# Patient Record
Sex: Female | Born: 1955 | Race: White | Hispanic: No | Marital: Married | Smoking: Former smoker
Health system: Southern US, Community
[De-identification: ages and names within clinical notes are randomized; demographics above are authoritative.]

## PROBLEM LIST (undated history)

## (undated) DIAGNOSIS — A498 Other bacterial infections of unspecified site: Secondary | ICD-10-CM

## (undated) DIAGNOSIS — F32A Depression, unspecified: Secondary | ICD-10-CM

## (undated) DIAGNOSIS — E7800 Pure hypercholesterolemia, unspecified: Secondary | ICD-10-CM

## (undated) DIAGNOSIS — A0472 Enterocolitis due to Clostridium difficile, not specified as recurrent: Secondary | ICD-10-CM

## (undated) DIAGNOSIS — K279 Peptic ulcer, site unspecified, unspecified as acute or chronic, without hemorrhage or perforation: Secondary | ICD-10-CM

## (undated) DIAGNOSIS — K259 Gastric ulcer, unspecified as acute or chronic, without hemorrhage or perforation: Secondary | ICD-10-CM

## (undated) DIAGNOSIS — D696 Thrombocytopenia, unspecified: Secondary | ICD-10-CM

## (undated) DIAGNOSIS — F410 Panic disorder [episodic paroxysmal anxiety] without agoraphobia: Secondary | ICD-10-CM

## (undated) DIAGNOSIS — K219 Gastro-esophageal reflux disease without esophagitis: Secondary | ICD-10-CM

## (undated) DIAGNOSIS — F329 Major depressive disorder, single episode, unspecified: Secondary | ICD-10-CM

## (undated) DIAGNOSIS — K311 Adult hypertrophic pyloric stenosis: Secondary | ICD-10-CM

## (undated) DIAGNOSIS — K5790 Diverticulosis of intestine, part unspecified, without perforation or abscess without bleeding: Secondary | ICD-10-CM

## (undated) DIAGNOSIS — E669 Obesity, unspecified: Secondary | ICD-10-CM

## (undated) HISTORY — DX: Depression, unspecified: F32.A

## (undated) HISTORY — DX: Panic disorder (episodic paroxysmal anxiety): F41.0

## (undated) HISTORY — DX: Obesity, unspecified: E66.9

## (undated) HISTORY — PX: COLONOSCOPY: SHX174

## (undated) HISTORY — DX: Other bacterial infections of unspecified site: A49.8

## (undated) HISTORY — DX: Gastric ulcer, unspecified as acute or chronic, without hemorrhage or perforation: K25.9

## (undated) HISTORY — DX: Gastro-esophageal reflux disease without esophagitis: K21.9

## (undated) HISTORY — DX: Adult hypertrophic pyloric stenosis: K31.1

## (undated) HISTORY — DX: Pure hypercholesterolemia, unspecified: E78.00

## (undated) HISTORY — DX: Diverticulosis of intestine, part unspecified, without perforation or abscess without bleeding: K57.90

## (undated) HISTORY — PX: RHINOPLASTY: SHX2354

## (undated) HISTORY — DX: Major depressive disorder, single episode, unspecified: F32.9

---

## 2004-07-09 LAB — CONVERTED CEMR LAB: Pap Smear: NORMAL

## 2004-08-30 ENCOUNTER — Ambulatory Visit: Payer: Self-pay | Admitting: Internal Medicine

## 2005-09-17 ENCOUNTER — Ambulatory Visit: Payer: Self-pay | Admitting: Internal Medicine

## 2005-09-26 ENCOUNTER — Ambulatory Visit: Payer: Self-pay | Admitting: Internal Medicine

## 2006-03-28 ENCOUNTER — Ambulatory Visit: Payer: Self-pay | Admitting: Internal Medicine

## 2006-07-30 ENCOUNTER — Ambulatory Visit: Payer: Self-pay | Admitting: Family Medicine

## 2006-07-30 DIAGNOSIS — J309 Allergic rhinitis, unspecified: Secondary | ICD-10-CM | POA: Insufficient documentation

## 2006-07-30 DIAGNOSIS — Z720 Tobacco use: Secondary | ICD-10-CM | POA: Insufficient documentation

## 2006-07-30 DIAGNOSIS — F419 Anxiety disorder, unspecified: Secondary | ICD-10-CM | POA: Insufficient documentation

## 2006-07-30 DIAGNOSIS — F329 Major depressive disorder, single episode, unspecified: Secondary | ICD-10-CM | POA: Insufficient documentation

## 2006-07-30 DIAGNOSIS — F17200 Nicotine dependence, unspecified, uncomplicated: Secondary | ICD-10-CM | POA: Insufficient documentation

## 2006-07-30 DIAGNOSIS — E785 Hyperlipidemia, unspecified: Secondary | ICD-10-CM | POA: Insufficient documentation

## 2006-07-30 LAB — CONVERTED CEMR LAB
Cholesterol, target level: 200 mg/dL
HDL goal, serum: 40 mg/dL
LDL Goal: 160 mg/dL

## 2006-08-01 ENCOUNTER — Encounter: Payer: Self-pay | Admitting: Family Medicine

## 2006-08-01 LAB — CONVERTED CEMR LAB
ALT: 32 units/L (ref 0–40)
AST: 28 units/L (ref 0–37)
Alkaline Phosphatase: 85 units/L (ref 39–117)
BUN: 8 mg/dL (ref 6–23)
CO2: 31 meq/L (ref 19–32)
Calcium: 9.6 mg/dL (ref 8.4–10.5)
Chloride: 106 meq/L (ref 96–112)
Creatinine, Ser: 0.8 mg/dL (ref 0.4–1.2)
GFR calc Af Amer: 98 mL/min
Total Protein: 6.5 g/dL (ref 6.0–8.3)
Triglycerides: 218 mg/dL (ref 0–149)

## 2006-09-30 ENCOUNTER — Telehealth (INDEPENDENT_AMBULATORY_CARE_PROVIDER_SITE_OTHER): Payer: Self-pay | Admitting: *Deleted

## 2006-10-14 ENCOUNTER — Encounter: Payer: Self-pay | Admitting: Family Medicine

## 2006-10-14 DIAGNOSIS — M949 Disorder of cartilage, unspecified: Secondary | ICD-10-CM

## 2006-10-14 DIAGNOSIS — M899 Disorder of bone, unspecified: Secondary | ICD-10-CM | POA: Insufficient documentation

## 2006-11-17 ENCOUNTER — Other Ambulatory Visit: Admission: RE | Admit: 2006-11-17 | Discharge: 2006-11-17 | Payer: Self-pay | Admitting: Family Medicine

## 2006-11-17 ENCOUNTER — Encounter: Payer: Self-pay | Admitting: Family Medicine

## 2006-11-17 ENCOUNTER — Ambulatory Visit: Payer: Self-pay | Admitting: Family Medicine

## 2006-11-18 LAB — CONVERTED CEMR LAB: Pap Smear: NORMAL

## 2006-11-21 ENCOUNTER — Encounter (INDEPENDENT_AMBULATORY_CARE_PROVIDER_SITE_OTHER): Payer: Self-pay | Admitting: *Deleted

## 2006-11-26 ENCOUNTER — Ambulatory Visit: Payer: Self-pay | Admitting: Obstetrics & Gynecology

## 2006-12-18 ENCOUNTER — Encounter: Payer: Self-pay | Admitting: Family Medicine

## 2006-12-18 ENCOUNTER — Ambulatory Visit: Payer: Self-pay | Admitting: Family Medicine

## 2006-12-22 ENCOUNTER — Encounter (INDEPENDENT_AMBULATORY_CARE_PROVIDER_SITE_OTHER): Payer: Self-pay | Admitting: *Deleted

## 2007-01-20 ENCOUNTER — Telehealth: Payer: Self-pay | Admitting: Family Medicine

## 2007-01-21 ENCOUNTER — Telehealth: Payer: Self-pay | Admitting: Family Medicine

## 2007-05-18 ENCOUNTER — Ambulatory Visit: Payer: Self-pay | Admitting: Family Medicine

## 2007-05-22 LAB — CONVERTED CEMR LAB
Bilirubin, Direct: 0.1 mg/dL (ref 0.0–0.3)
Calcium: 9.4 mg/dL (ref 8.4–10.5)
Cholesterol: 163 mg/dL (ref 0–200)
GFR calc Af Amer: 98 mL/min
GFR calc non Af Amer: 81 mL/min
Glucose, Bld: 87 mg/dL (ref 70–99)
LDL Cholesterol: 91 mg/dL (ref 0–99)
Sodium: 142 meq/L (ref 135–145)
Total CHOL/HDL Ratio: 4.9

## 2007-06-30 ENCOUNTER — Ambulatory Visit: Payer: Self-pay | Admitting: Family Medicine

## 2007-09-14 ENCOUNTER — Ambulatory Visit: Payer: Self-pay | Admitting: Family Medicine

## 2007-11-23 ENCOUNTER — Ambulatory Visit: Payer: Self-pay | Admitting: Family Medicine

## 2007-11-23 LAB — CONVERTED CEMR LAB: LDL Goal: 130 mg/dL

## 2007-12-08 ENCOUNTER — Ambulatory Visit: Payer: Self-pay | Admitting: Family Medicine

## 2007-12-21 ENCOUNTER — Encounter: Payer: Self-pay | Admitting: Family Medicine

## 2007-12-21 ENCOUNTER — Ambulatory Visit: Payer: Self-pay | Admitting: Family Medicine

## 2007-12-21 ENCOUNTER — Telehealth: Payer: Self-pay | Admitting: Family Medicine

## 2007-12-23 ENCOUNTER — Encounter (INDEPENDENT_AMBULATORY_CARE_PROVIDER_SITE_OTHER): Payer: Self-pay | Admitting: *Deleted

## 2007-12-23 LAB — HM MAMMOGRAPHY

## 2007-12-25 ENCOUNTER — Ambulatory Visit: Payer: Self-pay | Admitting: Family Medicine

## 2007-12-28 ENCOUNTER — Encounter: Payer: Self-pay | Admitting: Family Medicine

## 2007-12-28 ENCOUNTER — Ambulatory Visit: Payer: Self-pay | Admitting: Family Medicine

## 2007-12-31 LAB — CONVERTED CEMR LAB
ALT: 26 units/L (ref 0–35)
BUN: 7 mg/dL (ref 6–23)
CO2: 31 meq/L (ref 19–32)
Calcium: 9.4 mg/dL (ref 8.4–10.5)
Creatinine, Ser: 0.7 mg/dL (ref 0.4–1.2)
Glucose, Bld: 90 mg/dL (ref 70–99)
HDL: 31.4 mg/dL — ABNORMAL LOW (ref >39.0)
Total Protein: 6.5 g/dL (ref 6.0–8.3)
Triglycerides: 199 mg/dL — ABNORMAL HIGH (ref 0–149)

## 2008-04-25 ENCOUNTER — Ambulatory Visit: Payer: Self-pay | Admitting: Family Medicine

## 2008-05-02 LAB — CONVERTED CEMR LAB
AST: 28 units/L (ref 0–37)
Cholesterol: 191 mg/dL (ref 0–200)
Triglycerides: 219 mg/dL (ref 0–149)

## 2008-08-15 ENCOUNTER — Telehealth (INDEPENDENT_AMBULATORY_CARE_PROVIDER_SITE_OTHER): Payer: Self-pay | Admitting: *Deleted

## 2008-08-16 ENCOUNTER — Ambulatory Visit: Payer: Self-pay | Admitting: Internal Medicine

## 2008-08-16 LAB — CONVERTED CEMR LAB
Bilirubin Urine: NEGATIVE
Blood in Urine, dipstick: NEGATIVE
Glucose, Urine, Semiquant: NEGATIVE
Protein, U semiquant: NEGATIVE
pH: 7.5

## 2008-08-17 ENCOUNTER — Telehealth (INDEPENDENT_AMBULATORY_CARE_PROVIDER_SITE_OTHER): Payer: Self-pay | Admitting: *Deleted

## 2008-08-17 LAB — CONVERTED CEMR LAB
ALT: 41 units/L — ABNORMAL HIGH (ref 0–35)
AST: 44 units/L — ABNORMAL HIGH (ref 0–37)
Alkaline Phosphatase: 116 units/L (ref 39–117)
Basophils Relative: 0.2 % (ref 0.0–3.0)
Bilirubin, Direct: 0.1 mg/dL (ref 0.0–0.3)
CO2: 29 meq/L (ref 19–32)
Creatinine, Ser: 0.7 mg/dL (ref 0.4–1.2)
Eosinophils Relative: 0.8 % (ref 0.0–5.0)
Lymphocytes Relative: 18.8 % (ref 12.0–46.0)
MCV: 89.5 fL (ref 78.0–100.0)
Monocytes Absolute: 1 10*3/uL (ref 0.1–1.0)
Monocytes Relative: 10.8 % (ref 3.0–12.0)
Neutrophils Relative %: 69.4 % (ref 43.0–77.0)
Phosphorus: 2.9 mg/dL (ref 2.3–4.6)
RBC: 4.22 M/uL (ref 3.87–5.11)
Sodium: 141 meq/L (ref 135–145)
Total Protein: 7.4 g/dL (ref 6.0–8.3)
WBC: 9.5 10*3/uL (ref 4.5–10.5)

## 2008-08-22 ENCOUNTER — Ambulatory Visit: Payer: Self-pay | Admitting: Internal Medicine

## 2008-08-22 DIAGNOSIS — R945 Abnormal results of liver function studies: Secondary | ICD-10-CM | POA: Insufficient documentation

## 2008-08-24 LAB — CONVERTED CEMR LAB
Hep B C IgM: NEGATIVE
Hepatitis B Surface Ag: NEGATIVE

## 2008-08-30 LAB — CONVERTED CEMR LAB
ALT: 31 units/L (ref 0–35)
Albumin: 3.1 g/dL — ABNORMAL LOW (ref 3.5–5.2)
Total Protein: 7.2 g/dL (ref 6.0–8.3)

## 2008-09-23 ENCOUNTER — Ambulatory Visit: Payer: Self-pay | Admitting: Internal Medicine

## 2008-09-27 ENCOUNTER — Telehealth: Payer: Self-pay | Admitting: Internal Medicine

## 2008-10-06 ENCOUNTER — Telehealth: Payer: Self-pay | Admitting: Internal Medicine

## 2008-10-07 ENCOUNTER — Ambulatory Visit: Payer: Self-pay | Admitting: Internal Medicine

## 2008-12-20 ENCOUNTER — Telehealth: Payer: Self-pay | Admitting: Internal Medicine

## 2008-12-26 ENCOUNTER — Telehealth: Payer: Self-pay | Admitting: Internal Medicine

## 2008-12-27 ENCOUNTER — Encounter: Payer: Self-pay | Admitting: Obstetrics & Gynecology

## 2008-12-27 ENCOUNTER — Encounter: Payer: Self-pay | Admitting: Family Medicine

## 2008-12-27 ENCOUNTER — Ambulatory Visit: Payer: Self-pay | Admitting: Obstetrics & Gynecology

## 2009-01-04 ENCOUNTER — Encounter (INDEPENDENT_AMBULATORY_CARE_PROVIDER_SITE_OTHER): Payer: Self-pay | Admitting: *Deleted

## 2009-01-26 ENCOUNTER — Ambulatory Visit: Payer: Self-pay | Admitting: Obstetrics & Gynecology

## 2009-02-22 ENCOUNTER — Ambulatory Visit: Payer: Self-pay | Admitting: Internal Medicine

## 2009-02-22 DIAGNOSIS — K219 Gastro-esophageal reflux disease without esophagitis: Secondary | ICD-10-CM | POA: Insufficient documentation

## 2009-02-24 LAB — CONVERTED CEMR LAB
Albumin: 3.9 g/dL (ref 3.5–5.2)
BUN: 9 mg/dL (ref 6–23)
Basophils Absolute: 0.1 10*3/uL (ref 0.0–0.1)
CO2: 27 meq/L (ref 19–32)
Chloride: 105 meq/L (ref 96–112)
Cholesterol: 192 mg/dL (ref 0–200)
Creatinine, Ser: 0.8 mg/dL (ref 0.4–1.2)
Eosinophils Absolute: 0.1 10*3/uL (ref 0.0–0.7)
HCT: 41.7 % (ref 36.0–46.0)
HDL: 36.6 mg/dL — ABNORMAL LOW (ref >39.00)
Hemoglobin: 14.3 g/dL (ref 12.0–15.0)
Lymphs Abs: 2.8 10*3/uL (ref 0.7–4.0)
MCHC: 34.2 g/dL (ref 30.0–36.0)
Monocytes Absolute: 0.6 10*3/uL (ref 0.1–1.0)
Neutro Abs: 5 10*3/uL (ref 1.4–7.7)
Platelets: 236 10*3/uL (ref 150.0–400.0)
RDW: 12.6 % (ref 11.5–14.6)
TSH: 0.87 microintl units/mL (ref 0.35–5.50)
Triglycerides: 279 mg/dL — ABNORMAL HIGH (ref 0.0–149.0)
VLDL: 55.8 mg/dL — ABNORMAL HIGH (ref 0.0–40.0)

## 2009-03-01 ENCOUNTER — Encounter (INDEPENDENT_AMBULATORY_CARE_PROVIDER_SITE_OTHER): Payer: Self-pay | Admitting: *Deleted

## 2009-03-30 ENCOUNTER — Telehealth: Payer: Self-pay | Admitting: Internal Medicine

## 2009-05-25 ENCOUNTER — Encounter (INDEPENDENT_AMBULATORY_CARE_PROVIDER_SITE_OTHER): Payer: Self-pay | Admitting: *Deleted

## 2009-05-26 ENCOUNTER — Ambulatory Visit: Payer: Self-pay | Admitting: Gastroenterology

## 2009-06-09 ENCOUNTER — Ambulatory Visit: Payer: Self-pay | Admitting: Gastroenterology

## 2009-06-09 LAB — HM COLONOSCOPY

## 2009-08-28 ENCOUNTER — Telehealth: Payer: Self-pay | Admitting: Internal Medicine

## 2009-12-07 ENCOUNTER — Ambulatory Visit: Payer: Self-pay | Admitting: Internal Medicine

## 2010-03-07 ENCOUNTER — Telehealth: Payer: Self-pay | Admitting: Internal Medicine

## 2010-04-10 NOTE — Procedures (Signed)
Summary: Colonoscopy  Patient: [name] Note: All result statuses are Final unless otherwise noted.  Tests: (1) Colonoscopy (COL)   COL Colonoscopy           DONE     [facility]     [address]     [place], [place]  [address]           COLONOSCOPY PROCEDURE REPORT           PATIENT:  Shelly Hood, Shelly Hood  MR#:  [id number]     BIRTHDATE:  December 18, 1955, 53 yrs. old  GENDER:  female           ENDOSCOPIST:  Judie Petit T. Russella Dar, MD, [facility]     Referred by:  Tillman Abide, M.D.           PROCEDURE DATE:  06/09/2009     PROCEDURE:  Colonoscopy [address]     ASA CLASS:  Class II     INDICATIONS:  1) Routine Risk Screening           MEDICATIONS:   Benadryl 25 mg IV, Fentanyl 100 mcg IV, Versed 8 mg     IV           DESCRIPTION OF PROCEDURE:   After the risks benefits and     alternatives of the procedure were thoroughly explained, informed     consent was obtained.  Digital rectal exam was performed and     revealed no abnormalities.   The LB [id number] endoscope     was introduced through the anus and advanced to the cecum, which     was identified by both the appendix and ileocecal valve, without     limitations.  The quality of the prep was excellent, using     MoviPrep.  The instrument was then slowly withdrawn as the colon     was fully examined.     <<PROCEDUREIMAGES>>           FINDINGS:  Mild diverticulosis was found in the sigmoid colon. A     normal appearing cecum, ileocecal valve, and appendiceal orifice     were identified. The ascending, hepatic flexure, transverse,     splenic flexure, descending, and rectum appeared unremarkable.     Retroflexed views in the rectum revealed no abnormalities. The     time to cecum =  3.67  minutes. The scope was then withdrawn (time     =  9  min) from the patient and the procedure completed.           COMPLICATIONS:  None           ENDOSCOPIC IMPRESSION:     1) Mild diverticulosis in the sigmoid colon        RECOMMENDATIONS:     1) High fiber diet with liberal fluid intake.     2) Continue current colorectal screening recommendations for     "routine risk" patients with a repeat colonoscopy in 10 years.           Venita Lick. Russella Dar, MD, [facility]           n.     eSIGNED:   Venita Lick. Stark at 06/09/2009 09:25 AM           Maimouna, [place], [id number]  Note: An exclamation mark (!) indicates a result that was not dispersed into the flowsheet. Document Creation Date: 06/09/2009 9:26 AM _______________________________________________________________________  (1) Order result status: Final Collection or observation date-time: 06/09/2009  09:21 Requested date-time:  Receipt date-time:  Reported date-time:  Referring Physician:   Ordering Physician: Claudette Head [phone]) Specimen Source:  Source: Launa Grill Order Number: [id number] Lab site:   Appended Document: Colonoscopy    Clinical Lists Changes  Observations: Added new observation of COLONNXTDUE: 06/2019 (06/09/2009 12:57)

## 2010-04-10 NOTE — Progress Notes (Signed)
Summary: refill request for alprazolam  Phone Note Refill Request Call back at Work Phone [phone] Message from:  Patient  Refills Requested: Medication #1:  ALPRAZOLAM 0.25 MG TABS 1 tab in AM as needed for nerves. Please send to [facility].  Initial call taken by: Lowella Petties CMA,  August 28, 2009 10:43 AM  Follow-up for Phone Call        Rx written She will need to mail it unless she has a form to fax with it Follow-up by: Cindee Salt MD,  August 28, 2009 1:08 PM  Additional Follow-up for Phone Call Additional follow up Details #1::        Spoke with patient and advised rx ready for pick-up  Additional Follow-up by: Mervin Hack CMA Duncan Dull),  August 28, 2009 3:12 PM    New/Updated Medications: ALPRAZOLAM 0.25 MG TABS (ALPRAZOLAM) 1 tab in AM as needed for nerves Prescriptions: ALPRAZOLAM 0.25 MG TABS (ALPRAZOLAM) 1 tab in AM as needed for nerves  #90 x 1   Entered and Authorized by:   Cindee Salt MD   Signed by:   Cindee Salt MD on 08/28/2009   Method used:   Print then Give to Patient   RxID:   [id number]

## 2010-04-10 NOTE — Assessment & Plan Note (Signed)
Summary: ACID REFLUX/CLE   Vital Signs:  Patient profile:   55 year old female Weight:      169 pounds BMI:     33.68 Temp:     98.4 degrees F oral Pulse rate:   60 / minute Pulse rhythm:   regular BP sitting:   104 / 64  (left arm) Cuff size:   large  Vitals Entered By: Sydell Axon LPN (December 07, 2009 2:34 PM) CC: Having acid reflux especially at night   History of Present Illness: Having more trouble with acid reflux Esp noticeable over a couple of months Notes it most at night in bed and will still bother her in the morning Actually will wake her up Has regurgitation feeling  "like something coming up" under sternum Mouth waters a lot--has to swallow  No cough No hoarseness No SOB No swallowing problems  tried pepcid and zantac without help Now on tagamet 200mg  two times a day --not helping has used omeprazole sporadically with some success  Allergies: 1)  Codeine Sulfate (Codeine Sulfate)  Past History:  Past medical, surgical, family and social histories (including risk factors) reviewed for relevance to current acute and chronic problems.  Past Medical History: Reviewed history from 02/22/2009 and no changes required. Allergic rhinitis Hyperlipidemia Anxiety Depression GERD  Past Surgical History: Reviewed history from 07/30/2006 and no changes required. 1994 rhinoplasty 2005 butterfly graft 02/2005 nasal rhinoplasty repair  MRI lumbar spine: bulging discs S/P 2 lumbar steroid injesctions one kidney smaller than the other 2007 DXA: nml  Family History: Reviewed history from 07/30/2006 and no changes required. father: died  age 52s little contact, [place] Family History of CAD Female 1st degree relative <50 mother died age 14 HTN, high chol, DM, arthritis  brother: healthy, some mental retardation  Social History: Reviewed history from 07/30/2006 and no changes required. 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   Neck:  supple, no masses, no thyromegaly, no carotid bruits, and no cervical lymphadenopathy.   Lungs:  normal respiratory effort, no intercostal retractions, no accessory muscle use, and normal breath sounds.   Heart:  normal rate, regular rhythm, no murmur, and no gallop.   Abdomen:  soft, non-tender, and no masses.   Extremities:  no edema   Impression & Recommendations:  Problem # 1:  GERD (ICD-530.81) Assessment Deteriorated  clear symptoms with insufficient Rx will have her be more aggressive with omeprazole, then back down  Her updated medication list for this problem includes:    Omeprazole 20 Mg Cpdr (Omeprazole) .Marland Kitchen... 1 by mouth two times a day as needed for acid reflux problems  Complete Medication List: 1)  Fluoxetine Hcl 40 Mg Caps (Fluoxetine hcl) .Marland Kitchen.. 1 by mouth daily 2)  Lipitor 20 Mg Tabs (Atorvastatin calcium) .Marland Kitchen.. 1 by mouth daily 3)  Alprazolam 0.25 Mg Tabs (Alprazolam) .Marland Kitchen.. 1 tab in am as needed for nerves 4)  Calcium 500 Mg Tabs (Calcium) .Marland Kitchen.. 1 by mouth daily 5)  B Complex Tabs (B complex vitamins) .Marland Kitchen.. 1 by mouth daily 6)  Omeprazole 20 Mg Cpdr (Omeprazole) .Marland Kitchen.. 1 by mouth two times a day as needed for acid reflux problems  Patient  Instructions: 1)  Please take the omeprazole 20mg  two times a day for the next 2 weeks (30 minutes before breakfast and at bedtime). If doing better  you can cut down to once at bedtime daily. After another month (6 weeks total), if your symptoms are gone, you can just use it as needed 2)  Please keep appt for physical Prescriptions: ALPRAZOLAM 0.25 MG TABS (ALPRAZOLAM) 1 tab in AM as needed for nerves  #90 x 1   Entered and Authorized by:   Cindee Salt  MD   Signed by:   Cindee Salt MD on 12/07/2009   Method used:   Print then Give to Patient   RxID:   [id number] LIPITOR 20 MG TABS (ATORVASTATIN CALCIUM) 1 by mouth daily  #90 x 3   Entered and Authorized by:   Cindee Salt MD   Signed by:   Cindee Salt MD on 12/07/2009   Method used:   Print then Give to Patient   RxID:   [id number] FLUOXETINE HCL 40 MG CAPS (FLUOXETINE HCL) 1 by mouth daily  #90 x 3   Entered and Authorized by:   Cindee Salt MD   Signed by:   Cindee Salt MD on 12/07/2009   Method used:   Print then Give to Patient   RxID:   [id number] OMEPRAZOLE 20 MG CPDR (OMEPRAZOLE) 1 by mouth two times a day as needed for acid reflux problems  #60 x 3   Entered and Authorized by:   Cindee Salt MD   Signed by:   Cindee Salt MD on 12/07/2009   Method used:   Electronically to        [facility] [address] (retail)       [address], [address]       [place], [place]  [address]       Ph: [phone]       Fax: [phone]   RxID:   [id number]   Current Allergies (reviewed today): CODEINE SULFATE (CODEINE SULFATE)

## 2010-04-10 NOTE — Miscellaneous (Signed)
Summary: [facility]  Clinical Lists Changes  Medications: Added new medication of MOVIPREP 100 GM  SOLR (PEG-KCL-NACL-NASULF-NA ASC-C) As per prep instructions. - Signed Rx of MOVIPREP 100 GM  SOLR (PEG-KCL-NACL-NASULF-NA ASC-C) As per prep instructions.;  #1 x 0;  Signed;  Entered by: Ezra Sites RN;  Authorized by: Meryl Dare MD [facility];  Method used: Electronically to [facility] [address], [place], [place], [place]  [address], Ph: [phone], Fax: [phone] Observations: Added new observation of ALLERGY REV: Done (05/26/2009 8:03)    Prescriptions: MOVIPREP 100 GM  SOLR (PEG-KCL-NACL-NASULF-NA ASC-C) As per prep instructions.  #1 x 0   Entered by:   Ezra Sites RN   Authorized by:   Meryl Dare MD [facility]   Signed by:   Ezra Sites RN on 05/26/2009   Method used:   Electronically to        [facility] [address] (retail)       [address]       [place], [place]  [address]       Ph: [phone]       Fax: [phone]   RxID:   [id number]

## 2010-04-10 NOTE — Progress Notes (Signed)
Summary: Alprazolam refill  Phone Note Refill Request Call back at [phone] Message from:  Patient on March 30, 2009 8:54 AM  Refills Requested: Medication #1:  ALPRAZOLAM 0.25 MG TABS 1 tab in AM as needed for nerves. Pt request refill for Alprazolam 0.25mg  be faxed to [facility]. Please advise.    Method Requested: Fax to Mail Away Pharmacy Initial call taken by: Lewanda Rife LPN,  March 30, 2009 8:54 AM Caller: Patient Call For: Cindee Salt MD  Follow-up for Phone Call        Rx written Follow-up by: Cindee Salt MD,  March 30, 2009 12:58 PM  Additional Follow-up for Phone Call Additional follow up Details #1::        left message at work number that rx ready for pick-up. Additional Follow-up by: Mervin Hack CMA Duncan Dull),  March 30, 2009 3:14 PM    New/Updated Medications: ALPRAZOLAM 0.25 MG TABS (ALPRAZOLAM) 1 tab in AM as needed for nerves Prescriptions: ALPRAZOLAM 0.25 MG TABS (ALPRAZOLAM) 1 tab in AM as needed for nerves  #90 x 1   Entered and Authorized by:   Cindee Salt MD   Signed by:   Cindee Salt MD on 03/30/2009   Method used:   Print then Give to Patient   RxID:   [id number]

## 2010-04-10 NOTE — Letter (Signed)
Summary: [facility] Instructions  [facility]  [address] [place], [place] [address]   Phone: [phone]  Fax: [phone]       LATRESA GASSER    [birth year]    MRN: [id number]        Procedure Day /Date:  Friday 06/09/2009     Arrival Time: 8:30 am      Procedure Time: 9:30 am     Location of Procedure:                    _x _  [facility] (4th Floor)                        PREPARATION FOR COLONOSCOPY WITH MOVIPREP   Starting 5 days prior to your procedure Sunday 3/27 do not eat nuts, seeds, popcorn, corn, beans, peas,  salads, or any raw vegetables.  Do not take any fiber supplements (e.g. Metamucil, Citrucel, and Benefiber).  THE DAY BEFORE YOUR PROCEDURE         DATE: Thursday 3/31  1.  Drink clear liquids the entire day-NO SOLID FOOD  2.  Do not drink anything colored red or purple.  Avoid juices with pulp.  No orange juice.  3.  Drink at least 64 oz. (8 glasses) of fluid/clear liquids during the day to prevent dehydration and help the prep work efficiently.  CLEAR LIQUIDS INCLUDE: Water Jello Ice Popsicles Tea (sugar ok, no milk/cream) Powdered fruit flavored drinks Coffee (sugar ok, no milk/cream) Gatorade Juice: apple, white grape, white cranberry  Lemonade Clear bullion, consomm, broth Carbonated beverages (any kind) Strained chicken noodle soup Hard Candy                             4.  In the morning, mix first dose of MoviPrep solution:    Empty 1 Pouch A and 1 Pouch B into the disposable container    Add lukewarm drinking water to the top line of the container. Mix to dissolve    Refrigerate (mixed solution should be used within 24 hrs)  5.  Begin drinking the prep at 5:00 p.m. The MoviPrep container is divided by 4 marks.   Every 15 minutes drink the solution down to the next mark (approximately 8 oz) until the full liter is complete.   6.  Follow completed prep with 16 oz of clear liquid of your choice (Nothing  red or purple).  Continue to drink clear liquids until bedtime.  7.  Before going to bed, mix second dose of MoviPrep solution:    Empty 1 Pouch A and 1 Pouch B into the disposable container    Add lukewarm drinking water to the top line of the container. Mix to dissolve    Refrigerate  THE DAY OF YOUR PROCEDURE      DATE: Friday 4/1  Beginning at 4:30 a.m. (5 hours before procedure):         1. Every 15 minutes, drink the solution down to the next mark (approx 8 oz) until the full liter is complete.  2. Follow completed prep with 16 oz. of clear liquid of your choice.    3. You may drink clear liquids until 7:30 am (2 HOURS BEFORE PROCEDURE).   MEDICATION INSTRUCTIONS  Unless otherwise instructed, you should take regular prescription medications with a small sip of water   as early as possible the morning of  your procedure.          OTHER INSTRUCTIONS  You will need a responsible adult at least 53 years of age to accompany you and drive you home.   This person must remain in the waiting room during your procedure.  Wear loose fitting clothing that is easily removed.  Leave jewelry and other valuables at home.  However, you may wish to bring a book to read or  an iPod/MP3 player to listen to music as you wait for your procedure to start.  Remove all body piercing jewelry and leave at home.  Total time from sign-in until discharge is approximately 2-3 hours.  You should go home directly after your procedure and rest.  You can resume normal activities the  day after your procedure.  The day of your procedure you should not:   Drive   Make legal decisions   Operate machinery   Drink alcohol   Return to work  You will receive specific instructions about eating, activities and medications before you leave.    The above instructions have been reviewed and explained to me by   Ezra Sites RN  May 26, 2009 8:40 AM     I fully understand and can verbalize  these instructions _____________________________ Date _________

## 2010-04-12 NOTE — Progress Notes (Signed)
Summary: omeprazole   Phone Note Refill Request Call back at Work Phone [phone] Message from:  Patient on March 07, 2010 3:13 PM  Refills Requested: Medication #1:  OMEPRAZOLE 20 MG CPDR 1 by mouth two times a day as needed for acid reflux problems. Patient is asking for a written script so that she can go through mail order pharmacy. She says that she still has 3 refill remaining, but doesn[facility] because [facility] too [facility] and she is new to [facility]. Please call patient when rx is ready.  Initial call taken by: Melody Comas,  March 07, 2010 3:15 PM  Follow-up for Phone Call        Rx written Follow-up by: Cindee Salt MD,  March 08, 2010 1:13 PM  Additional Follow-up for Phone Call Additional follow up Details #1::        Spoke with patient and advised rx ready for pick-up  Additional Follow-up by: Mervin Hack CMA Duncan Dull),  March 08, 2010 1:28 PM    Prescriptions: OMEPRAZOLE 20 MG CPDR (OMEPRAZOLE) 1 by mouth two times a day as needed for acid reflux problems  #180 x 3   Entered and Authorized by:   Cindee Salt MD   Signed by:   Cindee Salt MD on 03/08/2010   Method used:   Print then Give to Patient   RxID:   [id number]

## 2010-04-16 ENCOUNTER — Other Ambulatory Visit: Payer: Self-pay | Admitting: Internal Medicine

## 2010-04-16 ENCOUNTER — Encounter: Payer: Self-pay | Admitting: Internal Medicine

## 2010-04-16 ENCOUNTER — Encounter (INDEPENDENT_AMBULATORY_CARE_PROVIDER_SITE_OTHER): Payer: BC Managed Care – PPO | Admitting: Internal Medicine

## 2010-04-16 DIAGNOSIS — E785 Hyperlipidemia, unspecified: Secondary | ICD-10-CM

## 2010-04-16 DIAGNOSIS — Z0000 Encounter for general adult medical examination without abnormal findings: Secondary | ICD-10-CM

## 2010-04-16 DIAGNOSIS — K219 Gastro-esophageal reflux disease without esophagitis: Secondary | ICD-10-CM

## 2010-04-17 LAB — CBC WITH DIFFERENTIAL/PLATELET
Basophils Absolute: 0.1 10*3/uL (ref 0.0–0.1)
Basophils Relative: 1.4 % (ref 0.0–3.0)
Eosinophils Absolute: 0.2 10*3/uL (ref 0.0–0.7)
Hemoglobin: 14 g/dL (ref 12.0–15.0)
Lymphocytes Relative: 37.3 % (ref 12.0–46.0)
Lymphs Abs: 3.2 10*3/uL (ref 0.7–4.0)
MCHC: 34.8 g/dL (ref 30.0–36.0)
MCV: 90 fl (ref 78.0–100.0)
Monocytes Absolute: 0.8 10*3/uL (ref 0.1–1.0)
Neutro Abs: 4.2 10*3/uL (ref 1.4–7.7)
RDW: 13.3 % (ref 11.5–14.6)

## 2010-04-17 LAB — HEPATIC FUNCTION PANEL
ALT: 23 U/L (ref 0–35)
Alkaline Phosphatase: 113 U/L (ref 39–117)
Bilirubin, Direct: 0.1 mg/dL (ref 0.0–0.3)
Total Bilirubin: 0.1 mg/dL — ABNORMAL LOW (ref 0.3–1.2)
Total Protein: 6.8 g/dL (ref 6.0–8.3)

## 2010-04-17 LAB — RENAL FUNCTION PANEL
Albumin: 3.9 g/dL (ref 3.5–5.2)
BUN: 10 mg/dL (ref 6–23)
Calcium: 9.1 mg/dL (ref 8.4–10.5)
Chloride: 104 mEq/L (ref 96–112)
Phosphorus: 4 mg/dL (ref 2.3–4.6)

## 2010-04-17 LAB — LIPID PANEL: VLDL: 56.8 mg/dL — ABNORMAL HIGH (ref 0.0–40.0)

## 2010-04-26 NOTE — Assessment & Plan Note (Signed)
Summary: CPE/CLE   Vital Signs:  Patient profile:   55 year old female Weight:      169 pounds Temp:     98.9 degrees F oral Pulse rate:   70 / minute Pulse rhythm:   regular BP sitting:   115 / 59  (left arm) Cuff size:   regular  Vitals Entered By: Mervin Hack CMA Duncan Dull) (April 16, 2010 3:15 PM) CC: adult physical   History of Present Illness: DOing okay Stomach has calmed down. Took the med two times a day for a while, then able to go back to daily   Mood has been fine stable on fluoxetine--plans to continue   Allergies: 1)  Codeine Sulfate (Codeine Sulfate)  Past History:  Past medical, surgical, family and social histories (including risk factors) reviewed for relevance to current acute and chronic problems.  Past Medical History: Reviewed history from 02/22/2009 and no changes required. Allergic rhinitis Hyperlipidemia Anxiety Depression GERD  Past Surgical History: Reviewed history from 07/30/2006 and no changes required. 1994 rhinoplasty 2005 butterfly graft 02/2005 nasal rhinoplasty repair  MRI lumbar spine: bulging discs S/P 2 lumbar steroid injesctions one kidney smaller than the other 2007 DXA: nml  Family History: Reviewed history from 07/30/2006 and no changes required. father: died  age 15s little contact, [place] Family History of CAD Female 1st degree relative <50 mother died age 24 HTN, high chol, DM, arthritis  brother: healthy, some mental retardation  Social History: Reviewed history from 07/30/2006 and no changes required. 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 Gets out with her weight is stable sleeps well Wears seat belt. Eyes:  Denies double vision and vision loss-1  eye. ENT:  Denies decreased hearing and ringing in ears; teeth okay---regular with dentist. CV:  Denies chest pain or discomfort, difficulty breathing at night, difficulty breathing while lying down, fainting, lightheadness, palpitations, and shortness of breath with exertion. Resp:  Denies cough and shortness of breath. GI:  Denies indigestion, nausea, and vomiting; reflux controlled. GU:  Complains of urinary frequency; denies nocturia; no sexual problems Sees Dr Marice Potter for gyn exam--- keeps up with pap smears and mammograms. MS:  Denies joint pain and joint swelling. Derm:  Complains of lesion(s); denies rash; skin tag under arm--not bothering her. Neuro:  Denies headaches, numbness, tingling, and weakness. Psych:  Denies anxiety and depression; mood is controlled still uses alprazolam in AM before work. Heme:  Denies abnormal bruising and enlarge lymph nodes. Allergy:  Denies seasonal allergies and sneezing.  Physical Exam  General:  alert and normal appearance.   Eyes:  pupils equal, pupils round, pupils reactive to light, and no optic disk abnormalities.   Ears:  R ear normal and L ear normal.   Mouth:  no erythema and no exudates.   Neck:  supple, no masses, no thyromegaly, no carotid bruits, and no cervical lymphadenopathy.   Lungs:  normal respiratory effort, no intercostal retractions, no accessory muscle use, and normal breath sounds.   Heart:  normal rate, regular rhythm, no murmur, and no gallop.   Abdomen:  soft, non-tender, and no masses.   Msk:  no joint tenderness and no joint swelling.   Pulses:  1+ in feet Extremities:  no edema Neurologic:  alert &  oriented X3, strength normal in all extremities, and gait normal.   Skin:  no rashes and no suspicious lesions.   Axillary Nodes:  No palpable lymphadenopathy Psych:  normally interactive, good eye contact, not anxious appearing, and not depressed appearing.     Impression & Recommendations:  Problem # 1:  WELL WOMAN  (ICD-V70.0) Assessment Comment Only up to date with colonoscopy gets mammo and pap at gyn discussed fitness  Problem # 2:  DEPRESSION (ICD-311) Assessment: Unchanged stable on meds  Her updated medication list for this problem includes:    Fluoxetine Hcl 40 Mg Caps (Fluoxetine hcl) .Marland Kitchen... 1 by mouth daily    Alprazolam 0.25 Mg Tabs (Alprazolam) .Marland Kitchen... 1 tab in am as needed for nerves  Problem # 3:  GERD (ICD-530.81) Assessment: Improved  can take the med daily and use two times a day only if worsens  Her updated medication list for this problem includes:    Omeprazole 20 Mg Cpdr (Omeprazole) .Marland Kitchen... 1 by mouth two times a day as needed for acid reflux problems  Orders: TLB-Renal Function Panel (80069-RENAL) TLB-CBC Platelet - w/Differential (85025-CBCD) TLB-TSH (Thyroid Stimulating Hormone) (84443-TSH)  Problem # 4:  HYPERLIPIDEMIA (ICD-272.4) Assessment: Unchanged  no problems with meds due for labs  Her updated medication list for this problem includes:    Lipitor 20 Mg Tabs (Atorvastatin calcium) .Marland Kitchen... 1 by mouth daily  Labs Reviewed: SGOT: 23 (02/22/2009)   SGPT: 32 (02/22/2009)  Lipid Goals: Chol Goal: 200 (07/30/2006)   HDL Goal: 40 (07/30/2006)   LDL Goal: 130 (11/23/2007)   TG Goal: 150 (07/30/2006)  Prior 10 Yr Risk Heart Disease: 8 % (11/23/2007)   HDL:36.60 (02/22/2009), 36.4 (04/25/2008)  LDL:DEL (04/25/2008), 92 (04/54/0981)  Chol:192 (02/22/2009), 191 (04/25/2008)  Trig:279.0 (02/22/2009), 219 (04/25/2008)  Orders: TLB-Lipid Panel (80061-LIPID) TLB-Hepatic/Liver Function Pnl (80076-HEPATIC) Venipuncture (19147)  Complete Medication List: 1)  Fluoxetine Hcl 40 Mg Caps (Fluoxetine hcl) .Marland Kitchen.. 1 by mouth daily 2)  Lipitor 20 Mg Tabs (Atorvastatin calcium) .Marland Kitchen.. 1 by mouth daily 3)  Alprazolam 0.25 Mg Tabs (Alprazolam) .Marland Kitchen.. 1 tab in am as needed for nerves 4)  Calcium 500 Mg Tabs (Calcium) .Marland Kitchen.. 1 by mouth daily 5)  Omeprazole 20 Mg Cpdr (Omeprazole) .Marland Kitchen.. 1 by  mouth two times a day as needed for acid reflux problems 6)  Vitamin D 1000 Unit Tabs (Cholecalciferol) .Marland Kitchen.. 1 tab daily for vitamin d supplement  Patient Instructions: 1)  Please schedule a follow-up appointment in 1 year.  Prescriptions: ALPRAZOLAM 0.25 MG TABS (ALPRAZOLAM) 1 tab in AM as needed for nerves  #90 x 1   Entered and Authorized by:   Cindee Salt MD   Signed by:   Cindee Salt MD on 04/16/2010   Method used:   Print then Give to Patient   RxID:   [id number]    Orders Added: 1)  TLB-Lipid Panel [80061-LIPID] 2)  TLB-Hepatic/Liver Function Pnl [80076-HEPATIC] 3)  Venipuncture [96295] 4)  TLB-Renal Function Panel [80069-RENAL] 5)  TLB-CBC Platelet - w/Differential [85025-CBCD] 6)  TLB-TSH (Thyroid Stimulating Hormone) [84443-TSH] 7)  Est. Patient 40-64 years [28413]    Current Allergies (reviewed today): CODEINE SULFATE (CODEINE SULFATE)

## 2010-07-24 NOTE — Assessment & Plan Note (Signed)
Shelly Hood, Shelly Hood               ACCOUNT NO.:  [id number]   MEDICAL RECORD NO.:  [id number]          PATIENT TYPE:  POB   LOCATION:  [facility] at [facility]         FACILITY:  [facility]   PHYSICIAN:  Allie Bossier, MD        DATE OF BIRTH:  [birth year]   DATE OF SERVICE:  12/27/2008                                  CLINIC NOTE   IDENTIFICATION:  Shelly Hood is a 55 year old, married white gravida 2,  para 2 with 2 grandchildren.  She comes in here for annual exam.  Her  complaint is that of a 64-month history of some discomfort beneath her  left breast.  This all started after she had a cold in May and she had  an x-ray done at that time which showed no abnormalities.  The  discomfort is not enough for her to take any ibuprofen or Tylenol, but  just feels weird.   PAST MEDICAL HISTORY:  She is overweight.  She has high lipids and panic  attacks.   REVIEW OF SYSTEMS:  She sees her family doctor is Dr. Alphonsus Sias.  She has  been married to 51 years older husband, who is 19 years old.  They are  occasionally sexually active, but rarely.  Her Pap smear was last done  in 2008.  Her mammogram was done in October 2009 and she has never had a  colonoscopy.   PAST SURGICAL HISTORY:  Rhinoplasty x3.   ALLERGIES:  No known drug allergies.  No to LATEX allergies.   SOCIAL HISTORY:  She smokes about half a pack a day for more than 20  years.  She denies alcohol or drug use.   MEDICATIONS:  Lipitor daily, Prozac daily, Xanax daily.  Please note,  she has been on the Prozac for the last 20 years.   PHYSICAL EXAMINATION:  VITAL SIGNS:  Weight 172, height 4 feet 11  inches, blood pressure 120/72, pulse 84.  HEENT:  Normal.  HEART:  Regular rate and rhythm.  LUNGS:  Clear to auscultation bilaterally.  BREASTS:  Normal bilaterally.  No skin changes, nipple discharge,  masses, or lymphadenopathy.  ABDOMEN:  Obese, benign.  No hepatosplenomegaly that is palpable.  EXTERNAL GENITALIA:  She has a paucity of  pubic hair (she has noticed  thinning since menopause in 2001).  There is moderate amount of atrophy.  SPECULUM:  Normal cervix and normal vagina.  A Pap smear was obtained.  BIMANUAL:  Normal size and shape midplane uterus and nonenlarged adnexa.   ASSESSMENT AND PLAN:  1. Annual exam.  I have checked a Pap smear and recommended self-      breast and self-vulvar exams.  2. Breast discomfort.  I believe that this is a bony due to a bony      issue with her ribs, and I have suggested that she visit her      chiropractor.  3. Ten-pound weight gain in the last 2 years.  I am checking her TSH      today.  4. I scheduled her for a GI referral, so that she can have her      screening colonoscopy and I  am scheduling a mammogram.      Allie Bossier, MD     MCD/MEDQ  D:  12/27/2008  T:  12/28/2008  Job:  [id number]

## 2010-08-14 ENCOUNTER — Ambulatory Visit: Payer: BC Managed Care – PPO | Admitting: Family Medicine

## 2010-11-26 ENCOUNTER — Telehealth: Payer: Self-pay | Admitting: *Deleted

## 2010-11-26 DIAGNOSIS — Z1231 Encounter for screening mammogram for malignant neoplasm of breast: Secondary | ICD-10-CM

## 2010-11-26 NOTE — Telephone Encounter (Signed)
Patient wishes to have mammogram done at [facility].

## 2010-11-27 ENCOUNTER — Other Ambulatory Visit: Payer: Self-pay | Admitting: *Deleted

## 2010-11-27 MED ORDER — ATORVASTATIN CALCIUM 20 MG PO TABS: 20.0000 mg | ORAL_TABLET | Freq: Every day | ORAL | Status: DC

## 2010-11-27 MED ORDER — FLUOXETINE HCL 40 MG PO CAPS: 40.0000 mg | ORAL_CAPSULE | Freq: Every day | ORAL | Status: DC

## 2010-11-27 NOTE — Telephone Encounter (Signed)
rx sent to pharmacy by e-script/ [facility]

## 2010-12-12 ENCOUNTER — Other Ambulatory Visit: Payer: Self-pay | Admitting: *Deleted

## 2010-12-12 NOTE — Telephone Encounter (Signed)
Phoned request from pt, she is asking for a 90 day supply.  I told her I didn't know if we can do that for xanax, but she says that's what she has always gotten.  She is asking for a written script to pick up and send to [facility].  Please call when ready.  Dr. Alphonsus Sias is out of the office.

## 2010-12-13 MED ORDER — ALPRAZOLAM 0.25 MG PO TABS: ORAL_TABLET | ORAL | Status: DC

## 2010-12-13 NOTE — Telephone Encounter (Signed)
Patient advised Rx ready for pick up will be left at front desk. 

## 2010-12-27 ENCOUNTER — Encounter: Payer: Self-pay | Admitting: Obstetrics & Gynecology

## 2010-12-27 ENCOUNTER — Ambulatory Visit (INDEPENDENT_AMBULATORY_CARE_PROVIDER_SITE_OTHER): Payer: BC Managed Care – PPO | Admitting: Obstetrics & Gynecology

## 2010-12-27 VITALS — BP 123/60 | HR 67 | Ht 59.0 in | Wt 171.0 lb

## 2010-12-27 DIAGNOSIS — Z01419 Encounter for gynecological examination (general) (routine) without abnormal findings: Secondary | ICD-10-CM

## 2010-12-27 DIAGNOSIS — Z23 Encounter for immunization: Secondary | ICD-10-CM

## 2010-12-27 DIAGNOSIS — Z0000 Encounter for general adult medical examination without abnormal findings: Secondary | ICD-10-CM

## 2010-12-27 DIAGNOSIS — Z1272 Encounter for screening for malignant neoplasm of vagina: Secondary | ICD-10-CM

## 2010-12-27 NOTE — Progress Notes (Signed)
  Subjective:    Patient ID: Shelly Hood, female    DOB: October 13, 1955, 55 y.o.   MRN: [id number]  HPI    Review of Systems     Objective:   Physical Exam        Assessment & Plan:   Subjective:    Shelly Hood is a 55 y.o. female who presents for annual exam. The patient has no complaints today. The patient is sexually active. GYN screening history: last pap: was normal. The patient is not taking hormone replacement therapy. Patient denies post-menopausal vaginal bleeding.. The patient wears seatbelts: yes. The patient participates in regular exercise: yes. Has the patient ever been transfused or tattooed?: no. The patient reports that there is not domestic violence in her life.   Menstrual History: OB History    Grav Para Term Preterm Abortions TAB SAB Ect Mult Living                  Menarche age: 3 No LMP recorded. Patient is postmenopausal.    The following portions of the patient's history were reviewed and updated as appropriate: allergies, current medications, past family history, past medical history, past social history, past surgical history and problem list.  Review of Systems A comprehensive review of systems was negative.  Her husband is 18 years olders. She is a Investment banker, corporate   Objective:    BP 123/60  Pulse 67  Ht 4\' 11"  (1.499 m)  Wt 171 lb (77.565 kg)  BMI 34.54 kg/m2  General Appearance:    Alert, cooperative, no distress, appears stated age  Head:    Normocephalic, without obvious abnormality, atraumatic  Eyes:    PERRL, conjunctiva/corneas clear, EOM's intact, fundi    benign, both eyes  Ears:    Normal TM's and external ear canals, both ears  Nose:   Nares normal, septum midline, mucosa normal, no drainage    or sinus tenderness  Throat:   Lips, mucosa, and tongue normal; teeth and gums normal  Neck:   Supple, symmetrical, trachea midline, no adenopathy;    thyroid:  no enlargement/tenderness/nodules; no carotid   bruit or JVD  Back:      Symmetric, no curvature, ROM normal, no CVA tenderness  Lungs:     Clear to auscultation bilaterally, respirations unlabored  Chest Wall:    No tenderness or deformity   Heart:    Regular rate and rhythm, S1 and S2 normal, no murmur, rub   or gallop  Breast Exam:    No tenderness, masses, or nipple abnormality  Abdomen:     Soft, non-tender, bowel sounds active all four quadrants,    no masses, no organomegaly  Genitalia:    Normal female without lesion, discharge or tenderness, no adnexal masses, uterus NSSA, NT     Extremities:   Extremities normal, atraumatic, no cyanosis or edema  Pulses:   2+ and symmetric all extremities  Skin:   Skin color, texture, turgor normal, no rashes or lesions  Lymph nodes:   Cervical, supraclavicular, and axillary nodes normal  Neurologic:   CNII-XII intact, normal strength, sensation and reflexes    throughout      Assessment:    Normal gyn exam    Plan:    Await pap smear results. Mammogram.  Rec SBE/SVE Flu shot today

## 2011-03-06 ENCOUNTER — Encounter: Payer: Self-pay | Admitting: Internal Medicine

## 2011-03-06 ENCOUNTER — Ambulatory Visit (INDEPENDENT_AMBULATORY_CARE_PROVIDER_SITE_OTHER): Payer: BC Managed Care – PPO | Admitting: Internal Medicine

## 2011-03-06 VITALS — BP 120/70 | HR 82 | Temp 97.9°F | Wt 167.0 lb

## 2011-03-06 DIAGNOSIS — J069 Acute upper respiratory infection, unspecified: Secondary | ICD-10-CM

## 2011-03-06 MED ORDER — AMOXICILLIN 500 MG PO TABS: 1000.0000 mg | ORAL_TABLET | Freq: Two times a day (BID) | ORAL | Status: AC

## 2011-03-06 MED ORDER — TRAMADOL HCL 50 MG PO TBDP: 50.0000 mg | ORAL_TABLET | Freq: Every evening | ORAL | Status: DC | PRN

## 2011-03-06 NOTE — Assessment & Plan Note (Signed)
May be attenuated flu or other viral illness Discussed supportive care Amoxicillin if worsens or persists Discussed cigarette cessation and gave info on 1-800 QUIT NOW

## 2011-03-06 NOTE — Progress Notes (Signed)
  Subjective:    Patient ID: Shelly Hood, female    DOB: 11/22/1955, 55 y.o.   MRN: [id number]  HPI Started with respiratory illness 5 days ago No better Head is congested Deep cough without sputum but hears a lot of noise in her chest  Some fever with night sweats No SOB but chest feels tight Some wheezing  Throat irritated from coughing but no discrete sore throat Has head stuffiness and "roaring" No ear pain  Has tried BC powders, tylenol cold and flu---not much help Worst illness she can remember Did get flu shot Hasn't smoked with this illness  Current Outpatient Prescriptions on File Prior to Visit  Medication Sig Dispense Refill  . ALPRAZolam (XANAX) 0.25 MG tablet Take one tablet in the morning as needed for nerves.  90 tablet  0  . atorvastatin (LIPITOR) 20 MG tablet Take 1 tablet (20 mg total) by mouth daily.  90 tablet  3  . FLUoxetine (PROZAC) 40 MG capsule Take 1 capsule (40 mg total) by mouth daily.  90 capsule  3    Allergies  Allergen Reactions  . Codeine Sulfate     REACTION: nausea only tolerates hydrocodone    Past Medical History  Diagnosis Date  . Panic attacks   . High cholesterol     Past Surgical History  Procedure Date  . Rhinoplasty     X3    Family History  Problem Relation Age of Onset  . Diabetes Mother   . Diabetes Maternal Grandmother     History   Social History  . Marital Status: Married    Spouse Name: N/A    Number of Children: N/A  . Years of Education: N/A   Occupational History  . Not on file.   Social History Main Topics  . Smoking status: Current Everyday Smoker -- 1.0 packs/day    Types: Cigarettes  . Smokeless tobacco: Never Used  . Alcohol Use: No  . Drug Use: No  . Sexually Active: Not Currently -- Female partner(s)   Other Topics Concern  . Not on file   Social History Narrative  . No narrative on file   Review of Systems Appetite is off No vomiting but some loose stools Some back pain      Objective:   Physical Exam  Constitutional: She appears well-developed and well-nourished. No distress.  HENT:  Mouth/Throat: Oropharynx is clear and moist. No oropharyngeal exudate.       Mild frontal tenderness Moderate nasal inflammation TMs normal  Neck: Normal range of motion. Neck supple. No thyromegaly present.  Pulmonary/Chest: Effort normal and breath sounds normal. No respiratory distress. She has no wheezes. She has no rales.  Lymphadenopathy:    She has no cervical adenopathy.          Assessment & Plan:

## 2011-03-06 NOTE — Patient Instructions (Signed)
Please start the amoxicillin antibiotic if you are not improving in the next 3-4 days

## 2011-03-07 ENCOUNTER — Ambulatory Visit: Payer: BC Managed Care – PPO | Admitting: Internal Medicine

## 2011-03-11 ENCOUNTER — Telehealth: Payer: Self-pay | Admitting: Internal Medicine

## 2011-03-11 MED ORDER — FLUCONAZOLE 150 MG PO TABS: 150.0000 mg | ORAL_TABLET | Freq: Once | ORAL | Status: DC

## 2011-03-11 NOTE — Telephone Encounter (Signed)
Called work number she was out to lunch, left message on cell phone VM, advised pt to call if any questions.

## 2011-03-11 NOTE — Telephone Encounter (Signed)
Patient called and stated the antibiotic Amoxicillin  that was Rx on the 26th of December at her appointment has given her thrush and wanted your advice on what to do.

## 2011-03-11 NOTE — Telephone Encounter (Signed)
Okay to Rx fluconazole 150mg  #2 x 0 Take one now and repeat in 1 week if persists May help to take probiotic (like yogurt with active cultures)

## 2011-04-15 ENCOUNTER — Encounter: Payer: Self-pay | Admitting: Internal Medicine

## 2011-04-15 ENCOUNTER — Ambulatory Visit (INDEPENDENT_AMBULATORY_CARE_PROVIDER_SITE_OTHER): Payer: BC Managed Care – PPO | Admitting: Internal Medicine

## 2011-04-15 VITALS — BP 110/70 | HR 73 | Temp 98.4°F | Ht 59.0 in | Wt 167.0 lb

## 2011-04-15 DIAGNOSIS — B079 Viral wart, unspecified: Secondary | ICD-10-CM

## 2011-04-15 DIAGNOSIS — E669 Obesity, unspecified: Secondary | ICD-10-CM

## 2011-04-15 DIAGNOSIS — F411 Generalized anxiety disorder: Secondary | ICD-10-CM

## 2011-04-15 DIAGNOSIS — K219 Gastro-esophageal reflux disease without esophagitis: Secondary | ICD-10-CM

## 2011-04-15 DIAGNOSIS — Z0000 Encounter for general adult medical examination without abnormal findings: Secondary | ICD-10-CM

## 2011-04-15 DIAGNOSIS — E785 Hyperlipidemia, unspecified: Secondary | ICD-10-CM

## 2011-04-15 LAB — HEPATIC FUNCTION PANEL
AST: 23 U/L (ref 0–37)
Albumin: 4 g/dL (ref 3.5–5.2)
Alkaline Phosphatase: 107 U/L (ref 39–117)

## 2011-04-15 LAB — CBC WITH DIFFERENTIAL/PLATELET
Eosinophils Relative: 1.6 % (ref 0.0–5.0)
HCT: 42.2 % (ref 36.0–46.0)
Hemoglobin: 14.6 g/dL (ref 12.0–15.0)
Lymphs Abs: 3.2 10*3/uL (ref 0.7–4.0)
Monocytes Relative: 5.8 % (ref 3.0–12.0)
Platelets: 268 10*3/uL (ref 150.0–400.0)
RBC: 4.71 Mil/uL (ref 3.87–5.11)
WBC: 11 10*3/uL — ABNORMAL HIGH (ref 4.5–10.5)

## 2011-04-15 LAB — TSH: TSH: 0.64 u[IU]/mL (ref 0.35–5.50)

## 2011-04-15 LAB — BASIC METABOLIC PANEL
BUN: 9 mg/dL (ref 6–23)
GFR: 83.27 mL/min (ref >60.00)
Potassium: 4.3 mEq/L (ref 3.5–5.1)
Sodium: 139 mEq/L (ref 135–145)

## 2011-04-15 LAB — LIPID PANEL
HDL: 37.3 mg/dL — ABNORMAL LOW (ref >39.00)
Total CHOL/HDL Ratio: 6
VLDL: 66.2 mg/dL — ABNORMAL HIGH (ref 0.0–40.0)

## 2011-04-15 MED ORDER — OMEPRAZOLE 20 MG PO CPDR: 20.0000 mg | DELAYED_RELEASE_CAPSULE | Freq: Two times a day (BID) | ORAL | Status: DC

## 2011-04-15 MED ORDER — ALPRAZOLAM 0.25 MG PO TABS: 0.2500 mg | ORAL_TABLET | Freq: Two times a day (BID) | ORAL | Status: DC | PRN

## 2011-04-15 NOTE — Assessment & Plan Note (Signed)
Fairly healthy UTD on imms and cancer screening Discussed fitness

## 2011-04-15 NOTE — Progress Notes (Signed)
Subjective:    Patient ID: Shelly Hood, female    DOB: 01/07/1956, 56 y.o.   MRN: [id number]  HPI Over the respiratory infection Did need the antibiotic "that was the worst I have ever felt"  Still smoking  Gave info on 1-800 QUIT NOW  Had gyn recently Mammogram done at [facility] in October and was fine Not in the computer though  Has a few more skin areas on face Dermatologist treated with liquid nitrogen in past Warts by derm diagnosis  Does some walking--not much in the cold weather Now has dog to walk Not overly careful with diet  Mood is fine Happy with fluoxetine---wishes to continue  Current Outpatient Prescriptions on File Prior to Visit  Medication Sig Dispense Refill  . ALPRAZolam (XANAX) 0.25 MG tablet Take one tablet in the morning as needed for nerves.  90 tablet  0  . atorvastatin (LIPITOR) 20 MG tablet Take 1 tablet (20 mg total) by mouth daily.  90 tablet  3  . FLUoxetine (PROZAC) 40 MG capsule Take 1 capsule (40 mg total) by mouth daily.  90 capsule  3    Allergies  Allergen Reactions  . Codeine Sulfate     REACTION: nausea only tolerates hydrocodone    Past Medical History  Diagnosis Date  . Panic attacks   . High cholesterol     Past Surgical History  Procedure Date  . Rhinoplasty     X3    Family History  Problem Relation Age of Onset  . Diabetes Mother   . Diabetes Maternal Grandmother     History   Social History  . Marital Status: Married    Spouse Name: N/A    Number of Children: N/A  . Years of Education: N/A   Occupational History  . Not on file.   Social History Main Topics  . Smoking status: Current Everyday Smoker -- 1.0 packs/day    Types: Cigarettes  . Smokeless tobacco: Never Used  . Alcohol Use: No  . Drug Use: No  . Sexually Active: Not Currently -- Female partner(s)   Other Topics Concern  . Not on file   Social History Narrative  . No narrative on file   Review of Systems  Constitutional:  Negative for fatigue and unexpected weight change.       Wears seat belt  HENT: Negative for hearing loss, congestion, rhinorrhea, dental problem and tinnitus.        Regular with dentist  Eyes: Negative for visual disturbance.       No diplopia or unilateral vision loss Wears contacts  Respiratory: Negative for cough, chest tightness and shortness of breath.   Cardiovascular: Negative for chest pain, palpitations and leg swelling.  Gastrointestinal: Negative for nausea, vomiting, abdominal pain, constipation and blood in stool.       Heartburn is controlled on meds  Genitourinary: Negative for dysuria, urgency, difficulty urinating and dyspareunia.       Some trouble holding urine with sneeze---not incontinent of yet  Musculoskeletal: Negative for back pain, joint swelling and arthralgias.  Skin: Negative for rash.       Skin lesions on face Dry skin Scaly area on right back---irritated by bra  Neurological: Negative for dizziness, syncope, weakness, light-headedness, numbness and headaches.  Hematological: Negative for adenopathy. Does not bruise/bleed easily.  Psychiatric/Behavioral: Negative for sleep disturbance and dysphoric mood. The patient is not nervous/anxious.        Objective:   Physical Exam  Constitutional: She is  oriented to person, place, and time. She appears well-developed and well-nourished. No distress.  HENT:  Head: Normocephalic and atraumatic.  Right Ear: External ear normal.  Left Ear: External ear normal.  Mouth/Throat: Oropharynx is clear and moist. No oropharyngeal exudate.  Eyes: Conjunctivae and EOM are normal. Pupils are equal, round, and reactive to light.  Neck: Normal range of motion. Neck supple. No thyromegaly present.  Cardiovascular: Normal rate, regular rhythm, normal heart sounds and intact distal pulses.  Exam reveals no gallop.   No murmur heard. Pulmonary/Chest: Effort normal and breath sounds normal. No respiratory distress. She has no  wheezes. She has no rales.  Abdominal: Soft. There is no tenderness.  Musculoskeletal: Normal range of motion. She exhibits no edema and no tenderness.  Lymphadenopathy:    She has no cervical adenopathy.    She has no axillary adenopathy.  Neurological: She is alert and oriented to person, place, and time.  Skin: No rash noted.       4 warty growths on forehead (~96mm) And 1 over right eyebrow 1 larger wart in midline on vertex of head  Irritated seb keratosis along right bra line  Psychiatric: She has a normal mood and affect. Her behavior is normal. Judgment and thought content normal.          Assessment & Plan:

## 2011-04-15 NOTE — Assessment & Plan Note (Signed)
Controlled on the med 

## 2011-04-15 NOTE — Assessment & Plan Note (Signed)
6 total lesions treated for ~40 seconds x 2 each Tolerated well

## 2011-04-15 NOTE — Assessment & Plan Note (Signed)
Discussed fitness and dietary restraint and focus

## 2011-04-15 NOTE — Assessment & Plan Note (Signed)
No side effects Will recheck labs 

## 2011-04-15 NOTE — Assessment & Plan Note (Signed)
Has had panic in the past Happy with the fluoxetine ---will continue

## 2011-08-30 ENCOUNTER — Other Ambulatory Visit: Payer: Self-pay | Admitting: *Deleted

## 2011-08-30 MED ORDER — ALPRAZOLAM 0.25 MG PO TABS: 0.2500 mg | ORAL_TABLET | Freq: Two times a day (BID) | ORAL | Status: DC | PRN

## 2011-08-30 NOTE — Telephone Encounter (Signed)
Form on your desk to be signed, this form can be faxed back to [facility].

## 2011-08-30 NOTE — Telephone Encounter (Signed)
rx faxed to pharmacy manually/ [facility]

## 2011-08-30 NOTE — Telephone Encounter (Signed)
Okay #180 x 0 Should say for nerves or sleep

## 2011-12-03 ENCOUNTER — Other Ambulatory Visit: Payer: Self-pay | Admitting: Internal Medicine

## 2011-12-03 ENCOUNTER — Other Ambulatory Visit: Payer: Self-pay | Admitting: *Deleted

## 2011-12-03 MED ORDER — ALPRAZOLAM 0.25 MG PO TABS: 0.2500 mg | ORAL_TABLET | Freq: Two times a day (BID) | ORAL | Status: DC | PRN

## 2011-12-03 NOTE — Telephone Encounter (Signed)
rx form faxed back to [facility]

## 2011-12-03 NOTE — Telephone Encounter (Signed)
Form on your desk to be signed and faxed back to [facility] manually.

## 2011-12-03 NOTE — Telephone Encounter (Signed)
Okay #180 x 0 Form signed 

## 2012-03-09 ENCOUNTER — Other Ambulatory Visit: Payer: Self-pay | Admitting: *Deleted

## 2012-03-09 MED ORDER — ALPRAZOLAM 0.25 MG PO TABS: 0.2500 mg | ORAL_TABLET | Freq: Two times a day (BID) | ORAL | Status: DC | PRN

## 2012-03-09 NOTE — Telephone Encounter (Signed)
Rx printed

## 2012-03-09 NOTE — Telephone Encounter (Signed)
Form on your desk to be filled out and faxed back to [facility] ( or you can just print the rx and I attach it) Please advise

## 2012-03-09 NOTE — Telephone Encounter (Signed)
rx faxed to pharmacy manually to [facility]

## 2012-04-15 ENCOUNTER — Ambulatory Visit (INDEPENDENT_AMBULATORY_CARE_PROVIDER_SITE_OTHER): Payer: BC Managed Care – PPO | Admitting: Internal Medicine

## 2012-04-15 ENCOUNTER — Encounter: Payer: Self-pay | Admitting: Internal Medicine

## 2012-04-15 VITALS — BP 100/70 | HR 69 | Temp 98.0°F | Ht 59.5 in | Wt 168.0 lb

## 2012-04-15 DIAGNOSIS — K219 Gastro-esophageal reflux disease without esophagitis: Secondary | ICD-10-CM

## 2012-04-15 DIAGNOSIS — Z0000 Encounter for general adult medical examination without abnormal findings: Secondary | ICD-10-CM

## 2012-04-15 DIAGNOSIS — F411 Generalized anxiety disorder: Secondary | ICD-10-CM

## 2012-04-15 DIAGNOSIS — E669 Obesity, unspecified: Secondary | ICD-10-CM

## 2012-04-15 DIAGNOSIS — E785 Hyperlipidemia, unspecified: Secondary | ICD-10-CM

## 2012-04-15 LAB — BASIC METABOLIC PANEL
CO2: 27 mEq/L (ref 19–32)
Calcium: 9.4 mg/dL (ref 8.4–10.5)
Potassium: 4.1 mEq/L (ref 3.5–5.1)
Sodium: 139 mEq/L (ref 135–145)

## 2012-04-15 LAB — HEPATIC FUNCTION PANEL
ALT: 23 U/L (ref 0–35)
Alkaline Phosphatase: 115 U/L (ref 39–117)
Bilirubin, Direct: 0 mg/dL (ref 0.0–0.3)
Total Protein: 7.2 g/dL (ref 6.0–8.3)

## 2012-04-15 LAB — LIPID PANEL
Total CHOL/HDL Ratio: 6
Triglycerides: 325 mg/dL — ABNORMAL HIGH (ref 0.0–149.0)

## 2012-04-15 LAB — CBC WITH DIFFERENTIAL/PLATELET
Basophils Relative: 0.7 % (ref 0.0–3.0)
Hemoglobin: 14.1 g/dL (ref 12.0–15.0)
Lymphocytes Relative: 27.8 % (ref 12.0–46.0)
MCHC: 34.2 g/dL (ref 30.0–36.0)
Monocytes Relative: 8 % (ref 3.0–12.0)
Neutro Abs: 5.1 10*3/uL (ref 1.4–7.7)
RBC: 4.75 Mil/uL (ref 3.87–5.11)

## 2012-04-15 MED ORDER — ATORVASTATIN CALCIUM 20 MG PO TABS: 20.0000 mg | ORAL_TABLET | Freq: Every day | ORAL | Status: DC

## 2012-04-15 MED ORDER — ALPRAZOLAM 0.25 MG PO TABS: 0.2500 mg | ORAL_TABLET | Freq: Two times a day (BID) | ORAL | Status: DC | PRN

## 2012-04-15 MED ORDER — FLUOXETINE HCL 40 MG PO CAPS: 40.0000 mg | ORAL_CAPSULE | Freq: Every day | ORAL | Status: DC

## 2012-04-15 MED ORDER — OMEPRAZOLE 20 MG PO CPDR: 20.0000 mg | DELAYED_RELEASE_CAPSULE | Freq: Two times a day (BID) | ORAL | Status: DC

## 2012-04-15 NOTE — Progress Notes (Signed)
Subjective:    Patient ID: Shelly Hood, female    DOB: [birth year], 57 y.o.   MRN: [id number]  HPI Here for physical Still smoking but down to 3-4 per day. Discussed that she needs a quit date.  Keeps up with Dr Marice Potter for gyn care  Has gym at work--hasn't really used it much Discussed eating right Info on DASH diet given  Some anxiety still Uses the xanax every morning--this keeps her on an even keel  Current Outpatient Prescriptions on File Prior to Visit  Medication Sig Dispense Refill  . ALPRAZolam (XANAX) 0.25 MG tablet Take 1 tablet (0.25 mg total) by mouth 2 (two) times daily as needed for sleep or anxiety.  180 tablet  0  . atorvastatin (LIPITOR) 20 MG tablet Take 1 tablet (20 mg total) by mouth daily.  90 tablet  3  . calcium-vitamin D (OSCAL WITH D) 500-200 MG-UNIT per tablet Take 1 tablet by mouth daily.      Marland Kitchen FLUoxetine (PROZAC) 40 MG capsule Take 1 capsule (40 mg total) by mouth daily.  90 capsule  3  . omeprazole (PRILOSEC) 20 MG capsule Take 1 capsule (20 mg total) by mouth 2 (two) times daily.  180 capsule  3    Allergies  Allergen Reactions  . Codeine Sulfate     REACTION: nausea only tolerates hydrocodone    Past Medical History  Diagnosis Date  . Panic attacks   . High cholesterol     Past Surgical History  Procedure Date  . Rhinoplasty     X3    Family History  Problem Relation Age of Onset  . Diabetes Mother   . Diabetes Maternal Grandmother   . Heart disease Father     History   Social History  . Marital Status: Married    Spouse Name: N/A    Number of Children: 2  . Years of Education: N/A   Occupational History  . 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 Smoking status: Current Every Day Smoker -- 1.0 packs/day    Types: Cigarettes  . Smokeless tobacco: Never Used  . Alcohol Use: No  . Drug Use: No  . Sexually Active: Not Currently -- Female partner(s)   Other Topics Concern  . Not  on file   Social History Narrative  . No narrative on file   Review of Systems  Constitutional: Negative for fatigue and unexpected weight change.       Wears seat belt  HENT: Negative for hearing loss, congestion, rhinorrhea, dental problem and tinnitus.        Regular with dentist  Eyes: Negative for visual disturbance.       No diplopia or unilateral vision loss  Respiratory: Negative for cough, chest tightness and shortness of breath.   Cardiovascular: Negative for chest pain, palpitations and leg swelling.  Gastrointestinal: Negative for nausea, vomiting, abdominal pain, constipation and blood in stool.       Heartburn controlled with omeprazole (occ flare if eats spicy or fried food)  Genitourinary: Negative for difficulty urinating and dyspareunia.       Still has hot flashes at times Some stress incontinence  Musculoskeletal: Negative for back pain, joint swelling and arthralgias.  Skin:       Still has dry skin No suspicious lesions  Neurological: Negative for dizziness, syncope, weakness, light-headedness, numbness and headaches.  Psychiatric/Behavioral: Negative for sleep disturbance and dysphoric mood. The patient is  nervous/anxious.        Objective:   Physical Exam  Constitutional: She is oriented to person, place, and time. She appears well-developed and well-nourished. No distress.  HENT:  Head: Normocephalic and atraumatic.  Right Ear: External ear normal.  Left Ear: External ear normal.  Mouth/Throat: Oropharynx is clear and moist. No oropharyngeal exudate.  Eyes: Conjunctivae normal and EOM are normal. Pupils are equal, round, and reactive to light.  Neck: Normal range of motion. Neck supple. No thyromegaly present.  Cardiovascular: Normal rate, regular rhythm, normal heart sounds and intact distal pulses.  Exam reveals no gallop.   No murmur heard. Pulmonary/Chest: Effort normal and breath sounds normal. No respiratory distress. She has no wheezes. She has  no rales.  Abdominal: Soft. There is no tenderness.  Musculoskeletal: She exhibits no edema and no tenderness.  Lymphadenopathy:    She has no cervical adenopathy.  Neurological: She is alert and oriented to person, place, and time.  Skin: Skin is dry. No rash noted. No erythema.  Psychiatric: She has a normal mood and affect. Her behavior is normal.          Assessment & Plan:

## 2012-04-15 NOTE — Assessment & Plan Note (Signed)
No problems with the med Due for labs 

## 2012-04-15 NOTE — Assessment & Plan Note (Signed)
Healthy but needs to work on fitness Discussed cigarette quit date

## 2012-04-15 NOTE — Assessment & Plan Note (Signed)
Quiet on the med 

## 2012-04-15 NOTE — Assessment & Plan Note (Signed)
Discussed DASH diet and exercise

## 2012-04-15 NOTE — Assessment & Plan Note (Signed)
Does well with qAM xanax and the fluoxetine

## 2012-04-15 NOTE — Patient Instructions (Signed)

## 2012-05-31 ENCOUNTER — Other Ambulatory Visit: Payer: Self-pay | Admitting: Internal Medicine

## 2012-08-31 ENCOUNTER — Other Ambulatory Visit: Payer: Self-pay | Admitting: Internal Medicine

## 2012-09-17 ENCOUNTER — Other Ambulatory Visit: Payer: Self-pay | Admitting: *Deleted

## 2012-09-17 NOTE — Telephone Encounter (Signed)
Please sign form to be faxed to [facility]

## 2012-09-18 MED ORDER — ALPRAZOLAM 0.25 MG PO TABS: 0.2500 mg | ORAL_TABLET | Freq: Two times a day (BID) | ORAL | Status: DC | PRN

## 2012-09-18 NOTE — Telephone Encounter (Signed)
Form faxed to [facility]

## 2012-09-18 NOTE — Telephone Encounter (Signed)
#  180 x 0 approved

## 2013-01-14 ENCOUNTER — Other Ambulatory Visit: Payer: Self-pay

## 2013-03-04 ENCOUNTER — Other Ambulatory Visit: Payer: Self-pay | Admitting: Internal Medicine

## 2013-03-05 ENCOUNTER — Other Ambulatory Visit: Payer: Self-pay | Admitting: *Deleted

## 2013-03-05 MED ORDER — ALPRAZOLAM 0.25 MG PO TABS: 0.2500 mg | ORAL_TABLET | Freq: Two times a day (BID) | ORAL | Status: DC | PRN

## 2013-03-05 NOTE — Telephone Encounter (Signed)
Left message on machine that rx is ready for pick-up, and it will be at our front desk.  

## 2013-03-05 NOTE — Telephone Encounter (Signed)
Okay #180 x 0 

## 2013-04-12 LAB — HM MAMMOGRAPHY: HM Mammogram: NORMAL

## 2013-04-16 ENCOUNTER — Encounter: Payer: BC Managed Care – PPO | Admitting: Internal Medicine

## 2013-04-26 ENCOUNTER — Ambulatory Visit (INDEPENDENT_AMBULATORY_CARE_PROVIDER_SITE_OTHER): Payer: BC Managed Care – PPO | Admitting: Obstetrics & Gynecology

## 2013-04-26 ENCOUNTER — Encounter: Payer: Self-pay | Admitting: Internal Medicine

## 2013-04-26 ENCOUNTER — Encounter: Payer: Self-pay | Admitting: Obstetrics & Gynecology

## 2013-04-26 ENCOUNTER — Ambulatory Visit (INDEPENDENT_AMBULATORY_CARE_PROVIDER_SITE_OTHER): Payer: BC Managed Care – PPO | Admitting: Internal Medicine

## 2013-04-26 VITALS — BP 130/70 | HR 84 | Temp 98.3°F | Wt 168.0 lb

## 2013-04-26 VITALS — BP 121/76 | HR 73 | Ht 59.0 in | Wt 170.0 lb

## 2013-04-26 DIAGNOSIS — F411 Generalized anxiety disorder: Secondary | ICD-10-CM

## 2013-04-26 DIAGNOSIS — Z1151 Encounter for screening for human papillomavirus (HPV): Secondary | ICD-10-CM

## 2013-04-26 DIAGNOSIS — Z124 Encounter for screening for malignant neoplasm of cervix: Secondary | ICD-10-CM

## 2013-04-26 DIAGNOSIS — K219 Gastro-esophageal reflux disease without esophagitis: Secondary | ICD-10-CM

## 2013-04-26 DIAGNOSIS — E785 Hyperlipidemia, unspecified: Secondary | ICD-10-CM

## 2013-04-26 DIAGNOSIS — Z01419 Encounter for gynecological examination (general) (routine) without abnormal findings: Secondary | ICD-10-CM

## 2013-04-26 DIAGNOSIS — Z0000 Encounter for general adult medical examination without abnormal findings: Secondary | ICD-10-CM

## 2013-04-26 LAB — CBC WITH DIFFERENTIAL/PLATELET
BASOS ABS: 0 10*3/uL (ref 0.0–0.1)
Basophils Relative: 0.4 % (ref 0.0–3.0)
EOS PCT: 1.5 % (ref 0.0–5.0)
Eosinophils Absolute: 0.1 10*3/uL (ref 0.0–0.7)
HCT: 40.7 % (ref 36.0–46.0)
HEMOGLOBIN: 13.6 g/dL (ref 12.0–15.0)
Lymphocytes Relative: 34.3 % (ref 12.0–46.0)
Lymphs Abs: 2.6 10*3/uL (ref 0.7–4.0)
MCHC: 33.4 g/dL (ref 30.0–36.0)
MCV: 89.2 fl (ref 78.0–100.0)
MONOS PCT: 7.7 % (ref 3.0–12.0)
Monocytes Absolute: 0.6 10*3/uL (ref 0.1–1.0)
NEUTROS ABS: 4.2 10*3/uL (ref 1.4–7.7)
Neutrophils Relative %: 56.1 % (ref 43.0–77.0)
PLATELETS: 269 10*3/uL (ref 150.0–400.0)
RBC: 4.56 Mil/uL (ref 3.87–5.11)
RDW: 13.3 % (ref 11.5–14.6)
WBC: 7.6 10*3/uL (ref 4.5–10.5)

## 2013-04-26 LAB — LIPID PANEL
Cholesterol: 203 mg/dL — ABNORMAL HIGH (ref 0–200)
HDL: 31.4 mg/dL — AB (ref >39.00)
Total CHOL/HDL Ratio: 6
Triglycerides: 262 mg/dL — ABNORMAL HIGH (ref 0.0–149.0)
VLDL: 52.4 mg/dL — ABNORMAL HIGH (ref 0.0–40.0)

## 2013-04-26 LAB — COMPREHENSIVE METABOLIC PANEL
ALT: 23 U/L (ref 0–35)
AST: 22 U/L (ref 0–37)
Albumin: 3.9 g/dL (ref 3.5–5.2)
Alkaline Phosphatase: 85 U/L (ref 39–117)
BILIRUBIN TOTAL: 0.4 mg/dL (ref 0.3–1.2)
BUN: 11 mg/dL (ref 6–23)
CALCIUM: 9.1 mg/dL (ref 8.4–10.5)
CHLORIDE: 106 meq/L (ref 96–112)
CO2: 25 meq/L (ref 19–32)
CREATININE: 0.9 mg/dL (ref 0.4–1.2)
GFR: 65.65 mL/min (ref >60.00)
GLUCOSE: 87 mg/dL (ref 70–99)
Potassium: 4.5 mEq/L (ref 3.5–5.1)
Sodium: 139 mEq/L (ref 135–145)
Total Protein: 7.1 g/dL (ref 6.0–8.3)

## 2013-04-26 LAB — T4, FREE: Free T4: 0.74 ng/dL (ref 0.60–1.60)

## 2013-04-26 LAB — TSH: TSH: 1.14 u[IU]/mL (ref 0.35–5.50)

## 2013-04-26 LAB — LDL CHOLESTEROL, DIRECT: Direct LDL: 133.3 mg/dL

## 2013-04-26 NOTE — Patient Instructions (Signed)

## 2013-04-26 NOTE — Progress Notes (Signed)
Subjective:    Patient ID: Shelly Hood, female    DOB: 04-08-1955, 58 y.o.   MRN: [id number]  HPI Here for physical Doing fine Still smokes a few cigarettes a day Not exercising still Weight is stable Fairly careful with eating--but will snack on Cheez-its  Same job Seeing Dr Hulan Fray this afternoon Dental visit recently  Anxiety persists Does okay with the daily AM alprazolam May be a big reason why she hasn't stopped smoking  New problem with right shoulder No known injury Moves okay  Current Outpatient Prescriptions on File Prior to Visit  Medication Sig Dispense Refill  . ALPRAZolam (XANAX) 0.25 MG tablet Take 1 tablet (0.25 mg total) by mouth 2 (two) times daily as needed for sleep or anxiety.  180 tablet  0  . atorvastatin (LIPITOR) 20 MG tablet TAKE 1 TABLET DAILY  90 tablet  0  . calcium-vitamin D (OSCAL WITH D) 500-200 MG-UNIT per tablet Take 1 tablet by mouth daily.      Marland Kitchen FLUoxetine (PROZAC) 40 MG capsule TAKE 1 CAPSULE DAILY  90 capsule  0  . omeprazole (PRILOSEC) 20 MG capsule Take 1 capsule (20 mg total) by mouth 2 (two) times daily.  180 capsule  3   No current facility-administered medications on file prior to visit.    Allergies  Allergen Reactions  . Codeine Sulfate     REACTION: nausea only tolerates hydrocodone    Past Medical History  Diagnosis Date  . Panic attacks   . High cholesterol     Past Surgical History  Procedure Laterality Date  . Rhinoplasty      X3    Family History  Problem Relation Age of Onset  . Diabetes Mother   . Diabetes Maternal Grandmother   . Heart disease Father     History   Social History  . Marital Status: Married    Spouse Name: N/A    Number of Children: 2  . Years of Education: N/A   Occupational History  . 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 Smoking status: Current Every Day Smoker -- 1.00 packs/day    Types: Cigarettes  . Smokeless tobacco:  Never Used     Comment: 2 packs per week  . Alcohol Use: No  . Drug Use: No  . Sexual Activity: Not Currently    Partners: Male   Other Topics Concern  . Not on file   Social History Narrative  . No narrative on file   Review of Systems  Constitutional: Negative for fatigue and unexpected weight change.       Wears seat belt  HENT: Negative for dental problem, hearing loss and tinnitus.        Regular with dentist  Eyes: Negative for visual disturbance.       No diplopia or unilateral vision loss  Respiratory: Negative for cough, chest tightness and shortness of breath.   Cardiovascular: Negative for chest pain, palpitations and leg swelling.  Gastrointestinal: Negative for nausea, vomiting, abdominal pain, constipation and blood in stool.       Heartburn controlled with med  Endocrine: Negative for cold intolerance and heat intolerance.  Genitourinary: Negative for dysuria, hematuria, difficulty urinating and dyspareunia.       Borderline stress incontinence  Musculoskeletal: Positive for arthralgias. Negative for back pain and joint swelling.       Aleve helps the shoulder  Skin: Negative for rash.  Very dry skin Red spot on right elbow--no change  Allergic/Immunologic: Negative for environmental allergies and immunocompromised state.  Neurological: Negative for dizziness, syncope, weakness, light-headedness, numbness and headaches.  Hematological: Negative for adenopathy. Does not bruise/bleed easily.  Psychiatric/Behavioral: Negative for sleep disturbance and dysphoric mood. The patient is not nervous/anxious.        Objective:   Physical Exam  Constitutional: She is oriented to person, place, and time. She appears well-developed and well-nourished. No distress.  HENT:  Head: Normocephalic and atraumatic.  Right Ear: External ear normal.  Left Ear: External ear normal.  Mouth/Throat: Oropharynx is clear and moist. No oropharyngeal exudate.  Eyes: Conjunctivae  and EOM are normal. Pupils are equal, round, and reactive to light.  Neck: Normal range of motion. Neck supple. No thyromegaly present.  Cardiovascular: Normal rate, regular rhythm, normal heart sounds and intact distal pulses.  Exam reveals no gallop.   No murmur heard. Pulmonary/Chest: Effort normal and breath sounds normal. No respiratory distress. She has no wheezes. She has no rales.  Abdominal: Soft. There is no tenderness.  Musculoskeletal: She exhibits no edema and no tenderness.  Lymphadenopathy:    She has no cervical adenopathy.    She has no axillary adenopathy.  Neurological: She is alert and oriented to person, place, and time.  Skin: No rash noted. No erythema.  Psychiatric: She has a normal mood and affect. Her behavior is normal.          Assessment & Plan:

## 2013-04-26 NOTE — Assessment & Plan Note (Signed)
Does okay with current regimen

## 2013-04-26 NOTE — Patient Instructions (Signed)
Please call 1-800 QUIT NOW for advice about giving up the last of the cigarettes.  Exercise to Lose Weight Exercise and a healthy diet may help you lose weight. Your doctor may suggest specific exercises. EXERCISE IDEAS AND TIPS  Choose low-cost things you enjoy doing, such as walking, bicycling, or exercising to workout videos.  Take stairs instead of the elevator.  Walk during your lunch break.  Park your car further away from work or school.  Go to a gym or an exercise class.  Start with 5 to 10 minutes of exercise each day. Build up to 30 minutes of exercise 4 to 6 days a week.  Wear shoes with good support and comfortable clothes.  Stretch before and after working out.  Work out until you breathe harder and your heart beats faster.  Drink extra water when you exercise.  Do not do so much that you hurt yourself, feel dizzy, or get very short of breath. Exercises that burn about 150 calories:  Running 1  miles in 15 minutes.  Playing volleyball for 45 to 60 minutes.  Washing and waxing a car for 45 to 60 minutes.  Playing touch football for 45 minutes.  Walking 1  miles in 35 minutes.  Pushing a stroller 1  miles in 30 minutes.  Playing basketball for 30 minutes.  Raking leaves for 30 minutes.  Bicycling 5 miles in 30 minutes.  Walking 2 miles in 30 minutes.  Dancing for 30 minutes.  Shoveling snow for 15 minutes.  Swimming laps for 20 minutes.  Walking up stairs for 15 minutes.  Bicycling 4 miles in 15 minutes.  Gardening for 30 to 45 minutes.  Jumping rope for 15 minutes.  Washing windows or floors for 45 to 60 minutes. Document Released: 03/30/2010 Document Revised: 05/20/2011 Document Reviewed: 03/30/2010 [facility] Patient Information 2014 [place], [place]. DASH Diet The DASH diet stands for "Dietary Approaches to Stop Hypertension." It is a healthy eating plan that has been shown to reduce high blood pressure (hypertension) in as little as  14 days, while also possibly providing other significant health benefits. These other health benefits include reducing the risk of breast cancer after menopause and reducing the risk of type 2 diabetes, heart disease, colon cancer, and stroke. Health benefits also include weight loss and slowing kidney failure in patients with chronic kidney disease.  DIET GUIDELINES  Limit salt (sodium). Your diet should contain less than 1500 mg of sodium daily.  Limit refined or processed carbohydrates. Your diet should include mostly whole grains. Desserts and added sugars should be used sparingly.  Include small amounts of heart-healthy fats. These types of fats include nuts, oils, and tub margarine. Limit saturated and trans fats. These fats have been shown to be harmful in the body. CHOOSING FOODS  The following food groups are based on a 2000 calorie diet. See your Registered Dietitian for individual calorie needs. Grains and Grain Products (6 to 8 servings daily)  Eat More Often: Whole-wheat bread, brown rice, whole-grain or wheat pasta, quinoa, popcorn without added fat or salt (air popped).  Eat Less Often: White bread, white pasta, white rice, cornbread. Vegetables (4 to 5 servings daily)  Eat More Often: Fresh, frozen, and canned vegetables. Vegetables may be raw, steamed, roasted, or grilled with a minimal amount of fat.  Eat Less Often/Avoid: Creamed or fried vegetables. Vegetables in a cheese sauce. Fruit (4 to 5 servings daily)  Eat More Often: All fresh, canned (in natural juice), or frozen fruits.  Dried fruits without added sugar. One hundred percent fruit juice ( cup [237 mL] daily).  Eat Less Often: Dried fruits with added sugar. Canned fruit in light or heavy syrup. [employer], Fish, and Poultry (2 servings or less daily. One serving is 3 to 4 oz [85-114 g]).  Eat More Often: Ninety percent or leaner ground beef, tenderloin, sirloin. Round cuts of beef, chicken breast, [place] breast.  All fish. Grill, bake, or broil your meat. Nothing should be fried.  Eat Less Often/Avoid: Fatty cuts of meat, [place], or chicken leg, thigh, or wing. Fried cuts of meat or fish. Dairy (2 to 3 servings)  Eat More Often: Low-fat or fat-free milk, low-fat plain or light yogurt, reduced-fat or part-skim cheese.  Eat Less Often/Avoid: Milk (whole, 2%).Whole milk yogurt. Full-fat cheeses. Nuts, Seeds, and Legumes (4 to 5 servings per week)  Eat More Often: All without added salt.  Eat Less Often/Avoid: Salted nuts and seeds, canned beans with added salt. Fats and Sweets (limited)  Eat More Often: Vegetable oils, tub margarines without trans fats, sugar-free gelatin. Mayonnaise and salad dressings.  Eat Less Often/Avoid: Coconut oils, palm oils, butter, stick margarine, cream, half and half, cookies, candy, pie. FOR MORE INFORMATION The Dash Diet Eating Plan: www.dashdiet.org Document Released: 02/14/2011 Document Revised: 05/20/2011 Document Reviewed: 02/14/2011 [facility] Patient Information 2014 [place], [place].

## 2013-04-26 NOTE — Assessment & Plan Note (Signed)
Will continue primary prevention

## 2013-04-26 NOTE — Progress Notes (Signed)
Subjective:    Shelly Hood is a 58 y.o. female who presents for an annual exam. The patient has no complaints today. Needs mammogram.The patient rarely (about once a year) sexually active. GYN screening history: last pap: was normal. The patient wears seatbelts: yes. The patient participates in regular exercise: no. Has the patient ever been transfused or tattooed?: no. The patient reports that there is not domestic violence in her life.   Menstrual History: OB History   Grav Para Term Preterm Abortions TAB SAB Ect 40 Living                  Menarche age: 89 Coitarche: 61   No LMP recorded. Patient is postmenopausal.    The following portions of the patient's history were reviewed and updated as appropriate: allergies, current medications, past family history, past medical history, past social history, past surgical history and problem list.  Review of Systems A comprehensive review of systems was negative. 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 Needs mammo, had blood work and flu vaccine.   Objective:    BP 121/76  Pulse 73  Ht 4\' 11"  (1.499 m)  Wt 170 lb (77.111 kg)  BMI 34.32 kg/m2  General Appearance:    Alert, cooperative, no distress, appears stated age  Head:    Normocephalic, without obvious abnormality, atraumatic  Eyes:    PERRL, conjunctiva/corneas clear, EOM's intact, fundi    benign, both eyes  Ears:    Normal TM's and external ear canals, both ears  Nose:   Nares normal, septum midline, mucosa normal, no drainage    or sinus tenderness  Throat:   Lips, mucosa, and tongue normal; teeth and gums normal  Neck:   Supple, symmetrical, trachea midline, no adenopathy;    thyroid:  no enlargement/tenderness/nodules; no carotid   bruit or JVD  Back:     Symmetric, no curvature, ROM normal, no CVA tenderness  Lungs:     Clear to auscultation bilaterally, respirations unlabored  Chest Wall:    No tenderness or deformity   Heart:    Regular rate and rhythm, S1  and S2 normal, no murmur, rub   or gallop  Breast Exam:    No tenderness, masses, or nipple abnormality  Abdomen:     Soft, non-tender, bowel sounds active all four quadrants,    no masses, no organomegaly  Genitalia:    Normal female without lesion, discharge or tenderness, moderate v v a, NSSA, NT, mobile, normal adnexal exam     Extremities:   Extremities normal, atraumatic, no cyanosis or edema  Pulses:   2+ and symmetric all extremities  Skin:   Skin color, texture, turgor normal, no rashes or lesions  Lymph nodes:   Cervical, supraclavicular, and axillary nodes normal  Neurologic:   CNII-XII intact, normal strength, sensation and reflexes    throughout  .    Assessment:    Healthy female exam.    Plan:     Breast self exam technique reviewed and patient encouraged to perform self-exam monthly. Thin prep Pap smear. with cotesting Mammogram in [place]

## 2013-04-26 NOTE — Assessment & Plan Note (Signed)
Healthy but needs to work on fitness Discussed cigarette cessation -info given GYN care by Dr Hulan Fray

## 2013-04-26 NOTE — Assessment & Plan Note (Signed)
Quiet on daily PPI

## 2013-05-18 ENCOUNTER — Ambulatory Visit: Payer: Self-pay | Admitting: Obstetrics & Gynecology

## 2013-05-28 ENCOUNTER — Encounter: Payer: Self-pay | Admitting: Internal Medicine

## 2013-05-28 ENCOUNTER — Ambulatory Visit (INDEPENDENT_AMBULATORY_CARE_PROVIDER_SITE_OTHER): Payer: BC Managed Care – PPO | Admitting: Internal Medicine

## 2013-05-28 VITALS — BP 100/60 | HR 83 | Temp 98.5°F | Wt 171.0 lb

## 2013-05-28 DIAGNOSIS — J069 Acute upper respiratory infection, unspecified: Secondary | ICD-10-CM | POA: Insufficient documentation

## 2013-05-28 MED ORDER — FLUOXETINE HCL 40 MG PO CAPS: 40.0000 mg | ORAL_CAPSULE | Freq: Every day | ORAL | Status: DC

## 2013-05-28 MED ORDER — ATORVASTATIN CALCIUM 20 MG PO TABS: 20.0000 mg | ORAL_TABLET | Freq: Every day | ORAL | Status: DC

## 2013-05-28 MED ORDER — OMEPRAZOLE 20 MG PO CPDR: 20.0000 mg | DELAYED_RELEASE_CAPSULE | Freq: Two times a day (BID) | ORAL | Status: DC

## 2013-05-28 NOTE — Assessment & Plan Note (Signed)
Really seems to have a viral infection Discussed supportive care She can call if symptoms of secondary infection next week---would then try empiric amoxil

## 2013-05-28 NOTE — Progress Notes (Signed)
   Subjective:    Patient ID: Shelly Hood, female    DOB: 11/26/1955, 58 y.o.   MRN: [id number]  HPI Started with illness 2 days ago Very achy and had fever The bottom of her feet even hurt Tylenol gives some relief Has frontal head pain---some better today Has just rested--felt better in afternoon---then fever recurred last night (100.8) Some headache  Some cough-- dry No nasal drainage or PND No ear pain No SOB  No other Rx  Current Outpatient Prescriptions on File Prior to Visit  Medication Sig Dispense Refill  . ALPRAZolam (XANAX) 0.25 MG tablet Take 1 tablet (0.25 mg total) by mouth 2 (two) times daily as needed for sleep or anxiety.  180 tablet  0  . calcium-vitamin D (OSCAL WITH D) 500-200 MG-UNIT per tablet Take 1 tablet by mouth daily.       No current facility-administered medications on file prior to visit.    Allergies  Allergen Reactions  . Codeine Sulfate     REACTION: nausea only tolerates hydrocodone    Past Medical History  Diagnosis Date  . Panic attacks   . High cholesterol   . Obesity     Past Surgical History  Procedure Laterality Date  . Rhinoplasty      X3    Family History  Problem Relation Age of Onset  . Diabetes Mother   . Diabetes Maternal Grandmother   . Heart disease Father     History   Social History  . Marital Status: Married    Spouse Name: N/A    Number of Children: 2  . Years of Education: N/A   Occupational History  . 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 Smoking status: Current Every Day Smoker -- 0.25 packs/day    Types: Cigarettes  . Smokeless tobacco: Never Used     Comment: 2 packs per week  . Alcohol Use: No  . Drug Use: No  . Sexual Activity: Yes    Partners: Male    Birth Control/ Protection: None   Other Topics Concern  . Not on file   Social History Narrative  . No narrative on file   Review of Systems No vomiting or diarrhea Appetite is  off No rash     Objective:   Physical Exam  Constitutional: She appears well-developed. No distress.  HENT:  Mouth/Throat: Oropharynx is clear and moist. No oropharyngeal exudate.  No sinus tenderness TMs normal Moderate nasal inflammation  Neck: Normal range of motion. Neck supple. No thyromegaly present.  Pulmonary/Chest: Effort normal and breath sounds normal. No respiratory distress. She has no wheezes. She has no rales.  Lymphadenopathy:    She has no cervical adenopathy.  Skin: No rash noted.          Assessment & Plan:

## 2013-05-28 NOTE — Progress Notes (Signed)
Pre visit review using our clinic review tool, if applicable. No additional management support is needed unless otherwise documented below in the visit note. 

## 2013-05-31 ENCOUNTER — Telehealth: Payer: Self-pay | Admitting: Internal Medicine

## 2013-05-31 ENCOUNTER — Telehealth: Payer: Self-pay

## 2013-05-31 MED ORDER — AMOXICILLIN 500 MG PO TABS
1000.0000 mg | ORAL_TABLET | Freq: Two times a day (BID) | ORAL | Status: DC
Start: 2013-05-31 — End: 2013-11-23

## 2013-05-31 NOTE — Telephone Encounter (Signed)
Spoke with patient and advised results   

## 2013-05-31 NOTE — Telephone Encounter (Signed)
Let her know the antibiotic was sent

## 2013-05-31 NOTE — Telephone Encounter (Signed)
Relevant patient education assigned to patient using Emmi. ° °

## 2013-05-31 NOTE — Telephone Encounter (Signed)
Pt left v/m; pt was seen 05/28/13 and pt request rx for antibiotic discussed at 05/28/13 appt sent to [facility] [address].Please advise.

## 2013-06-03 ENCOUNTER — Telehealth: Payer: Self-pay

## 2013-06-03 MED ORDER — FLUCONAZOLE 100 MG PO TABS: 100.0000 mg | ORAL_TABLET | Freq: Every day | ORAL | Status: DC

## 2013-06-03 NOTE — Telephone Encounter (Signed)
Spoke with patient and advised results   

## 2013-06-03 NOTE — Telephone Encounter (Signed)
Pt left v/m that recent antibiotics has caused thrush in her mouth and yeast infection;, vaginal irritation and vaginal itching, no discharge ;pt request med sent to [place]. Pt request cb.

## 2013-06-03 NOTE — Telephone Encounter (Signed)
Please let her know I sent a medication for this. 1 daily for a week

## 2013-08-30 ENCOUNTER — Other Ambulatory Visit: Payer: Self-pay | Admitting: *Deleted

## 2013-08-30 MED ORDER — ALPRAZOLAM 0.25 MG PO TABS: 0.2500 mg | ORAL_TABLET | Freq: Two times a day (BID) | ORAL | Status: DC | PRN

## 2013-08-30 NOTE — Telephone Encounter (Signed)
Form on your desk  

## 2013-08-30 NOTE — Telephone Encounter (Signed)
Okay #[phone] x 0 Form signed

## 2013-08-30 NOTE — Telephone Encounter (Signed)
rx faxed to pharmacy manually  

## 2013-10-28 ENCOUNTER — Ambulatory Visit: Payer: BC Managed Care – PPO | Admitting: Internal Medicine

## 2013-11-23 ENCOUNTER — Encounter: Payer: Self-pay | Admitting: Internal Medicine

## 2013-11-23 ENCOUNTER — Telehealth: Payer: Self-pay | Admitting: Gastroenterology

## 2013-11-23 ENCOUNTER — Telehealth: Payer: Self-pay

## 2013-11-23 ENCOUNTER — Ambulatory Visit (INDEPENDENT_AMBULATORY_CARE_PROVIDER_SITE_OTHER): Payer: BC Managed Care – PPO | Admitting: Internal Medicine

## 2013-11-23 VITALS — BP 120/80 | HR 86 | Temp 97.5°F | Wt 165.0 lb

## 2013-11-23 DIAGNOSIS — D235 Other benign neoplasm of skin of trunk: Secondary | ICD-10-CM | POA: Insufficient documentation

## 2013-11-23 DIAGNOSIS — R1013 Epigastric pain: Secondary | ICD-10-CM

## 2013-11-23 MED ORDER — ESOMEPRAZOLE MAGNESIUM 40 MG PO CPDR: 40.0000 mg | DELAYED_RELEASE_CAPSULE | Freq: Two times a day (BID) | ORAL | Status: DC

## 2013-11-23 MED ORDER — ESOMEPRAZOLE MAGNESIUM 40 MG PO PACK: 40.0000 mg | PACK | Freq: Two times a day (BID) | ORAL | Status: DC

## 2013-11-23 NOTE — Telephone Encounter (Signed)
[facility] [address] left v/m wanting to verify Nexium that was sent earlier today was packets; is pt supposed to have packets or capsules. [facility] request cb.

## 2013-11-23 NOTE — Assessment & Plan Note (Addendum)
Has chronic reflux but now much worse in past 2 months No dysphagia but food causes nausea  Prominent night symptoms and in throat do suggest acid reflux related disease---but severity indicate need for GI eval and possible EGD Doesn't eat after dinner Could be gallbladder---but would be very atypical for this. Will hold off on imaging Will change PPI to see if that helps Discussed stopping smoking

## 2013-11-23 NOTE — Telephone Encounter (Signed)
Medication corrected and resent. 

## 2013-11-23 NOTE — Assessment & Plan Note (Signed)
Liquid nitrogen 30 seconds x 2 Discussed home care  Other 2 lesions also treated

## 2013-11-23 NOTE — Progress Notes (Signed)
Pre visit review using our clinic review tool, if applicable. No additional management support is needed unless otherwise documented below in the visit note. 

## 2013-11-23 NOTE — Progress Notes (Signed)
Subjective:    Patient ID: Shelly Hood, female    DOB: [birth year], 58 y.o.   MRN: [id number]  HPI Having trouble with stomach for 2 months Getting worse and worse Gets sick after eating Abdomen getting hard and distended at night--has to get up and has massive belching Feels carbonated Goes on all night--then wakes up with sensation in throat. Will vomit in AM at times  "feel terrible"  Still taking omeprazole 40mg  bid Using rolaids also No dysphagia  Still smokes-- discussed stopping 1 cup coffee daily, 1 unsweet tea in afternoon No other caffeine No NSAIDs--occasional tylenol High stress job but really no change Appetite is okay-but afraid to eat  Has nodule on chest that bothers her  Current Outpatient Prescriptions on File Prior to Visit  Medication Sig Dispense Refill  . ALPRAZolam (XANAX) 0.25 MG tablet Take 1 tablet (0.25 mg total) by mouth 2 (two) times daily as needed for sleep or anxiety.  180 tablet  0  . atorvastatin (LIPITOR) 20 MG tablet Take 1 tablet (20 mg total) by mouth daily at 6 PM.  90 tablet  3  . calcium-vitamin D (OSCAL WITH D) 500-200 MG-UNIT per tablet Take 1 tablet by mouth daily.      Marland Kitchen FLUoxetine (PROZAC) 40 MG capsule Take 1 capsule (40 mg total) by mouth daily.  90 capsule  3  . omeprazole (PRILOSEC) 20 MG capsule Take 1 capsule (20 mg total) by mouth 2 (two) times daily.  180 capsule  3   No current facility-administered medications on file prior to visit.    Allergies  Allergen Reactions  . Codeine Sulfate     REACTION: nausea only tolerates hydrocodone    Past Medical History  Diagnosis Date  . Panic attacks   . High cholesterol   . Obesity     Past Surgical History  Procedure Laterality Date  . Rhinoplasty      X3    Family History  Problem Relation Age of Onset  . Diabetes Mother   . Diabetes Maternal Grandmother   . Heart disease Father     History   Social History  . Marital Status: Married    Spouse  Name: N/A    Number of Children: 2  . Years of Education: N/A   Occupational History  . 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 Smoking status: Current Every Day Smoker -- 0.25 packs/day    Types: Cigarettes  . Smokeless tobacco: Never Used     Comment: 2 packs per week  . Alcohol Use: No  . Drug Use: No  . Sexual Activity: Yes    Partners: Male    Birth Control/ Protection: None   Other Topics Concern  . Not on file   Social History Narrative  . No narrative on file   Review of Systems Bowels are fine Weight may be down slightly No choking  No cough or SOB    Objective:   Physical Exam  Constitutional: She appears well-developed and well-nourished. No distress.  Neck: Normal range of motion. Neck supple. No thyromegaly present.  Pulmonary/Chest: Effort normal and breath sounds normal. No respiratory distress. She has no wheezes. She has no rales.  Abdominal: Soft. Bowel sounds are normal. She exhibits no distension. There is no rebound and no guarding.  Mild epigastric tenderness Murphy's sign absent  Lymphadenopathy:    She has no cervical adenopathy.  Skin:  Small dermatofibroma in mid chest 2 smaller fleshy moles to left side          Assessment & Plan:

## 2013-11-23 NOTE — Telephone Encounter (Signed)
Patient is scheduled for Alonza Bogus, PA on 11/26/13 2:30.  I have left a message for Vaughan Basta with the appt date and time.

## 2013-11-26 ENCOUNTER — Ambulatory Visit (INDEPENDENT_AMBULATORY_CARE_PROVIDER_SITE_OTHER): Payer: BC Managed Care – PPO | Admitting: Gastroenterology

## 2013-11-26 ENCOUNTER — Encounter: Payer: Self-pay | Admitting: Gastroenterology

## 2013-11-26 VITALS — BP 120/76 | HR 63 | Ht 59.0 in | Wt 164.0 lb

## 2013-11-26 DIAGNOSIS — R1013 Epigastric pain: Secondary | ICD-10-CM

## 2013-11-26 DIAGNOSIS — K219 Gastro-esophageal reflux disease without esophagitis: Secondary | ICD-10-CM

## 2013-11-26 NOTE — Patient Instructions (Signed)

## 2013-11-26 NOTE — Progress Notes (Signed)
11/26/2013 Shelly Hood [id number] 10/09/1955   HISTORY OF PRESENT ILLNESS:  This is a pleasant 58 year old female how is previously known to Dr. Fuller Plan for colonoscopy in April 2011. At that time the study showed mild diverticulosis in the sigmoid colon; repeat colonoscopy is recommended for 10 years from that time.  She presents to our office today with recent issues of significant heartburn, reflux, belching. She was referred here by her PCP, Dr. Silvio Pate, to discuss these issues.  She says that she had been on Prilosec for a couple of years for heartburn and reflux. About 2 months ago, however, at nighttime she started having severe reflux with epigastric pressure and very deep belching. When she would belch it would relieve the discomfort in her epigastrium.  In the morning she would wake up with acid coming into her throat, which caused her to gag. She saw her PCP earlier this week and was placed on Nexium 40 mg twice daily. She says that since starting that medication she has not experienced any symptoms at all.   Past Medical History  Diagnosis Date  . Panic attacks   . High cholesterol   . Obesity    Past Surgical History  Procedure Laterality Date  . Rhinoplasty      X3    reports that she has been smoking Cigarettes.  She has been smoking about 0.25 packs per day. She has never used smokeless tobacco. She reports that she does not drink alcohol or use illicit drugs. family history includes Diabetes in her maternal grandmother and mother; Heart disease in her father. Allergies  Allergen Reactions  . Codeine Sulfate     REACTION: nausea only tolerates hydrocodone      Outpatient Encounter Prescriptions as of 11/26/2013  Medication Sig  . ALPRAZolam (XANAX) 0.25 MG tablet Take 1 tablet (0.25 mg total) by mouth 2 (two) times daily as needed for sleep or anxiety.  Marland Kitchen atorvastatin (LIPITOR) 20 MG tablet Take 1 tablet (20 mg total) by mouth daily at 6 PM.  . calcium-vitamin  D (OSCAL WITH D) 500-200 MG-UNIT per tablet Take 1 tablet by mouth daily.  Marland Kitchen esomeprazole (NEXIUM) 40 MG capsule Take 1 capsule (40 mg total) by mouth 2 (two) times daily before a meal.  . FLUoxetine (PROZAC) 40 MG capsule Take 1 capsule (40 mg total) by mouth daily.  . [DISCONTINUED] omeprazole (PRILOSEC) 20 MG capsule Take 20 mg by mouth daily.     REVIEW OF SYSTEMS  : All other systems reviewed and negative except where noted in the History of Present Illness.   PHYSICAL EXAM: BP 120/76  Pulse 63  Ht 4\' 11"  (1.499 m)  Wt 164 lb (74.39 kg)  BMI 33.11 kg/m2 General: Well developed white female in no acute distress Head: Normocephalic and atraumatic Eyes:  Sclerae anicteric, conjunctiva pink. Ears: Normal auditory acuity Lungs: Clear throughout to auscultation Heart: Regular rate and rhythm Abdomen: Soft, non-distended.  Normal bowel sounds.  Non-tender. Musculoskeletal: Symmetrical with no gross deformities  Skin: No lesions on visible extremities Extremities: No edema  Neurological: Alert oriented x 4, grossly non-focal Psychological:  Alert and cooperative. Normal mood and affect  ASSESSMENT AND PLAN: -GERD and epigastric abdominal pain:  Much improved for the past couple of days on Nexium 40 mg BID.  She will continue this but we will also schedule EGD due to the recent drastic change that she had in symptoms even while she was on PPI therapy.

## 2013-11-26 NOTE — Addendum Note (Signed)
Addended by: Hope Pigeon A on: 11/26/2013 04:58 PM   Modules accepted: Orders

## 2013-11-28 NOTE — Progress Notes (Signed)
Reviewed and agree with management plan.  Jackqueline Aquilar T. Andras Grunewald, MD FACG 

## 2013-12-01 ENCOUNTER — Other Ambulatory Visit: Payer: Self-pay | Admitting: *Deleted

## 2013-12-01 MED ORDER — ALPRAZOLAM 0.25 MG PO TABS: 0.2500 mg | ORAL_TABLET | Freq: Two times a day (BID) | ORAL | Status: DC | PRN

## 2013-12-01 NOTE — Telephone Encounter (Signed)
08/30/13 Form on your desk to be faxed to [facility]

## 2013-12-01 NOTE — Telephone Encounter (Signed)
rx faxed to pharmacy manually  

## 2013-12-01 NOTE — Telephone Encounter (Signed)
Rx #180 x 0 signed

## 2013-12-09 ENCOUNTER — Ambulatory Visit (AMBULATORY_SURGERY_CENTER): Payer: BC Managed Care – PPO | Admitting: Gastroenterology

## 2013-12-09 ENCOUNTER — Encounter: Payer: BC Managed Care – PPO | Admitting: Gastroenterology

## 2013-12-09 ENCOUNTER — Encounter: Payer: Self-pay | Admitting: Gastroenterology

## 2013-12-09 VITALS — BP 131/78 | HR 76 | Temp 97.9°F | Resp 16 | Ht 59.0 in | Wt 164.0 lb

## 2013-12-09 DIAGNOSIS — K219 Gastro-esophageal reflux disease without esophagitis: Secondary | ICD-10-CM

## 2013-12-09 DIAGNOSIS — R1013 Epigastric pain: Secondary | ICD-10-CM

## 2013-12-09 DIAGNOSIS — K259 Gastric ulcer, unspecified as acute or chronic, without hemorrhage or perforation: Secondary | ICD-10-CM

## 2013-12-09 MED ORDER — SODIUM CHLORIDE 0.9 % IV SOLN: 500.0000 mL | INTRAVENOUS | Status: DC

## 2013-12-09 NOTE — Progress Notes (Addendum)
Pt had constant, harsh cough upon arrival to the RR.  Sats 84-86% on room air.  O2 placed at 3 liters and sats at 97-99%.  She is placed on her back with her head up.  While in the RR, she still has cough, but less often.  At discharge, she is on 98% and not coughing.  While in the RR, she denies SOB, throat pain or difficulty swallowing.  She does c/o headache and Dr. Fuller Plan aware.  He told her to take Tylenol when she gets home.    I carefully outlined pt's diet until 12-11-13 and copies of clear liquid and full liquid diets given.  Understanding voiced by both pt and husband.

## 2013-12-09 NOTE — Progress Notes (Signed)
Pt had a good amount of fluid in oropharynx as evident when egd scope coming out.  pataient was suctioned thru scope by Dr Fuller Plan and by myself with yaunker.  Pt head down in t burg  Pt coughing and sats in high 80s n PACU..Dr Fuller Plan aware.Marland KitchenPACU RN encouraged to keep O2 on and head down and encourage coughing

## 2013-12-09 NOTE — Op Note (Signed)
[place]  [address]. [place], [id number]   ENDOSCOPY PROCEDURE REPORT PATIENT: Shelly Hood, Shelly Hood  MR#: [id number] BIRTHDATE: [birth year] , 23  yrs. old GENDER: female ENDOSCOPIST: Ladene Artist, MD, [facility] PROCEDURE DATE:  12/09/2013 PROCEDURE:  EGD w/ balloon dilation ASA CLASS:     Class II INDICATIONS:  epigastric abdominal pain and history of esophageal reflux. MEDICATIONS: Monitored anesthesia care and Propofol 250 mg IV TOPICAL ANESTHETIC: none DESCRIPTION OF PROCEDURE: After the risks benefits and alternatives of the procedure were thoroughly explained, informed consent was obtained.  The LB [id number] endoscope was introduced through the mouth and advanced to the second portion of the duodenum , Limited by Retained gastric solids in the fundus.  The instrument was slowly withdrawn as the mucosa was fully examined.  STOMACH: A single non-bleeding, clean-based, round  ulcer 5 mm in size was found at the pylorus.  Moderate stenosis measuring 8 mm in diameter was traversable after dilation at the pylorus.  Using a TTS-balloon the stricture was dilated up to 10 mm, 79mm and then 75mm.  Following this dilation, there was a medium sized mucosal rent and the endoscope was able to traverse the pylorus.  The stomach otherwise appeared normal except for some retained gastric fundus solids c/w partial gastric outlet obstruction. ESOPHAGUS: The mucosa of the esophagus appeared normal. DUODENUM: The duodenal mucosa showed no abnormalities in the bulb and 2nd part of the duodenum.  Retroflexed views revealed no abnormalities.   The scope was then withdrawn from the patient and the procedure completed.  COMPLICATIONS: There were no immediate complications.  ENDOSCOPIC IMPRESSION: 1.   Single ulcer 5 mm  at the pylorus 2.   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   Anti-reflux regimen 2.  Continue PPI BID, avoid all NSAIDs, clear liquid diet for 1 day, then advance to full liquids for 1 days, then solid diet 3.  OP follow-up in 4 weeks.  eSigned:  Ladene Artist, MD, [facility] 12/09/2013 12:23 PM   R1  The ICD and CPT codes recommended by this software are interpretations from the data that the clinical staff has captured with the software.  The verification of the translation of this report to the ICD and CPT codes and modifiers is the sole responsibility of the health care institution and practicing physician where this report was generated.  [employer] [place]. will not be held responsible for the validity of the ICD and CPT codes included on this report.  AMA assumes no liability for data contained or not contained herein. CPT is a Designer, television/film set of the [employer].

## 2013-12-09 NOTE — Patient Instructions (Addendum)
YOU HAD AN ENDOSCOPIC PROCEDURE TODAY AT [facility]: Refer to the procedure report that was given to you for any specific questions about what was found during the examination.  If the procedure report does not answer your questions, please call your gastroenterologist to clarify.  If you requested that your care partner not be given the details of your procedure findings, then the procedure report has been included in a sealed envelope for you to review at your convenience later.  YOU SHOULD EXPECT: Some feelings of bloating in the abdomen. Passage of more gas than usual.  Walking can help get rid of the air that was put into your GI tract during the procedure and reduce the bloating. If you had a lower endoscopy (such as a colonoscopy or flexible sigmoidoscopy) you may notice spotting of blood in your stool or on the toilet paper. If you underwent a bowel prep for your procedure, then you may not have a normal bowel movement for a few days.  DIET: follow instructions- nothing to eat or drink until 1:30 p.m today, then clear liquids from 1:30 p.m today until 1:30 p.m (12-10-13). Then full liquids from 1:30 (12-10-13) until 1:30 p.m on 12-11-13  ACTIVITY: Your care partner should take you home directly after the procedure.  You should plan to take it easy, moving slowly for the rest of the day.  You can resume normal activity the day after the procedure however you should NOT DRIVE or use heavy machinery for 24 hours (because of the sedation medicines used during the test).    SYMPTOMS TO REPORT IMMEDIATELY: A gastroenterologist can be reached at any hour.  During normal business hours, 8:30 AM to 5:00 PM Monday through Friday, call [phone].  After hours and on weekends, please call the GI answering service at [phone] who will take a message and have the physician on call contact you.   Following upper endoscopy (EGD)  Vomiting of blood or coffee ground material  New chest  pain or pain under the shoulder blades  Painful or persistently difficult swallowing  New shortness of breath  Fever of 100F or higher  Black, tarry-looking stools  FOLLOW UP: Our staff will call the home number listed on your records the next business day following your procedure to check on you and address any questions or concerns that you may have at that time regarding the information given to you following your procedure. This is a courtesy call and so if there is no answer at the home number and we have not heard from you through the emergency physician on call, we will assume that you have returned to your regular daily activities without incident.  SIGNATURES/CONFIDENTIALITY: You and/or your care partner have signed paperwork which will be entered into your electronic medical record.  These signatures attest to the fact that that the information above on your After Visit Summary has been reviewed and is understood.  Full responsibility of the confidentiality of this discharge information lies with you and/or your care-partner.  Very important to follow diet as instructed for the next few days  Avoid NSAIDS (Advil, Ibuprofen, Aleve, Motrin)  Please call office in the next few days to set up a follow up appointment for 4 weeks  Please read over handouts about anti-reflux regimen  Continue medications, including Nexium

## 2013-12-09 NOTE — Progress Notes (Signed)
Called to room to assist during endoscopic procedure.  Patient ID and intended procedure confirmed with present staff. Received instructions for my participation in the procedure from the performing physician.  

## 2013-12-10 ENCOUNTER — Telehealth: Payer: Self-pay | Admitting: Internal Medicine

## 2013-12-10 ENCOUNTER — Telehealth: Payer: Self-pay

## 2013-12-10 NOTE — Telephone Encounter (Signed)
  Follow up Call-  Call back number 12/09/2013  Post procedure Call Back phone  # [phone]  Permission to leave phone message Yes     Patient questions:  Do you have a fever, pain , or abdominal swelling? No. Pain Score  0 *  Have you tolerated food without any problems? Yes.    Have you been able to return to your normal activities? Yes.    Do you have any questions about your discharge instructions: Diet   No. Medications  No. Follow up visit  No.  Do you have questions or concerns about your Care? No.  Actions: * If pain score is 4 or above: No action needed, pain <4.

## 2013-12-10 NOTE — Telephone Encounter (Signed)
Pt called on call MD EGD yesterday with Dr. Fuller Plan, ulcer at pylorus, pyloric stenosis s/p balloon dilation to max of 12 mm with TTS balloon Had low grade fever yesterday to 99.9 Following diet as directed by Dr. Fuller Plan Today has felt very well.  Had jello and broth for lung, strained chicken soup and sherbet for dinner. No cough, dyspnea, chest pain, abd pain, nausea or vomiting Fever again now to 100.9, and mild aches, but no other complaints.  Nothing to suggest aspiration or procedural complication at present I recommended APAP 1000 mg every 6 hours as needed And to call me back if fever goes above 101.5 or should she develop cough, dyspnea, chest pain, abd pain, nausea or vomiting, or any other new complaint She voiced understanding and thanked me for the call

## 2013-12-13 ENCOUNTER — Ambulatory Visit: Payer: BC Managed Care – PPO | Admitting: Internal Medicine

## 2013-12-21 ENCOUNTER — Encounter: Payer: BC Managed Care – PPO | Admitting: Gastroenterology

## 2014-01-07 ENCOUNTER — Encounter: Payer: Self-pay | Admitting: *Deleted

## 2014-01-10 ENCOUNTER — Ambulatory Visit (INDEPENDENT_AMBULATORY_CARE_PROVIDER_SITE_OTHER): Payer: BC Managed Care – PPO | Admitting: Gastroenterology

## 2014-01-10 ENCOUNTER — Encounter: Payer: Self-pay | Admitting: Gastroenterology

## 2014-01-10 VITALS — BP 110/70 | HR 76 | Ht 59.0 in | Wt 164.0 lb

## 2014-01-10 DIAGNOSIS — K253 Acute gastric ulcer without hemorrhage or perforation: Secondary | ICD-10-CM

## 2014-01-10 DIAGNOSIS — K311 Adult hypertrophic pyloric stenosis: Secondary | ICD-10-CM

## 2014-01-10 MED ORDER — ESOMEPRAZOLE MAGNESIUM 40 MG PO CPDR: 40.0000 mg | DELAYED_RELEASE_CAPSULE | Freq: Every day | ORAL | Status: DC

## 2014-01-10 NOTE — Progress Notes (Signed)
    History of Present Illness: This is a 58 year old female accompanied by her husband. She had a pyloric channel ulcer with pyloric stenosis and a gastric outlet obstruction. Endoscopy as below. She has had complete resolution of her epigastric pain she has no GI complaints.  EGD on 12/09/2013: 1. Single ulcer 5 mm at the pylorus 2. 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 Anti-reflux regimen 2. Continue PPI BID, avoid all NSAIDs, clear liquid diet for 1 day, then advance to full liquids for 1 days, then solid diet  Current Medications, Allergies, Past Medical History, Past Surgical History, Family History and Social History were reviewed in [employer] record.  Physical Exam: General: Well developed , well nourished, no acute distress Head: Normocephalic and atraumatic Eyes:  sclerae anicteric, EOMI Ears: Normal auditory acuity Mouth: No deformity or lesions Lungs: Clear throughout to auscultation Heart: Regular rate and rhythm; no murmurs, rubs or bruits Abdomen: Soft, non tender and non distended. No masses, hepatosplenomegaly or hernias noted. Normal Bowel sounds Musculoskeletal: Symmetrical with no gross deformities  Pulses:  Normal pulses noted Extremities: No clubbing, cyanosis, edema or deformities noted Neurological: Alert oriented x 4, grossly nonfocal Psychological:  Alert and cooperative. Normal mood and affect  Assessment and Recommendations:  1. Gastric ulcer, acquired pyloric obstruction treated with balloon dilation. Symptoms resolved. Avoid ASA/NSAIDs. Nexium 40 mg daily. EGD to document ulcer healing and consider repeat dilation in January. The risks, benefits, and alternatives to endoscopy with possible biopsy and possible dilation were discussed with the patient and they consent to proceed.

## 2014-01-10 NOTE — Patient Instructions (Addendum)
You have been given a separate informational sheet regarding your tobacco use, the importance of quitting and local resources to help you quit.  You have been scheduled for an endoscopy. Please follow written instructions given to you at your visit today. If you use inhalers (even only as needed), please bring them with you on the day of your procedure. Your physician has requested that you go to www.startemmi.com and enter the access code given to you at your visit today. This web site gives a general overview about your procedure. However, you should still follow specific instructions given to you by our office regarding your preparation for the procedure.  We have sent the following prescriptions to your mail in pharmacy: Nexium 40 mg ONCE daily  If you have not heard from your mail in pharmacy within 1 week or if you have not received your medication in the mail, please contact us at [phone] so we may find out why.  You will be due for a recall colonoscopy in 06/2019. We will send you a reminder in the mail when it gets closer to that time.  CC: Viviana Simpler

## 2014-01-11 ENCOUNTER — Encounter: Payer: Self-pay | Admitting: Gastroenterology

## 2014-03-02 ENCOUNTER — Other Ambulatory Visit: Payer: Self-pay | Admitting: *Deleted

## 2014-03-02 MED ORDER — ALPRAZOLAM 0.25 MG PO TABS: 0.2500 mg | ORAL_TABLET | Freq: Two times a day (BID) | ORAL | Status: DC | PRN

## 2014-03-02 NOTE — Telephone Encounter (Signed)
12/01/13 Form on your desk to be signed and faxed back to [facility]

## 2014-03-02 NOTE — Telephone Encounter (Signed)
Form faxed back to [facility]

## 2014-03-02 NOTE — Telephone Encounter (Signed)
Approved: form done #180 x 0

## 2014-03-22 ENCOUNTER — Encounter: Payer: Self-pay | Admitting: Gastroenterology

## 2014-03-22 ENCOUNTER — Ambulatory Visit (AMBULATORY_SURGERY_CENTER): Payer: BLUE CROSS/BLUE SHIELD | Admitting: Gastroenterology

## 2014-03-22 VITALS — BP 95/67 | HR 59 | Temp 97.8°F | Resp 20 | Ht 59.0 in | Wt 164.0 lb

## 2014-03-22 DIAGNOSIS — K311 Adult hypertrophic pyloric stenosis: Secondary | ICD-10-CM

## 2014-03-22 DIAGNOSIS — K253 Acute gastric ulcer without hemorrhage or perforation: Secondary | ICD-10-CM

## 2014-03-22 MED ORDER — ESOMEPRAZOLE MAGNESIUM 40 MG PO PACK: 40.0000 mg | PACK | Freq: Two times a day (BID) | ORAL | Status: DC

## 2014-03-22 MED ORDER — SODIUM CHLORIDE 0.9 % IV SOLN: 500.0000 mL | INTRAVENOUS | Status: DC

## 2014-03-22 NOTE — Progress Notes (Signed)
Called to room to assist during endoscopic procedure.  Patient ID and intended procedure confirmed with present staff. Received instructions for my participation in the procedure from the performing physician.  

## 2014-03-22 NOTE — Progress Notes (Signed)
Procedure ends, to recovery, report given and VSS. 

## 2014-03-22 NOTE — Op Note (Signed)
[place]  [address]. [place], [id number]   ENDOSCOPY PROCEDURE REPORT  PATIENT: Shelly, Hood  MR#: [id number] BIRTHDATE: [birth year] , 58  yrs. old GENDER: female ENDOSCOPIST: Ladene Artist, MD, [facility] PROCEDURE DATE:  03/22/2014 PROCEDURE:  EGD w/ balloon dilation ASA CLASS:     Class II INDICATIONS:  Follow up gastric ulcer and pyloric stenosis. MEDICATIONS: Propofol 200 mg IV TOPICAL ANESTHETIC: none DESCRIPTION OF PROCEDURE: After the risks benefits and alternatives of the procedure were thoroughly explained, informed consent was obtained.  The LB [id number] endoscope was introduced through the mouth and advanced to the second portion of the duodenum , limited by retained food in body and fundus.  The instrument was slowly withdrawn as the mucosa was fully examined.    STOMACH: A stenosis traversable after dilation was found at the pylorus. The lumen measured about 8 mm.  A small ulcer noted at the pyloric channel.  Using a TTS-balloon the stricture was dilated up to 59mm.  The balloon was held inflated for 30 seconds.  Following this dilation, there was a small mucosal rent.   The stomach otherwise appeared normal except for retained solids. DUODENUM: The duodenal mucosa showed no abnormalities in the bulb and 2nd part of the duodenum. ESOPHAGUS: The mucosa of the esophagus appeared normal.  Retroflexed views revealed no abnormalities.     The scope was then withdrawn from the patient and the procedure completed.  COMPLICATIONS: There were no immediate complications.  ENDOSCOPIC IMPRESSION: 1.   Stenosis at the pylorus; dilated up to 12 mm 2.   Small healing pyloric channel ulcer 3.   The EGD otherwise appeared normal  RECOMMENDATIONS: 1.  Clear liquids today then full liquids tomorrow, then resume regular diet 2.  Nexium 40 mg po bid 3.  Avoid ASA/NSAIDs 4.  Post dilation instructions 5.  Follow-up o.v. in 4-6 weeks  eSigned:   Ladene Artist, MD, [facility] 03/22/2014 8:34 AM

## 2014-03-22 NOTE — Patient Instructions (Addendum)

## 2014-03-23 ENCOUNTER — Telehealth: Payer: Self-pay | Admitting: *Deleted

## 2014-03-23 NOTE — Telephone Encounter (Signed)
  Follow up Call-  Call back number 03/22/2014 12/09/2013  Post procedure Call Back phone  # [phone]  Permission to leave phone message Yes Yes     Patient questions:  Do you have a fever, pain , or abdominal swelling? No. Pain Score  0 *  Have you tolerated food without any problems? Yes.    Have you been able to return to your normal activities? Yes.    Do you have any questions about your discharge instructions: Diet   No. Medications  No. Follow up visit  No.  Do you have questions or concerns about your Care? No.  Actions: * If pain score is 4 or above: No action needed, pain <4.

## 2014-04-22 ENCOUNTER — Ambulatory Visit (INDEPENDENT_AMBULATORY_CARE_PROVIDER_SITE_OTHER): Payer: BLUE CROSS/BLUE SHIELD | Admitting: Gastroenterology

## 2014-04-22 ENCOUNTER — Encounter: Payer: Self-pay | Admitting: Gastroenterology

## 2014-04-22 VITALS — BP 116/72 | HR 68 | Ht 59.0 in | Wt 163.4 lb

## 2014-04-22 DIAGNOSIS — K219 Gastro-esophageal reflux disease without esophagitis: Secondary | ICD-10-CM

## 2014-04-22 DIAGNOSIS — K259 Gastric ulcer, unspecified as acute or chronic, without hemorrhage or perforation: Secondary | ICD-10-CM

## 2014-04-22 DIAGNOSIS — K311 Adult hypertrophic pyloric stenosis: Secondary | ICD-10-CM

## 2014-04-22 NOTE — Progress Notes (Signed)
History of Present Illness: This is a 59 year old female here today with her husband and daughter. She relates no improvement in symptoms following her last pyloric channel dilation. At endoscopy she appeared to have a good result on the day of the procedure. She has postprandial bloating and discomfort with frequent reflux symptoms. She did have a response to her first balloon dilation which lasted about 2 months.  EGD 03/2014  1. Stenosis at the pylorus; dilated up to 12 mm 2. Small healing pyloric channel ulcer 3. The EGD otherwise appeared normal  Allergies  Allergen Reactions  . Codeine Sulfate     REACTION: nausea only tolerates hydrocodone   Outpatient Prescriptions Prior to Visit  Medication Sig Dispense Refill  . ALPRAZolam (XANAX) 0.25 MG tablet Take 1 tablet (0.25 mg total) by mouth 2 (two) times daily as needed for sleep or anxiety. 180 tablet 0  . atorvastatin (LIPITOR) 20 MG tablet Take 1 tablet (20 mg total) by mouth daily at 6 PM. 90 tablet 3  . calcium-vitamin D (OSCAL WITH D) 500-200 MG-UNIT per tablet Take 1 tablet by mouth daily.    Marland Kitchen esomeprazole (NEXIUM) 40 MG packet Take 40 mg by mouth 2 (two) times daily. 180 each 3  . FLUoxetine (PROZAC) 40 MG capsule Take 1 capsule (40 mg total) by mouth daily. 90 capsule 3   No facility-administered medications prior to visit.   Past Medical History  Diagnosis Date  . Panic attacks   . High cholesterol   . Obesity   . Gastric outlet obstruction   . Pyloric ulcer   . Diverticulosis   . Depression   . GERD (gastroesophageal reflux disease)   . Pyloric stenosis    Past Surgical History  Procedure Laterality Date  . Rhinoplasty      X3   History   Social History  . Marital Status: Married    Spouse Name: N/A  . Number of Children: 2  . Years of Education: N/A   Occupational History  . Real estate management     [employer] [place] Top[place]  . Smoking status: Current Every Day  Smoker -- 0.25 packs/day    Types: Cigarettes  . Smokeless tobacco: Never Used     Comment: 2 packs per week  . Alcohol Use: No  . Drug Use: No  . Sexual Activity:    Partners: Male    Birth Control/ Protection: None   Other Topics Concern  . None   Social History Narrative   Family History  Problem Relation Age of Onset  . Diabetes Mother   . Diabetes Maternal Grandmother   . Heart disease Father        Physical Exam: General: Well developed , well nourished, no acute distress Head: Normocephalic and atraumatic Eyes:  sclerae anicteric, EOMI Ears: Normal auditory acuity Mouth: No deformity or lesions Lungs: Clear throughout to auscultation Heart: Regular rate and rhythm; no murmurs, rubs or bruits Abdomen: Soft, non tender and non distended. No masses, hepatosplenomegaly or hernias noted. Normal Bowel sounds Musculoskeletal: Symmetrical with no gross deformities  Pulses:  Normal pulses noted Extremities: No clubbing, cyanosis, edema or deformities noted Neurological: Alert oriented x 4, grossly nonfocal Psychological:  Alert and cooperative. Normal mood and affect  Assessment and Recommendations:  1. Pyloric ulcer and pyloric stenosis, status post dilation. GERD exacerbated by GOO. Unfortunately her GOO did not respond to the last dilation. Continue Nexium 40 mg twice daily long-term. Avoid  or at least minimize aspirin and NSAID usage long-term. Low fiber diet. Surgical consult for management of a nonhealing pyloric channel ulcer associated with pyloric stenosis. Consider UGI series to further evaluate-defer to surgical consult.

## 2014-04-22 NOTE — Patient Instructions (Addendum)
You have been scheduled for an appointment with Dr. Johney [place] at [facility]. Your appointment is on  at _______________. Please arrive at __________________ for registration. Make certain to bring a list of current medications, including any over the counter medications or vitamins. Also bring your co-pay if you have one as well as your insurance cards. [facility] is located at [address], [address]. Should you need to reschedule your appointment, please contact them at [phone].  Low-Fiber Diet Fiber is found in fruits, vegetables, and whole grains. A low-fiber diet restricts fibrous foods that are not digested in the small intestine. A diet containing about 10-15 grams of fiber per day is considered low fiber. Low-fiber diets may be used to:  Promote healing and rest the bowel during intestinal flare-ups.  Prevent blockage of a partially obstructed or narrowed gastrointestinal tract.  Reduce fecal weight and volume.  Slow the movement of feces. You may be on a low-fiber diet as a transitional diet following surgery, after an injury (trauma), or because of a short (acute) or lifelong (chronic) illness. Your health care provider will determine the length of time you need to stay on this diet.  WHAT DO I NEED TO KNOW ABOUT A LOW-FIBER DIET? Always check the fiber content on the packaging's Nutrition Facts label, especially on foods from the grains list. Ask your dietitian if you have questions about specific foods that are related to your condition, especially if the food is not listed below. In general, a low-fiber food will have less than 2 g of fiber. WHAT FOODS CAN I EAT? Grains All breads and crackers made with white flour. Sweet rolls, doughnuts, waffles, pancakes, [place] toast, bagels. Pretzels, Melba toast, zwieback. Well-cooked cereals, such as cornmeal, farina, or cream cereals. Dry cereals that do not contain whole grains, fruit, or nuts, such as  refined corn, wheat, rice, and oat cereals. Potatoes prepared any way without skins, plain pastas and noodles, refined white rice. Use white flour for baking and making sauces. Use allowed list of grains for casseroles, dumplings, and puddings.  Vegetables Strained tomato and vegetable juices. Fresh lettuce, cucumber, spinach. Well-cooked (no skin or pulp) or canned vegetables, such as asparagus, bean sprouts, beets, carrots, green beans, mushrooms, potatoes, pumpkin, spinach, yellow squash, tomato sauce/puree, turnips, yams, and zucchini. Keep servings limited to  cup.  Fruits All fruit juices except prune juice. Cooked or canned fruits without skin and seeds, such as applesauce, apricots, cherries, fruit cocktail, grapefruit, grapes, mandarin oranges, melons, peaches, pears, pineapple, and plums. Fresh fruits without skin, such as apricots, avocados, bananas, melons, pineapple, nectarines, and peaches. Keep servings limited to  cup or 1 piece.  Meat and Other Protein Sources Ground or well-cooked tender beef, ham, veal, lamb, pork, or poultry. Eggs, plain cheese. Fish, oysters, shrimp, lobster, and other seafood. Liver, organ meats. Smooth nut butters. Dairy All milk products and alternative dairy substitutes, such as soy, rice, almond, and coconut, not containing added whole nuts, seeds, or added fruit. Beverages Decaf coffee, fruit, and vegetable juices or smoothies (small amounts, with no pulp or skins, and with fruits from allowed list), sports drinks, herbal tea. Condiments Ketchup, mustard, vinegar, cream sauce, cheese sauce, cocoa powder. Spices in moderation, such as allspice, basil, bay leaves, celery powder or leaves, cinnamon, cumin powder, curry powder, ginger, mace, marjoram, onion or garlic powder, oregano, paprika, parsley flakes, ground pepper, rosemary, sage, savory, tarragon, thyme, and turmeric. Sweets and Desserts Plain cakes and cookies, pie made with  allowed fruit, pudding,  custard, cream pie. Gelatin, fruit, ice, sherbet, frozen ice pops. Ice cream, ice milk without nuts. Plain hard candy, honey, jelly, molasses, syrup, sugar, chocolate syrup, gumdrops, marshmallows. Limit overall sugar intake.  Fats and Oil Margarine, butter, cream, mayonnaise, salad oils, plain salad dressings made from allowed foods. Choose healthy fats such as olive oil, canola oil, and omega-3 fatty acids (such as found in salmon or tuna) when possible.  Other Bouillon, broth, or cream soups made from allowed foods. Any strained soup. Casseroles or mixed dishes made with allowed foods. The items listed above may not be a complete list of recommended foods or beverages. Contact your dietitian for more options.  WHAT FOODS ARE NOT RECOMMENDED? Grains All whole wheat and whole grain breads and crackers. Multigrains, rye, bran seeds, nuts, or coconut. Cereals containing whole grains, multigrains, bran, coconut, nuts, raisins. Cooked or dry oatmeal, steel-cut oats. Coarse wheat cereals, granola. Cereals advertised as high fiber. Potato skins. Whole grain pasta, wild or brown rice. Popcorn. Coconut flour. Bran, buckwheat, corn bread, multigrains, rye, wheat germ.  Vegetables Fresh, cooked or canned vegetables, such as artichokes, asparagus, beet greens, broccoli, Brussels sprouts, cabbage, celery, cauliflower, corn, eggplant, kale, legumes or beans, okra, peas, and tomatoes. Avoid large servings of any vegetables, especially raw vegetables.  Fruits Fresh fruits, such as apples with or without skin, berries, cherries, figs, grapes, grapefruit, guavas, kiwis, mangoes, oranges, papayas, pears, persimmons, pineapple, and pomegranate. Prune juice and juices with pulp, stewed or dried prunes. Dried fruits, dates, raisins. Fruit seeds or skins. Avoid large servings of all fresh fruits. Meats and Other Protein Sources Tough, fibrous meats with gristle. Chunky nut butter. Cheese made with seeds, nuts, or other  foods not recommended. Nuts, seeds, legumes (beans, including baked beans), dried peas, beans, lentils.  Dairy Yogurt or cheese that contains nuts, seeds, or added fruit.  Beverages Fruit juices with high pulp, prune juice. Caffeinated coffee and teas.  Condiments Coconut, maple syrup, pickles, olives. Sweets and Desserts Desserts, cookies, or candies that contain nuts or coconut, chunky peanut butter, dried fruits. Jams, preserves with seeds, marmalade. Large amounts of sugar and sweets. Any other dessert made with fruits from the not recommended list.  Other Soups made from vegetables that are not recommended or that contain other foods not recommended.  The items listed above may not be a complete list of foods and beverages to avoid. Contact your dietitian for more information. Document Released: 08/17/2001 Document Revised: 03/02/2013 Document Reviewed: 01/18/2013 [facility] Patient Information 2015 [place], [place]. This information is not intended to replace advice given to you by your health care provider. Make sure you discuss any questions you have with your health care provider.  cc: Michael Boston, MD

## 2014-04-27 ENCOUNTER — Encounter: Payer: Self-pay | Admitting: Internal Medicine

## 2014-04-27 ENCOUNTER — Ambulatory Visit (INDEPENDENT_AMBULATORY_CARE_PROVIDER_SITE_OTHER): Payer: BLUE CROSS/BLUE SHIELD | Admitting: Internal Medicine

## 2014-04-27 ENCOUNTER — Telehealth: Payer: Self-pay | Admitting: *Deleted

## 2014-04-27 ENCOUNTER — Encounter: Payer: Self-pay | Admitting: *Deleted

## 2014-04-27 VITALS — BP 122/70 | HR 74 | Temp 98.2°F | Ht 60.0 in | Wt 163.2 lb

## 2014-04-27 DIAGNOSIS — F419 Anxiety disorder, unspecified: Secondary | ICD-10-CM

## 2014-04-27 DIAGNOSIS — E785 Hyperlipidemia, unspecified: Secondary | ICD-10-CM

## 2014-04-27 DIAGNOSIS — K253 Acute gastric ulcer without hemorrhage or perforation: Secondary | ICD-10-CM

## 2014-04-27 DIAGNOSIS — Z0000 Encounter for general adult medical examination without abnormal findings: Secondary | ICD-10-CM

## 2014-04-27 LAB — CBC WITH DIFFERENTIAL/PLATELET
BASOS ABS: 0 10*3/uL (ref 0.0–0.1)
BASOS PCT: 0.5 % (ref 0.0–3.0)
EOS ABS: 0.1 10*3/uL (ref 0.0–0.7)
EOS PCT: 0.9 % (ref 0.0–5.0)
HCT: 41.6 % (ref 36.0–46.0)
Hemoglobin: 14.2 g/dL (ref 12.0–15.0)
LYMPHS ABS: 2 10*3/uL (ref 0.7–4.0)
Lymphocytes Relative: 26.7 % (ref 12.0–46.0)
MCHC: 34.3 g/dL (ref 30.0–36.0)
MCV: 88 fl (ref 78.0–100.0)
MONOS PCT: 7.3 % (ref 3.0–12.0)
Monocytes Absolute: 0.5 10*3/uL (ref 0.1–1.0)
NEUTROS PCT: 64.6 % (ref 43.0–77.0)
Neutro Abs: 4.8 10*3/uL (ref 1.4–7.7)
PLATELETS: 260 10*3/uL (ref 150.0–400.0)
RBC: 4.72 Mil/uL (ref 3.87–5.11)
RDW: 13.5 % (ref 11.5–15.5)
WBC: 7.4 10*3/uL (ref 4.0–10.5)

## 2014-04-27 LAB — COMPREHENSIVE METABOLIC PANEL
ALK PHOS: 91 U/L (ref 39–117)
ALT: 31 U/L (ref 0–35)
AST: 20 U/L (ref 0–37)
Albumin: 4 g/dL (ref 3.5–5.2)
BUN: 8 mg/dL (ref 6–23)
CO2: 26 mEq/L (ref 19–32)
Calcium: 9.5 mg/dL (ref 8.4–10.5)
Chloride: 104 mEq/L (ref 96–112)
Creatinine, Ser: 0.79 mg/dL (ref 0.40–1.20)
GFR: 79.94 mL/min (ref >60.00)
Glucose, Bld: 98 mg/dL (ref 70–99)
POTASSIUM: 4.8 meq/L (ref 3.5–5.1)
SODIUM: 138 meq/L (ref 135–145)
Total Bilirubin: 0.4 mg/dL (ref 0.2–1.2)
Total Protein: 6.9 g/dL (ref 6.0–8.3)

## 2014-04-27 LAB — T4, FREE: FREE T4: 0.78 ng/dL (ref 0.60–1.60)

## 2014-04-27 LAB — LIPID PANEL
CHOLESTEROL: 205 mg/dL — AB (ref 0–200)
HDL: 34.3 mg/dL — ABNORMAL LOW (ref >39.00)
NonHDL: 170.7
Total CHOL/HDL Ratio: 6
Triglycerides: 384 mg/dL — ABNORMAL HIGH (ref 0.0–149.0)
VLDL: 76.8 mg/dL — ABNORMAL HIGH (ref 0.0–40.0)

## 2014-04-27 LAB — LDL CHOLESTEROL, DIRECT: Direct LDL: 107 mg/dL

## 2014-04-27 MED ORDER — ESOMEPRAZOLE MAGNESIUM 40 MG PO PACK: 40.0000 mg | PACK | Freq: Two times a day (BID) | ORAL | Status: DC

## 2014-04-27 MED ORDER — ALPRAZOLAM 0.25 MG PO TABS: 0.2500 mg | ORAL_TABLET | Freq: Two times a day (BID) | ORAL | Status: DC | PRN

## 2014-04-27 MED ORDER — ATORVASTATIN CALCIUM 20 MG PO TABS: 20.0000 mg | ORAL_TABLET | Freq: Every day | ORAL | Status: DC

## 2014-04-27 MED ORDER — FLUOXETINE HCL 40 MG PO CAPS: 40.0000 mg | ORAL_CAPSULE | Freq: Every day | ORAL | Status: DC

## 2014-04-27 NOTE — Addendum Note (Signed)
Addended by: Modena Nunnery on: 04/27/2014 09:18 AM   Modules accepted: Orders

## 2014-04-27 NOTE — Progress Notes (Signed)
Pre visit review using our clinic review tool, if applicable. No additional management support is needed unless otherwise documented below in the visit note. 

## 2014-04-27 NOTE — Telephone Encounter (Signed)
Pt was here for CPE. Xanax sent to mail order in error. Pt is due for refill. Last Rx 12/23and pt is due but states that she does not need a Rx at this time.

## 2014-04-27 NOTE — Assessment & Plan Note (Signed)
She is satisfied with primary prevention with the statin

## 2014-04-27 NOTE — Progress Notes (Signed)
Subjective:    Patient ID: Shelly Hood, female    DOB: [birth year], 57 y.o.   MRN: [id number]  HPI Here for physical  Ongoing GI problems Not getting better despite dilation and treatment for pyloric ulcer Diarrhea every morning-- like 7-8 times Scheduled to see surgeon Discussed that she needs to finally stop smoking completely  Still using the alprazolam every morning Busy at work--things going well Continues on the fluoxetine No depression--just panic and anxiety  No problems with cholesterol med  Current Outpatient Prescriptions on File Prior to Visit  Medication Sig Dispense Refill  . ALPRAZolam (XANAX) 0.25 MG tablet Take 1 tablet (0.25 mg total) by mouth 2 (two) times daily as needed for sleep or anxiety. 180 tablet 0  . atorvastatin (LIPITOR) 20 MG tablet Take 1 tablet (20 mg total) by mouth daily at 6 PM. 90 tablet 3  . calcium-vitamin D (OSCAL WITH D) 500-200 MG-UNIT per tablet Take 1 tablet by mouth daily.    Marland Kitchen esomeprazole (NEXIUM) 40 MG packet Take 40 mg by mouth 2 (two) times daily. 180 each 3  . FLUoxetine (PROZAC) 40 MG capsule Take 1 capsule (40 mg total) by mouth daily. 90 capsule 3   No current facility-administered medications on file prior to visit.    Allergies  Allergen Reactions  . Codeine Sulfate     REACTION: nausea only tolerates hydrocodone    Past Medical History  Diagnosis Date  . Panic attacks   . High cholesterol   . Obesity   . Gastric outlet obstruction   . Pyloric ulcer   . Diverticulosis   . Depression   . GERD (gastroesophageal reflux disease)   . Pyloric stenosis     Past Surgical History  Procedure Laterality Date  . Rhinoplasty      X3    Family History  Problem Relation Age of Onset  . Diabetes Mother   . Diabetes Maternal Grandmother   . Heart disease Father     History   Social History  . Marital Status: Married    Spouse Name: N/A  . Number of Children: 2  . Years of Education: N/A   Occupational  History  . 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 Smoking status: Current Every Day Smoker -- 0.25 packs/day    Types: Cigarettes  . Smokeless tobacco: Never Used     Comment: 2 packs per week  . Alcohol Use: No  . Drug Use: No  . Sexual Activity:    Partners: Male    Birth Control/ Protection: None   Other Topics Concern  . Not on file   Social History Narrative   Review of Systems  Constitutional: Negative for fatigue and unexpected weight change.       Wears seat belt  HENT: Negative for dental problem, hearing loss and tinnitus.        Regular with dentist  Eyes: Negative for visual disturbance.       No diplopia or unilateral vision loss  Gastrointestinal: Positive for diarrhea. Negative for nausea, vomiting, blood in stool and anal bleeding.  Endocrine: Negative for polydipsia and polyuria.  Genitourinary: Negative for dysuria, difficulty urinating and dyspareunia.       Mild stress incontinence still  Musculoskeletal: Negative for back pain, joint swelling and arthralgias.  Skin: Negative for rash.       Dry skin Tags around neck  Allergic/Immunologic: Negative for environmental allergies  and immunocompromised state.  Neurological: Negative for dizziness, syncope, weakness, light-headedness, numbness and headaches.  Hematological: Negative for adenopathy. Does not bruise/bleed easily.  Psychiatric/Behavioral: Positive for sleep disturbance. Negative for dysphoric mood. The patient is nervous/anxious.        Objective:   Physical Exam  Constitutional: She is oriented to person, place, and time. She appears well-nourished. No distress.  HENT:  Head: Normocephalic.  Right Ear: External ear normal.  Mouth/Throat: Oropharynx is clear and moist. No oropharyngeal exudate.  Eyes: Conjunctivae and EOM are normal. Pupils are equal, round, and reactive to light.  Neck: Normal range of motion. Neck supple. No thyromegaly present.   Cardiovascular: Normal rate, regular rhythm, normal heart sounds and intact distal pulses.  Exam reveals no gallop.   No murmur heard. Pulmonary/Chest: Effort normal and breath sounds normal. No respiratory distress. She has no wheezes. She has no rales.  Abdominal: Soft. There is no tenderness.  Musculoskeletal: She exhibits no edema or tenderness.  Lymphadenopathy:    She has no cervical adenopathy.  Neurological: She is alert and oriented to person, place, and time.  Skin: No rash noted. No erythema.  Psychiatric: She has a normal mood and affect. Her behavior is normal.          Assessment & Plan:

## 2014-04-27 NOTE — Assessment & Plan Note (Signed)
Controlled with her meds

## 2014-04-27 NOTE — Patient Instructions (Signed)
Please call 1-800 QUIT NOW for help in stopping smoking.  DASH Eating Plan DASH stands for "Dietary Approaches to Stop Hypertension." The DASH eating plan is a healthy eating plan that has been shown to reduce high blood pressure (hypertension). Additional health benefits may include reducing the risk of type 2 diabetes mellitus, heart disease, and stroke. The DASH eating plan may also help with weight loss. WHAT DO I NEED TO KNOW ABOUT THE DASH EATING PLAN? For the DASH eating plan, you will follow these general guidelines:  Choose foods with a percent daily value for sodium of less than 5% (as listed on the food label).  Use salt-free seasonings or herbs instead of table salt or sea salt.  Check with your health care provider or pharmacist before using salt substitutes.  Eat lower-sodium products, often labeled as "lower sodium" or "no salt added."  Eat fresh foods.  Eat more vegetables, fruits, and low-fat dairy products.  Choose whole grains. Look for the word "whole" as the first word in the ingredient list.  Choose fish and skinless chicken or [place] more often than red meat. Limit fish, poultry, and meat to 6 oz (170 g) each day.  Limit sweets, desserts, sugars, and sugary drinks.  Choose heart-healthy fats.  Limit cheese to 1 oz (28 g) per day.  Eat more home-cooked food and less restaurant, buffet, and fast food.  Limit fried foods.  Cook foods using methods other than frying.  Limit canned vegetables. If you do use them, rinse them well to decrease the sodium.  When eating at a restaurant, ask that your food be prepared with less salt, or no salt if possible. WHAT FOODS CAN I EAT? Seek help from a dietitian for individual calorie needs. Grains Whole grain or whole wheat bread. Brown rice. Whole grain or whole wheat pasta. Quinoa, bulgur, and whole grain cereals. Low-sodium cereals. Corn or whole wheat flour tortillas. Whole grain cornbread. Whole grain crackers.  Low-sodium crackers. Vegetables Fresh or frozen vegetables (raw, steamed, roasted, or grilled). Low-sodium or reduced-sodium tomato and vegetable juices. Low-sodium or reduced-sodium tomato sauce and paste. Low-sodium or reduced-sodium canned vegetables.  Fruits All fresh, canned (in natural juice), or frozen fruits. Meat and Other Protein Products Ground beef (85% or leaner), grass-fed beef, or beef trimmed of fat. Skinless chicken or [place]. Ground chicken or [place]. Pork trimmed of fat. All fish and seafood. Eggs. Dried beans, peas, or lentils. Unsalted nuts and seeds. Unsalted canned beans. Dairy Low-fat dairy products, such as skim or 1% milk, 2% or reduced-fat cheeses, low-fat ricotta or cottage cheese, or plain low-fat yogurt. Low-sodium or reduced-sodium cheeses. Fats and Oils Tub margarines without trans fats. Light or reduced-fat mayonnaise and salad dressings (reduced sodium). Avocado. Safflower, olive, or canola oils. Natural peanut or almond butter. Other Unsalted popcorn and pretzels. The items listed above may not be a complete list of recommended foods or beverages. Contact your dietitian for more options. WHAT FOODS ARE NOT RECOMMENDED? Grains White bread. White pasta. White rice. Refined cornbread. Bagels and croissants. Crackers that contain trans fat. Vegetables Creamed or fried vegetables. Vegetables in a cheese sauce. Regular canned vegetables. Regular canned tomato sauce and paste. Regular tomato and vegetable juices. Fruits Dried fruits. Canned fruit in light or heavy syrup. Fruit juice. Meat and Other Protein Products Fatty cuts of meat. Ribs, chicken wings, bacon, sausage, bologna, salami, chitterlings, fatback, hot dogs, bratwurst, and packaged luncheon meats. Salted nuts and seeds. Canned beans with salt. Dairy Whole or 2%  milk, cream, half-and-half, and cream cheese. Whole-fat or sweetened yogurt. Full-fat cheeses or blue cheese. Nondairy creamers and whipped  toppings. Processed cheese, cheese spreads, or cheese curds. Condiments Onion and garlic salt, seasoned salt, table salt, and sea salt. Canned and packaged gravies. Worcestershire sauce. Tartar sauce. Barbecue sauce. Teriyaki sauce. Soy sauce, including reduced sodium. Steak sauce. Fish sauce. Oyster sauce. Cocktail sauce. Horseradish. Ketchup and mustard. Meat flavorings and tenderizers. Bouillon cubes. Hot sauce. Tabasco sauce. Marinades. Taco seasonings. Relishes. Fats and Oils Butter, stick margarine, lard, shortening, ghee, and bacon fat. Coconut, palm kernel, or palm oils. Regular salad dressings. Other Pickles and olives. Salted popcorn and pretzels. The items listed above may not be a complete list of foods and beverages to avoid. Contact your dietitian for more information. WHERE CAN I FIND MORE INFORMATION? National Heart, Lung, and Blood Institute: travelstabloid.com Document Released: 02/14/2011 Document Revised: 07/12/2013 Document Reviewed: 12/30/2012 [facility] Patient Information 2015 [place], [place]. This information is not intended to replace advice given to you by your health care provider. Make sure you discuss any questions you have with your health care provider.

## 2014-04-27 NOTE — Assessment & Plan Note (Signed)
Healthy but ongoing GI issues May need ulcer surgery UTD with Td, mammo, pap, colonoscopy Discussed fitness Needs to stop smoking

## 2014-04-28 ENCOUNTER — Telehealth: Payer: Self-pay | Admitting: Internal Medicine

## 2014-04-28 NOTE — Telephone Encounter (Signed)
emmi emailed °

## 2014-05-04 ENCOUNTER — Encounter (INDEPENDENT_AMBULATORY_CARE_PROVIDER_SITE_OTHER): Payer: Self-pay | Admitting: Surgery

## 2014-05-04 ENCOUNTER — Other Ambulatory Visit (INDEPENDENT_AMBULATORY_CARE_PROVIDER_SITE_OTHER): Payer: Self-pay | Admitting: Surgery

## 2014-05-04 ENCOUNTER — Other Ambulatory Visit (INDEPENDENT_AMBULATORY_CARE_PROVIDER_SITE_OTHER): Payer: Self-pay | Admitting: *Deleted

## 2014-05-04 DIAGNOSIS — K311 Adult hypertrophic pyloric stenosis: Secondary | ICD-10-CM | POA: Insufficient documentation

## 2014-05-04 DIAGNOSIS — K259 Gastric ulcer, unspecified as acute or chronic, without hemorrhage or perforation: Secondary | ICD-10-CM

## 2014-05-04 NOTE — Addendum Note (Signed)
Addended by: [name] on: 05/04/2014 12:11 PM   Modules accepted: Orders

## 2014-05-04 NOTE — Progress Notes (Signed)
Agree with plans.  Thank you.

## 2014-05-04 NOTE — H&P (Signed)
Shelly Hood 05/04/2014 11:05 AM Location: [facility] Patient #: [id number] DOB: 04-Dec-1955 Married / Language: Shelly Hood / Race: Undefined Female History of Present Illness [name] MD; 05/04/2014 11:42 AM) Patient words: gastric ulcer.  The patient is a 59 year old female who presents with a gastric outlet obstruction. Patient sent by her gastroenterologist, Dr. 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 Pleasant active woman. Here today with her husband. Smoker but nearly quitting. Has had worsening heartburn and reflux for several years. Was placed on omeprazole. Initially seemed to help. However had worsening belching and heartburn last summer. Then began to have nausea and vomiting after eating larger meals. Switch to Nexium. Persisted. Sent to gastroenterology. EGD done revealed smooth benign-appearing stricture at pylorus. Balloon dilation Dial done. She said that helped for a few weeks. However she began to have recurrent symptoms. Endoscopy and balloon dilation repeated last month. Only helped for a few days. Based on concerns, surgical consultation requested to see if resection needed. 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 Very physically active. Smokes less than a half pack a day. Normally has bowel movements every day but lately has had more loose stools. His had dwindling tolerance to larger meals. Can tolerate most liquids and pured foods but larger meals especially at night and cause a lot of pain. Increasing belching. Often more foul now. She's not had any abdominal surgeries. No history of Crohn's. No ulcerative colitis. She does not need remember any history of prior ulcer disease. She rarely drinks coffee or has citrus. She only occasionally drinks soda pop - small cans at most Other Problems [name], LPN; 7/62/8315 17:61 AM) Anxiety Disorder Gastric  Ulcer Gastroesophageal Reflux Disease Hypercholesterolemia  Diagnostic Studies History [name], LPN; 08/15/3708 62:69 AM) Colonoscopy 5-10 years ago Mammogram within last year Pap Smear 1-5 years ago  Allergies [name], LPN; 4/85/4627 03:50 AM) Codeine Phosphate *ANALGESICS - OPIOID* Nausea.  Medication History [name], LPN; 0/93/8182 99:37 AM) ALPRAZolam (0.25MG  Tablet, Oral) Active. Atorvastatin Calcium (20MG  Tablet, Oral) Active. NexIUM (40MG  Capsule DR, Oral) Active. FLUoxetine HCl (40MG  Capsule, Oral) Active. Oscal 500/200 D-3 (500-200MG -UNIT Tablet, Oral) Active.  Social History [name], LPN; 1/69/6789 38:10 AM) Caffeine use Coffee, Tea. No alcohol use No drug use Tobacco use Current every day smoker.  Family History [name], LPN; 1/75/1025 85:27 AM) Diabetes Mellitus Brother, Mother. Heart Disease Brother.  Pregnancy / Birth History [name], LPN; 7/82/4235 36:14 AM) Age at menarche 50 years. Age of menopause <47 Gravida 2 Irregular periods Maternal age 18-20 Para 2     Review of Systems [name] LPN; 4/31/5400 86:76 AM) General Not Present- Appetite Loss, Chills, Fatigue, Fever, Night Sweats, Weight Gain and Weight Loss. Skin Present- Dryness. Not Present- Change in Wart/Mole, Hives, Jaundice, New Lesions, Non-Healing Wounds, Rash and Ulcer. HEENT Present- Wears glasses/contact lenses. Not Present- Earache, Hearing Loss, Hoarseness, Nose Bleed, Oral Ulcers, Ringing in the Ears, Seasonal Allergies, Sinus Pain, Sore Throat, Visual Disturbances and Yellow Eyes. Respiratory Present- Snoring. Not Present- Bloody sputum, Chronic Cough, Difficulty Breathing and Wheezing. Breast Not Present- Breast Mass, Breast Pain, Nipple Discharge and Skin Changes. Cardiovascular Not Present- Chest Pain, Difficulty Breathing Lying Down, Leg Cramps, Palpitations, Rapid Heart Rate, Shortness of Breath and Swelling of  Extremities. Gastrointestinal Present- Bloating, Excessive gas, Gets full quickly at meals and Indigestion. Not Present- Abdominal Pain, Bloody Stool, Change in Bowel Habits, Chronic diarrhea, Constipation, Difficulty Swallowing, Hemorrhoids,  Nausea, Rectal Pain and Vomiting. Female Genitourinary Not Present- Frequency, Nocturia, Painful Urination, Pelvic Pain and Urgency. Musculoskeletal Not Present- Back Pain, Joint Pain, Joint Stiffness, Muscle Pain, Muscle Weakness and Swelling of Extremities. Neurological Not Present- Decreased Memory, Fainting, Headaches, Numbness, Seizures, Tingling, Tremor, Trouble walking and Weakness. Psychiatric Present- Anxiety. Not Present- Bipolar, Change in Sleep Pattern, Depression, Fearful and Frequent crying. Endocrine Present- Hot flashes. Not Present- Cold Intolerance, Excessive Hunger, Hair Changes, Heat Intolerance and New Diabetes. Hematology Not Present- Easy Bruising, Excessive bleeding, Gland problems, HIV and Persistent Infections.  Vitals Claiborne Billings Dockery LPN; 3/81/8299 37:16 AM) 05/04/2014 11:07 AM Weight: 162 lb Height: 59in Body Surface Area: 1.75 m Body Mass Index: 32.72 kg/m Temp.: 98.80F  Pulse: 82 (Regular)  Resp.: 16 (Unlabored)  BP: 104/68 (Sitting, Left Arm, Standard)     Physical Exam [name] MD; 05/04/2014 11:39 AM)  General Mental Status-Alert. General Appearance-Not in acute distress, Not Sickly. Orientation-Oriented X3. Hydration-Well hydrated. Voice-Normal. Note: Energetic/perky. Pleasant.   Integumentary Global Assessment Upon inspection and palpation of skin surfaces of the - Axillae: non-tender, no inflammation or ulceration, no drainage. and Distribution of scalp and body hair is normal. General Characteristics Temperature - normal warmth is noted.  Head and Neck Head-normocephalic, atraumatic with no lesions or palpable masses. Face Global Assessment - atraumatic, no absence of  expression. Neck Global Assessment - no abnormal movements, no bruit auscultated on the right, no bruit auscultated on the left, no decreased range of motion, non-tender. Trachea-midline. Thyroid Gland Characteristics - non-tender.  Eye Eyeball - Left-Extraocular movements intact, No Nystagmus. Eyeball - Right-Extraocular movements intact, No Nystagmus. Cornea - Left-No Hazy. Cornea - Right-No Hazy. Sclera/Conjunctiva - Left-No scleral icterus, No Discharge. Sclera/Conjunctiva - Right-No scleral icterus, No Discharge. Pupil - Left-Direct reaction to light normal. Pupil - Right-Direct reaction to light normal.  ENMT Ears Pinna - Left - no drainage observed, no generalized tenderness observed. Right - no drainage observed, no generalized tenderness observed. Nose and Sinuses External Inspection of the Nose - no destructive lesion observed. Inspection of the nares - Left - quiet respiration. Right - quiet respiration. Mouth and Throat Lips - Upper Lip - no fissures observed, no pallor noted. Lower Lip - no fissures observed, no pallor noted. Nasopharynx - no discharge present. Oral Cavity/Oropharynx - Tongue - no dryness observed. Oral Mucosa - no cyanosis observed. Hypopharynx - no evidence of airway distress observed.  Chest and Lung Exam Inspection Movements - Normal and Symmetrical. Accessory muscles - No use of accessory muscles in breathing. Palpation Palpation of the chest reveals - Non-tender. Auscultation Breath sounds - Normal and Clear.  Cardiovascular Auscultation Rhythm - Regular. Murmurs & Other Heart Sounds - Auscultation of the heart reveals - No Murmurs and No Systolic Clicks.  Abdomen Inspection Inspection of the abdomen reveals - No Visible peristalsis and No Abnormal pulsations. Umbilicus - No Bleeding, No Urine drainage. Palpation/Percussion Palpation and Percussion of the abdomen reveal - Soft, Non Tender, No Rebound tenderness, No  Rigidity (guarding) and No Cutaneous hyperesthesia. Note: Soft. Nontender nondistended. No umbilical hernia. No diastases.   Female Genitourinary Sexual Maturity Tanner 5 - Adult hair pattern. Note: No vaginal bleeding nor discharge. No inguinal hernias.   Peripheral Vascular Upper Extremity Inspection - Left - No Cyanotic nailbeds, Not Ischemic. Right - No Cyanotic nailbeds, Not Ischemic.  Neurologic Neurologic evaluation reveals -normal attention span and ability to concentrate, able to name objects and repeat phrases. Appropriate fund of knowledge , normal sensation and normal coordination.  Mental Status Affect - not angry, not paranoid. Cranial Nerves-Normal Bilaterally. Gait-Normal.  Neuropsychiatric Mental status exam performed with findings of-able to articulate well with normal speech/language, rate, volume and coherence, thought content normal with ability to perform basic computations and apply abstract reasoning and no evidence of hallucinations, delusions, obsessions or homicidal/suicidal ideation.  Musculoskeletal Global Assessment Spine, Ribs and Pelvis - no instability, subluxation or laxity. Right Upper Extremity - no instability, subluxation or laxity.  Lymphatic Head & Neck  General Head & Neck Lymphatics: Bilateral - Description - No Localized lymphadenopathy. Axillary  General Axillary Region: Bilateral - Description - No Localized lymphadenopathy. Femoral & Inguinal  Generalized Femoral & Inguinal Lymphatics: Left - Description - No Localized lymphadenopathy. Right - Description - No Localized lymphadenopathy.    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  I think this will require resection or at least bypass. Hopefully  can be antrectomy and Billroth I reconstruction. Probable selective versus truncal vagotomy as well. Risk reasonable minimally invasive approach. We'll see if can do robotically.  I would like to get an upper GI series to get a label and and see how bad her stomach and stricture are.  I did discuss surgery in detail. She seemed surprised that she may need to stay week. I cautioned her that gastric ileus can sometimes be even longer. Hopefully if she is otherwise active her recovery will not be too difficult.  I strongly encouraged her to completely quit smoking. That would minimize perioperative complications. Continue to be very active physically.  She and her husband wish to be aggressive and have this done as soon as possible. We'll work to coordinate and schedule a convenient time.  Current Plans The anatomy & physiology of the foregut and digestive tract was discussed. The gastric pathophysiology was discussed. Natural history risks without surgery was discussed. The patient's situation is not adequately controlled by medicines and other non-operative treatments. I feel the risks of no intervention will lead to serious problems that outweigh the operative risks; therefore, I recommended surgery to resect part of the stomach. Need for a thorough workup to rule out the differential diagnosis and plan treatment was explained. I explained laparoscopic techniques with possible need for an open approach.  Risks such as bleeding, infection, abscess, leak, need for further treatment, heart attack, death, and other risks were discussed. I noted a good likelihood this will help address the problem. Goals of post-operative recovery were discussed as well. Possibility that this will not correct all symptoms was explained. Post-operative dysphagia, need for short-term liquid & pureed diet, inability to vomit, possibility of reherniation, possible need for medicines to help control symptoms in addition to  surgery were discussed. Possible need for a feeding tube was discussed. Possible delayed gastric emptying or dumping syndromes discussed as well since at least selective versus truncal vagotomy will be required. We will work to minimize complications. Educational handouts further explaining the pathology, treatment options, and dysphagia diet was given as well. Questions were answered. The patient expresses understanding & wishes to proceed with surgery. Schedule for Surgery Written instructions provided Pt Education - [facility] Laparosopic Post Op [facility]CI (Alaila Pillard) Pt Education - [facility] (Taesean Reth) Peptic Ulcer A peptic ulcer is a sore in the lining of your esophagus (esophageal ulcer), stomach (gastric ulcer), or in the first  part of your small intestine (duodenal ulcer). The ulcer causes erosion into the deeper tissue. CAUSES Normally, the lining of the stomach and the small intestine protects itself from the acid that digests food. The protective lining can be damaged by: An infection caused by a bacterium called Helicobacter pylori (H. pylori). Regular use of nonsteroidal anti-inflammatory drugs (NSAIDs), such as ibuprofen or aspirin. Smoking tobacco. Other risk factors include being older than 68, drinking alcohol excessively, and having a family history of ulcer disease. SYMPTOMS Burning pain or gnawing in the area between the chest and the belly button. Heartburn. Nausea and vomiting. Bloating. The pain can be worse on an empty stomach and at night. If the ulcer results in bleeding, it can cause: Black, tarry stools. Vomiting of bright red blood. Vomiting of coffee-ground-looking materials. DIAGNOSIS A diagnosis is usually made based upon your history and an exam. Other tests and procedures may be performed to find the cause of the ulcer. Finding a cause will help determine the best treatment. Tests and procedures may include: Blood tests, stool tests, or breath tests to check for the  bacterium H. pylori. An upper gastrointestinal (GI) series of the esophagus, stomach, and small intestine. An endoscopy to examine the esophagus, stomach, and small intestine. A biopsy. TREATMENT Treatment may include: Eliminating the cause of the ulcer, such as smoking, NSAIDs, or alcohol. Medicines to reduce the amount of acid in your digestive tract. Antibiotic medicines if the ulcer is caused by the H. pylori bacterium. An upper endoscopy to treat a bleeding ulcer. Surgery if the bleeding is severe or if the ulcer created a hole somewhere in the digestive system. HOME CARE INSTRUCTIONS Avoid tobacco, alcohol, and caffeine. Smoking can increase the acid in the stomach, and continued smoking will impair the healing of ulcers. Avoid foods and drinks that seem to cause discomfort or aggravate your ulcer. Only take medicines as directed by your caregiver. Do not substitute over-the-counter medicines for prescription medicines without talking to your caregiver. Keep any follow-up appointments and tests as directed. SEEK MEDICAL CARE IF: Your do not improve within 7 days of starting treatment. You have ongoing indigestion or heartburn. SEEK IMMEDIATE MEDICAL CARE IF: You have sudden, sharp, or persistent abdominal pain. You have bloody or dark black, tarry stools. You vomit blood or vomit that looks like coffee grounds. You become light-headed, weak, or feel faint. You become sweaty or clammy. MAKE SURE YOU: Understand these instructions. Will watch your condition. Will get help right away if you are not doing well or get worse. Document Released: 02/23/2000 Document Revised: 07/12/2013 Document Reviewed: 09/25/2011 [facility] Patient Information 2015 [place], [place]. This information is not intended to replace advice given to you by your health care provider. Make sure you discuss any questions you have with your health care provider. Referred to Gastroenterology, for evaluation and follow  up Physiological scientist).  [name], M.D., F.A.C.S. Gastrointestinal and Minimally Invasive Surgery [facility] [address], [place], [place] [address] [phone] Main / Paging

## 2014-05-10 ENCOUNTER — Ambulatory Visit
Admission: RE | Admit: 2014-05-10 | Discharge: 2014-05-10 | Disposition: A | Discharge status: Home / Self Care | Payer: BLUE CROSS/BLUE SHIELD | Source: Ambulatory Visit | Attending: Surgery | Admitting: Surgery

## 2014-05-11 ENCOUNTER — Encounter: Payer: Self-pay | Admitting: Internal Medicine

## 2014-05-18 ENCOUNTER — Other Ambulatory Visit (INDEPENDENT_AMBULATORY_CARE_PROVIDER_SITE_OTHER): Payer: Self-pay | Admitting: Surgery

## 2014-05-18 ENCOUNTER — Telehealth: Payer: Self-pay | Admitting: Gastroenterology

## 2014-05-18 NOTE — Telephone Encounter (Signed)
Patient is not happy with the service at [facility].  They will not return her calls nor give her the results of the imaging scan she had.  She wants to be referred to an alternate surgeon.  Please advise ( she said you all discussed [facility])

## 2014-05-19 NOTE — Telephone Encounter (Signed)
I don't recall recommending [facility]. I can't find a note from [facility] but I think she had an appt. Please ask Dr Hilarie Fredrickson what surgeons he would suggest at [facility]. Please let [facility] know she is leaving their practice.

## 2014-05-20 NOTE — Telephone Encounter (Signed)
I spoke with the patient again and she has decided that maybe she was "jumping the gun" and would give [facility] another chance.  She expressed concern that they have not called her back with the results and are not returning her calls. I advised the patient I would call [facility] and see if I could speak with one of the nurses about her concerns and see if they would call her back today.  I called [facility] they have documented in their system that they left the patient two messages on 2 separate days the last one being yesterday morning around 10:00 to discuss results and Dr. Clyda Greener recommendations.  They have left the messages on her home phone.  I called the patient back and advised her they are trying to reach her and asked that she call them.  She said she would call them today and if she was not satisfied with the plan or their office after the conversation she will call me back and we will proceed with referral to [facility].  She agrees with this plan.

## 2014-06-13 ENCOUNTER — Encounter (HOSPITAL_COMMUNITY): Payer: Self-pay

## 2014-06-14 ENCOUNTER — Other Ambulatory Visit (HOSPITAL_COMMUNITY): Payer: Self-pay | Admitting: *Deleted

## 2014-06-14 NOTE — Patient Instructions (Addendum)
SHENICE DOLDER  06/14/2014   Your procedure is scheduled on: 06-24-14  Report to [facility] Main  Entrance and follow signs to               [place] at 900 AM.  Call this number if you have problems the morning of surgery [phone]   Remember: short stay will draw your blood type morning of your surgery.  Do not eat food or drink liquids :After Midnight.     Take these medicines the morning of surgery with A SIP OF WATER: nexium, prozac, xanax                              You may not have any metal on your body including hair pins and              piercings  Do not wear jewelry, make-up, lotions, powders or perfumes.             Do not wear nail polish.  Do not shave  48 hours prior to surgery.              Men may shave face and neck.   Do not bring valuables to the hospital. [facility].  Contacts, dentures or bridgework may not be worn into surgery.  Leave suitcase in the car. After surgery it may be brought to your room.     Patients discharged the day of surgery will not be allowed to drive home.  Name and phone number of your driver:  Special Instructions: N/A              Please read over the following fact sheets you were given: _____________________________________________________________________             [facility] - Preparing for Surgery Before surgery, you can play an important role.  Because skin is not sterile, your skin needs to be as free of germs as possible.  You can reduce the number of germs on your skin by washing with CHG (chlorahexidine gluconate) soap before surgery.  CHG is an antiseptic cleaner which kills germs and bonds with the skin to continue killing germs even after washing. Please DO NOT use if you have an allergy to CHG or antibacterial soaps.  If your skin becomes reddened/irritated stop using the CHG and inform your nurse when you arrive at Short Stay. Do not  shave (including legs and underarms) for at least 48 hours prior to the first CHG shower.  You may shave your face/neck. Please follow these instructions carefully:  1.  Shower with CHG Soap the night before surgery and the  morning of Surgery.  2.  If you choose to wash your hair, wash your hair first as usual with your  normal  shampoo.  3.  After you shampoo, rinse your hair and body thoroughly to remove the  shampoo.                           4.  Use CHG as you would any other liquid soap.  You can apply chg directly  to the skin and wash  Gently with a scrungie or clean washcloth.  5.  Apply the CHG Soap to your body ONLY FROM THE NECK DOWN.   Do not use on face/ open                           Wound or open sores. Avoid contact with eyes, ears mouth and genitals (private parts).                       Wash face,  Genitals (private parts) with your normal soap.             6.  Wash thoroughly, paying special attention to the area where your surgery  will be performed.  7.  Thoroughly rinse your body with warm water from the neck down.  8.  DO NOT shower/wash with your normal soap after using and rinsing off  the CHG Soap.                9.  Pat yourself dry with a clean towel.            10.  Wear clean pajamas.            11.  Place clean sheets on your bed the night of your first shower and do not  sleep with pets. Day of Surgery : Do not apply any lotions/deodorants the morning of surgery.  Please wear clean clothes to the hospital/surgery center.  FAILURE TO FOLLOW THESE INSTRUCTIONS MAY RESULT IN THE CANCELLATION OF YOUR SURGERY PATIENT SIGNATURE_________________________________  NURSE SIGNATURE__________________________________  ________________________________________________________________________

## 2014-06-15 ENCOUNTER — Encounter (HOSPITAL_COMMUNITY)
Admission: RE | Admit: 2014-06-15 | Discharge: 2014-06-15 | Disposition: A | Discharge status: Home / Self Care | Payer: BLUE CROSS/BLUE SHIELD | Source: Ambulatory Visit | Attending: Surgery | Admitting: Surgery

## 2014-06-15 ENCOUNTER — Encounter (HOSPITAL_COMMUNITY): Payer: Self-pay

## 2014-06-15 DIAGNOSIS — Z01812 Encounter for preprocedural laboratory examination: Secondary | ICD-10-CM | POA: Insufficient documentation

## 2014-06-15 LAB — BASIC METABOLIC PANEL
ANION GAP: 10 (ref 5–15)
BUN: 11 mg/dL (ref 6–23)
CO2: 27 mmol/L (ref 19–32)
Calcium: 9.3 mg/dL (ref 8.4–10.5)
Chloride: 104 mmol/L (ref 96–112)
Creatinine, Ser: 0.74 mg/dL (ref 0.50–1.10)
GFR calc non Af Amer: >90 mL/min (ref >90)
GLUCOSE: 95 mg/dL (ref 70–99)
POTASSIUM: 4.5 mmol/L (ref 3.5–5.1)
Sodium: 141 mmol/L (ref 135–145)

## 2014-06-15 LAB — CBC
HCT: 40.4 % (ref 36.0–46.0)
Hemoglobin: 13.7 g/dL (ref 12.0–15.0)
MCH: 30.2 pg (ref 26.0–34.0)
MCHC: 33.9 g/dL (ref 30.0–36.0)
MCV: 89 fL (ref 78.0–100.0)
PLATELETS: 278 10*3/uL (ref 150–400)
RBC: 4.54 MIL/uL (ref 3.87–5.11)
RDW: 12.9 % (ref 11.5–15.5)
WBC: 7.7 10*3/uL (ref 4.0–10.5)

## 2014-06-24 ENCOUNTER — Inpatient Hospital Stay (HOSPITAL_COMMUNITY): Payer: BLUE CROSS/BLUE SHIELD | Admitting: Anesthesiology

## 2014-06-24 ENCOUNTER — Encounter (HOSPITAL_COMMUNITY): Payer: Self-pay | Admitting: *Deleted

## 2014-06-24 ENCOUNTER — Encounter (HOSPITAL_COMMUNITY)
Admission: RE | Disposition: A | Discharge status: Home / Self Care | Payer: Self-pay | Source: Ambulatory Visit | Attending: Surgery

## 2014-06-24 ENCOUNTER — Inpatient Hospital Stay (HOSPITAL_COMMUNITY)
Admission: RE | Admit: 2014-06-24 | Discharge: 2014-06-28 | DRG: 328 | Disposition: A | Discharge status: Home / Self Care | Payer: BLUE CROSS/BLUE SHIELD | Source: Ambulatory Visit | Attending: Surgery | Admitting: Surgery

## 2014-06-24 DIAGNOSIS — F17210 Nicotine dependence, cigarettes, uncomplicated: Secondary | ICD-10-CM | POA: Diagnosis present

## 2014-06-24 DIAGNOSIS — F329 Major depressive disorder, single episode, unspecified: Secondary | ICD-10-CM | POA: Diagnosis present

## 2014-06-24 DIAGNOSIS — E780 Pure hypercholesterolemia: Secondary | ICD-10-CM | POA: Diagnosis present

## 2014-06-24 DIAGNOSIS — K219 Gastro-esophageal reflux disease without esophagitis: Secondary | ICD-10-CM | POA: Diagnosis present

## 2014-06-24 DIAGNOSIS — D696 Thrombocytopenia, unspecified: Secondary | ICD-10-CM | POA: Diagnosis not present

## 2014-06-24 DIAGNOSIS — Z79899 Other long term (current) drug therapy: Secondary | ICD-10-CM | POA: Diagnosis not present

## 2014-06-24 DIAGNOSIS — K449 Diaphragmatic hernia without obstruction or gangrene: Secondary | ICD-10-CM | POA: Diagnosis present

## 2014-06-24 DIAGNOSIS — F410 Panic disorder [episodic paroxysmal anxiety] without agoraphobia: Secondary | ICD-10-CM | POA: Diagnosis present

## 2014-06-24 DIAGNOSIS — K311 Adult hypertrophic pyloric stenosis: Principal | ICD-10-CM | POA: Diagnosis present

## 2014-06-24 DIAGNOSIS — K315 Obstruction of duodenum: Secondary | ICD-10-CM | POA: Diagnosis present

## 2014-06-24 DIAGNOSIS — E669 Obesity, unspecified: Secondary | ICD-10-CM | POA: Diagnosis present

## 2014-06-24 DIAGNOSIS — Z6833 Body mass index (BMI) 33.0-33.9, adult: Secondary | ICD-10-CM | POA: Diagnosis not present

## 2014-06-24 DIAGNOSIS — F419 Anxiety disorder, unspecified: Secondary | ICD-10-CM | POA: Diagnosis present

## 2014-06-24 DIAGNOSIS — Z903 Acquired absence of stomach [part of]: Secondary | ICD-10-CM

## 2014-06-24 HISTORY — PX: ANTRECTOMY: SHX5722

## 2014-06-24 HISTORY — PX: VAGOTOMY: SHX5282

## 2014-06-24 HISTORY — PX: LAPAROSCOPIC ROUX-EN-Y GASTRIC BYPASS WITH HIATAL HERNIA REPAIR: SHX6513

## 2014-06-24 LAB — TYPE AND SCREEN
ABO/RH(D): A POS
Antibody Screen: NEGATIVE

## 2014-06-24 LAB — HEMOGLOBIN AND HEMATOCRIT, BLOOD
HCT: 38.7 % (ref 36.0–46.0)
Hemoglobin: 12.9 g/dL (ref 12.0–15.0)

## 2014-06-24 LAB — ABO/RH: ABO/RH(D): A POS

## 2014-06-24 SURGERY — VAGOTOMY
Anesthesia: General | Site: Abdomen

## 2014-06-24 MED ORDER — BUPIVACAINE-EPINEPHRINE 0.25% -1:200000 IJ SOLN
INTRAMUSCULAR | Status: AC
  Filled 2014-06-24: qty 1

## 2014-06-24 MED ORDER — LACTATED RINGERS IV SOLN
INTRAVENOUS | Status: DC | PRN
  Administered 2014-06-24 (×4): via INTRAVENOUS

## 2014-06-24 MED ORDER — EPHEDRINE SULFATE 50 MG/ML IJ SOLN
INTRAMUSCULAR | Status: DC | PRN
  Administered 2014-06-24: 5 mg via INTRAVENOUS

## 2014-06-24 MED ORDER — METOCLOPRAMIDE HCL 5 MG/ML IJ SOLN: 5.0000 mg | Freq: Four times a day (QID) | INTRAMUSCULAR | Status: DC | PRN

## 2014-06-24 MED ORDER — PHENYLEPHRINE HCL 10 MG/ML IJ SOLN
INTRAMUSCULAR | Status: DC | PRN
  Administered 2014-06-24 (×2): 40 ug via INTRAVENOUS
  Administered 2014-06-24: 80 ug via INTRAVENOUS

## 2014-06-24 MED ORDER — HYDROMORPHONE HCL 1 MG/ML IJ SOLN
0.5000 mg | INTRAMUSCULAR | Status: DC | PRN
  Administered 2014-06-24: 0.5 mg via INTRAVENOUS
  Administered 2014-06-25: 1 mg via INTRAVENOUS
  Administered 2014-06-25: 0.5 mg via INTRAVENOUS
  Filled 2014-06-24 (×3): qty 1

## 2014-06-24 MED ORDER — BISACODYL 10 MG RE SUPP: 10.0000 mg | Freq: Two times a day (BID) | RECTAL | Status: DC | PRN

## 2014-06-24 MED ORDER — OXYCODONE HCL 5 MG PO TABS: 5.0000 mg | ORAL_TABLET | ORAL | Status: DC | PRN

## 2014-06-24 MED ORDER — ONDANSETRON HCL 4 MG/2ML IJ SOLN
INTRAMUSCULAR | Status: DC | PRN
  Administered 2014-06-24: 4 mg via INTRAVENOUS

## 2014-06-24 MED ORDER — UNJURY CHOCOLATE CLASSIC POWDER: 2.0000 [oz_av] | Freq: Four times a day (QID) | ORAL | Status: DC

## 2014-06-24 MED ORDER — ALUM & MAG HYDROXIDE-SIMETH 200-200-20 MG/5ML PO SUSP: 30.0000 mL | Freq: Four times a day (QID) | ORAL | Status: DC | PRN

## 2014-06-24 MED ORDER — DEXTROSE 5 % IV SOLN
2.0000 g | Freq: Two times a day (BID) | INTRAVENOUS | Status: AC
  Administered 2014-06-24: 2 g via INTRAVENOUS
  Filled 2014-06-24: qty 2

## 2014-06-24 MED ORDER — DEXAMETHASONE SODIUM PHOSPHATE 10 MG/ML IJ SOLN
INTRAMUSCULAR | Status: AC
  Filled 2014-06-24: qty 1

## 2014-06-24 MED ORDER — BUPIVACAINE-EPINEPHRINE 0.25% -1:200000 IJ SOLN
INTRAMUSCULAR | Status: DC | PRN
  Administered 2014-06-24: 75 mL

## 2014-06-24 MED ORDER — BUPIVACAINE-EPINEPHRINE (PF) 0.25% -1:200000 IJ SOLN
INTRAMUSCULAR | Status: AC
  Filled 2014-06-24: qty 30

## 2014-06-24 MED ORDER — METHOCARBAMOL 1000 MG/10ML IJ SOLN
1000.0000 mg | Freq: Four times a day (QID) | INTRAVENOUS | Status: DC | PRN
  Filled 2014-06-24: qty 10

## 2014-06-24 MED ORDER — MENTHOL 3 MG MT LOZG: 1.0000 | LOZENGE | OROMUCOSAL | Status: DC | PRN

## 2014-06-24 MED ORDER — PROPOFOL 10 MG/ML IV BOLUS
INTRAVENOUS | Status: DC | PRN
  Administered 2014-06-24: 190 mg via INTRAVENOUS

## 2014-06-24 MED ORDER — ACETAMINOPHEN 160 MG/5ML PO SOLN: 650.0000 mg | ORAL | Status: DC | PRN

## 2014-06-24 MED ORDER — ROCURONIUM BROMIDE 100 MG/10ML IV SOLN
INTRAVENOUS | Status: AC
  Filled 2014-06-24: qty 1

## 2014-06-24 MED ORDER — CEFOTETAN DISODIUM-DEXTROSE 2-2.08 GM-% IV SOLR
2.0000 g | INTRAVENOUS | Status: AC
  Administered 2014-06-24: 2 g via INTRAVENOUS

## 2014-06-24 MED ORDER — ACETAMINOPHEN 160 MG/5ML PO SOLN: 325.0000 mg | ORAL | Status: DC | PRN

## 2014-06-24 MED ORDER — LIP MEDEX EX OINT
1.0000 "application " | TOPICAL_OINTMENT | Freq: Two times a day (BID) | CUTANEOUS | Status: DC
  Administered 2014-06-24 – 2014-06-28 (×7): 1 via TOPICAL
  Filled 2014-06-24 (×2): qty 7

## 2014-06-24 MED ORDER — HEPARIN SODIUM (PORCINE) 5000 UNIT/ML IJ SOLN
5000.0000 [IU] | Freq: Three times a day (TID) | INTRAMUSCULAR | Status: DC
  Administered 2014-06-25 – 2014-06-26 (×4): 5000 [IU] via SUBCUTANEOUS
  Filled 2014-06-24 (×7): qty 1

## 2014-06-24 MED ORDER — DIPHENHYDRAMINE HCL 50 MG/ML IJ SOLN: 12.5000 mg | Freq: Four times a day (QID) | INTRAMUSCULAR | Status: DC | PRN

## 2014-06-24 MED ORDER — PANTOPRAZOLE SODIUM 40 MG IV SOLR
40.0000 mg | Freq: Two times a day (BID) | INTRAVENOUS | Status: DC
  Administered 2014-06-24 – 2014-06-26 (×5): 40 mg via INTRAVENOUS
  Filled 2014-06-24 (×7): qty 40

## 2014-06-24 MED ORDER — PHENOL 1.4 % MT LIQD: 2.0000 | OROMUCOSAL | Status: DC | PRN

## 2014-06-24 MED ORDER — OXYCODONE HCL 5 MG/5ML PO SOLN: 5.0000 mg | ORAL | Status: DC | PRN

## 2014-06-24 MED ORDER — DEXAMETHASONE SODIUM PHOSPHATE 10 MG/ML IJ SOLN
INTRAMUSCULAR | Status: DC | PRN
  Administered 2014-06-24: 10 mg via INTRAVENOUS

## 2014-06-24 MED ORDER — FENTANYL CITRATE (PF) 250 MCG/5ML IJ SOLN
INTRAMUSCULAR | Status: AC
  Filled 2014-06-24: qty 5

## 2014-06-24 MED ORDER — LACTATED RINGERS IR SOLN
Status: DC | PRN
  Administered 2014-06-24: 1000 mL

## 2014-06-24 MED ORDER — NEOSTIGMINE METHYLSULFATE 10 MG/10ML IV SOLN
INTRAVENOUS | Status: DC | PRN
  Administered 2014-06-24: 3 mg via INTRAVENOUS

## 2014-06-24 MED ORDER — MIDAZOLAM HCL 2 MG/2ML IJ SOLN
INTRAMUSCULAR | Status: AC
  Filled 2014-06-24: qty 2

## 2014-06-24 MED ORDER — LIDOCAINE HCL (CARDIAC) 20 MG/ML IV SOLN
INTRAVENOUS | Status: DC | PRN
  Administered 2014-06-24: 80 mg via INTRAVENOUS

## 2014-06-24 MED ORDER — ROCURONIUM BROMIDE 100 MG/10ML IV SOLN
INTRAVENOUS | Status: DC | PRN
  Administered 2014-06-24: 10 mg via INTRAVENOUS
  Administered 2014-06-24: 5 mg via INTRAVENOUS
  Administered 2014-06-24: 10 mg via INTRAVENOUS
  Administered 2014-06-24 (×2): 20 mg via INTRAVENOUS
  Administered 2014-06-24: 30 mg via INTRAVENOUS
  Administered 2014-06-24: 10 mg via INTRAVENOUS

## 2014-06-24 MED ORDER — PROPOFOL 10 MG/ML IV BOLUS
INTRAVENOUS | Status: AC
  Filled 2014-06-24: qty 20

## 2014-06-24 MED ORDER — MIDAZOLAM HCL 5 MG/5ML IJ SOLN
INTRAMUSCULAR | Status: DC | PRN
  Administered 2014-06-24: 2 mg via INTRAVENOUS

## 2014-06-24 MED ORDER — ONDANSETRON HCL 4 MG/2ML IJ SOLN
4.0000 mg | INTRAMUSCULAR | Status: DC | PRN
  Administered 2014-06-25: 4 mg via INTRAVENOUS
  Filled 2014-06-24 (×2): qty 2

## 2014-06-24 MED ORDER — FLUOXETINE HCL 20 MG PO CAPS
40.0000 mg | ORAL_CAPSULE | Freq: Every day | ORAL | Status: DC
  Filled 2014-06-24: qty 2

## 2014-06-24 MED ORDER — METOPROLOL TARTRATE 1 MG/ML IV SOLN
5.0000 mg | Freq: Four times a day (QID) | INTRAVENOUS | Status: DC | PRN
  Filled 2014-06-24: qty 5

## 2014-06-24 MED ORDER — GLYCOPYRROLATE 0.2 MG/ML IJ SOLN
INTRAMUSCULAR | Status: DC | PRN
  Administered 2014-06-24: .4 mg via INTRAVENOUS

## 2014-06-24 MED ORDER — HYDROMORPHONE HCL 1 MG/ML IJ SOLN
INTRAMUSCULAR | Status: DC | PRN
  Administered 2014-06-24: 0.5 mg via INTRAVENOUS
  Administered 2014-06-24: 1 mg via INTRAVENOUS
  Administered 2014-06-24: 0.5 mg via INTRAVENOUS
  Administered 2014-06-24: 1 mg via INTRAVENOUS

## 2014-06-24 MED ORDER — PROMETHAZINE HCL 25 MG/ML IJ SOLN: 6.2500 mg | INTRAMUSCULAR | Status: DC | PRN

## 2014-06-24 MED ORDER — UNJURY CHICKEN SOUP POWDER: 2.0000 [oz_av] | Freq: Four times a day (QID) | ORAL | Status: DC

## 2014-06-24 MED ORDER — 0.9 % SODIUM CHLORIDE (POUR BTL) OPTIME
TOPICAL | Status: DC | PRN
  Administered 2014-06-24: 2000 mL

## 2014-06-24 MED ORDER — ALPRAZOLAM 0.25 MG PO TABS: 0.2500 mg | ORAL_TABLET | Freq: Two times a day (BID) | ORAL | Status: DC | PRN

## 2014-06-24 MED ORDER — HYDROMORPHONE HCL 2 MG/ML IJ SOLN
INTRAMUSCULAR | Status: AC
  Filled 2014-06-24: qty 1

## 2014-06-24 MED ORDER — LIDOCAINE HCL (CARDIAC) 20 MG/ML IV SOLN
INTRAVENOUS | Status: AC
Start: 2014-06-24 — End: 2014-06-24
  Filled 2014-06-24: qty 5

## 2014-06-24 MED ORDER — FENTANYL CITRATE (PF) 100 MCG/2ML IJ SOLN
INTRAMUSCULAR | Status: DC | PRN
  Administered 2014-06-24: 100 ug via INTRAVENOUS
  Administered 2014-06-24 (×7): 50 ug via INTRAVENOUS

## 2014-06-24 MED ORDER — LACTATED RINGERS IV BOLUS (SEPSIS): 1000.0000 mL | Freq: Three times a day (TID) | INTRAVENOUS | Status: AC | PRN

## 2014-06-24 MED ORDER — LACTATED RINGERS IV SOLN
INTRAVENOUS | Status: DC
  Administered 2014-06-24: 1000 mL via INTRAVENOUS

## 2014-06-24 MED ORDER — SODIUM CHLORIDE 0.9 % IV SOLN
INTRAVENOUS | Status: DC
  Administered 2014-06-24 – 2014-06-25 (×2): via INTRAVENOUS
  Administered 2014-06-25: 75 mL/h via INTRAVENOUS

## 2014-06-24 MED ORDER — ACETAMINOPHEN 650 MG RE SUPP: 650.0000 mg | Freq: Four times a day (QID) | RECTAL | Status: DC | PRN

## 2014-06-24 MED ORDER — SODIUM CHLORIDE 0.9 % IJ SOLN
INTRAMUSCULAR | Status: AC
  Filled 2014-06-24: qty 10

## 2014-06-24 MED ORDER — ACETAMINOPHEN 10 MG/ML IV SOLN
1000.0000 mg | Freq: Once | INTRAVENOUS | Status: AC
  Administered 2014-06-24: 1000 mg via INTRAVENOUS
  Filled 2014-06-24: qty 100

## 2014-06-24 MED ORDER — CETYLPYRIDINIUM CHLORIDE 0.05 % MT LIQD
7.0000 mL | Freq: Two times a day (BID) | OROMUCOSAL | Status: DC
  Administered 2014-06-24 – 2014-06-27 (×7): 7 mL via OROMUCOSAL

## 2014-06-24 MED ORDER — CEFOTETAN DISODIUM-DEXTROSE 2-2.08 GM-% IV SOLR
INTRAVENOUS | Status: AC
  Filled 2014-06-24: qty 50

## 2014-06-24 MED ORDER — PNEUMOCOCCAL VAC POLYVALENT 25 MCG/0.5ML IJ INJ
0.5000 mL | INJECTION | INTRAMUSCULAR | Status: DC
  Filled 2014-06-24 (×2): qty 0.5

## 2014-06-24 MED ORDER — MAGIC MOUTHWASH
15.0000 mL | Freq: Four times a day (QID) | ORAL | Status: DC | PRN
  Filled 2014-06-24: qty 15

## 2014-06-24 MED ORDER — LORAZEPAM 2 MG/ML IJ SOLN: 0.5000 mg | Freq: Three times a day (TID) | INTRAMUSCULAR | Status: DC | PRN

## 2014-06-24 MED ORDER — ONDANSETRON HCL 4 MG/2ML IJ SOLN
INTRAMUSCULAR | Status: AC
  Filled 2014-06-24: qty 2

## 2014-06-24 MED ORDER — SUCCINYLCHOLINE CHLORIDE 20 MG/ML IJ SOLN
INTRAMUSCULAR | Status: DC | PRN
  Administered 2014-06-24: 90 mg via INTRAVENOUS

## 2014-06-24 MED ORDER — UNJURY VANILLA POWDER: 2.0000 [oz_av] | Freq: Four times a day (QID) | ORAL | Status: DC

## 2014-06-24 SURGICAL SUPPLY — 81 items
APPLIER CLIP 5 13 M/L LIGAMAX5 (MISCELLANEOUS)
APPLIER CLIP ROT 10 11.4 M/L (STAPLE)
BENZOIN TINCTURE PRP APPL 2/3 (GAUZE/BANDAGES/DRESSINGS) IMPLANT
BLADE SURG SZ11 CARB STEEL (BLADE) ×2 IMPLANT
CHLORAPREP W/TINT 26ML (MISCELLANEOUS) ×2 IMPLANT
CLIP APPLIE 5 13 M/L LIGAMAX5 (MISCELLANEOUS) IMPLANT
CLIP APPLIE ROT 10 11.4 M/L (STAPLE) IMPLANT
CLIP LIGATING HEM O LOK PURPLE (MISCELLANEOUS) ×8 IMPLANT
CLIP LIGATING HEMO O LOK GREEN (MISCELLANEOUS) IMPLANT
COVER TIP SHEARS 8 DVNC (MISCELLANEOUS) ×1 IMPLANT
COVER TIP SHEARS 8MM DA VINCI (MISCELLANEOUS) ×1
DECANTER SPIKE VIAL GLASS SM (MISCELLANEOUS) ×2 IMPLANT
DEVICE TROCAR PUNCTURE CLOSURE (ENDOMECHANICALS) IMPLANT
DISSECTOR BLUNT TIP ENDO 5MM (MISCELLANEOUS) IMPLANT
DRAIN PENROSE 18X1/2 LTX STRL (DRAIN) ×2 IMPLANT
DRAPE ARM DVNC X/XI (DISPOSABLE) ×4 IMPLANT
DRAPE COLUMN DVNC XI (DISPOSABLE) ×1 IMPLANT
DRAPE DA VINCI XI ARM (DISPOSABLE) ×4
DRAPE DA VINCI XI COLUMN (DISPOSABLE) ×1
ELECT REM PT RETURN 9FT ADLT (ELECTROSURGICAL) ×2
ELECTRODE REM PT RTRN 9FT ADLT (ELECTROSURGICAL) ×1 IMPLANT
ENDOLOOP SUT PDS II  0 18 (SUTURE)
ENDOLOOP SUT PDS II 0 18 (SUTURE) IMPLANT
GAUZE SPONGE 2X2 8PLY STRL LF (GAUZE/BANDAGES/DRESSINGS) ×1 IMPLANT
GAUZE SPONGE 4X4 16PLY XRAY LF (GAUZE/BANDAGES/DRESSINGS) ×2 IMPLANT
GLOVE BIO SURGEON STRL SZ 6.5 (GLOVE) ×8 IMPLANT
GLOVE BIO SURGEON STRL SZ7.5 (GLOVE) IMPLANT
GLOVE BIOGEL PI IND STRL 6.5 (GLOVE) ×2 IMPLANT
GLOVE BIOGEL PI IND STRL 7.0 (GLOVE) ×4 IMPLANT
GLOVE BIOGEL PI IND STRL 8 (GLOVE) ×3 IMPLANT
GLOVE BIOGEL PI INDICATOR 6.5 (GLOVE) ×2
GLOVE BIOGEL PI INDICATOR 7.0 (GLOVE) ×4
GLOVE BIOGEL PI INDICATOR 8 (GLOVE) ×3
GLOVE ECLIPSE 8.0 STRL XLNG CF (GLOVE) ×4 IMPLANT
GLOVE SURG SS PI 6.5 STRL IVOR (GLOVE) ×10 IMPLANT
GOWN STRL REUS W/TWL XL LVL3 (GOWN DISPOSABLE) ×16 IMPLANT
KIT BASIN OR (CUSTOM PROCEDURE TRAY) ×2 IMPLANT
LIQUID BAND (GAUZE/BANDAGES/DRESSINGS) IMPLANT
NEEDLE HYPO 22GX1.5 SAFETY (NEEDLE) ×2 IMPLANT
NEEDLE INSUFFLATION 14GA 120MM (NEEDLE) IMPLANT
PACK CARDIOVASCULAR III (CUSTOM PROCEDURE TRAY) ×2 IMPLANT
PAD POSITIONING PINK XL (MISCELLANEOUS) ×2 IMPLANT
PEN SKIN MARKING BROAD (MISCELLANEOUS) ×2 IMPLANT
POUCH SPECIMEN RETRIEVAL 10MM (ENDOMECHANICALS) ×2 IMPLANT
RELOAD STAPLE HERNIA 4.0 BLUE (INSTRUMENTS) IMPLANT
RELOAD STAPLE HERNIA 4.8 BLK (STAPLE) IMPLANT
SCISSORS LAP 5X35 DISP (ENDOMECHANICALS) ×2 IMPLANT
SEAL CANN UNIV 5-8 DVNC XI (MISCELLANEOUS) ×4 IMPLANT
SEAL XI 5MM-8MM UNIVERSAL (MISCELLANEOUS) ×4
SEALER VESSEL DA VINCI XI (MISCELLANEOUS) ×1
SEALER VESSEL EXT DVNC XI (MISCELLANEOUS) ×1 IMPLANT
SET IRRIG TUBING LAPAROSCOPIC (IRRIGATION / IRRIGATOR) ×2 IMPLANT
SHEARS HARMONIC ACE PLUS 36CM (ENDOMECHANICALS) IMPLANT
SLEEVE XCEL OPT CAN 5 100 (ENDOMECHANICALS) IMPLANT
SOLUTION ANTI FOG 6CC (MISCELLANEOUS) IMPLANT
SOLUTION ELECTROLUBE (MISCELLANEOUS) ×2 IMPLANT
SPONGE GAUZE 2X2 STER 10/PKG (GAUZE/BANDAGES/DRESSINGS) ×1
STAPLER 45 BLU RELOAD XI (STAPLE) ×4 IMPLANT
STAPLER 45 BLUE RELOAD XI (STAPLE) ×4
STAPLER 45 GREEN RELOAD XI (STAPLE) ×3
STAPLER 45 GRN RELOAD XI (STAPLE) ×3 IMPLANT
STAPLER CANNULA SEAL (CANNULA) ×2 IMPLANT
STAPLER HERNIA 12 8.5 360D (INSTRUMENTS) IMPLANT
STAPLER VISISTAT 35W (STAPLE) IMPLANT
STRIP CLOSURE SKIN 1/2X4 (GAUZE/BANDAGES/DRESSINGS) IMPLANT
SUT ETHIBOND 2 0 SH (SUTURE)
SUT ETHIBOND 2 0 SH 36X2 (SUTURE) IMPLANT
SUT ETHIBOND NAB CT1 #1 30IN (SUTURE) ×8 IMPLANT
SUT MNCRL AB 4-0 PS2 18 (SUTURE) ×2 IMPLANT
SUT PDS AB 1 CTX 36 (SUTURE) ×4 IMPLANT
SUT SILK 2 0 SH (SUTURE) ×10 IMPLANT
SUT SILK 3 0 SH 30 (SUTURE) IMPLANT
SUT V-LOC BARB 180 2/0GR6 GS22 (SUTURE) ×10
SUTURE V-LC BRB 180 2/0GR6GS22 (SUTURE) ×5 IMPLANT
SYR 20CC LL (SYRINGE) ×2 IMPLANT
SYRINGE 10CC LL (SYRINGE) IMPLANT
TOWEL OR 17X26 10 PK STRL BLUE (TOWEL DISPOSABLE) ×4 IMPLANT
TOWEL OR NON WOVEN STRL DISP B (DISPOSABLE) ×2 IMPLANT
TRAY FOLEY W/METER SILVER 14FR (SET/KITS/TRAYS/PACK) ×2 IMPLANT
TROCAR BLADELESS OPT 5 100 (ENDOMECHANICALS) ×2 IMPLANT
TUBING FILTER THERMOFLATOR (ELECTROSURGICAL) ×2 IMPLANT

## 2014-06-24 NOTE — Transfer of Care (Signed)
Immediate Anesthesia Transfer of Care Note  Patient: Shelly Hood  Procedure(s) Performed: Procedure(s) with comments: ROBOTIC TRUNCAL VAGOTOMY  (N/A) - This is [name]'s preference ROBOTIC DISTAL GASTRECTOMY (N/A) ROBOTIC ROUX-EN-Y CONSTRUCTION WITH HIATAL HERNIA REPAIR (N/A)  Patient Location: PACU  Anesthesia Type:General  Level of Consciousness: awake, alert , oriented and patient cooperative  Airway & Oxygen Therapy: Patient Spontanous Breathing and Patient connected to face mask oxygen  Post-op Assessment: Report given to RN, Post -op Vital signs reviewed and stable and Patient moving all extremities X 4  Post vital signs: stable  Last Vitals:  Filed Vitals:   06/24/14 1805  BP:   Pulse: 124  Temp:   Resp: 13    Complications: No apparent anesthesia complications

## 2014-06-24 NOTE — Discharge Instructions (Signed)
EATING AFTER YOUR ESOPHAGEAL / GASTRIC SURGERY (Stomach Fundoplication, Hiatal Hernia repair, Achalasia surgery, Partial Gastrectomy, etc)  After your esophageal surgery, expect some sticking with swallowing over the next 1-2 months.    If food sticks when you eat, it is called "dysphagia".  This is due to swelling around your esophagus at the wrap & hiatal diaphragm repair.  It will gradually ease off over the next few months.  To help you through this temporary phase, we start you out on a pureed (blenderized) diet.  Your first meal in the hospital was thin liquids.  You should have been given a pureed diet by the time you left the hospital.  We ask patients to stay on a pureed diet for the first 2-3 weeks to avoid anything getting "stuck" near your recent surgery.  Don't be alarmed if your ability to swallow doesn't progress according to this plan.  Everyone is different and some diets can advance more or less quickly.     Some BASIC RULES to follow are:  Maintain an upright position whenever eating or drinking.  Take small bites - just a teaspoon size bite at a time.  Eat slowly.  It may also help to eat only one food at a time.  Consider nibbling through smaller, more frequent meals & avoid the urge to eat BIG meals  Do not push through feelings of fullness, nausea, or bloatedness  Do not mix solid foods and liquids in the same mouthful  Try not to "wash foods down" with large gulps of liquids. Avoid carbonated (bubbly/fizzy) drinks.  Understand that it will be hard to burp and belch at first.  This gradually improves with time.  Expect to be more gassy/flatulent/bloated initially.  Walking will help you work through that.  Maalox/Gas-X can help as well.  Eat in a relaxed atmosphere & minimize distractions.  Avoid talking while eating.    Do not use straws.  Following each meal, sit in an upright position (90 degree angle) for 60 to 90 minutes.  Going for a short walk can help  as well  If food does stick, don't panic.  Try to relax and let the food pass on its own.  Sipping WARM LIQUID such as strong hot black tea can also help slide it down.   Be gradual in changes & use common sense:  -If you easily tolerating a certain "level" of foods, advance to the next level gradually -If you are having trouble swallowing a particular food, then avoid it.   -If food is sticking when you advance your diet, go back to thinner previous diet (the lower LEVEL) for 1-2 days.  LEVEL 1 = PUREED DIET  Do for the first 2 WEEKS AFTER SURGERY  -Foods in this group are pureed or blenderized to a smooth, mashed potato-like consistency.  -If necessary, the pureed foods can keep their shape with the addition of a thickening agent.   -Meat should be pureed to a smooth, pasty consistency.  Hot broth or gravy may be added to the pureed meat, approximately 1 oz. of liquid per 3 oz. serving of meat. -CAUTION:  If any foods do not puree into a smooth consistency, swallowing will be more difficult.  (For example, nuts or seeds sometimes do not blend well.)  Hot Foods Cold Foods  Pureed scrambled eggs and cheese Pureed cottage cheese  Baby cereals Thickened juices and nectars  Thinned cooked cereals (no lumps) Thickened milk or eggnog  Pureed [place] toast or  pancakes Ensure  Mashed potatoes Ice cream  Pureed parsley, au gratin, scalloped potatoes, candied sweet potatoes Fruit or [place] ice, sherbet  Pureed buttered or alfredo noodles Plain yogurt  Pureed vegetables (no corn or peas) Instant breakfast  Pureed soups and creamed soups Smooth pudding, mousse, custard  Pureed scalloped apples Whipped gelatin  Gravies Sugar, syrup, honey, jelly  Sauces, cheese, tomato, barbecue, white, creamed Cream  Any baby food Creamer  Alcohol in moderation (not beer or champagne) Margarine  Coffee or tea Mayonnaise   Ketchup, mustard   Apple sauce   SAMPLE MENU:  PUREED DIET Breakfast Lunch Dinner     Orange juice, 1/2 cup  Cream of wheat, 1/2 cup  Pineapple juice, 1/2 cup  Pureed [place], barley soup, 3/4 cup  Pureed Hawaiian chicken, 3 oz   Scrambled eggs, mashed or blended with cheese, 1/2 cup  Tea or coffee, 1 cup   Whole milk, 1 cup   Non-dairy creamer, 2 Tbsp.  Mashed potatoes, 1/2 cup  Pureed cooled broccoli, 1/2 cup  Apple sauce, 1/2 cup  Coffee or tea  Mashed potatoes, 1/2 cup  Pureed spinach, 1/2 cup  Frozen yogurt, 1/2 cup  Tea or coffee      LEVEL 2 = SOFT DIET  After your first 2 weeks, you can advance to a soft diet.   Keep on this diet until everything goes down easily.  Hot Foods Cold Foods  White fish Cottage cheese  Stuffed fish Junior baby fruit  Baby food meals Semi thickened juices  Minced soft cooked, scrambled, poached eggs nectars  Souffle & omelets Ripe mashed bananas  Cooked cereals Canned fruit, pineapple sauce, milk  potatoes Milkshake  Buttered or Alfredo noodles Custard  Cooked cooled vegetable Puddings, including tapioca  Sherbet Yogurt  Vegetable soup or alphabet soup Fruit ice, [place] ice  Gravies Whipped gelatin  Sugar, syrup, honey, jelly Junior baby desserts  Sauces:  Cheese, creamed, barbecue, tomato, white Cream  Coffee or tea Margarine   SAMPLE MENU:  LEVEL 2 Breakfast Lunch Dinner   Orange juice, 1/2 cup  Oatmeal, 1/2 cup  Scrambled eggs with cheese, 1/2 cup  Decaffeinated tea, 1 cup  Whole milk, 1 cup  Non-dairy creamer, 2 Tbsp  Pineapple juice, 1/2 cup  Minced beef, 3 oz  Gravy, 2 Tbsp  Mashed potatoes, 1/2 cup  Minced fresh broccoli, 1/2 cup  Applesauce, 1/2 cup  Coffee, 1 cup  [place], barley soup, 3/4 cup  Minced Hawaiian chicken, 3 oz  Mashed potatoes, 1/2 cup  Cooked spinach, 1/2 cup  Frozen yogurt, 1/2 cup  Non-dairy creamer, 2 Tbsp      LEVEL 3 = CHOPPED DIET  -After all the foods in level 2 (soft diet) are passing through well you should advance up to more chopped  foods.  -It is still important to cut these foods into small pieces and eat slowly.  Hot Foods Cold Foods  Poultry Cottage cheese  Chopped Swedish meatballs Yogurt  Meat salads (ground or flaked meat) Milk  Flaked fish (tuna) Milkshakes  Poached or scrambled eggs Soft, cold, dry cereal  Souffles and omelets Fruit juices or nectars  Cooked cereals Chopped canned fruit  Chopped [place] toast or pancakes Canned fruit cocktail  Noodles or pasta (no rice) Pudding, mousse, custard  Cooked vegetables (no frozen peas, corn, or mixed vegetables) Green salad  Canned small sweet peas Ice cream  Creamed soup or vegetable soup Fruit ice, [place] ice  Pureed vegetable soup or alphabet soup  Non-dairy creamer  Ground scalloped apples Margarine  Gravies Mayonnaise  Sauces:  Cheese, creamed, barbecue, tomato, white Ketchup  Coffee or tea Mustard   SAMPLE MENU:  LEVEL 3 Breakfast Lunch Dinner   Orange juice, 1/2 cup  Oatmeal, 1/2 cup  Scrambled eggs with cheese, 1/2 cup  Decaffeinated tea, 1 cup  Whole milk, 1 cup  Non-dairy creamer, 2 Tbsp  Ketchup, 1 Tbsp  Margarine, 1 tsp  Salt, 1/4 tsp  Sugar, 2 tsp  Pineapple juice, 1/2 cup  Ground beef, 3 oz  Gravy, 2 Tbsp  Mashed potatoes, 1/2 cup  Cooked spinach, 1/2 cup  Applesauce, 1/2 cup  Decaffeinated coffee  Whole milk  Non-dairy creamer, 2 Tbsp  Margarine, 1 tsp  Salt, 1/4 tsp  Pureed [place], barley soup, 3/4 cup  Barbecue chicken, 3 oz  Mashed potatoes, 1/2 cup  Ground fresh broccoli, 1/2 cup  Frozen yogurt, 1/2 cup  Decaffeinated tea, 1 cup  Non-dairy creamer, 2 Tbsp  Margarine, 1 tsp  Salt, 1/4 tsp  Sugar, 1 tsp    LEVEL 4:  REGULAR FOODS  -Foods in this group are soft, moist, regularly textured foods.   -This level includes meat and breads, which tend to be the hardest things to swallow.   -Eat very slowly, chew well and continue to avoid carbonated drinks. -most people are at this level in 4-6  weeks  Hot Foods Cold Foods  Baked fish or skinned Soft cheeses - cottage cheese  Souffles and omelets Cream cheese  Eggs Yogurt  Stuffed shells Milk  Spaghetti with meat sauce Milkshakes  Cooked cereal Cold dry cereals (no nuts, dried fruit, coconut)  [place] toast or pancakes Crackers  Buttered toast Fruit juices or nectars  Noodles or pasta (no rice) Canned fruit  Potatoes (all types) Ripe bananas  Soft, cooked vegetables (no corn, lima, or baked beans) Peeled, ripe, fresh fruit  Creamed soups or vegetable soup Cakes (no nuts, dried fruit, coconut)  Canned chicken noodle soup Plain doughnuts  Gravies Ice cream  Bacon dressing Pudding, mousse, custard  Sauces:  Cheese, creamed, barbecue, tomato, white Fruit ice, [place] ice, sherbet  Decaffeinated tea or coffee Whipped gelatin  Pork chops Regular gelatin   Canned fruited gelatin molds   Sugar, syrup, honey, jam, jelly   Cream   Non-dairy   Margarine   Oil   Mayonnaise   Ketchup   Mustard    If you have any questions please call our office at [place]: [phone].  Dumping Syndrome Diet Dumping syndrome is term used to describe a group of symptoms that occur when the stomach empties too quickly. Dumping syndrome usually happens after a stomach surgery or gastric bypass surgery for weight loss. The symptoms may occur 10 minutes to 3 hours after eating.  SYMPTOMS  Symptoms of the stomach emptying too quickly are:  Abdominal cramps.  Abdominal pain.  Nausea.  Feeling dizzy.  Feeling weak.  High heart rate.  Sweating. These symptoms can be controlled by changing the type of food you eat. Avoiding sweet and fatty foods can be helpful. Foods that are too sweet are known as concentrated sweets.  CHOOSING FOODS  Breads and Starches. These should be limited in general, depending on type of gastric surgery.  Allowed: Whole wheat bread, brown rice, whole grain pasta, corn, beans, potatoes without added  fat.  Avoid: Sweet rolls, pastries, sugary cereals of any type. Vegetables  Allowed: Fresh, frozen, and canned vegetables.  Avoid: Creamed or breaded vegetables.  Vegetables in a cheese sauce. Fruit  Allowed: All fresh, canned in natural juice or light syrup, or frozen fruits.  Avoid: Canned fruit in heavy syrup, commercially prepared fruit smoothies, fruit juice. Meat and Meat Substitutes  Allowed: Plain beef, chicken, fish, [place], lamb, veal, pork, ham. Eggs, soy meat substitutes, nuts.  Avoid: Creamed, breaded or fried meat, fish, or fowl. Full-fat sausage products. Milk  Allowed: Low-fat or fat-free milk, low-fat plain yogurt or light yogurt products.  Avoid: Milk (whole, 2%). Half and half, heavy cream, whipping cream. Soups and Combination Foods  Allowed: Bouillon, broth, vegetable soups, clear soups, consomms. Homemade soups made with low-fat ingredients  Avoid: Cream soups, chowders. Macaroni and cheese, pizza. Desserts and Sweets  Allowed: Sugar-free gelatin, sugar-free pudding.  Avoid: All desserts including sugar-free pies, cakes, candies, cookies. Fats and Oils  Allowed: Small amounts of added fat (1 tsp) including butter, margarines, dressings, vegetable oils, shortening, mayonnaise, sugar-free nondairy cream. Reduced-fat cream cheese, low-fat sour cream, peanut butter (1 tbs).  Avoid: Bacon, cream, cream cheese, sour cream, fried foods. Beverages  Allowed: Sugar-free flavored water, decaf unsweetened tea and coffee. Sugar-free hot chocolate.  Avoid: Hot chocolate. Regular sugar and diet carbonated beverages, juice, any other regular sugar sweetened beverages Condiments  Allowed: Sugar substitutes, sugar-free jam and jelly, sugar-free syrup.  Avoid: Honey, sugar, syrup, jam, jelly, chocolate syrup, molasses, brown sugar, corn syrup, agave nectar. Document Released: 02/14/2011 Document Revised: 05/20/2011 Document Reviewed: 02/14/2011 [facility] Patient  Information 2015 [place], [place]. This information is not intended to replace advice given to you by your health care provider. Make sure you discuss any questions you have with your health care provider.  GETTING TO GOOD BOWEL HEALTH. Irregular bowel habits such as constipation and diarrhea can lead to many problems over time.  Having one soft bowel movement a day is the most important way to prevent further problems.  The anorectal canal is designed to handle stretching and feces to safely manage our ability to get rid of solid waste (feces, poop, stool) out of our body.  BUT, hard constipated stools can act like ripping concrete bricks and diarrhea can be a burning fire to this very sensitive area of our body, causing inflamed hemorrhoids, anal fissures, increasing risk is perirectal abscesses, abdominal pain/bloating, an making irritable bowel worse.     The goal: ONE SOFT BOWEL MOVEMENT A DAY!  To have soft, regular bowel movements:   Drink at least 8 tall glasses of water a day.    Take plenty of fiber.  Fiber is the undigested part of plant food that passes into the colon, acting s natures broom to encourage bowel motility and movement.  Fiber can absorb and hold large amounts of water. This results in a larger, bulkier stool, which is soft and easier to pass. Work gradually over several weeks up to 6 servings a day of fiber (25g a day even more if needed) in the form of: o Vegetables -- Root (potatoes, carrots, turnips), leafy green (lettuce, salad greens, celery, spinach), or cooked high residue (cabbage, broccoli, etc) o Fruit -- Fresh (unpeeled skin & pulp), Dried (prunes, apricots, cherries, etc ),  or stewed ( applesauce)  o Whole grain breads, pasta, etc (whole wheat)  o Bran cereals   Bulking Agents -- This type of water-retaining fiber generally is easily obtained each day by one of the following:  o Psyllium bran -- The psyllium plant is remarkable because its ground seeds can retain  so much water. This  product is available as Metamucil, Konsyl, Effersyllium, Per Diem Fiber, or the less expensive generic preparation in drug and health food stores. Although labeled a laxative, it really is not a laxative.  o Methylcellulose -- This is another fiber derived from wood which also retains water. It is available as Citrucel. o Polyethylene Glycol - and artificial fiber commonly called Miralax or Glycolax.  It is helpful for people with gassy or bloated feelings with regular fiber o Flax Seed - a less gassy fiber than psyllium  No reading or other relaxing activity while on the toilet. If bowel movements take longer than 5 minutes, you are too constipated  AVOID CONSTIPATION.  High fiber and water intake usually takes care of this.  Sometimes a laxative is needed to stimulate more frequent bowel movements, but   Laxatives are not a good long-term solution as it can wear the colon out. o Osmotics (Milk of Magnesia, Fleets phosphosoda, Magnesium citrate, MiraLax, GoLytely) are safer than  o Stimulants (Senokot, Castor Oil, Dulcolax, Ex Lax)    o Do not take laxatives for more than 7days in a row.   IF SEVERELY CONSTIPATED, try a Bowel Retraining Program: o Do not use laxatives.  o Eat a diet high in roughage, such as bran cereals and leafy vegetables.  o Drink six (6) ounces of prune or apricot juice each morning.  o Eat two (2) large servings of stewed fruit each day.  o Take one (1) heaping tablespoon of a psyllium-based bulking agent twice a day. Use sugar-free sweetener when possible to avoid excessive calories.  o Eat a normal breakfast.  o Set aside 15 minutes after breakfast to sit on the toilet, but do not strain to have a bowel movement.  o If you do not have a bowel movement by the third day, use an enema and repeat the above steps.   Controlling diarrhea o Switch to liquids and simpler foods for a few days to avoid stressing your intestines further. o Avoid dairy  products (especially milk & ice cream) for a short time.  The intestines often can lose the ability to digest lactose when stressed. o Avoid foods that cause gassiness or bloating.  Typical foods include beans and other legumes, cabbage, broccoli, and dairy foods.  Every person has some sensitivity to other foods, so listen to our body and avoid those foods that trigger problems for you. o Adding fiber (Citrucel, Metamucil, psyllium, Miralax) gradually can help thicken stools by absorbing excess fluid and retrain the intestines to act more normally.  Slowly increase the dose over a few weeks.  Too much fiber too soon can backfire and cause cramping & bloating. o Probiotics (such as active yogurt, Align, etc) may help repopulate the intestines and colon with normal bacteria and calm down a sensitive digestive tract.  Most studies show it to be of mild help, though, and such products can be costly. o Medicines: - Bismuth subsalicylate (ex. Kayopectate, Pepto Bismol) every 30 minutes for up to 6 doses can help control diarrhea.  Avoid if pregnant. - Loperamide (Immodium) can slow down diarrhea.  Start with two tablets (4mg  total) first and then try one tablet every 6 hours.  Avoid if you are having fevers or severe pain.  If you are not better or start feeling worse, stop all medicines and call your doctor for advice o Call your doctor if you are getting worse or not better.  Sometimes further testing (cultures, endoscopy, X-ray studies, bloodwork, etc)  may be needed to help diagnose and treat the cause of the diarrhea.  Managing Pain  Pain after surgery or related to activity is often due to strain/injury to muscle, tendon, nerves and/or incisions.  This pain is usually short-term and will improve in a few months.   Many people find it helpful to do the following things TOGETHER to help speed the process of healing and to get back to regular activity more quickly:  1. Avoid heavy physical activity at  first a. No lifting greater than 20 pounds at first, then increase to lifting as tolerated over the next few weeks b. Do not push through the pain.  Listen to your body and avoid positions and maneuvers than reproduce the pain.  Wait a few days before trying something more intense c. Walking is okay as tolerated, but go slowly and stop when getting sore.  If you can walk 30 minutes without stopping or pain, you can try more intense activity (running, jogging, aerobics, cycling, swimming, treadmill, sex, sports, weightlifting, etc ) d. Remember: If it hurts to do it, then dont do it!  2. Take Anti-inflammatory medication a. Choose ONE of the following over-the-counter medications: i.            Acetaminophen 500mg  tabs (Tylenol) 1-2 pills with every meal and just before bedtime (avoid if you have liver problems) ii.            Naproxen 220mg  tabs (ex. Aleve) 1-2 pills twice a day (avoid if you have kidney, stomach, IBD, or bleeding problems) iii. Ibuprofen 200mg  tabs (ex. Advil, Motrin) 3-4 pills with every meal and just before bedtime (avoid if you have kidney, stomach, IBD, or bleeding problems) b. Take with food/snack around the clock for 1-2 weeks i. This helps the muscle and nerve tissues become less irritable and calm down faster  3. Use a Heating pad or Ice/Cold Pack a. 4-6 times a day b. May use warm bath/hottub  or showers  4. Try Gentle Massage and/or Stretching  a. at the area of pain many times a day b. stop if you feel pain - do not overdo it  Try these steps together to help you body heal faster and avoid making things get worse.  Doing just one of these things may not be enough.    If you are not getting better after two weeks or are noticing you are getting worse, contact our office for further advice; we may need to re-evaluate you & see what other things we can do to help.

## 2014-06-24 NOTE — Anesthesia Preprocedure Evaluation (Addendum)
Anesthesia Evaluation  Patient identified by MRN, date of birth, ID band Patient awake    Reviewed: Allergy & Precautions, NPO status , Patient's Chart, lab work & pertinent test results  Airway Mallampati: II  TM Distance: >3 FB Neck ROM: Full    Dental  (+) Edentulous Upper   Pulmonary Current Smoker,  breath sounds clear to auscultation  Pulmonary exam normal       Cardiovascular negative cardio ROS  Rhythm:Regular Rate:Normal     Neuro/Psych PSYCHIATRIC DISORDERS Anxiety Depression negative neurological ROS     GI/Hepatic Neg liver ROS, PUD, GERD-  Medicated,  Endo/Other  negative endocrine ROS  Renal/GU negative Renal ROS  negative genitourinary   Musculoskeletal negative musculoskeletal ROS (+)   Abdominal (+) + obese,   Peds negative pediatric ROS (+)  Hematology negative hematology ROS (+)   Anesthesia Other Findings   Reproductive/Obstetrics negative OB ROS                           Anesthesia Physical Anesthesia Plan  ASA: II  Anesthesia Plan: General   Post-op Pain Management:    Induction: Intravenous, Rapid sequence and Cricoid pressure planned  Airway Management Planned: Oral ETT  Additional Equipment:   Intra-op Plan:   Post-operative Plan: Extubation in OR  Informed Consent: I have reviewed the patients History and Physical, chart, labs and discussed the procedure including the risks, benefits and alternatives for the proposed anesthesia with the patient or authorized representative who has indicated his/her understanding and acceptance.   Dental advisory given  Plan Discussed with: CRNA  Anesthesia Plan Comments:         Anesthesia Quick Evaluation

## 2014-06-24 NOTE — Anesthesia Procedure Notes (Signed)
Procedure Name: Intubation Date/Time: 06/24/2014 12:23 PM Performed by: Glory Buff Pre-anesthesia Checklist: Patient identified, Emergency Drugs available, Suction available and Patient being monitored Patient Re-evaluated:Patient Re-evaluated prior to inductionOxygen Delivery Method: Circle System Utilized Preoxygenation: Pre-oxygenation with 100% oxygen Intubation Type: IV induction, Rapid sequence and Cricoid Pressure applied Ventilation: Mask ventilation without difficulty Laryngoscope Size: Mac and 3 Grade View: Grade I Tube type: Oral Tube size: 7.0 mm Number of attempts: 1 Airway Equipment and Method: Stylet Placement Confirmation: ETT inserted through vocal cords under direct vision,  positive ETCO2 and breath sounds checked- equal and bilateral Secured at: 21 cm Tube secured with: Tape Dental Injury: Teeth and Oropharynx as per pre-operative assessment

## 2014-06-24 NOTE — Op Note (Signed)
06/24/2014  6:05 PM  PATIENT:  Shelly Hood  59 y.o. 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 General anesthesia. 2. Local anesthetic in a field block around all port sites.  SPECIMEN:  Mediastinal hernia sac (not sent).  DRAINS:  A 19-French Blake drain goes from the right upper quadrant along the lesser curvature of the stomach into the mediastinum.  COUNTS:  YES  PLAN OF CARE: Admit to inpatient   PATIENT DISPOSITION:  PACU - hemodynamically stable.  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  The patient has had extensive work-up & we feel the patient will benefit from surgery :  The anatomy & physiology of the foregut and digestive tract was discussed.  The gastric pathophysiology was discussed.  Natural history risks without surgery was discussed.   The patient's situation is not adequately controlled by medicines and other non-operative treatments.  I feel the risks of no intervention will lead to serious problems that outweigh the operative risks; therefore, I recommended surgery to resect part of the stomach.  Need for a thorough workup to rule out the differential diagnosis and plan treatment was  explained.  I explained laparoscopic techniques with possible need for an open approach.  Risks such as bleeding, infection, abscess, leak, need for further treatment, heart attack, death, and other risks were discussed.   I noted a good likelihood this will help address the problem.  Goals of post-operative recovery were discussed as well.  Possibility that this will not correct all symptoms was explained.  Post-operative dysphagia, need for short-term liquid & pureed diet, inability to vomit, possibility of reherniation, possible need for medicines to help control symptoms in addition to surgery were discussed.  Possible need for a feeding tube was discussed.  We will work to minimize complications.   Educational handouts further explaining the pathology, treatment options, and dysphagia diet was given as well.  Questions were answered.  The patient expresses understanding & wishes to proceed with surgery.  The anatomy & physiology of the foregut and anti-reflux mechanism was discussed.  The pathophysiology of hiatal herniation and GERD was discussed.  Natural history risks without surgery was discussed.   The patient's symptoms are not adequately controlled by medicines and other non-operative treatments.  I feel the risks of no intervention will lead to serious problems that outweigh the operative risks; therefore, I recommended surgery to reduce the hiatal hernia out of the chest and fundoplication to rebuild the anti-reflux valve and control reflux better.  Need for a thorough workup to rule out the differential diagnosis and plan treatment was explained.  I explained laparoscopic techniques with possible need for an open approach.  Risks such as bleeding, infection, abscess, leak, need for further treatment, heart attack, death, and other risks were discussed.   I noted a good  likelihood this will help address the problem.  Goals of post-operative recovery were discussed as well.  Possibility that this  will not correct all symptoms was explained.  Post-operative dysphagia, need for short-term liquid & pureed diet, inability to vomit, possibility of reherniation, possible need for medicines to help control symptoms in addition to surgery were discussed.  We will work to minimize complications.   Educational handouts further explaining the pathology, treatment options, and dysphagia diet was given as well.  Questions were answered.  The patient expresses understanding & wishes to proceed with surgery.  OR FINDINGS:   Obvious stricture that pyloric region several centimeters long and thickened.  No evidence of any major lymphadenopathy.  No evidence of metastatic disease.  Distal third of the stomach particularly antrum & duodenal bulb resected.  Roux-en-Y reconstruction.  Antecolic GJ.  50 cm gastrojejunal limb.  50 cm biliopancreatic limb.  Mild sliding paraesophageal hiatal hernia   There was a 5 x 5 cm hiatal defect.  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  The patient has an anterior  gastropexy.  DESCRIPTION:   Informed consent was confirmed.  The patient received IV antibiotics prior to incision.  The underwent general anesthesia without difficulty.  A Foley catheter sterilely placed.  The patient was positioned in split leg with arms tucked. The abdomen was prepped and draped in the sterile fashion.  Surgical time-out confirmed our plan.  I placed a 8 mm port in the left anterior axillary line using a Veress technique with the varies needle at palmers point in the left upper quadrant.  Entry was clean.  Camera inspection revealed no injury.  Under direct visualization, I placed Robotic ports  At the umbilical level over towards the right side.  Camera port infraumbilical.  12 port right lower quadrant.  I also placed a 5 mm port in the left subxiphoid region under direct visualization.  I removed that and placed an Omega-shaped rigid Nathanson liver retractor to lift the left lateral sector of the liver  anteriorly to expose the esophageal hiatus.  This was secured to the bed using the iron man system.  I placed another 83mm port in the subxiphoid region.  Patient was placed in moderate reverse Trendelenburg.   Da Vinci intuitive robotic used to dock.  I freed adhesions to the gallbladder and infundibulum.  I used that to follow down to help identify the common bile duct.  I identified the duodenal sweep.  Freed greater omentum adhesions off of it.  I skeletonized lymphatics off the Portal vein and common bile duct to get surrounding lymph nodes around the duodenal bulb and rolled it inferiorly to expose the superior top of the duodenal bulb. Identified the gastroduodenal artery, skeletonized it, and transected with the vessel sealer.  Mobilized the duodenal sweep and a lateral to medial fashion = kocherization.  There was some limited mobility.  I entered the lesser sac about half way along the greater curvature stomach keeping a 5 cm rim of greater omentum on the stomach.  I follow that until I came to the duodenal bulb. Chose an area 3 cm distal to the most distal part of the obvious stricture.  Created a window there.  Next I focused on vagotomy.  I placed the stomach and esophagus on axial tension.  I transected the hepatogastric ligament and came anteriorly and to identify the right crus.  It was able to bluntly enter into the anterior mediastinum to identify a mild sliding hiatal hernia.  I  freed phrenoesophageal adhesions along the medial right crus and followed it to the base.  I entered into the posterior mediastinum.  I came around anteriorly and freed off adhesions to the left medial crus.  Freed the stomach off the left lateral crus as well.  His able to create a window around the esophagus.  Placed esophagus on axial tension and did a type II mediastinal dissection up 10 cm into the mediastinum to get some mobility and help reduce the esophagus back into the abdomen.  I identified and  Skeletonized the vagus nerves 8 cm from the cardia more proximally.  I transected the anterior and posterior vagus nerve trunks 5 cm.  Specimens sent off for verification.  This allowed a good truncal vagotomy.  I decided to close the hiatal hernia primarily with #1 Ethibond suture interrupted horizontal mattress sutures 3.  This came together well.  The crura had good tissues.  I held off on doing any pledgets.  3 cm of esophagus rested in the abdominal cavity with no tension providing a good repair.  I did do a left anterior gastropexy of the inner fundus/cardia to the left anterior crus.  I did not do a fundoplication since I ended up doing a Roux-en-Y reconstruction.  Chose an area of the distal stomach and antrum.  Transected with a green load robotic stapler 3 firing obliquely towards lesser curve just proximal to the last crows foot.  The few remaining retroperitoneal adhesions were freed off.  I transected the duodenum just distal to the bulb, 3 cm distal to the stricture.  Did that with a single firing of a blue load robotic stapler.  The stump shrunk back into the retroperitoneum.   I took some surrounding omentum and provided omentopexy with 2-0 silk suture over the duodenal stump staple line to good result.  Also use that to tie down the gastroduodenal artery stump.  Stayed away from the common bile duct and portal vein.  Because the stump was retracted, I felt it would not reach well for Bilroth I reconstruction.  Given her significant reflux symptoms,  I felt the patient would benefit from Roux-en-Y reconstruction.  I chose to do that. I elevated the transverse colon.  Identified the ligament of Treitz.  I followed the jejunum down 50 cm distally.  Placed a silk stitch on this region. This area of jejunum could reach up to the proximal stomach quite well.  I went and transected at this area using a robotic blue load stapler. I transected the jejunal mesentery radially for many centimeters so  that the gastrojejunal limb could easily reach with minimal tension.   I transect the thick greater omentum radially to allow the gastrojejunal limb to come over antecolic with less tension.  Mild hematoma on the biliary pancreatic limb but no evidence of any necrosis or ischemia.  Proceeded with gastrojejunostomy.  I placed a 2-0 silk stitch on the jejunum and the posterior proximal gastric wall.  I did a side-to-side stapled Gastrojejunal anastomosis on the posterior gastric wall with a 45 blue load stapler.  I closed the common staple enterotomy defect with a 2-0 V lock absorbable suture in a running [place] fashion.  Inspection has saw no evidence of any leak or ischemia.  I did not leave a candycane jejunal limb of much length.  The distal jejunal stump 3cm end pointing towards the spleen.  Left lateral.  I reran the bowel and performed a jejunojejunostomy 50 cm distal to the gastrojejunal  limb with the distal end of the biliary pancreatic limb.  I did this also in a side-to-side stapled fashion.  Again closed the common staple line enterotomy defect with a V lock in a running [place] fashion.  i closed the mesenteric defects at the jejunojejunostomy and gastrojejunostomy using 2-0 suture in running fashion.   I did irrigation and ensured hemostasis.  I saw no evidence of any leak or perforation or other abnormality.  Because of that, I did not leave a drain.  It had anesthesia pass a nasogastric tube into the stomach.  Taped the nose.I removed the [facility] liver retractor under direct visualization.  I evacuated carbon dioxide and removed the ports.  The skin was closed with Monocryl and sterile dressings applied.  The patient is being extubated and brought back to the recovery room.  I discussed postop care in detail with the patient and family in in the office.  Discussed again with the patient in the holding area.  I updated the status of the patient to the patient's spouse & family.   I made  recommendations.  I answered questions.  Understanding & appreciation was expressed.  Patient's urine output was poor intraoperatively but has perked up markedly after evacuation of  capnoperitoneum. Looks comfortable in recovery room.   [name], M.D., F.A.C.S. Gastrointestinal and Minimally Invasive Surgery [facility] [address], [place], [place] [address] [phone] Main / Paging

## 2014-06-24 NOTE — H&P (Signed)
Shelly Hood 05/04/2014 11:05 AM Location: [facility] Patient #: [id number] DOB: 09/22/1955 Married / Language: English / Race: Undefined Female  History of Present Illness  Patient words: gastric ulcer.  The patient is a 59 year old female who presents with a gastric outlet obstruction. Patient sent by her gastroenterologist, Dr. 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 Pleasant active woman. Here today with her husband. Smoker but nearly quitting. Has had worsening heartburn and reflux for several years. Was placed on omeprazole. Initially seemed to help. However had worsening belching and heartburn last summer. Then began to have nausea and vomiting after eating larger meals. Switch to Nexium. Persisted. Sent to gastroenterology. EGD done revealed smooth benign-appearing stricture at pylorus. Balloon dilation Dial done. She said that helped for a few weeks. However she began to have recurrent symptoms. Endoscopy and balloon dilation repeated last month. Only helped for a few days. Based on concerns, surgical consultation requested to see if resection needed. 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 Very physically active. Smokes less than a half pack a day. Normally has bowel movements every day but lately has had more loose stools. His had dwindling tolerance to larger meals. Can tolerate most liquids and pured foods but larger meals especially at night and cause a lot of pain. Increasing belching. Often more foul now. She's not had any abdominal surgeries. No history of Crohn's. No ulcerative colitis. She does not need remember any history of prior ulcer disease. She rarely drinks coffee or has citrus. She only occasionally drinks soda pop - small cans at most   Other Problems [name], LPN; 4/40/3474 25:95 AM) Anxiety Disorder Gastric Ulcer Gastroesophageal Reflux  Disease Hypercholesterolemia  Diagnostic Studies History [name], LPN; 6/38/7564 33:29 AM) Colonoscopy 5-10 years ago Mammogram within last year Pap Smear 1-5 years ago  Allergies [name], LPN; 07/27/8414 60:63 AM) Codeine Phosphate *ANALGESICS - OPIOID* Nausea.  Medication History [name], LPN; 0/16/0109 32:35 AM) ALPRAZolam (0.25MG  Tablet, Oral) Active. Atorvastatin Calcium (20MG  Tablet, Oral) Active. NexIUM (40MG  Capsule DR, Oral) Active. FLUoxetine HCl (40MG  Capsule, Oral) Active. Oscal 500/200 D-3 (500-200MG -UNIT Tablet, Oral) Active.  Social History [name], LPN; 5/73/2202 54:27 AM) Caffeine use Coffee, Tea. No alcohol use No drug use Tobacco use Current every day smoker.  Family History [name], LPN; 0/62/3762 83:15 AM) Diabetes Mellitus Brother, Mother. Heart Disease Brother.  Pregnancy / Birth History [name], LPN; 1/76/1607 37:10 AM) Age at menarche 42 years. Age of menopause <47 Gravida 2 Irregular periods Maternal age 61-20 Para 2  Review of Systems [name] LPN; 09/04/9483 46:27 AM) General Not Present- Appetite Loss, Chills, Fatigue, Fever, Night Sweats, Weight Gain and Weight Loss. Skin Present- Dryness. Not Present- Change in Wart/Mole, Hives, Jaundice, New Lesions, Non-Healing Wounds, Rash and Ulcer. HEENT Present- Wears glasses/contact lenses. Not Present- Earache, Hearing Loss, Hoarseness, Nose Bleed, Oral Ulcers, Ringing in the Ears, Seasonal Allergies, Sinus Pain, Sore Throat, Visual Disturbances and Yellow Eyes. Respiratory Present- Snoring. Not Present- Bloody sputum, Chronic Cough, Difficulty Breathing and Wheezing. Breast Not Present- Breast Mass, Breast Pain, Nipple Discharge and Skin Changes. Cardiovascular Not Present- Chest Pain, Difficulty Breathing Lying Down, Leg Cramps, Palpitations, Rapid Heart Rate, Shortness of Breath and Swelling of Extremities. Gastrointestinal  Present- Bloating, Excessive gas, Gets full quickly at meals and Indigestion. Not Present- Abdominal Pain, Bloody Stool, Change in Bowel Habits, Chronic diarrhea, Constipation, Difficulty Swallowing, Hemorrhoids, Nausea, Rectal Pain and Vomiting. Female  Genitourinary Not Present- Frequency, Nocturia, Painful Urination, Pelvic Pain and Urgency. Musculoskeletal Not Present- Back Pain, Joint Pain, Joint Stiffness, Muscle Pain, Muscle Weakness and Swelling of Extremities. Neurological Not Present- Decreased Memory, Fainting, Headaches, Numbness, Seizures, Tingling, Tremor, Trouble walking and Weakness. Psychiatric Present- Anxiety. Not Present- Bipolar, Change in Sleep Pattern, Depression, Fearful and Frequent crying. Endocrine Present- Hot flashes. Not Present- Cold Intolerance, Excessive Hunger, Hair Changes, Heat Intolerance and New Diabetes. Hematology Not Present- Easy Bruising, Excessive bleeding, Gland problems, HIV and Persistent Infections.   Vitals Claiborne Billings Dockery LPN; 2/87/8676 72:09 AM) 05/04/2014 11:07 AM Weight: 162 lb Height: 59in Body Surface Area: 1.75 m Body Mass Index: 32.72 kg/m Temp.: 98.4F  Pulse: 82 (Regular)  Resp.: 16 (Unlabored)  BP: 104/68 (Sitting, Left Arm, Standard)    Physical Exam [name] MD; 05/04/2014 11:39 AM) General Mental Status-Alert. General Appearance-Not in acute distress, Not Sickly. Orientation-Oriented X3. Hydration-Well hydrated. Voice-Normal. Note: Energetic/perky. Pleasant.   Integumentary Global Assessment Upon inspection and palpation of skin surfaces of the - Axillae: non-tender, no inflammation or ulceration, no drainage. and Distribution of scalp and body hair is normal. General Characteristics Temperature - normal warmth is noted.  Head and Neck Head-normocephalic, atraumatic with no lesions or palpable masses. Face Global Assessment - atraumatic, no absence of expression. Neck Global  Assessment - no abnormal movements, no bruit auscultated on the right, no bruit auscultated on the left, no decreased range of motion, non-tender. Trachea-midline. Thyroid Gland Characteristics - non-tender.  Eye Eyeball - Left-Extraocular movements intact, No Nystagmus. Eyeball - Right-Extraocular movements intact, No Nystagmus. Cornea - Left-No Hazy. Cornea - Right-No Hazy. Sclera/Conjunctiva - Left-No scleral icterus, No Discharge. Sclera/Conjunctiva - Right-No scleral icterus, No Discharge. Pupil - Left-Direct reaction to light normal. Pupil - Right-Direct reaction to light normal.  ENMT Ears Pinna - Left - no drainage observed, no generalized tenderness observed. Right - no drainage observed, no generalized tenderness observed. Nose and Sinuses External Inspection of the Nose - no destructive lesion observed. Inspection of the nares - Left - quiet respiration. Right - quiet respiration. Mouth and Throat Lips - Upper Lip - no fissures observed, no pallor noted. Lower Lip - no fissures observed, no pallor noted. Nasopharynx - no discharge present. Oral Cavity/Oropharynx - Tongue - no dryness observed. Oral Mucosa - no cyanosis observed. Hypopharynx - no evidence of airway distress observed.  Chest and Lung Exam Inspection Movements - Normal and Symmetrical. Accessory muscles - No use of accessory muscles in breathing. Palpation Palpation of the chest reveals - Non-tender. Auscultation Breath sounds - Normal and Clear.  Cardiovascular Auscultation Rhythm - Regular. Murmurs & Other Heart Sounds - Auscultation of the heart reveals - No Murmurs and No Systolic Clicks.  Abdomen Inspection Inspection of the abdomen reveals - No Visible peristalsis and No Abnormal pulsations. Umbilicus - No Bleeding, No Urine drainage. Palpation/Percussion Palpation and Percussion of the abdomen reveal - Soft, Non Tender, No Rebound tenderness, No Rigidity (guarding) and No  Cutaneous hyperesthesia. Note: Soft. Nontender nondistended. No umbilical hernia. No diastases.   Female Genitourinary Sexual Maturity Tanner 5 - Adult hair pattern. Note: No vaginal bleeding nor discharge. No inguinal hernias.   Peripheral Vascular Upper Extremity Inspection - Left - No Cyanotic nailbeds, Not Ischemic. Right - No Cyanotic nailbeds, Not Ischemic.  Neurologic Neurologic evaluation reveals -normal attention span and ability to concentrate, able to name objects and repeat phrases. Appropriate fund of knowledge , normal sensation and normal coordination. Mental Status Affect - not angry, not  paranoid. Cranial Nerves-Normal Bilaterally. Gait-Normal.  Neuropsychiatric Mental status exam performed with findings of-able to articulate well with normal speech/language, rate, volume and coherence, thought content normal with ability to perform basic computations and apply abstract reasoning and no evidence of hallucinations, delusions, obsessions or homicidal/suicidal ideation.  Musculoskeletal Global Assessment Spine, Ribs and Pelvis - no instability, subluxation or laxity. Right Upper Extremity - no instability, subluxation or laxity.  Lymphatic Head & Neck  General Head & Neck Lymphatics: Bilateral - Description - No Localized lymphadenopathy. Axillary  General Axillary Region: Bilateral - Description - No Localized lymphadenopathy. Femoral & Inguinal  Generalized Femoral & Inguinal Lymphatics: Left - Description - No Localized lymphadenopathy. Right - Description - No Localized lymphadenopathy.    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  I think this will require resection or at least bypass. Hopefully can be antrectomy and  Billroth I reconstruction. Probable selective versus truncal vagotomy as well. Risk reasonable minimally invasive approach. We'll see if can do robotically.  I did get an upper GI series to get a label and and see how bad her stomach and stricture are.  I did discuss surgery in detail. She seemed surprised that she may need to stay week. I cautioned her that gastric ileus can sometimes be even longer. Hopefully if she is otherwise active her recovery will not be too difficult.  I strongly encouraged her to completely quit smoking. That would minimize perioperative complications. Continue to be very active physically.  She and her husband wish to be aggressive and have this done as soon as possible. We'll work to coordinate and schedule a convenient time.   Current Plans  The anatomy & physiology of the foregut and digestive tract was discussed. The gastric pathophysiology was discussed. Natural history risks without surgery was discussed. The patient's situation is not adequately controlled by medicines and other non-operative treatments. I feel the risks of no intervention will lead to serious problems that outweigh the operative risks; therefore, I recommended surgery to resect part of the stomach. Need for a thorough workup to rule out the differential diagnosis and plan treatment was explained. I explained laparoscopic techniques with possible need for an open approach.  Risks such as bleeding, infection, abscess, leak, need for further treatment, heart attack, death, and other risks were discussed. I noted a good likelihood this will help address the problem. Goals of post-operative recovery were discussed as well. Possibility that this will not correct all symptoms was explained. Post-operative dysphagia, need for short-term liquid & pureed diet, inability to vomit, possibility of reherniation, possible need for medicines to help control symptoms in addition to surgery were discussed. Possible  need for a feeding tube was discussed. Possible delayed gastric emptying or dumping syndromes discussed as well since at least selective versus truncal vagotomy will be required. We will work to minimize complications. Educational handouts further explaining the pathology, treatment options, and dysphagia diet was given as well. Questions were answered. The patient expresses understanding & wishes to proceed with surgery. Schedule for Surgery Written instructions provided Pt Education - [facility] Laparosopic Post Op [facility]CI (Oliviya Gilkison) Pt Education - [place] (Jazmaine Fuelling) Peptic Ulcer A peptic ulcer is a sore in the lining of your esophagus (esophageal ulcer), stomach (gastric ulcer), or in the first part of your small intestine (duodenal ulcer).  The ulcer causes erosion into the deeper tissue. CAUSES Normally, the lining of the stomach and the small intestine protects itself from the acid that digests food. The protective lining can be damaged by: An infection caused by a bacterium called Helicobacter pylori (H. pylori). Regular use of nonsteroidal anti-inflammatory drugs (NSAIDs), such as ibuprofen or aspirin. Smoking tobacco. Other risk factors include being older than 55, drinking alcohol excessively, and having a family history of ulcer disease. SYMPTOMS Burning pain or gnawing in the area between the chest and the belly button. Heartburn. Nausea and vomiting. Bloating. The pain can be worse on an empty stomach and at night. If the ulcer results in bleeding, it can cause: Black, tarry stools. Vomiting of bright red blood. Vomiting of coffee-ground-looking materials. DIAGNOSIS A diagnosis is usually made based upon your history and an exam. Other tests and procedures may be performed to find the cause of the ulcer. Finding a cause will help determine the best treatment. Tests and procedures may include: Blood tests, stool tests, or breath tests to check for the bacterium H. pylori. An upper  gastrointestinal (GI) series of the esophagus, stomach, and small intestine. An endoscopy to examine the esophagus, stomach, and small intestine. A biopsy. TREATMENT Treatment may include: Eliminating the cause of the ulcer, such as smoking, NSAIDs, or alcohol. Medicines to reduce the amount of acid in your digestive tract. Antibiotic medicines if the ulcer is caused by the H. pylori bacterium. An upper endoscopy to treat a bleeding ulcer. Surgery if the bleeding is severe or if the ulcer created a hole somewhere in the digestive system. HOME CARE INSTRUCTIONS Avoid tobacco, alcohol, and caffeine. Smoking can increase the acid in the stomach, and continued smoking will impair the healing of ulcers. Avoid foods and drinks that seem to cause discomfort or aggravate your ulcer. Only take medicines as directed by your caregiver. Do not substitute over-the-counter medicines for prescription medicines without talking to your caregiver. Keep any follow-up appointments and tests as directed. SEEK MEDICAL CARE IF: Your do not improve within 7 days of starting treatment. You have ongoing indigestion or heartburn. SEEK IMMEDIATE MEDICAL CARE IF: You have sudden, sharp, or persistent abdominal pain. You have bloody or dark black, tarry stools. You vomit blood or vomit that looks like coffee grounds. You become light-headed, weak, or feel faint. You become sweaty or clammy. MAKE SURE YOU: Understand these instructions. Will watch your condition. Will get help right away if you are not doing well or get worse. Document Released: 02/23/2000 Document Revised: 07/12/2013 Document Reviewed: 09/25/2011 [facility] Patient Information 2015 [place], [place]. This information is not intended to replace advice given to you by your health care provider. Make sure you discuss any questions you have with your health care provider. Referred to Gastroenterology, for evaluation and follow up  Physiological scientist).  [name], M.D., F.A.C.S. Gastrointestinal and Minimally Invasive Surgery [facility] [address], [place], [place] [address] [phone] Main / Paging

## 2014-06-24 NOTE — Anesthesia Postprocedure Evaluation (Addendum)
  Anesthesia Post-op Note  Patient: Shelly Hood  Procedure(s) Performed: Procedure(s) (LRB): ROBOTIC TRUNCAL VAGOTOMY  (N/A) ROBOTIC DISTAL GASTRECTOMY (N/A) ROBOTIC ROUX-EN-Y CONSTRUCTION WITH HIATAL HERNIA REPAIR (N/A)  Patient Location: PACU  Anesthesia Type: General  Level of Consciousness: awake and alert   Airway and Oxygen Therapy: Patient Spontanous Breathing  Post-op Pain: mild  Post-op Assessment: Post-op Vital signs reviewed, Patient's Cardiovascular Status Stable, Respiratory Function Stable, Patent Airway and No signs of Nausea or vomiting  Last Vitals:  Filed Vitals:   06/24/14 1901  BP: 141/66  Pulse: 103  Temp: 36.9 C  Resp: 16    Post-op Vital Signs: stable   Complications: No apparent anesthesia complications. Urine output will be followed closely tonight. Urine output picked up in PACU. Dr. Johney [place] aware of urine output. Orders written.

## 2014-06-25 ENCOUNTER — Inpatient Hospital Stay (HOSPITAL_COMMUNITY): Payer: BLUE CROSS/BLUE SHIELD

## 2014-06-25 LAB — CBC WITH DIFFERENTIAL/PLATELET
BASOS PCT: 0 % (ref 0–1)
Basophils Absolute: 0 10*3/uL (ref 0.0–0.1)
Eosinophils Absolute: 0 10*3/uL (ref 0.0–0.7)
Eosinophils Relative: 0 % (ref 0–5)
HEMATOCRIT: 38 % (ref 36.0–46.0)
HEMOGLOBIN: 12.9 g/dL (ref 12.0–15.0)
LYMPHS PCT: 6 % — AB (ref 12–46)
Lymphs Abs: 0.6 10*3/uL — ABNORMAL LOW (ref 0.7–4.0)
MCH: 30.1 pg (ref 26.0–34.0)
MCHC: 33.9 g/dL (ref 30.0–36.0)
MCV: 88.8 fL (ref 78.0–100.0)
MONOS PCT: 11 % (ref 3–12)
Monocytes Absolute: 1 10*3/uL (ref 0.1–1.0)
NEUTROS ABS: 7.6 10*3/uL (ref 1.7–7.7)
NEUTROS PCT: 83 % — AB (ref 43–77)
Platelets: 41 10*3/uL — ABNORMAL LOW (ref 150–400)
RBC: 4.28 MIL/uL (ref 3.87–5.11)
RDW: 12.9 % (ref 11.5–15.5)
WBC: 9.2 10*3/uL (ref 4.0–10.5)

## 2014-06-25 LAB — HEMOGLOBIN AND HEMATOCRIT, BLOOD
HEMATOCRIT: 37 % (ref 36.0–46.0)
Hemoglobin: 12.4 g/dL (ref 12.0–15.0)

## 2014-06-25 MED ORDER — METOCLOPRAMIDE HCL 5 MG/ML IJ SOLN
5.0000 mg | Freq: Four times a day (QID) | INTRAMUSCULAR | Status: DC
  Administered 2014-06-25 – 2014-06-26 (×4): 5 mg via INTRAVENOUS
  Filled 2014-06-25 (×2): qty 1
  Filled 2014-06-25 (×4): qty 2
  Filled 2014-06-25 (×2): qty 1

## 2014-06-25 MED ORDER — IOHEXOL 300 MG/ML  SOLN
50.0000 mL | Freq: Once | INTRAMUSCULAR | Status: AC | PRN
  Administered 2014-06-25: 80 mL via ORAL

## 2014-06-25 NOTE — Progress Notes (Signed)
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 Active Problems:   Anxiety disorder   GERD   Obesity   1 Day Post-Op  Procedure(s): PRE-OPERATIVE DIAGNOSIS: Partial Gastric Outlet Obstruction  POST-OPERATIVE DIAGNOSIS: Near complete Gastric Outlet Obstruction  PROCEDURE:  ROBOTIC TRUNCAL VAGOTOMY  ROBOTIC DISTAL GASTRECTOMY ROBOTIC ROUX-EN-Y CONSTRUCTION ROBOTIC HIATAL HERNIA REPAIR Omentopexy of duodenum Omentopexy of Gastrojejunostomy  SURGEON: Surgeon(s): Michael Boston, MD   Assessment  OK  Plan:  -NGT until gastric ileus / GJ sweeling down & passing into intestines - f/u Xray in a few hours - d/w Dr [facility] w radiology & RN PAtty -If Xray shows no obstruction of f/u film, try NGT clamping trials -reglan sched x 48 hrs to help stomach empty -anxiolysis -foley out -PPI -VTE prophylaxis- SCDs, etc -mobilize as tolerated to help recovery  I updated the status of the patient to the patient, her RN Patty, and the patient's family.  I made recommendations.  I answered questions.  Understanding & appreciation was expressed.    Shelly Hood, M.D., F.A.C.S. Gastrointestinal and Minimally Invasive Surgery [facility] [address], [place], [place] [address] [phone] Main / Paging   06/25/2014  Subjective:  Dry mouth - swabs help Walking well Hates the bed -  sore  Objective:  Vital signs:  Filed Vitals:   06/24/14 2201 06/25/14 0230 06/25/14 0504 06/25/14 1018  BP: 154/70 133/64 127/66 138/64  Pulse: 85 97 77 73  Temp: 98 F (36.7 C) 98.1 F (36.7 C) 98.1 F (36.7 C) 98.4 F (36.9 C)  TempSrc: Oral Oral Oral Oral  Resp: 15 16 16 18   Height:      Weight:      SpO2: 99% 91% 92% 97%       Intake/Output   Yesterday:  04/15 0701 - 04/16 0700 In: 5165 [I.V.:4935; NG/GT:170] Out: 1937 [Urine:1220; Emesis/NG output:50; Blood:150] This shift:  Total I/O In: 120 [NG/GT:120] Out: -   Bowel function:  Flatus: n  BM: n  Drain: thin non bilious  Physical Exam:  General: Pt awake/alert/oriented x4 in no acute distress Eyes: PERRL, normal EOM.  Sclera clear.  No icterus Neuro: CN II-XII intact w/o focal sensory/motor deficits. Lymph: No head/neck/groin lymphadenopathy Psych:  No delerium/psychosis/paranoia HENT: Normocephalic, Mucus membranes moist.  No thrush.  NGT in place Neck: Supple, No tracheal deviation Chest: No chest wall pain w good excursion CV:  Pulses intact.  Regular rhythm MS: Normal AROM mjr joints.  No obvious deformity Abdomen: Soft.  Nondistended.  Mildly tender at incisions only.  No evidence of peritonitis.  No incarcerated hernias. Ext:  SCDs BLE.  No mjr edema.  No cyanosis Skin: No petechiae / purpura  Results:   Labs: Results for orders placed or performed during the hospital encounter of 06/24/14 (from the past 48 hour(s))  Type and screen     Status: None   Collection Time: 06/24/14  9:08 AM  Result Value Ref Range   ABO/RH(D) A POS  Antibody Screen NEG    Sample Expiration 06/27/2014   ABO/Rh     Status: None   Collection Time: 06/24/14  9:30 AM  Result Value Ref Range   ABO/RH(D) A POS   Hemoglobin and hematocrit, blood     Status: None   Collection Time: 06/24/14  6:53 PM  Result Value Ref Range   Hemoglobin 12.9 12.0 - 15.0 g/dL   HCT 38.7 36.0 - 46.0 %  CBC WITH DIFFERENTIAL      Status: Abnormal   Collection Time: 06/25/14  4:50 AM  Result Value Ref Range   WBC 9.2 4.0 - 10.5 K/uL   RBC 4.28 3.87 - 5.11 MIL/uL   Hemoglobin 12.9 12.0 - 15.0 g/dL   HCT 38.0 36.0 - 46.0 %   MCV 88.8 78.0 - 100.0 fL   MCH 30.1 26.0 - 34.0 pg   MCHC 33.9 30.0 - 36.0 g/dL   RDW 12.9 11.5 - 15.5 %   Platelets 41 (L) 150 - 400 K/uL    Comment: REPEATED TO VERIFY SPECIMEN CHECKED FOR CLOTS PLATELET COUNT CONFIRMED BY SMEAR    Neutrophils Relative % 83 (H) 43 - 77 %   Neutro Abs 7.6 1.7 - 7.7 K/uL   Lymphocytes Relative 6 (L) 12 - 46 %   Lymphs Abs 0.6 (L) 0.7 - 4.0 K/uL   Monocytes Relative 11 3 - 12 %   Monocytes Absolute 1.0 0.1 - 1.0 K/uL   Eosinophils Relative 0 0 - 5 %   Eosinophils Absolute 0.0 0.0 - 0.7 K/uL   Basophils Relative 0 0 - 1 %   Basophils Absolute 0.0 0.0 - 0.1 K/uL    Imaging / Studies: No results found.  Medications / Allergies: per chart  Antibiotics: Anti-infectives    Start     Dose/Rate Route Frequency Ordered Stop   06/24/14 2200  cefoTEtan (CEFOTAN) 2 g in dextrose 5 % 50 mL IVPB     2 g 100 mL/hr over 30 Minutes Intravenous Every 12 hours 06/24/14 1900 06/24/14 2201   06/24/14 0908  cefoTEtan in Dextrose 5% (CEFOTAN) IVPB 2 g    Comments:  Pharmacy may adjust dose strength for optimal dosing.   Send with patient on call to the OR.  Anesthesia to complete antibiotic administration <71min prior to incision per [facility].   2 g Intravenous On call to O.R. 06/24/14 0907 06/24/14 1228       Note: Portions of this report may have been transcribed using voice recognition software. Every effort was made to ensure accuracy; however, inadvertent computerized transcription errors may be present.   Any transcriptional errors that result from this process are unintentional.     Shelly Hood, M.D., F.A.C.S. Gastrointestinal and Minimally Invasive Surgery [facility] [address], [place], [place]  [address] [phone] Main / Paging   06/25/2014

## 2014-06-25 NOTE — Progress Notes (Signed)
Utilization Review Completed.Shelly Hood T4/16/2016  

## 2014-06-26 ENCOUNTER — Inpatient Hospital Stay (HOSPITAL_COMMUNITY): Payer: BLUE CROSS/BLUE SHIELD

## 2014-06-26 DIAGNOSIS — D696 Thrombocytopenia, unspecified: Secondary | ICD-10-CM | POA: Diagnosis not present

## 2014-06-26 HISTORY — DX: Thrombocytopenia, unspecified: D69.6

## 2014-06-26 LAB — CBC WITH DIFFERENTIAL/PLATELET
BASOS PCT: 0 % (ref 0–1)
Basophils Absolute: 0 10*3/uL (ref 0.0–0.1)
EOS ABS: 0 10*3/uL (ref 0.0–0.7)
Eosinophils Relative: 0 % (ref 0–5)
HEMATOCRIT: 36.1 % (ref 36.0–46.0)
Hemoglobin: 12.2 g/dL (ref 12.0–15.0)
Lymphocytes Relative: 12 % (ref 12–46)
Lymphs Abs: 1.3 10*3/uL (ref 0.7–4.0)
MCH: 30.1 pg (ref 26.0–34.0)
MCHC: 33.8 g/dL (ref 30.0–36.0)
MCV: 89.1 fL (ref 78.0–100.0)
MONO ABS: 1.2 10*3/uL — AB (ref 0.1–1.0)
Monocytes Relative: 11 % (ref 3–12)
Neutro Abs: 8 10*3/uL — ABNORMAL HIGH (ref 1.7–7.7)
Neutrophils Relative %: 77 % (ref 43–77)
PLATELETS: 18 10*3/uL — AB (ref 150–400)
RBC: 4.05 MIL/uL (ref 3.87–5.11)
RDW: 13.1 % (ref 11.5–15.5)
WBC: 10.5 10*3/uL (ref 4.0–10.5)

## 2014-06-26 LAB — CBC
HCT: 36.2 % (ref 36.0–46.0)
HEMOGLOBIN: 12.3 g/dL (ref 12.0–15.0)
MCH: 30.1 pg (ref 26.0–34.0)
MCHC: 34 g/dL (ref 30.0–36.0)
MCV: 88.7 fL (ref 78.0–100.0)
Platelets: 19 10*3/uL — CL (ref 150–400)
RBC: 4.08 MIL/uL (ref 3.87–5.11)
RDW: 13.1 % (ref 11.5–15.5)
WBC: 10.7 10*3/uL — ABNORMAL HIGH (ref 4.0–10.5)

## 2014-06-26 MED ORDER — SODIUM CHLORIDE 0.9 % IJ SOLN
3.0000 mL | Freq: Two times a day (BID) | INTRAMUSCULAR | Status: DC
  Administered 2014-06-26 – 2014-06-27 (×4): 3 mL via INTRAVENOUS

## 2014-06-26 MED ORDER — FUROSEMIDE 10 MG/ML IJ SOLN
40.0000 mg | Freq: Once | INTRAMUSCULAR | Status: AC
  Administered 2014-06-26: 40 mg via INTRAVENOUS
  Filled 2014-06-26: qty 4

## 2014-06-26 MED ORDER — LACTATED RINGERS IV BOLUS (SEPSIS): 1000.0000 mL | Freq: Three times a day (TID) | INTRAVENOUS | Status: AC | PRN

## 2014-06-26 MED ORDER — SODIUM CHLORIDE 0.9 % IV SOLN: 250.0000 mL | INTRAVENOUS | Status: DC | PRN

## 2014-06-26 MED ORDER — SODIUM CHLORIDE 0.9 % IJ SOLN: 3.0000 mL | INTRAMUSCULAR | Status: DC | PRN

## 2014-06-26 NOTE — Progress Notes (Addendum)
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 Active Problems:   Anxiety disorder   GERD   Obesity   Thrombocytopenia   2 Days Post-Op  Procedure(s): PRE-OPERATIVE DIAGNOSIS: Partial Gastric Outlet Obstruction  POST-OPERATIVE DIAGNOSIS: Near complete Gastric Outlet Obstruction  PROCEDURE:  ROBOTIC TRUNCAL VAGOTOMY  ROBOTIC DISTAL GASTRECTOMY ROBOTIC ROUX-EN-Y CONSTRUCTION ROBOTIC HIATAL HERNIA REPAIR Omentopexy of duodenum Omentopexy of Gastrojejunostomy  SURGEON: Surgeon(s): Michael Boston, MD   Assessment  OK  Plan:  -NGT until gastric ileus / GJ swelling down & passing into intestines -Since Xray shows no obstruction & + jejunal gas on f/u film, try NGT clamping trial - GO SLOWLY  -thrombocytopenia of uncertain etiology - 4T score low (2 out of 8) makes heparin etiology / HIT unlikely but will check HIT panel.  Presented 10 hours after 1st heparin dose & usually 5-10 days for HIT.  Suspect Cefotetan periop since no other obvious medication culprits.  Should resolve.  Hold off on transfusion unless bleeding with drop in Hgb and/or plat count <10K.  D/w Dr Tat with Internal Medicine.  Non-heparin anticoagulation & Hematology eval if falls more.     -simplify meds for now.  -diuresis to avoid edema.  PRN IVF bolus to avoid overdoing it  -anxiolysis  -foley out  -PPI  -VTE prophylaxis- SCDs,  etc  -mobilize as tolerated to help recovery  I updated the status of the patient to the patient.  OR findings & procedure discussed again.   I made recommendations.  I answered questions.  Understanding & appreciation was expressed.    [name], M.D., F.A.C.S. Gastrointestinal and Minimally Invasive Surgery [facility] [address] [place], [place] [address] [phone] Main / Paging   06/26/2014  Subjective:  Dry mouth - swabs help Walking well Pain minimal & controlled  Objective:  Vital signs:  Filed Vitals:   06/25/14 1807 06/25/14 2123 06/26/14 0219 06/26/14 0656  BP: 146/61 151/64 148/68 138/78  Pulse: 82 85 89 95  Temp: 97.6 F (36.4 C) 97.9 F (36.6 C) 98.5 F (36.9 C) 98.5 F (36.9 C)  TempSrc: Oral Oral Oral Oral  Resp: 18 18 18 18   Height:      Weight:      SpO2: 94% 90% 90% 94%       Intake/Output   Yesterday:  04/16 0701 - 04/17 0700 In: 2110 [I.V.:1870; NG/GT:240] Out: 3500 [Urine:3000; Emesis/NG output:500] This shift:  Total I/O In: -  Out: 300 [Urine:300]  Bowel function:  Flatus: n  BM: n  Drain: thin min old blood bilious  Physical Exam:  General: Pt awake/alert/oriented x4 in no acute distress Eyes: PERRL, normal EOM.  Sclera clear.  No icterus Neuro: CN II-XII intact w/o focal sensory/motor deficits. Lymph: No head/neck/groin lymphadenopathy Psych:  No delerium/psychosis/paranoia HENT: Normocephalic, Mucus membranes moist.  No thrush.  NGT in place Neck: Supple, No tracheal deviation Chest: No chest wall pain w good excursion CV:  Pulses intact.  Regular rhythm MS:  Normal AROM mjr joints.  No obvious deformity Abdomen: Soft.  Nondistended.  Mildly tender at incisions only.  No evidence of peritonitis.  No incarcerated hernias. Ext:  SCDs BLE.  No mjr edema.  No cyanosis Skin: No petechiae / purpura.  No red/pink/purple discoloration in extremities  Results:    Labs: Results for orders placed or performed during the hospital encounter of 06/24/14 (from the past 48 hour(s))  Type and screen     Status: None   Collection Time: 06/24/14  9:08 AM  Result Value Ref Range   ABO/RH(D) A POS    Antibody Screen NEG    Sample Expiration 06/27/2014   ABO/Rh     Status: None   Collection Time: 06/24/14  9:30 AM  Result Value Ref Range   ABO/RH(D) A POS   Hemoglobin and hematocrit, blood     Status: None   Collection Time: 06/24/14  6:53 PM  Result Value Ref Range   Hemoglobin 12.9 12.0 - 15.0 g/dL   HCT 38.7 36.0 - 46.0 %  CBC WITH DIFFERENTIAL     Status: Abnormal   Collection Time: 06/25/14  4:50 AM  Result Value Ref Range   WBC 9.2 4.0 - 10.5 K/uL   RBC 4.28 3.87 - 5.11 MIL/uL   Hemoglobin 12.9 12.0 - 15.0 g/dL   HCT 38.0 36.0 - 46.0 %   MCV 88.8 78.0 - 100.0 fL   MCH 30.1 26.0 - 34.0 pg   MCHC 33.9 30.0 - 36.0 g/dL   RDW 12.9 11.5 - 15.5 %   Platelets 41 (L) 150 - 400 K/uL    Comment: REPEATED TO VERIFY SPECIMEN CHECKED FOR CLOTS PLATELET COUNT CONFIRMED BY SMEAR    Neutrophils Relative % 83 (H) 43 - 77 %   Neutro Abs 7.6 1.7 - 7.7 K/uL   Lymphocytes Relative 6 (L) 12 - 46 %   Lymphs Abs 0.6 (L) 0.7 - 4.0 K/uL   Monocytes Relative 11 3 - 12 %   Monocytes Absolute 1.0 0.1 - 1.0 K/uL   Eosinophils Relative 0 0 - 5 %   Eosinophils Absolute 0.0 0.0 - 0.7 K/uL   Basophils Relative 0 0 - 1 %   Basophils Absolute 0.0 0.0 - 0.1 K/uL  Hemoglobin and hematocrit, blood     Status: None   Collection Time: 06/25/14  4:22 PM  Result Value Ref Range   Hemoglobin 12.4 12.0 - 15.0 g/dL   HCT 37.0 36.0 - 46.0 %  CBC with Differential     Status: Abnormal   Collection Time: 06/26/14  5:13 AM  Result Value Ref Range   WBC 10.5 4.0 - 10.5 K/uL   RBC 4.05 3.87 - 5.11 MIL/uL   Hemoglobin 12.2 12.0 - 15.0 g/dL   HCT 36.1 36.0 - 46.0 %   MCV 89.1 78.0 - 100.0 fL   MCH 30.1 26.0 - 34.0 pg   MCHC 33.8 30.0 - 36.0 g/dL   RDW 13.1 11.5 - 15.5 %    Platelets 18 (LL) 150 - 400 K/uL    Comment: DELTA CHECK NOTED REPEATED TO VERIFY PLATELET COUNT CONFIRMED BY SMEAR CRITICAL RESULT CALLED TO, READ BACK BY AND VERIFIED WITH: AMANDA LEMONS,RN [id number] @ 0719 BY J SCOTTON    Neutrophils Relative % 77 43 - 77 %   Lymphocytes Relative 12 12 - 46 %   Monocytes Relative 11 3 - 12 %   Eosinophils Relative 0 0 - 5 %   Basophils Relative 0  0 - 1 %   Neutro Abs 8.0 (H) 1.7 - 7.7 K/uL   Lymphs Abs 1.3 0.7 - 4.0 K/uL   Monocytes Absolute 1.2 (H) 0.1 - 1.0 K/uL   Eosinophils Absolute 0.0 0.0 - 0.7 K/uL   Basophils Absolute 0.0 0.0 - 0.1 K/uL   Smear Review PLATELET COUNT CONFIRMED BY SMEAR   CBC     Status: Abnormal   Collection Time: 06/26/14  8:08 AM  Result Value Ref Range   WBC 10.7 (H) 4.0 - 10.5 K/uL   RBC 4.08 3.87 - 5.11 MIL/uL   Hemoglobin 12.3 12.0 - 15.0 g/dL   HCT 36.2 36.0 - 46.0 %   MCV 88.7 78.0 - 100.0 fL   MCH 30.1 26.0 - 34.0 pg   MCHC 34.0 30.0 - 36.0 g/dL   RDW 13.1 11.5 - 15.5 %   Platelets 19 (LL) 150 - 400 K/uL    Comment: REPEATED TO VERIFY CONSISTENT WITH PREVIOUS RESULT CRITICAL VALUE NOTED.  VALUE IS CONSISTENT WITH PREVIOUSLY REPORTED AND CALLED VALUE.     Imaging / Studies: Dg Abd 1 View  06/26/2014   CLINICAL DATA:  Gastric outlet obstruction.  EXAM: ABDOMEN - 1 VIEW  COMPARISON:  06/25/2014  FINDINGS: There is an NG tube in the stomach. Minimal residual contrast is noted in the stomach. The bowel gas pattern is unremarkable and stable. No obvious free air. Persistent subcutaneous emphysema is noted.  IMPRESSION: Stable NG tube.  Minimal residual contrast in the stomach.  Persistent subcutaneous emphysema.   Electronically Signed   By: Marijo Sanes M.D.   On: 06/26/2014 07:32   Dg Abd 1 View  06/25/2014   CLINICAL DATA:  59 year old female with a history of gastric outlet obstruction, post upper GI 3 hour film.  EXAM: ABDOMEN - 1 VIEW  COMPARISON:  06/25/2014  FINDINGS: Single frontal view of the abdomen  completed as a delay from upper GI study.  Contrast remains within the lumen of the stomach with no oral contrast traversing the pylorus and no contrast within the small bowel or colon.  Relative paucity of central small bowel gas and colonic gas.  No unexpected calcifications.  No displaced fracture identified.  There is lucency tracking along the left lateral wall musculature, which may represent subcutaneous/myofacial gas.  IMPRESSION: Delayed image from upper GI study demonstrates no progression of oral contrast beyond the pylorus of the stomach. Findings may represent gastric paresis. Alternatively, if there is concern for acute gastric outlet obstruction, CT may be indicated.  Lucency along left lateral wall musculature suggesting subcutaneous or myofacial gas. This should be amenable to direct inspection, and again if there is concern for acute process, CT may be indicated.  Signed,  Dulcy Fanny. Earleen Newport, DO  [facility]  [facility]   Electronically Signed   By: Corrie Mckusick D.O.   On: 06/25/2014 14:20   Dg Abd Portable 1v  06/25/2014   CLINICAL DATA:  Status post upper GI.  1 hour image.  EXAM: PORTABLE ABDOMEN - 1 VIEW  COMPARISON:  06/25/2014 at 9:22 a.m.  FINDINGS: Contrast remains within the stomach. No contrast is seen within the small bowel. There is no bowel dilation to suggest obstruction. Nasogastric tube is stable in well positioned within the stomach.  IMPRESSION: Contrast remains within the stomach with no contrast entering small bowel. This could be due to gastroparesis. Obstruction at the level of the gastric anastomosis is possible. Please refer to the upper GI  report. Consider followup radiographs in 2-4 hours to reassess for gastric emptying.   Electronically Signed   By: Lajean Manes M.D.   On: 06/25/2014 11:28   Dg Duanne Limerick W/water Sol Cm  06/25/2014   CLINICAL DATA:  59 year old female status post partial gastrectomy with gastroenteric  anastomosis and hiatal hernia repair. Evaluate for postoperative leak and patency of the surgical anastomosis.  EXAM: UPPER GI SERIES WITH KUB  TECHNIQUE: After obtaining a scout radiograph a routine upper GI series was performed using water-soluble contrast.  FLUOROSCOPY TIME:  If the device does not provide the exposure index:  Fluoroscopy Time (in minutes and seconds):  2 minutes 28 seconds  Number of Acquired Images:  3  COMPARISON:  Preoperative upper GI 05/10/2014  FINDINGS: Initial KUB images are unremarkable. A nasogastric tube is well positioned with the tip in the gastric body. Following administration of approximately 100 mL Omnipaque 350 and 250 mL water the gastric lumen was well opacified. The contrast material collected initially within the dependent gastric fundus. Therefore, the patient was moved into multiple different positions effectively coating the gastric fundus and gastric body. However, despite dependent positioning, contrast material did not pass into the proximal small bowel. Additionally, no peristalsis or movement of the stomach was visualized during the course of the examination. No extravasation of contrast.  IMPRESSION: 1. Gastroparesis. Unable to visualize contrast material passing through the surgical anastomosis and into proximal small bowel. 2. No evidence of leak during the course of the examination. 3. Well-positioned nasogastric tube. Will continue evaluation with follow-up abdominal radiographs to document distal progression of contrast. These results were called by telephone at the time of interpretation on 06/25/2014 at 10:42 am to Dr. Michael Boston , who verbally acknowledged these results.   Electronically Signed   By: Jacqulynn Cadet M.D.   On: 06/25/2014 10:42    Medications / Allergies: per chart  Antibiotics: Anti-infectives    Start     Dose/Rate Route Frequency Ordered Stop   06/24/14 2200  cefoTEtan (CEFOTAN) 2 g in dextrose 5 % 50 mL IVPB     2 g 100  mL/hr over 30 Minutes Intravenous Every 12 hours 06/24/14 1900 06/24/14 2201   06/24/14 0908  cefoTEtan in Dextrose 5% (CEFOTAN) IVPB 2 g    Comments:  Pharmacy may adjust dose strength for optimal dosing.   Send with patient on call to the OR.  Anesthesia to complete antibiotic administration <45min prior to incision per [facility].   2 g Intravenous On call to O.R. 06/24/14 0907 06/24/14 1228       Note: Portions of this report may have been transcribed using voice recognition software. Every effort was made to ensure accuracy; however, inadvertent computerized transcription errors may be present.   Any transcriptional errors that result from this process are unintentional.     [name], M.D., F.A.C.S. Gastrointestinal and Minimally Invasive Surgery [facility] [address], [place], [place] [address] [phone] Main / Paging   06/26/2014

## 2014-06-26 NOTE — Progress Notes (Signed)
CRITICAL VALUE ALERT  Critical value received:  Platelets 18  Date of notification:  06/26/14  Time of notification:  0720  Critical value read back:Yes.    Nurse who received alert:  Hart Carwin, RN  MD notified (1st page):  CCS  Time of first page:  0730  MD notified (2nd page):  Time of second page:  Responding MD:  Dr. Johney Maine  Time MD responded:  [phone]

## 2014-06-26 NOTE — Plan of Care (Signed)
Problem: Food- and Nutrition-Related Knowledge Deficit (NB-1.1) Goal: Nutrition education Formal process to instruct or train a patient/client in a skill or to impart knowledge to help patients/clients voluntarily manage or modify food choices and eating behavior to maintain or improve health. Outcome: Completed/Met Date Met:  06/26/14  RD consulted for education regarding patient s/p partial gastrectomy.  RD provided "Gastric Surgery Nutrition Therapy" handout from the [employer]. Encouraged small, frequent meals. Encouraged adequate protein consumption. Provided list of protein shakes she can try at home. Discouraged foods with high amounts of sugar and fiber. Teach back method used.  Expect good compliance with family present in room during education.  Body mass index is 33.39 kg/(m^2). Pt meets criteria for obesity based on current BMI.  Current diet order is clear liquids, patient states she is tolerating liquids at this time. Labs and medications reviewed. No further nutrition interventions warranted at this time. RD contact information provided. If additional nutrition issues arise, please re-consult RD.   Clayton Bibles, MS, RD, LDN Pager: [phone] After Hours Pager: [phone]

## 2014-06-27 ENCOUNTER — Encounter (HOSPITAL_COMMUNITY): Payer: Self-pay | Admitting: Surgery

## 2014-06-27 LAB — CBC
HCT: 36.4 % (ref 36.0–46.0)
HEMOGLOBIN: 12.4 g/dL (ref 12.0–15.0)
MCH: 30.3 pg (ref 26.0–34.0)
MCHC: 34.1 g/dL (ref 30.0–36.0)
MCV: 89 fL (ref 78.0–100.0)
Platelets: 30 10*3/uL — ABNORMAL LOW (ref 150–400)
RBC: 4.09 MIL/uL (ref 3.87–5.11)
RDW: 13.2 % (ref 11.5–15.5)
WBC: 12.7 10*3/uL — ABNORMAL HIGH (ref 4.0–10.5)

## 2014-06-27 LAB — BASIC METABOLIC PANEL
ANION GAP: 8 (ref 5–15)
BUN: 14 mg/dL (ref 6–23)
CO2: 28 mmol/L (ref 19–32)
Calcium: 8.4 mg/dL (ref 8.4–10.5)
Chloride: 97 mmol/L (ref 96–112)
Creatinine, Ser: 0.63 mg/dL (ref 0.50–1.10)
GFR calc Af Amer: >90 mL/min (ref >90)
Glucose, Bld: 127 mg/dL — ABNORMAL HIGH (ref 70–99)
POTASSIUM: 3.8 mmol/L (ref 3.5–5.1)
SODIUM: 133 mmol/L — AB (ref 135–145)

## 2014-06-27 LAB — MAGNESIUM: MAGNESIUM: 2.3 mg/dL (ref 1.5–2.5)

## 2014-06-27 LAB — HEPARIN INDUCED THROMBOCYTOPENIA PNL: Heparin Induced Plt Ab: 0.189 OD (ref 0.000–0.400)

## 2014-06-27 MED ORDER — FLUOXETINE HCL 20 MG PO CAPS
40.0000 mg | ORAL_CAPSULE | Freq: Every day | ORAL | Status: DC
  Administered 2014-06-27 – 2014-06-28 (×2): 40 mg via ORAL
  Filled 2014-06-27 (×2): qty 2

## 2014-06-27 MED ORDER — UNJURY VANILLA POWDER: 2.0000 [oz_av] | Freq: Four times a day (QID) | ORAL | Status: DC

## 2014-06-27 MED ORDER — UNJURY CHOCOLATE CLASSIC POWDER: 2.0000 [oz_av] | Freq: Four times a day (QID) | ORAL | Status: DC

## 2014-06-27 MED ORDER — PANTOPRAZOLE SODIUM 40 MG PO TBEC
80.0000 mg | DELAYED_RELEASE_TABLET | Freq: Every day | ORAL | Status: DC
  Administered 2014-06-27: 80 mg via ORAL
  Filled 2014-06-27 (×2): qty 2

## 2014-06-27 MED ORDER — ACETAMINOPHEN 160 MG/5ML PO SOLN: 650.0000 mg | ORAL | Status: DC | PRN

## 2014-06-27 MED ORDER — ALPRAZOLAM 0.25 MG PO TABS
0.2500 mg | ORAL_TABLET | Freq: Two times a day (BID) | ORAL | Status: DC | PRN
  Administered 2014-06-27: 0.25 mg via ORAL
  Filled 2014-06-27: qty 1

## 2014-06-27 MED ORDER — ATORVASTATIN CALCIUM 20 MG PO TABS
20.0000 mg | ORAL_TABLET | Freq: Every day | ORAL | Status: DC
  Administered 2014-06-27: 20 mg via ORAL
  Filled 2014-06-27 (×2): qty 1

## 2014-06-27 MED ORDER — UNJURY CHICKEN SOUP POWDER: 2.0000 [oz_av] | Freq: Four times a day (QID) | ORAL | Status: DC

## 2014-06-27 NOTE — Progress Notes (Signed)
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 Active Problems:   Anxiety disorder   GERD   Obesity   Thrombocytopenia   3 Days Post-Op  Procedure(s): PRE-OPERATIVE DIAGNOSIS: Partial Gastric Outlet Obstruction  POST-OPERATIVE DIAGNOSIS: Near complete Gastric Outlet Obstruction  PROCEDURE:  ROBOTIC TRUNCAL VAGOTOMY  ROBOTIC DISTAL GASTRECTOMY ROBOTIC ROUX-EN-Y CONSTRUCTION ROBOTIC HIATAL HERNIA REPAIR Omentopexy of duodenum Omentopexy of Gastrojejunostomy  SURGEON: Surgeon(s): Michael Boston, MD   Assessment  Better  Plan:  -NGT d/c'd by me   -adv diet gradually - treat like a gastric sleeve & keep volumes low at first  -thrombocytopenia of uncertain etiology - 4T score low (2 out of 8) makes heparin etiology / HIT unlikely but did check HIT panel.  Presented 10 hours after 1st heparin dose & usually 5-10 days for HIT.  Suspect Cefotetan periop since no other obvious medication culprits.  Plat ct improved w no active bleeding.  Should resolve.  Hold off on transfusion unless bleeding with drop in Hgb and/or plat count <10K.  D/w Dr Tat with Internal Medicine.  Non-heparin anticoagulation & Hematology eval if falls more.     -PRN IVF bolus to avoid overdoing it  -anxiolysis  -antidepressant  -PPI  -VTE prophylaxis- SCDs, etc  -mobilize as tolerated to help  recovery  I updated the status of the patient to the patient.  OR findings & procedure discussed again.   I made recommendations.  I answered questions.  Understanding & appreciation was expressed.    [name], M.D., F.A.C.S. Gastrointestinal and Minimally Invasive Surgery [facility] [address] [place], [place] [address] [phone] Main / Paging   06/27/2014  Subjective:  Sore nose/throat Walking well Pain minimal & controlled  Objective:  Vital signs:  Filed Vitals:   06/26/14 0956 06/26/14 1348 06/26/14 2100 06/27/14 0541  BP: 140/66 131/62 119/65 129/61  Pulse: 83 90 92 90  Temp: 97.8 F (36.6 C) 98.4 F (36.9 C) 98.4 F (36.9 C) 98.1 F (36.7 C)  TempSrc: Oral Oral Oral Oral  Resp: $Remo'18 18 18 18  'dcjBD$ Height:      Weight:      SpO2: 92% 92% 90% 92%       Intake/Output   Yesterday:  04/17 0701 - 04/18 0700 In: 303 [P.O.:240; I.V.:3] Out: 1600 [Urine:1425; Emesis/NG output:175] This shift:     Bowel function:  Flatus: scant  BM: n  Drain: thin min clear  Physical Exam:  General: Pt awake/alert/oriented x4 in no acute distress Eyes: PERRL, normal EOM.  Sclera clear.  No icterus Neuro: CN II-XII intact w/o focal sensory/motor deficits. Lymph: No head/neck/groin lymphadenopathy Psych:  No delerium/psychosis/paranoia HENT: Normocephalic, Mucus membranes moist.  No thrush.  NGT in place Neck: Supple, No tracheal deviation Chest: No chest wall pain w good excursion CV:  Pulses intact.  Regular rhythm MS: Normal AROM mjr joints.  No obvious deformity Abdomen: Soft.  Nondistended.  Minimally tender at incisions only.  No evidence of peritonitis.  No incarcerated hernias. Ext:  SCDs BLE.  No mjr edema.  No cyanosis Skin: No petechiae / purpura.  No red/pink/purple discoloration in extremities  Results:   Labs: Results for orders placed or performed during the hospital encounter of 06/24/14 (from the past 48  hour(s))  Hemoglobin and hematocrit, blood     Status: None   Collection Time: 06/25/14  4:22 PM  Result Value Ref Range   Hemoglobin 12.4 12.0 - 15.0 g/dL   HCT 37.0 36.0 - 46.0 %  CBC with Differential     Status: Abnormal   Collection Time: 06/26/14  5:13 AM  Result Value Ref Range   WBC 10.5 4.0 - 10.5 K/uL   RBC 4.05 3.87 - 5.11 MIL/uL   Hemoglobin 12.2 12.0 - 15.0 g/dL   HCT 36.1 36.0 - 46.0 %   MCV 89.1 78.0 - 100.0 fL   MCH 30.1 26.0 - 34.0 pg   MCHC 33.8 30.0 - 36.0 g/dL   RDW 13.1 11.5 - 15.5 %   Platelets 18 (LL) 150 - 400 K/uL    Comment: DELTA CHECK NOTED REPEATED TO VERIFY PLATELET COUNT CONFIRMED BY SMEAR CRITICAL RESULT CALLED TO, READ BACK BY AND VERIFIED WITH: AMANDA LEMONS,RN [id number] @ 0719 BY J SCOTTON    Neutrophils Relative % 77 43 - 77 %   Lymphocytes Relative 12 12 - 46 %   Monocytes Relative 11 3 - 12 %   Eosinophils Relative 0 0 - 5 %   Basophils Relative 0 0 - 1 %   Neutro Abs 8.0 (H) 1.7 - 7.7 K/uL   Lymphs Abs 1.3 0.7 - 4.0 K/uL   Monocytes Absolute 1.2 (H) 0.1 - 1.0 K/uL   Eosinophils Absolute 0.0 0.0 - 0.7 K/uL   Basophils Absolute 0.0 0.0 - 0.1 K/uL   Smear Review PLATELET COUNT CONFIRMED BY SMEAR   CBC     Status: Abnormal   Collection Time: 06/26/14  8:08 AM  Result Value Ref Range   WBC 10.7 (H) 4.0 - 10.5 K/uL   RBC 4.08 3.87 - 5.11 MIL/uL   Hemoglobin 12.3 12.0 - 15.0 g/dL   HCT 36.2 36.0 - 46.0 %   MCV 88.7 78.0 - 100.0 fL   MCH 30.1 26.0 - 34.0 pg   MCHC 34.0 30.0 - 36.0 g/dL   RDW 13.1 11.5 - 15.5 %   Platelets 19 (LL) 150 - 400 K/uL    Comment: REPEATED TO VERIFY CONSISTENT WITH PREVIOUS RESULT CRITICAL VALUE NOTED.  VALUE IS CONSISTENT WITH PREVIOUSLY REPORTED AND CALLED VALUE.   CBC     Status: Abnormal   Collection Time: 06/27/14  5:24 AM  Result Value Ref Range   WBC 12.7 (H) 4.0 - 10.5 K/uL   RBC 4.09 3.87 - 5.11 MIL/uL   Hemoglobin 12.4 12.0 - 15.0 g/dL   HCT 36.4 36.0 - 46.0 %   MCV 89.0 78.0 - 100.0 fL   MCH  30.3 26.0 - 34.0 pg   MCHC 34.1 30.0 - 36.0 g/dL   RDW 13.2 11.5 - 15.5 %   Platelets 30 (L) 150 - 400 K/uL    Comment: REPEATED TO VERIFY DELTA CHECK NOTED RESULT CHECKED   Basic metabolic panel     Status: Abnormal   Collection Time: 06/27/14  5:24 AM  Result Value Ref Range   Sodium 133 (L) 135 - 145 mmol/L   Potassium 3.8 3.5 - 5.1 mmol/L   Chloride 97 96 -  112 mmol/L   CO2 28 19 - 32 mmol/L   Glucose, Bld 127 (H) 70 - 99 mg/dL   BUN 14 6 - 23 mg/dL   Creatinine, Ser 0.63 0.50 - 1.10 mg/dL   Calcium 8.4 8.4 - 10.5 mg/dL   GFR calc non Af Amer >90 >90 mL/min   GFR calc Af Amer >90 >90 mL/min    Comment: (NOTE) The eGFR has been calculated using the CKD EPI equation. This calculation has not been validated in all clinical situations. eGFR's persistently <90 mL/min signify possible Chronic Kidney Disease.    Anion gap 8 5 - 15  Magnesium     Status: None   Collection Time: 06/27/14  5:24 AM  Result Value Ref Range   Magnesium 2.3 1.5 - 2.5 mg/dL    Imaging / Studies: Dg Abd 1 View  06/26/2014   CLINICAL DATA:  Gastric outlet obstruction.  EXAM: ABDOMEN - 1 VIEW  COMPARISON:  06/25/2014  FINDINGS: There is an NG tube in the stomach. Minimal residual contrast is noted in the stomach. The bowel gas pattern is unremarkable and stable. No obvious free air. Persistent subcutaneous emphysema is noted.  IMPRESSION: Stable NG tube.  Minimal residual contrast in the stomach.  Persistent subcutaneous emphysema.   Electronically Signed   By: Marijo Sanes M.D.   On: 06/26/2014 07:32   Dg Abd 1 View  06/25/2014   CLINICAL DATA:  59 year old female with a history of gastric outlet obstruction, post upper GI 3 hour film.  EXAM: ABDOMEN - 1 VIEW  COMPARISON:  06/25/2014  FINDINGS: Single frontal view of the abdomen completed as a delay from upper GI study.  Contrast remains within the lumen of the stomach with no oral contrast traversing the pylorus and no contrast within the small bowel or  colon.  Relative paucity of central small bowel gas and colonic gas.  No unexpected calcifications.  No displaced fracture identified.  There is lucency tracking along the left lateral wall musculature, which may represent subcutaneous/myofacial gas.  IMPRESSION: Delayed image from upper GI study demonstrates no progression of oral contrast beyond the pylorus of the stomach. Findings may represent gastric paresis. Alternatively, if there is concern for acute gastric outlet obstruction, CT may be indicated.  Lucency along left lateral wall musculature suggesting subcutaneous or myofacial gas. This should be amenable to direct inspection, and again if there is concern for acute process, CT may be indicated.  Signed,  Dulcy Fanny. Earleen Newport, DO  [facility]  [facility]   Electronically Signed   By: Corrie Mckusick D.O.   On: 06/25/2014 14:20   Dg Abd Portable 1v  06/25/2014   CLINICAL DATA:  Status post upper GI.  1 hour image.  EXAM: PORTABLE ABDOMEN - 1 VIEW  COMPARISON:  06/25/2014 at 9:22 a.m.  FINDINGS: Contrast remains within the stomach. No contrast is seen within the small bowel. There is no bowel dilation to suggest obstruction. Nasogastric tube is stable in well positioned within the stomach.  IMPRESSION: Contrast remains within the stomach with no contrast entering small bowel. This could be due to gastroparesis. Obstruction at the level of the gastric anastomosis is possible. Please refer to the upper GI report. Consider followup radiographs in 2-4 hours to reassess for gastric emptying.   Electronically Signed   By: Lajean Manes M.D.   On: 06/25/2014 11:28   Dg Duanne Limerick W/water Sol Cm  06/25/2014   CLINICAL DATA:  59 year old female status  post partial gastrectomy with gastroenteric anastomosis and hiatal hernia repair. Evaluate for postoperative leak and patency of the surgical anastomosis.  EXAM: UPPER GI SERIES WITH KUB  TECHNIQUE: After obtaining a scout  radiograph a routine upper GI series was performed using water-soluble contrast.  FLUOROSCOPY TIME:  If the device does not provide the exposure index:  Fluoroscopy Time (in minutes and seconds):  2 minutes 28 seconds  Number of Acquired Images:  3  COMPARISON:  Preoperative upper GI 05/10/2014  FINDINGS: Initial KUB images are unremarkable. A nasogastric tube is well positioned with the tip in the gastric body. Following administration of approximately 100 mL Omnipaque 350 and 250 mL water the gastric lumen was well opacified. The contrast material collected initially within the dependent gastric fundus. Therefore, the patient was moved into multiple different positions effectively coating the gastric fundus and gastric body. However, despite dependent positioning, contrast material did not pass into the proximal small bowel. Additionally, no peristalsis or movement of the stomach was visualized during the course of the examination. No extravasation of contrast.  IMPRESSION: 1. Gastroparesis. Unable to visualize contrast material passing through the surgical anastomosis and into proximal small bowel. 2. No evidence of leak during the course of the examination. 3. Well-positioned nasogastric tube. Will continue evaluation with follow-up abdominal radiographs to document distal progression of contrast. These results were called by telephone at the time of interpretation on 06/25/2014 at 10:42 am to Dr. Michael Boston , who verbally acknowledged these results.   Electronically Signed   By: Jacqulynn Cadet M.D.   On: 06/25/2014 10:42    Medications / Allergies: per chart  Antibiotics: Anti-infectives    Start     Dose/Rate Route Frequency Ordered Stop   06/24/14 2200  cefoTEtan (CEFOTAN) 2 g in dextrose 5 % 50 mL IVPB     2 g 100 mL/hr over 30 Minutes Intravenous Every 12 hours 06/24/14 1900 06/24/14 2201   06/24/14 0908  cefoTEtan in Dextrose 5% (CEFOTAN) IVPB 2 g    Comments:  Pharmacy may adjust dose  strength for optimal dosing.   Send with patient on call to the OR.  Anesthesia to complete antibiotic administration <23min prior to incision per [facility].   2 g Intravenous On call to O.R. 06/24/14 0907 06/24/14 1228       Note: Portions of this report may have been transcribed using voice recognition software. Every effort was made to ensure accuracy; however, inadvertent computerized transcription errors may be present.   Any transcriptional errors that result from this process are unintentional.     [name], M.D., F.A.C.S. Gastrointestinal and Minimally Invasive Surgery [facility] [address] [place], [place] [address] [phone] Main / Paging   06/27/2014

## 2014-06-28 LAB — CBC
HCT: 34.5 % — ABNORMAL LOW (ref 36.0–46.0)
HEMOGLOBIN: 11.8 g/dL — AB (ref 12.0–15.0)
MCH: 30.2 pg (ref 26.0–34.0)
MCHC: 34.2 g/dL (ref 30.0–36.0)
MCV: 88.2 fL (ref 78.0–100.0)
Platelets: 62 10*3/uL — ABNORMAL LOW (ref 150–400)
RBC: 3.91 MIL/uL (ref 3.87–5.11)
RDW: 13 % (ref 11.5–15.5)
WBC: 11.2 10*3/uL — AB (ref 4.0–10.5)

## 2014-06-28 NOTE — Progress Notes (Signed)
Assessment unchanged. Pt verbalized understanding of dc instructions through teach back including follow up care, medications to resume, pain control and diet. Pt understands to follow pureed/blenderized diet for next 2 weeks until seeing Dr. Johney Maine. Educational materials provided. Discharged via wc to front entrance to meet awaiting vehicle to carry home. Accompanied by husband and Diplomatic Services operational officer.

## 2014-06-28 NOTE — Plan of Care (Signed)
Problem: Phase III Progression Outcomes Goal: Nasogastric tube discontinued Outcome: Completed/Met Date Met:  06/28/14 NGT discontinued Monday.

## 2014-06-28 NOTE — Discharge Summary (Signed)
Physician Discharge Summary  Patient ID: Shelly Hood MRN: [id number] DOB/AGE: 1955/12/29 59 y.o.  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 Vagus nerve - NERVE TISSUE AND TWO FRAGMENTS OF BENIGN LYMPHOID TISSUE. 2. Stomach, resection, antrum, duodenal bulb - GASTRIC BODY-TYPE MUCOSA WITH ASSOCIATED MILD CHRONIC INFLAMMATION. - NO EVIDENCE OF HELICOBACTER PYLORI, INTESTINAL METAPLASIA, DYSPLASIA, OR MALIGNANCY. - RESECTIONS MARGINS, NEGATIVE FOR ATYPIA OR MALIGNANCY. Shelly Bar MD Pathologist, Electronic Signature (Case signed 06/27/2014) Specimen Shelly Hood and Clinical Information Specimen(s) Obtained: 1. Vagus nerve 2. Stomach, resection, antrum, duodenal bulb Specimen Clinical Information 1. Partial gastric outlet obstruction (isv) Shelly Hood 1. Received in formalin and consists of a 3.2 x 2.0 x 0.6 cm aggregate of tan brown, fibrous soft tissue. Representative sections are submitted in one  cassette. 2. Received in formalin and consists of a 10.5 cm in length portion of tan pink pink soft tissue with two stapled resection margins. One end of the specimen measures 6.1 cm in diameter and the opposing end measures 2.5 cm. There is attached tan yellow adipose tissue on the edges of the specimen. The serosa is tan pink, smooth, and focally hyperemic. The larger end is grossly designated as proximal and the smaller margin is grossly designated distal. The lumen contains a small amount of tan red gelatinous material. The proximal mucosa is tan red, hyperemic with normal gastric folding. The distal mucosa is tan red, hyperemic, with normal intestinal folding, with a small amount of tan white mucinous material is identified at the distal end of the specimen. The wall ranges from 0.3 to 0.7 cm in 1 of 2 FINAL for Shelly Hood, Shelly Hood [phone]) Shelly Hood(continued) thickness. Representative sections are submitted in six cassettes. A,B= proximal resection margin. C= distal resection margin. D,E= sections of gastric outlet. F= representative sections of mucosa. (KL:gt, 06/26/14) Report signed out from the following location(s) Technical component and interpretation was performed at [facility], [place], [place] [address]. CLIA #: [id number], 2 of  Hospital Course:   The patient underwent the surgery above.  POD#1 UGI showed no emptying at first but no leak.  Had low NGT output.   Tolerated clamping trial. Postoperatively, the patient gradually mobilized and advanced to a pureed diet.  Pain and other symptoms were treated aggressively.  Platlet count dropped to 18K but gradually increased to 60K in 2 days.  HIT ABx panel normal = not c/w HIT.  Most likely from Cefotetan B lactam 1 dose Abx preop.  By the time of discharge, the patient was walking well the hallways, eating pureed food, having flatus.  She had BM.  Pain was minimal & well-controlled on an oral  medications.  Based on meeting discharge criteria and continuing to recover, I felt it was safe for the patient to be discharged from the hospital to further recover with close followup. Postoperative recommendations were discussed in detail with patient & her RN Shelly Hood.  They are written as well.   Significant Diagnostic Studies:  Results for orders placed or performed during the hospital encounter of 06/24/14 (from the past 72 hour(s))  Hemoglobin and hematocrit, blood     Status: None   Collection Time: 06/25/14  4:22 PM  Result Value Ref Range   Hemoglobin 12.4 12.0 - 15.0 g/dL   HCT 37.0 36.0 - 46.0 %  CBC with Differential     Status: Abnormal   Collection Time: 06/26/14  5:13 AM  Result Value Ref Range   WBC 10.5 4.0 - 10.5 K/uL   RBC 4.05 3.87 - 5.11 MIL/uL   Hemoglobin 12.2 12.0 - 15.0 g/dL   HCT 36.1 36.0 - 46.0 %   MCV 89.1 78.0 - 100.0 fL   MCH 30.1 26.0 - 34.0 pg   MCHC 33.8 30.0 - 36.0 g/dL   RDW 13.1 11.5 - 15.5 %   Platelets 18 (LL) 150 - 400 K/uL    Comment: DELTA CHECK NOTED REPEATED TO VERIFY PLATELET COUNT CONFIRMED BY SMEAR CRITICAL RESULT CALLED TO, READ BACK BY AND VERIFIED WITH: AMANDA LEMONS,RN [id number] @ 0719 BY J SCOTTON    Neutrophils Relative % 77 43 - 77 %   Lymphocytes Relative 12 12 - 46 %   Monocytes Relative 11 3 - 12 %   Eosinophils Relative 0 0 - 5 %   Basophils Relative 0 0 - 1 %   Neutro Abs 8.0 (H) 1.7 - 7.7 K/uL   Lymphs Abs 1.3 0.7 - 4.0 K/uL   Monocytes Absolute 1.2 (H) 0.1 - 1.0 K/uL   Eosinophils Absolute 0.0 0.0 - 0.7 K/uL   Basophils Absolute 0.0 0.0 - 0.1 K/uL   Smear Review PLATELET COUNT CONFIRMED BY SMEAR   CBC     Status: Abnormal   Collection Time: 06/26/14  8:08 AM  Result Value Ref Range   WBC 10.7 (H) 4.0 - 10.5 K/uL   RBC 4.08 3.87 - 5.11 MIL/uL   Hemoglobin 12.3 12.0 - 15.0 g/dL   HCT 36.2 36.0 - 46.0 %   MCV 88.7 78.0 - 100.0 fL   MCH 30.1 26.0 - 34.0 pg   MCHC 34.0 30.0 - 36.0 g/dL   RDW 13.1 11.5 - 15.5 %    Platelets 19 (LL) 150 - 400 K/uL    Comment: REPEATED TO VERIFY CONSISTENT WITH PREVIOUS RESULT CRITICAL VALUE NOTED.  VALUE IS CONSISTENT WITH PREVIOUSLY REPORTED AND CALLED VALUE.   Heparin induced thrombocytopenia pnl     Status: None   Collection Time: 06/26/14 11:59 AM  Result Value Ref Range   Heparin Induced Plt Ab 0.189 0.000 - 0.400 OD    Comment: (NOTE) Performed At: [facility] [place], [place] [id number] Lindon Romp MD PV:[id number]   CBC     Status: Abnormal   Collection Time: 06/27/14  5:24 AM  Result Value Ref Range   WBC 12.7 (H) 4.0 - 10.5 K/uL   RBC 4.09 3.87 - 5.11 MIL/uL   Hemoglobin 12.4 12.0 - 15.0 g/dL   HCT 36.4 36.0 - 46.0 %   MCV 89.0 78.0 - 100.0 fL   MCH 30.3 26.0 - 34.0 pg   MCHC 34.1 30.0 - 36.0 g/dL  RDW 13.2 11.5 - 15.5 %   Platelets 30 (L) 150 - 400 K/uL    Comment: REPEATED TO VERIFY DELTA CHECK NOTED RESULT CHECKED   Basic metabolic panel     Status: Abnormal   Collection Time: 06/27/14  5:24 AM  Result Value Ref Range   Sodium 133 (L) 135 - 145 mmol/L   Potassium 3.8 3.5 - 5.1 mmol/L   Chloride 97 96 - 112 mmol/L   CO2 28 19 - 32 mmol/L   Glucose, Bld 127 (H) 70 - 99 mg/dL   BUN 14 6 - 23 mg/dL   Creatinine, Ser 0.63 0.50 - 1.10 mg/dL   Calcium 8.4 8.4 - 10.5 mg/dL   GFR calc non Af Amer >90 >90 mL/min   GFR calc Af Amer >90 >90 mL/min    Comment: (NOTE) The eGFR has been calculated using the CKD EPI equation. This calculation has not been validated in all clinical situations. eGFR's persistently <90 mL/min signify possible Chronic Kidney Disease.    Anion gap 8 5 - 15  Magnesium     Status: None   Collection Time: 06/27/14  5:24 AM  Result Value Ref Range   Magnesium 2.3 1.5 - 2.5 mg/dL  CBC     Status: Abnormal   Collection Time: 06/28/14  5:20 AM  Result Value Ref Range   WBC 11.2 (H) 4.0 - 10.5 K/uL   RBC 3.91 3.87 - 5.11 MIL/uL   Hemoglobin 11.8 (L) 12.0 - 15.0 g/dL   HCT 34.5 (L) 36.0 -  46.0 %   MCV 88.2 78.0 - 100.0 fL   MCH 30.2 26.0 - 34.0 pg   MCHC 34.2 30.0 - 36.0 g/dL   RDW 13.0 11.5 - 15.5 %   Platelets 62 (L) 150 - 400 K/uL    Comment: REPEATED TO VERIFY DELTA CHECK NOTED     Dg Abd 1 View  06/26/2014   CLINICAL DATA:  Gastric outlet obstruction.  EXAM: ABDOMEN - 1 VIEW  COMPARISON:  06/25/2014  FINDINGS: There is an NG tube in the stomach. Minimal residual contrast is noted in the stomach. The bowel gas pattern is unremarkable and stable. No obvious free air. Persistent subcutaneous emphysema is noted.  IMPRESSION: Stable NG tube.  Minimal residual contrast in the stomach.  Persistent subcutaneous emphysema.   Electronically Signed   By: Marijo Sanes M.D.   On: 06/26/2014 07:32   Dg Abd 1 View  06/25/2014   CLINICAL DATA:  59 year old female with a history of gastric outlet obstruction, post upper GI 3 hour film.  EXAM: ABDOMEN - 1 VIEW  COMPARISON:  06/25/2014  FINDINGS: Single frontal view of the abdomen completed as a delay from upper GI study.  Contrast remains within the lumen of the stomach with no oral contrast traversing the pylorus and no contrast within the small bowel or colon.  Relative paucity of central small bowel gas and colonic gas.  No unexpected calcifications.  No displaced fracture identified.  There is lucency tracking along the left lateral wall musculature, which may represent subcutaneous/myofacial gas.  IMPRESSION: Delayed image from upper GI study demonstrates no progression of oral contrast beyond the pylorus of the stomach. Findings may represent gastric paresis. Alternatively, if there is concern for acute gastric outlet obstruction, CT may be indicated.  Lucency along left lateral wall musculature suggesting subcutaneous or myofacial gas. This should be amenable to direct inspection, and again if there is concern for acute process, CT may be indicated.  Signed,  Dulcy Fanny. Earleen Newport, DO  [facility]  [facility]  Radiology   Electronically Signed   By: Corrie Mckusick D.O.   On: 06/25/2014 14:20   Dg Abd Portable 1v  06/25/2014   CLINICAL DATA:  Status post upper GI.  1 hour image.  EXAM: PORTABLE ABDOMEN - 1 VIEW  COMPARISON:  06/25/2014 at 9:22 a.m.  FINDINGS: Contrast remains within the stomach. No contrast is seen within the small bowel. There is no bowel dilation to suggest obstruction. Nasogastric tube is stable in well positioned within the stomach.  IMPRESSION: Contrast remains within the stomach with no contrast entering small bowel. This could be due to gastroparesis. Obstruction at the level of the gastric anastomosis is possible. Please refer to the upper GI report. Consider followup radiographs in 2-4 hours to reassess for gastric emptying.   Electronically Signed   By: Lajean Manes M.D.   On: 06/25/2014 11:28   Dg Duanne Limerick W/water Sol Cm  06/25/2014   CLINICAL DATA:  59 year old female status post partial gastrectomy with gastroenteric anastomosis and hiatal hernia repair. Evaluate for postoperative leak and patency of the surgical anastomosis.  EXAM: UPPER GI SERIES WITH KUB  TECHNIQUE: After obtaining a scout radiograph a routine upper GI series was performed using water-soluble contrast.  FLUOROSCOPY TIME:  If the device does not provide the exposure index:  Fluoroscopy Time (in minutes and seconds):  2 minutes 28 seconds  Number of Acquired Images:  3  COMPARISON:  Preoperative upper GI 05/10/2014  FINDINGS: Initial KUB images are unremarkable. A nasogastric tube is well positioned with the tip in the gastric body. Following administration of approximately 100 mL Omnipaque 350 and 250 mL water the gastric lumen was well opacified. The contrast material collected initially within the dependent gastric fundus. Therefore, the patient was moved into multiple different positions effectively coating the gastric fundus and gastric body. However, despite dependent positioning, contrast material did not pass into  the proximal small bowel. Additionally, no peristalsis or movement of the stomach was visualized during the course of the examination. No extravasation of contrast.  IMPRESSION: 1. Gastroparesis. Unable to visualize contrast material passing through the surgical anastomosis and into proximal small bowel. 2. No evidence of leak during the course of the examination. 3. Well-positioned nasogastric tube. Will continue evaluation with follow-up abdominal radiographs to document distal progression of contrast. These results were called by telephone at the time of interpretation on 06/25/2014 at 10:42 am to Dr. Michael Boston , who verbally acknowledged these results.   Electronically Signed   By: Jacqulynn Cadet M.D.   On: 06/25/2014 10:42    Discharge Exam: Blood pressure 127/59, pulse 86, temperature 99.3 F (37.4 C), temperature source Oral, resp. rate 18, height $RemoveBe'4\' 11"'OrpmWXnNh$  (1.499 m), weight 75.025 kg (165 lb 6.4 oz), SpO2 94 %.  General: Pt awake/alert/oriented x4 in no major acute distress.  Smiling Eyes: PERRL, normal EOM. Sclera nonicteric Neuro: CN II-XII intact w/o focal sensory/motor deficits. Lymph: No head/neck/groin lymphadenopathy Psych:  No delerium/psychosis/paranoia HENT: Normocephalic, Mucus membranes moist.  No thrush Neck: Supple, No tracheal deviation Chest: No pain.  Good respiratory excursion. CV:  Pulses intact.  Regular rhythm MS: Normal AROM mjr joints.  No obvious deformity Abdomen: Soft, Nondistended.  Min tender at incisions only.  Min ecchymosis at port sites.  No incarcerated hernias. Ext:  SCDs BLE.  No significant edema.  No cyanosis Skin: No petechiae / purpura  Discharged Condition: good   Past  Medical History  Diagnosis Date  . Panic attacks   . High cholesterol   . Obesity   . Gastric outlet obstruction   . Pyloric ulcer   . Depression   . GERD (gastroesophageal reflux disease)   . Pyloric stenosis     Past Surgical History  Procedure Laterality Date  .  Rhinoplasty      X3  . Colonscopy  AGE 46  . Vagotomy N/A 06/24/2014    Procedure: ROBOTIC TRUNCAL VAGOTOMY ;  Surgeon: Michael Boston, MD;  Location: [facility];  Service: General;  Laterality: N/A;  This is Shelly Hood's preference  . Antrectomy N/A 06/24/2014    Procedure: ROBOTIC DISTAL GASTRECTOMY;  Surgeon: Michael Boston, MD;  Location: [facility];  Service: General;  Laterality: N/A;  . Laparoscopic roux-en-y gastric bypass with hiatal hernia repair N/A 06/24/2014    Procedure: ROBOTIC ROUX-EN-Y CONSTRUCTION WITH HIATAL HERNIA REPAIR;  Surgeon: Michael Boston, MD;  Location: [facility];  Service: General;  Laterality: N/A;    History   Social History  . Marital Status: Married    Spouse Name: N/A  . Number of Children: 2  . Years of Education: N/A   Occupational History  . Real estate management     [employer] in [place] Topics  . Smoking status: Current Every Day Smoker -- 0.25 packs/day for 30 years    Types: Cigarettes  . Smokeless tobacco: Never Used     Comment: 2 packs per week  . Alcohol Use: No  . Drug Use: No  . Sexual Activity:    Partners: Male    Birth Control/ Protection: None   Other Topics Concern  . Not on file   Social History Narrative    Family History  Problem Relation Age of Onset  . Diabetes Mother   . Diabetes Maternal Grandmother   . Heart disease Father     Current Facility-Administered Medications  Medication Dose Route Frequency Provider Last Rate Last Dose  . 0.9 %  sodium chloride infusion  250 mL Intravenous PRN Michael Boston, MD      . acetaminophen (TYLENOL) solution 650 mg  650 mg Oral Q4H PRN Michael Boston, MD      . acetaminophen (TYLENOL) suppository 650 mg  650 mg Rectal Q6H PRN Michael Boston, MD      . ALPRAZolam Duanne Moron) tablet 0.25 mg  0.25 mg Oral BID PRN Michael Boston, MD   0.25 mg at 06/27/14 0958  . alum & mag hydroxide-simeth (MAALOX/MYLANTA) 200-200-20 MG/5ML suspension 30 mL  30 mL Oral Q6H PRN Michael Boston, MD       . antiseptic oral rinse (CPC / CETYLPYRIDINIUM CHLORIDE 0.05%) solution 7 mL  7 mL Mouth Rinse BID Michael Boston, MD   7 mL at 06/27/14 2117  . atorvastatin (LIPITOR) tablet 20 mg  20 mg Oral q1800 Michael Boston, MD   20 mg at 06/27/14 0959  . bisacodyl (DULCOLAX) suppository 10 mg  10 mg Rectal Q12H PRN Michael Boston, MD      . diphenhydrAMINE (BENADRYL) injection 12.5-25 mg  12.5-25 mg Intravenous Q6H PRN Michael Boston, MD      . FLUoxetine (PROZAC) capsule 40 mg  40 mg Oral Daily Michael Boston, MD   40 mg at 06/27/14 0958  . HYDROmorphone (DILAUDID) injection 0.5-2 mg  0.5-2 mg Intravenous Q1H PRN Michael Boston, MD   1 mg at 06/25/14 0456  . lactated ringers bolus 1,000 mL  1,000 mL Intravenous Q8H PRN  Michael Boston, MD      . lip balm [facility]) ointment 1 application  1 application Topical BID Michael Boston, MD   1 application at 77/41/28 2117  . LORazepam (ATIVAN) injection 0.5-1 mg  0.5-1 mg Intravenous Q8H PRN Michael Boston, MD      . magic mouthwash  15 mL Oral QID PRN Michael Boston, MD      . menthol-cetylpyridinium (CEPACOL) lozenge 3 mg  1 lozenge Oral PRN Michael Boston, MD      . methocarbamol (ROBAXIN) 1,000 mg in dextrose 5 % 50 mL IVPB  1,000 mg Intravenous Q6H PRN Michael Boston, MD      . metoprolol (LOPRESSOR) injection 5 mg  5 mg Intravenous Q6H PRN Michael Boston, MD      . ondansetron [facility]) injection 4 mg  4 mg Intravenous Q4H PRN Michael Boston, MD   4 mg at 06/25/14 0753  . pantoprazole (PROTONIX) EC tablet 80 mg  80 mg Oral Q1200 Michael Boston, MD   80 mg at 06/27/14 1002  . phenol (CHLORASEPTIC) mouth spray 2 spray  2 spray Mouth/Throat PRN Michael Boston, MD      . pneumococcal 23 valent vaccine (PNU-IMMUNE) injection 0.5 mL  0.5 mL Intramuscular Tomorrow-1000 Michael Boston, MD      . promethazine (PHENERGAN) injection 6.25-12.5 mg  6.25-12.5 mg Intravenous Q4H PRN Michael Boston, MD      . Derrill Memo ON 06/29/2014] protein supplement Renee Pain VANILLA) powder 2 oz  2 oz Oral QID Michael Boston, MD        Or  . Derrill Memo ON 06/29/2014] protein supplement (UNJURY CHOCOLATE CLASSIC) powder 2 oz  2 oz Oral QID Michael Boston, MD       Or  . Derrill Memo ON 06/29/2014] protein supplement (UNJURY CHICKEN SOUP) powder 2 oz  2 oz Oral QID Michael Boston, MD      . Derrill Memo ON 06/29/2014] protein supplement Renee Pain VANILLA) powder 2 oz  2 oz Oral QID Michael Boston, MD       Or  . Derrill Memo ON 06/29/2014] protein supplement (UNJURY CHOCOLATE CLASSIC) powder 2 oz  2 oz Oral QID Michael Boston, MD       Or  . Derrill Memo ON 06/29/2014] protein supplement (UNJURY CHICKEN SOUP) powder 2 oz  2 oz Oral QID Michael Boston, MD      . sodium chloride 0.9 % injection 3 mL  3 mL Intravenous Q12H Michael Boston, MD   3 mL at 06/27/14 2117  . sodium chloride 0.9 % injection 3 mL  3 mL Intravenous PRN Michael Boston, MD         Allergies  Allergen Reactions  . Cefotetan Other (See Comments)    Thrombocytopenia, plat ct 18K  . Nsaids     DUODENAL ULCER  . Codeine Sulfate     REACTION: nausea only tolerates hydrocodone    Disposition: Final discharge disposition not confirmed  Discharge Instructions    Call MD for:  extreme fatigue    Complete by:  As directed      Call MD for:  extreme fatigue    Complete by:  As directed      Call MD for:  hives    Complete by:  As directed      Call MD for:  hives    Complete by:  As directed      Call MD for:  persistant nausea and vomiting    Complete by:  As directed      Call  MD for:  persistant nausea and vomiting    Complete by:  As directed      Call MD for:  redness, tenderness, or signs of infection (pain, swelling, redness, odor or green/yellow discharge around incision site)    Complete by:  As directed      Call MD for:  redness, tenderness, or signs of infection (pain, swelling, redness, odor or green/yellow discharge around incision site)    Complete by:  As directed      Call MD for:  severe uncontrolled pain    Complete by:  As directed      Call MD for:  severe uncontrolled  pain    Complete by:  As directed      Call MD for:    Complete by:  As directed   Temperature > 101.38F     Call MD for:    Complete by:  As directed   Temperature > 101.38F     Diet general    Complete by:  As directed   See diet instruction sheets.  In general, start with frequent small meals of pureed consistency & gradually advance to a more regular diet over the next few weeks     Discharge instructions    Complete by:  As directed   Please see discharge instruction sheets.  Also refer to handout given an office.  Please call our office if you have any questions or concerns [phone]     Discharge instructions    Complete by:  As directed   Please see discharge instruction sheets.  Also refer to handout given an office.  Please call our office if you have any questions or concerns [phone]     Discharge wound care:    Complete by:  As directed   If you have closed incisions, shower and bathe over these incisions with soap and water every day.  Remove all surgical dressings on postoperative day #3.  You do not need to replace dressings over the closed incisions unless you feel more comfortable with a Band-Aid covering it.   If you have an open wound that requires packing, please see wound care instructions.  In general, remove all dressings, wash wound with soap and water and then replace with saline moistened gauze.  Do the dressing change at least every day.  Please call our office [phone] if you have further questions.     Discharge wound care:    Complete by:  As directed   If you have closed incisions, shower and bathe over these incisions with soap and water every day.  Remove all surgical dressings on postoperative day #3.  You do not need to replace dressings over the closed incisions unless you feel more comfortable with a Band-Aid covering it.   If you have an open wound that requires packing, please see wound care instructions.  In general, remove all dressings,  wash wound with soap and water and then replace with saline moistened gauze.  Do the dressing change at least every day.  Please call our office [phone] if you have further questions.     Driving Restrictions    Complete by:  As directed   No driving until off narcotics and can safely swerve away without pain during an emergency     Driving Restrictions    Complete by:  As directed   No driving until off narcotics and can safely swerve away without pain during an emergency  Increase activity slowly    Complete by:  As directed   Walk an hour a day.  Use 20-30 minute walks.  When you can walk 30 minutes without difficulty, increase to low impact/moderate activities such as biking, jogging, swimming, sexual activity..  Eventually can increase to unrestricted activity when not feeling pain.  If you feel pain: STOP!Marland Kitchen   Let pain protect you from overdoing it.  Use ice/heat/over-the-counter pain medications to help minimize his soreness.  Use pain prescriptions as needed to remain active.  It is better to take extra pain medications and be more active than to stay bedridden to avoid all pain medications.     Increase activity slowly    Complete by:  As directed   Walk an hour a day.  Use 20-30 minute walks.  When you can walk 30 minutes without difficulty, increase to low impact/moderate activities such as biking, jogging, swimming, sexual activity..  Eventually can increase to unrestricted activity when not feeling pain.  If you feel pain: STOP!Marland Kitchen   Let pain protect you from overdoing it.  Use ice/heat/over-the-counter pain medications to help minimize his soreness.  Use pain prescriptions as needed to remain active.  It is better to take extra pain medications and be more active than to stay bedridden to avoid all pain medications.     Lifting restrictions    Complete by:  As directed   Avoid heavy lifting initially.  Do not push through pain.  You have no specific weight limit.  Coughing and  sneezing or four more stressful to your incision than any lifting you will do. Pain will protect you from injury.  Therefore, avoid intense activity until off all narcotic pain medications.  Coughing and sneezing or four more stressful to your incision than any lifting he will do.     Lifting restrictions    Complete by:  As directed   Avoid heavy lifting initially.  Do not push through pain.  You have no specific weight limit.  Coughing and sneezing or four more stressful to your incision than any lifting you will do. Pain will protect you from injury.  Therefore, avoid intense activity until off all narcotic pain medications.  Coughing and sneezing or four more stressful to your incision than any lifting he will do.     May shower / Bathe    Complete by:  As directed      May shower / Bathe    Complete by:  As directed      May walk up steps    Complete by:  As directed      May walk up steps    Complete by:  As directed      Sexual Activity Restrictions    Complete by:  As directed   Sexual activity as tolerated.  Do not push through pain.  Pain will protect you from injury.     Sexual Activity Restrictions    Complete by:  As directed   Sexual activity as tolerated.  Do not push through pain.  Pain will protect you from injury.     Walk with assistance    Complete by:  As directed   Walk over an hour a day.  May use a walker/cane/companion to help with balance and stamina.     Walk with assistance    Complete by:  As directed   Walk over an hour a day.  May use a walker/cane/companion to help with balance and stamina.  Medication List    TAKE these medications        acetaminophen 500 MG tablet  Commonly known as:  TYLENOL  Take 500 mg by mouth every 6 (six) hours as needed for mild pain.     ALPRAZolam 0.25 MG tablet  Commonly known as:  XANAX  Take 1 tablet (0.25 mg total) by mouth 2 (two) times daily as needed for sleep or anxiety.     atorvastatin 20 MG  tablet  Commonly known as:  LIPITOR  Take 1 tablet (20 mg total) by mouth daily at 6 PM.     calcium-vitamin D 500-200 MG-UNIT per tablet  Commonly known as:  OSCAL WITH D  Take 1 tablet by mouth daily.     esomeprazole 40 MG capsule  Commonly known as:  NEXIUM  Take 40 mg by mouth every morning.     FLUoxetine 40 MG capsule  Commonly known as:  PROZAC  Take 1 capsule (40 mg total) by mouth daily.     oxyCODONE 5 MG immediate release tablet  Commonly known as:  Oxy IR/ROXICODONE  Take 1-2 tablets (5-10 mg total) by mouth every 4 (four) hours as needed for moderate pain, severe pain or breakthrough pain.           Follow-up Information    Follow up with Denitra Donaghey C., MD. Schedule an appointment as soon as possible for a visit in 2 weeks.   Specialty:  General Surgery   Why:  To follow up after your operation, To follow up after your hospital stay   Contact information:   [place] [address] [phone]        Signed: Morton Peters, M.D., F.A.C.S. Gastrointestinal and Minimally Invasive Surgery [facility] [address] [place], [place] [address] [phone] Main / Paging   06/28/2014, 8:31 AM

## 2014-07-29 ENCOUNTER — Emergency Department (HOSPITAL_COMMUNITY)
Admission: EM | Admit: 2014-07-29 | Discharge: 2014-07-29 | Disposition: A | Discharge status: Home / Self Care | Payer: BLUE CROSS/BLUE SHIELD | Attending: Emergency Medicine | Admitting: Emergency Medicine

## 2014-07-29 ENCOUNTER — Encounter (HOSPITAL_COMMUNITY): Payer: Self-pay

## 2014-07-29 DIAGNOSIS — A047 Enterocolitis due to Clostridium difficile: Secondary | ICD-10-CM | POA: Diagnosis not present

## 2014-07-29 DIAGNOSIS — Z8711 Personal history of peptic ulcer disease: Secondary | ICD-10-CM | POA: Diagnosis not present

## 2014-07-29 DIAGNOSIS — F329 Major depressive disorder, single episode, unspecified: Secondary | ICD-10-CM | POA: Insufficient documentation

## 2014-07-29 DIAGNOSIS — Z8719 Personal history of other diseases of the digestive system: Secondary | ICD-10-CM | POA: Insufficient documentation

## 2014-07-29 DIAGNOSIS — F410 Panic disorder [episodic paroxysmal anxiety] without agoraphobia: Secondary | ICD-10-CM | POA: Diagnosis not present

## 2014-07-29 DIAGNOSIS — Z87891 Personal history of nicotine dependence: Secondary | ICD-10-CM | POA: Diagnosis not present

## 2014-07-29 DIAGNOSIS — Z9884 Bariatric surgery status: Secondary | ICD-10-CM | POA: Insufficient documentation

## 2014-07-29 DIAGNOSIS — E780 Pure hypercholesterolemia: Secondary | ICD-10-CM | POA: Diagnosis not present

## 2014-07-29 DIAGNOSIS — R1030 Lower abdominal pain, unspecified: Secondary | ICD-10-CM | POA: Diagnosis present

## 2014-07-29 DIAGNOSIS — Z79899 Other long term (current) drug therapy: Secondary | ICD-10-CM | POA: Diagnosis not present

## 2014-07-29 DIAGNOSIS — A0471 Enterocolitis due to Clostridium difficile, recurrent: Secondary | ICD-10-CM

## 2014-07-29 LAB — CBC WITH DIFFERENTIAL/PLATELET
Basophils Absolute: 0 10*3/uL (ref 0.0–0.1)
Basophils Relative: 0 % (ref 0–1)
Eosinophils Absolute: 0.1 10*3/uL (ref 0.0–0.7)
Eosinophils Relative: 1 % (ref 0–5)
HCT: 41.7 % (ref 36.0–46.0)
Hemoglobin: 13.5 g/dL (ref 12.0–15.0)
LYMPHS PCT: 27 % (ref 12–46)
Lymphs Abs: 2.6 10*3/uL (ref 0.7–4.0)
MCH: 28.8 pg (ref 26.0–34.0)
MCHC: 32.4 g/dL (ref 30.0–36.0)
MCV: 88.9 fL (ref 78.0–100.0)
MONOS PCT: 13 % — AB (ref 3–12)
Monocytes Absolute: 1.2 10*3/uL — ABNORMAL HIGH (ref 0.1–1.0)
NEUTROS PCT: 59 % (ref 43–77)
Neutro Abs: 5.6 10*3/uL (ref 1.7–7.7)
PLATELETS: 364 10*3/uL (ref 150–400)
RBC: 4.69 MIL/uL (ref 3.87–5.11)
RDW: 14.3 % (ref 11.5–15.5)
WBC: 9.5 10*3/uL (ref 4.0–10.5)

## 2014-07-29 LAB — URINALYSIS, ROUTINE W REFLEX MICROSCOPIC
GLUCOSE, UA: NEGATIVE mg/dL
HGB URINE DIPSTICK: NEGATIVE
KETONES UR: NEGATIVE mg/dL
Leukocytes, UA: NEGATIVE
Nitrite: NEGATIVE
PH: 6 (ref 5.0–8.0)
Protein, ur: NEGATIVE mg/dL
SPECIFIC GRAVITY, URINE: 1.031 — AB (ref 1.005–1.030)
Urobilinogen, UA: 0.2 mg/dL (ref 0.0–1.0)

## 2014-07-29 LAB — COMPREHENSIVE METABOLIC PANEL
ALBUMIN: 3.6 g/dL (ref 3.5–5.0)
ALK PHOS: 91 U/L (ref 38–126)
ALT: 18 U/L (ref 14–54)
AST: 22 U/L (ref 15–41)
Anion gap: 10 (ref 5–15)
BUN: 9 mg/dL (ref 6–20)
CHLORIDE: 104 mmol/L (ref 101–111)
CO2: 24 mmol/L (ref 22–32)
Calcium: 9.2 mg/dL (ref 8.9–10.3)
Creatinine, Ser: 0.6 mg/dL (ref 0.44–1.00)
GFR calc Af Amer: >60 mL/min (ref >60)
GFR calc non Af Amer: >60 mL/min (ref >60)
Glucose, Bld: 101 mg/dL — ABNORMAL HIGH (ref 65–99)
POTASSIUM: 3.3 mmol/L — AB (ref 3.5–5.1)
SODIUM: 138 mmol/L (ref 135–145)
Total Bilirubin: 0.3 mg/dL (ref 0.3–1.2)
Total Protein: 7.5 g/dL (ref 6.5–8.1)

## 2014-07-29 MED ORDER — VANCOMYCIN 50 MG/ML ORAL SOLUTION
125.0000 mg | Freq: Once | ORAL | Status: AC
  Administered 2014-07-29: 125 mg via ORAL
  Filled 2014-07-29: qty 2.5

## 2014-07-29 MED ORDER — VANCOMYCIN HCL 125 MG PO CAPS: 125.0000 mg | ORAL_CAPSULE | Freq: Four times a day (QID) | ORAL | Status: DC

## 2014-07-29 MED ORDER — SODIUM CHLORIDE 0.9 % IV BOLUS (SEPSIS)
1000.0000 mL | Freq: Once | INTRAVENOUS | Status: AC
  Administered 2014-07-29: 1000 mL via INTRAVENOUS

## 2014-07-29 NOTE — ED Notes (Signed)
Pt, being sent by [facility], c/o diarrhea.  Pt previously diagnosed w/ C Diff and completed 14 days of Flagyl.  Stool sample checked yesterday and was positive for C Diff.

## 2014-07-29 NOTE — ED Notes (Signed)
Pt arrived at 1430 and was not enter as arrived in computer.  Triage delayed.  Pt placed in holding according to arrival time. Pt aware and understanding of delay

## 2014-07-29 NOTE — ED Notes (Signed)
Questions denied r/t dc. Pt ambulatory and a&ox4

## 2014-07-29 NOTE — Discharge Instructions (Signed)
Please read and follow all provided instructions.  Your diagnoses today include:  1. Recurrent colitis due to Clostridium difficile    Tests performed today include:  Blood counts and electrolytes  Blood tests to check liver and kidney function  Urine test to look for infection and pregnancy (in women)  Vital signs. See below for your results today.   Medications prescribed:   Vancomycin - antibiotic for C. Diff  Take any prescribed medications only as directed.  Home care instructions:   Follow any educational materials contained in this packet.  Follow-up instructions: Please follow-up with your primary care provider in the next 3 days for further evaluation of your symptoms.    Return instructions:  SEEK IMMEDIATE MEDICAL ATTENTION IF:  The pain does not go away or becomes severe   A temperature above 101F develops   Repeated vomiting occurs (multiple episodes)   The pain becomes localized to portions of the abdomen. The right side could possibly be appendicitis. In an adult, the left lower portion of the abdomen could be colitis or diverticulitis.   Blood is being passed in stools or vomit (bright red or black tarry stools)   You develop chest pain, difficulty breathing, dizziness or fainting, or become confused, poorly responsive, or inconsolable (young children)  If you have any other emergent concerns regarding your health  Additional Information: Abdominal (belly) pain can be caused by many things. Your caregiver performed an examination and possibly ordered blood/urine tests and imaging (CT scan, x-rays, ultrasound). Many cases can be observed and treated at home after initial evaluation in the emergency department. Even though you are being discharged home, abdominal pain can be unpredictable. Therefore, you need a repeated exam if your pain does not resolve, returns, or worsens. Most patients with abdominal pain don't have to be admitted to the hospital or  have surgery, but serious problems like appendicitis and gallbladder attacks can start out as nonspecific pain. Many abdominal conditions cannot be diagnosed in one visit, so follow-up evaluations are very important.  Your vital signs today were: BP 128/64 mmHg   Pulse 75   Temp(Src) 98.7 F (37.1 C) (Oral)   Resp 18   SpO2 100% If your blood pressure (bp) was elevated above 135/85 this visit, please have this repeated by your doctor within one month. --------------

## 2014-07-29 NOTE — ED Provider Notes (Signed)
CSN: [id number]     Arrival date & time 07/29/14  1430 History   First MD Initiated Contact with Patient 07/29/14 1736     Chief Complaint  Patient presents with  . Abdominal Pain     (Consider location/radiation/quality/duration/timing/severity/associated sxs/prior Treatment) HPI Comments: Patient presents with complaint of recurrent C. difficile colitis. 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 She was on antibiotics prior to this and around the time of her surgery. Shortly after her admission the patient developed profuse watery foul-smelling stools. Patient had a test for C. difficile which was positive. Patient was started on Flagyl. She took the entire course for 2 weeks which slowed her stool but it never returned to normal. Patient completed the course 5 days ago and within 3 days she began having return of symptoms. Patient describes water recent foul-smelling stools every 2 hours. No blood. She was tested again for C. difficile yesterday and the test was positive. Patient consulted with her surgeon today and was told to come to the emergency department for IV fluids and antibiotics. Patient states he has had a fever to 99.35F. She has crampy lower abdominal pain when she has to have a bowel movement. She reports decreased appetite and oral intake. She states that she did not tolerate the Flagyl very well and it caused her to feel very sick and for food to taste like metal. She requests a new type of antibiotic. The onset of this condition was acute. The course is recurrent. Aggravating factors: none. Alleviating factors: none.    Patient is a 59 y.o. female presenting with abdominal pain. The history is provided by the patient and medical records.  Abdominal Pain Associated symptoms: diarrhea and nausea   Associated symptoms: no chest pain, no cough, no dysuria, no fever, no sore throat and no vomiting     Past Medical History   Diagnosis Date  . Panic attacks   . High cholesterol   . Obesity   . Gastric outlet obstruction   . Pyloric ulcer   . Depression   . GERD (gastroesophageal reflux disease)   . Pyloric stenosis    Past Surgical History  Procedure Laterality Date  . Rhinoplasty      X3  . Colonscopy  AGE 14  . Vagotomy N/A 06/24/2014    Procedure: ROBOTIC TRUNCAL VAGOTOMY ;  Surgeon: Michael Boston, MD;  Location: [facility];  Service: General;  Laterality: N/A;  This is [name]'s preference  . Antrectomy N/A 06/24/2014    Procedure: ROBOTIC DISTAL GASTRECTOMY;  Surgeon: Michael Boston, MD;  Location: [facility];  Service: General;  Laterality: N/A;  . Laparoscopic roux-en-y gastric bypass with hiatal hernia repair N/A 06/24/2014    Procedure: ROBOTIC ROUX-EN-Y CONSTRUCTION WITH HIATAL HERNIA REPAIR;  Surgeon: Michael Boston, MD;  Location: [facility];  Service: General;  Laterality: N/A;   Family History  Problem Relation Age of Onset  . Diabetes Mother   . Diabetes Maternal Grandmother   . Heart disease Father    History  Substance Use Topics  . Smoking status: Former Smoker -- 0.25 packs/day for 30 years    Types: Cigarettes  . Smokeless tobacco: Never Used     Comment: 2 packs per week  . Alcohol Use: No   OB History    No data available     Review of Systems  Constitutional: Positive for appetite change. Negative for fever.  HENT: Negative for rhinorrhea and sore  throat.   Eyes: Negative for redness.  Respiratory: Negative for cough.   Cardiovascular: Negative for chest pain.  Gastrointestinal: Positive for nausea, abdominal pain and diarrhea. Negative for vomiting.  Genitourinary: Negative for dysuria.  Musculoskeletal: Negative for myalgias.  Skin: Negative for rash.  Neurological: Negative for headaches.      Allergies  Cefotetan; Nsaids; and Codeine sulfate  Home Medications   Prior to Admission medications   Medication Sig Start Date End Date Taking? Authorizing Provider   acetaminophen (TYLENOL) 500 MG tablet Take 1,000 mg by mouth every 6 (six) hours as needed for mild pain or fever (pain and fever).    Yes Historical Provider, MD  ALPRAZolam (XANAX) 0.25 MG tablet Take 1 tablet (0.25 mg total) by mouth 2 (two) times daily as needed for sleep or anxiety. Patient taking differently: Take 0.25 mg by mouth daily.  04/27/14  Yes Venia Carbon, MD  atorvastatin (LIPITOR) 20 MG tablet Take 1 tablet (20 mg total) by mouth daily at 6 PM. 04/27/14  Yes Venia Carbon, MD  FLUoxetine (PROZAC) 40 MG capsule Take 1 capsule (40 mg total) by mouth daily. 04/27/14  Yes Venia Carbon, MD  loperamide (IMODIUM A-D) 2 MG tablet Take 2 mg by mouth 3 (three) times daily as needed for diarrhea or loose stools (diarrhea).   Yes Historical Provider, MD  oxyCODONE (OXY IR/ROXICODONE) 5 MG immediate release tablet Take 1-2 tablets (5-10 mg total) by mouth every 4 (four) hours as needed for moderate pain, severe pain or breakthrough pain. Patient not taking: Reported on 07/29/2014 06/24/14   Michael Boston, MD   BP 128/64 mmHg  Pulse 75  Temp(Src) 98.7 F (37.1 C) (Oral)  Resp 18  SpO2 100%   Physical Exam  Constitutional: She appears well-developed and well-nourished.  HENT:  Head: Normocephalic and atraumatic.  Mouth/Throat: Oropharynx is clear and moist. No oropharyngeal exudate.  Eyes: Conjunctivae are normal. Right eye exhibits no discharge. Left eye exhibits no discharge.  Neck: Normal range of motion. Neck supple.  Cardiovascular: Normal rate, regular rhythm and normal heart sounds.   No murmur heard. Pulmonary/Chest: Effort normal and breath sounds normal. No respiratory distress. She has no wheezes. She has no rales.  Abdominal: Soft. Bowel sounds are normal. There is tenderness (mild, generalized). There is no rebound and no guarding.  Musculoskeletal: She exhibits no edema or tenderness.  Neurological: She is alert.  Skin: Skin is warm and dry.  Psychiatric: She has  a normal mood and affect.  Nursing note and vitals reviewed.   ED Course  Procedures (including critical care time) Labs Review Labs Reviewed  CBC WITH DIFFERENTIAL/PLATELET - Abnormal; Notable for the following:    Monocytes Relative 13 (*)    Monocytes Absolute 1.2 (*)    All other components within normal limits  COMPREHENSIVE METABOLIC PANEL - Abnormal; Notable for the following:    Potassium 3.3 (*)    Glucose, Bld 101 (*)    All other components within normal limits  URINALYSIS, ROUTINE W REFLEX MICROSCOPIC - Abnormal; Notable for the following:    Color, Urine AMBER (*)    Specific Gravity, Urine 1.031 (*)    Bilirubin Urine SMALL (*)    All other components within normal limits    Imaging Review No results found.   EKG Interpretation None       6:01 PM Patient seen and examined. Work-up initiated. Fluids ordered.   Vital signs reviewed and are as follows: BP 128/64 mmHg  Pulse 75  Temp(Src) 98.7 F (37.1 C) (Oral)  Resp 18  SpO2 100%  7:39 PM Patient discussed with and seen by Dr. Zenia Resides. Plan is to discharge to home on oral vancomycin. First dose given in emergency department. Patient to follow-up with her PCP for continued management and treatment of her C. difficile colitis.  The patient was urged to return to the Emergency Department immediately with worsening of current symptoms, worsening abdominal pain, persistent vomiting, blood noted in stools, fever, or any other concerns. The patient verbalized understanding.   MDM   Final diagnoses:  Recurrent colitis due to Clostridium difficile   Patient with reported recurrence of C. difficile colitis. Labs are reassuring. Patient does not appear to be profoundly dehydrated. Will start on second line therapy. Feel that the patient is safe for discharge home with PCP follow-up for continued management. No indication for admission at this time. Patient is well-appearing, nontoxic.   Carlisle Cater,  PA-C 07/29/14 1941

## 2014-07-29 NOTE — ED Provider Notes (Signed)
Medical screening examination/treatment/procedure(s) were conducted as a shared visit with non-physician practitioner(s) and myself.  I personally evaluated the patient during the encounter.   EKG Interpretation None     Patient here with recurrence of C. difficile colitis. She is well-hydrated. Is having some abdominal pain. Will be placed on vancomycin and discharged home with return precautions  Lacretia Leigh, MD 07/29/14 1931

## 2014-07-29 NOTE — ED Notes (Signed)
Pt has had c-diff x 2 weeks and done antibiotics.  Pt has been sent here today by MD. Test yesterday confirmed continued c-diff.

## 2014-08-17 ENCOUNTER — Telehealth: Payer: Self-pay | Admitting: Gastroenterology

## 2014-08-17 NOTE — Telephone Encounter (Signed)
Pt wanted to know if she should start a probiotic due to repeat cdiff. Pt instructed that she could take a probiotic bid for about a month after finishing antibiotics. Pt verbalized understanding and will call back if diarrhea returns.

## 2014-09-13 ENCOUNTER — Other Ambulatory Visit (INDEPENDENT_AMBULATORY_CARE_PROVIDER_SITE_OTHER): Payer: BLUE CROSS/BLUE SHIELD

## 2014-09-13 ENCOUNTER — Telehealth: Payer: Self-pay | Admitting: Gastroenterology

## 2014-09-13 ENCOUNTER — Encounter: Payer: Self-pay | Admitting: Nurse Practitioner

## 2014-09-13 ENCOUNTER — Ambulatory Visit: Payer: BLUE CROSS/BLUE SHIELD | Admitting: Internal Medicine

## 2014-09-13 ENCOUNTER — Ambulatory Visit (INDEPENDENT_AMBULATORY_CARE_PROVIDER_SITE_OTHER): Payer: BLUE CROSS/BLUE SHIELD | Admitting: Nurse Practitioner

## 2014-09-13 VITALS — BP 112/68 | HR 77 | Wt 143.0 lb

## 2014-09-13 DIAGNOSIS — R197 Diarrhea, unspecified: Secondary | ICD-10-CM

## 2014-09-13 DIAGNOSIS — Z8619 Personal history of other infectious and parasitic diseases: Secondary | ICD-10-CM | POA: Diagnosis not present

## 2014-09-13 LAB — COMPREHENSIVE METABOLIC PANEL
ALT: 17 U/L (ref 0–35)
AST: 20 U/L (ref 0–37)
Albumin: 3.5 g/dL (ref 3.5–5.2)
Alkaline Phosphatase: 100 U/L (ref 39–117)
BILIRUBIN TOTAL: 0.3 mg/dL (ref 0.2–1.2)
BUN: 9 mg/dL (ref 6–23)
CHLORIDE: 103 meq/L (ref 96–112)
CO2: 29 meq/L (ref 19–32)
Calcium: 9.3 mg/dL (ref 8.4–10.5)
Creatinine, Ser: 0.66 mg/dL (ref 0.40–1.20)
GFR: 98.24 mL/min (ref >60.00)
Glucose, Bld: 101 mg/dL — ABNORMAL HIGH (ref 70–99)
Potassium: 3.8 mEq/L (ref 3.5–5.1)
Sodium: 140 mEq/L (ref 135–145)
Total Protein: 7.2 g/dL (ref 6.0–8.3)

## 2014-09-13 LAB — CBC
HEMATOCRIT: 38.6 % (ref 36.0–46.0)
HEMOGLOBIN: 12.8 g/dL (ref 12.0–15.0)
MCHC: 33 g/dL (ref 30.0–36.0)
MCV: 85.7 fl (ref 78.0–100.0)
PLATELETS: 320 10*3/uL (ref 150.0–400.0)
RBC: 4.5 Mil/uL (ref 3.87–5.11)
RDW: 14.2 % (ref 11.5–15.5)
WBC: 7.4 10*3/uL (ref 4.0–10.5)

## 2014-09-13 NOTE — Telephone Encounter (Signed)
Patient states she has had C. Diff twice since her surgery in April. She started having diarrhea again after eating over the weekend. States she is still having diarrhea. Scheduled with Tye Savoy, NP today at 3:00 PM.

## 2014-09-13 NOTE — Patient Instructions (Signed)
Your physician has requested that you go to the basement for  lab work before leaving today.   

## 2014-09-14 ENCOUNTER — Telehealth: Payer: Self-pay | Admitting: Nurse Practitioner

## 2014-09-14 LAB — FECAL LACTOFERRIN, QUANT: Lactoferrin: POSITIVE

## 2014-09-14 LAB — CLOSTRIDIUM DIFFICILE BY PCR: Toxigenic C. Difficile by PCR: DETECTED — CR

## 2014-09-14 NOTE — Telephone Encounter (Signed)
Calling back to check if we have her CDiff results yet? Concerned about having it and unknowingly spreading it.

## 2014-09-14 NOTE — Telephone Encounter (Signed)
Spoke with pt and let her know cdiff result is not back yet. Pt would like to be called at this number when results back.

## 2014-09-14 NOTE — Telephone Encounter (Signed)
Pt calling for cdiff results. Please advise.  

## 2014-09-15 ENCOUNTER — Telehealth: Payer: Self-pay

## 2014-09-15 ENCOUNTER — Other Ambulatory Visit: Payer: Self-pay

## 2014-09-15 ENCOUNTER — Encounter: Payer: Self-pay | Admitting: Nurse Practitioner

## 2014-09-15 DIAGNOSIS — R197 Diarrhea, unspecified: Secondary | ICD-10-CM | POA: Insufficient documentation

## 2014-09-15 DIAGNOSIS — Z8619 Personal history of other infectious and parasitic diseases: Secondary | ICD-10-CM | POA: Insufficient documentation

## 2014-09-15 DIAGNOSIS — A498 Other bacterial infections of unspecified site: Secondary | ICD-10-CM

## 2014-09-15 MED ORDER — VANCOMYCIN HCL 250 MG PO CAPS: 250.0000 mg | ORAL_CAPSULE | Freq: Four times a day (QID) | ORAL | Status: DC

## 2014-09-15 NOTE — Telephone Encounter (Signed)
-----   Message from Willia Craze, NP sent at 09/15/2014 11:40 AM EDT ----- Correction. She did get Vancomycin and completed two weeks worth. I will talk with her primary GI to see if he wants a referral to Infectious Disease or repeat course of Vancomycin(maybe a 4 week tapering course this time). In meantime please let patient know that test is positive. Thanks

## 2014-09-15 NOTE — Telephone Encounter (Signed)
Do you want her on an antibiotic until Dr. Fuller Plan returns? Pt states she is feeling worse.

## 2014-09-15 NOTE — Progress Notes (Signed)
Reviewed and agree with management plan.  Malcolm T. Stark, MD FACG 

## 2014-09-15 NOTE — Telephone Encounter (Signed)
Pt calling back for cdiff results, states her symptoms are worse. Please advise.

## 2014-09-15 NOTE — Telephone Encounter (Signed)
Pt is calling back about test results because her symptoms are getting worse

## 2014-09-15 NOTE — Progress Notes (Signed)
     History of Present Illness:   Patient is 59 year old female known to Dr. Fuller Plan. She has been followed here most recently for pyloric stenosis status requiring pyloric channel dilation. Symptoms refractory to dilation, patient was referred for surgical evaluation. In April she underwent a distal gastrectomy, truncal vagotomyl vagotomy, and hiatal hernia repair by Dr. Michael Boston. Patient doing well postoperatively except that she has been struggling with C. difficile infection.   In early May patient diagnosed with C. Difficile, she was treated with 2 weeks of Flagyl and had complete resolution of diarrhea. Within a few days of completing flagyl the diarrhea recurred, C. difficile test positive again. Patient was then treated with 2 weeks of vancomycin which she completed nearly a month ago. Diarrhea resolved but then last weekend she had recurrent loose stool, nausea. Patient concerned she has recurrent C. Difficile. She has been taking a probiotic and hasn't had any recent antibiotics other than flagyl and vanco.    Current Medications, Allergies, Past Medical History, Past Surgical History, Family History and Social History were reviewed in [employer] record.  Physical Exam: General: Pleasant, well developed , white female in no acute distress Head: Normocephalic and atraumatic Eyes:  sclerae anicteric, conjunctiva pink  Ears: Normal auditory acuity Lungs: Clear throughout to auscultation Heart: Regular rate and rhythm Abdomen: Soft, non distended, mild diffuse lower abdominal tenderness.  No masses, no hepatomegaly. Normal bowel sounds Musculoskeletal: Symmetrical with no gross deformities  Extremities: No edema  Neurological: Alert oriented x 4, grossly nonfocal Psychological:  Alert and cooperative. Normal mood and affect  Assessment and Recommendations:   50. 59 year old female with relapsing c-diff diarrhea. She completed a course of flagyl, got better  then had recurrent diarrhea and tested positive for c-diff again. She was treated with two weeks of Vancomycin and diarrhea resolved. Now almost a month later she is back with diarrhea. Will check stool for c-diff again. If positive will consider another course of Vancomycin vrs Infectious Disease consult ( ? candidate for Dificid).   2. Pyloric stenosis, s/p distal gastrectomy, truncal vagotomy,  and hiatal hernia repair mid April

## 2014-09-16 ENCOUNTER — Other Ambulatory Visit: Payer: Self-pay

## 2014-09-16 NOTE — Telephone Encounter (Signed)
See result note.  

## 2014-09-19 NOTE — Telephone Encounter (Signed)
Patient was already started on another course of Vancomycin and is to see ID.

## 2014-09-20 ENCOUNTER — Telehealth: Payer: Self-pay | Admitting: Nurse Practitioner

## 2014-09-20 NOTE — Telephone Encounter (Signed)
ID called back they will be contacting the patient for an appt once they have approval from Dr. Baxter Flattery Left message for patient to call back

## 2014-09-20 NOTE — Telephone Encounter (Signed)
I left a message with ID about the status of the referral

## 2014-09-20 NOTE — Telephone Encounter (Signed)
Patient notified She is aware that they should be in contact with her for the appt in the next few days

## 2014-09-21 ENCOUNTER — Telehealth: Payer: Self-pay | Admitting: Nurse Practitioner

## 2014-09-21 NOTE — Telephone Encounter (Signed)
I spoke with the patient and explained that may take longer to get in with Dr. Laverta Baltimore, but if she felt strongly about it I am happy to make the referral.  Shelly Hood had a long discussion at possible next steps including dificid and even possible fecal transplant.  She has decided to wait on referral. She will call me back if she changes her mind and decides she does want to go to [facility].

## 2014-09-21 NOTE — Telephone Encounter (Signed)
Patient is scheduled for 10/18/14 with Dr. Baxter Flattery

## 2014-10-06 ENCOUNTER — Ambulatory Visit (INDEPENDENT_AMBULATORY_CARE_PROVIDER_SITE_OTHER): Payer: BLUE CROSS/BLUE SHIELD | Admitting: Internal Medicine

## 2014-10-06 ENCOUNTER — Encounter: Payer: Self-pay | Admitting: Internal Medicine

## 2014-10-06 VITALS — BP 110/62 | HR 87 | Temp 98.8°F | Wt 141.0 lb

## 2014-10-06 DIAGNOSIS — A047 Enterocolitis due to Clostridium difficile: Secondary | ICD-10-CM | POA: Diagnosis not present

## 2014-10-06 DIAGNOSIS — A0472 Enterocolitis due to Clostridium difficile, not specified as recurrent: Secondary | ICD-10-CM

## 2014-10-06 LAB — CBC WITH DIFFERENTIAL/PLATELET
Basophils Absolute: 0 10*3/uL (ref 0.0–0.1)
Basophils Relative: 0 % (ref 0–1)
EOS ABS: 0.4 10*3/uL (ref 0.0–0.7)
Eosinophils Relative: 4 % (ref 0–5)
HEMATOCRIT: 38 % (ref 36.0–46.0)
HEMOGLOBIN: 12.7 g/dL (ref 12.0–15.0)
Lymphocytes Relative: 23 % (ref 12–46)
Lymphs Abs: 2.4 10*3/uL (ref 0.7–4.0)
MCH: 28.2 pg (ref 26.0–34.0)
MCHC: 33.4 g/dL (ref 30.0–36.0)
MCV: 84.4 fL (ref 78.0–100.0)
MONOS PCT: 11 % (ref 3–12)
MPV: 9.9 fL (ref 8.6–12.4)
Monocytes Absolute: 1.2 10*3/uL — ABNORMAL HIGH (ref 0.1–1.0)
NEUTROS ABS: 6.5 10*3/uL (ref 1.7–7.7)
NEUTROS PCT: 62 % (ref 43–77)
PLATELETS: 378 10*3/uL (ref 150–400)
RBC: 4.5 MIL/uL (ref 3.87–5.11)
RDW: 14 % (ref 11.5–15.5)
WBC: 10.5 10*3/uL (ref 4.0–10.5)

## 2014-10-06 LAB — BASIC METABOLIC PANEL WITH GFR
BUN: 7 mg/dL (ref 7–25)
CO2: 25 meq/L (ref 20–31)
Calcium: 9.4 mg/dL (ref 8.6–10.4)
Chloride: 100 mEq/L (ref 98–110)
Creat: 0.63 mg/dL (ref 0.50–1.05)
GFR, Est African American: >89 mL/min (ref >=60)
GFR, Est Non African American: >89 mL/min (ref >=60)
GLUCOSE: 96 mg/dL (ref 65–99)
Potassium: 4.8 mEq/L (ref 3.5–5.3)
Sodium: 139 mEq/L (ref 135–146)

## 2014-10-06 NOTE — Progress Notes (Signed)
Subjective:    Patient ID: Shelly Hood, female    DOB: 1955-08-26, 59 y.o.   MRN: [id number]  HPI 59yo F April 15th surgery, started having diarrhea once she was discharged home. Started taking immodium for 6 day and got progressive worse. In April, took metronidazole for 14 days, then when she finished course, then after 2 days starting having watery diarrhea. Went to the ED for rehydration,  given vanco 125mg  QID x 2 wks. Did start feeling better at that time. She felt improved for roughly 2 wk after completion of the vancomycin course but then started feeling poorly again, with watery diarrhea.   She is currently on her 3rd week 250mg  QID of vancomycin. She states that on her last bout she had noted to haveFevers, started to having fevers up to 101, abdominal cramping started 5 days ago.  + fecal incontinence with urination. No blood in stool. Watery. 6-7 bowel movement per day, urgency. Post-prandial BM.   Weight loss 30 # lb since April.  Allergies  Allergen Reactions  . Cefotetan Other (See Comments)    Thrombocytopenia, plat ct 18K  . Nsaids     DUODENAL ULCER  . Codeine Sulfate     REACTION: nausea only tolerates hydrocodone   Current Outpatient Prescriptions on File Prior to Visit  Medication Sig Dispense Refill  . acetaminophen (TYLENOL) 500 MG tablet Take 1,000 mg by mouth every 6 (six) hours as needed for mild pain or fever (pain and fever).     Marland Kitchen atorvastatin (LIPITOR) 20 MG tablet Take 1 tablet (20 mg total) by mouth daily at 6 PM. 90 tablet 3  . FLUoxetine (PROZAC) 40 MG capsule Take 1 capsule (40 mg total) by mouth daily. 90 capsule 3  . vancomycin (VANCOCIN) 250 MG capsule Take 1 capsule (250 mg total) by mouth 4 (four) times daily. 56 capsule 0   No current facility-administered medications on file prior to visit.   Active Ambulatory Problems    Diagnosis Date Noted  . Hyperlipemia 07/30/2006  . Anxiety disorder 07/30/2006  . TOBACCO ABUSE 07/30/2006  .  ALLERGIC RHINITIS 07/30/2006  . GERD 02/22/2009  . OSTEOPENIA 10/14/2006  . Obesity 04/15/2011  . 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 Thrombocytopenia 06/26/2014  . Diarrhea 09/15/2014  . Hx of Clostridium difficile infection 09/15/2014   Resolved Ambulatory Problems    Diagnosis Date Noted  . DEPRESSION 07/30/2006  . LIVER FUNCTION TESTS, ABNORMAL 08/22/2008  . Acute upper respiratory infections of unspecified site 03/06/2011  . Routine general medical examination at a health care facility 04/15/2011  . Viral wart 04/15/2011  . Upper respiratory infection 05/28/2013  . Abdominal pain, epigastric 11/23/2013  . Dermatofibroma of trunk 11/23/2013  . 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 Panic attacks   . High cholesterol   . Gastric outlet obstruction   . Pyloric ulcer   . Depression   . GERD (gastroesophageal reflux disease)   . Pyloric stenosis   . Clostridium difficile infection    History  Substance Use Topics  . Smoking status: Former Smoker -- 0.25 packs/day for 30 years    Types: Cigarettes  . Smokeless tobacco: Never Used     Comment: 2 packs per week  . Alcohol Use: No   family history includes Diabetes in her maternal grandmother and mother; Heart disease in her father.  Review of Systems  Constitutional: Negative for fever, chills, diaphoresis, activity change, appetite change,  fatigue and unexpected weight change.  HENT: Negative for congestion, sore throat, rhinorrhea, sneezing, trouble swallowing and sinus pressure.  Eyes: Negative for photophobia and visual disturbance.  Respiratory: Negative for cough, chest tightness, shortness of breath, wheezing and stridor.  Cardiovascular: Negative for chest pain, palpitations and leg swelling.  Gastrointestinal: Negative for nausea, vomiting, abdominal pain, diarrhea, constipation, blood in stool, abdominal distention and anal bleeding.    Genitourinary: Negative for dysuria, hematuria, flank pain and difficulty urinating.  Musculoskeletal: Negative for myalgias, back pain, joint swelling, arthralgias and gait problem.  Skin: Negative for color change, pallor, rash and wound.  Neurological: Negative for dizziness, tremors, weakness and light-headedness.  Hematological: Negative for adenopathy. Does not bruise/bleed easily.  Psychiatric/Behavioral: Negative for behavioral problems, confusion, sleep disturbance, dysphoric mood, decreased concentration and agitation.       Objective:   Physical Exam BP 110/62 mmHg  Pulse 87  Temp(Src) 98.8 F (37.1 C) (Oral)  Wt 141 lb (63.957 kg) Physical Exam  Constitutional:  oriented to person, place, and time. appears well-developed and well-nourished. No distress.  HENT: Wallace/AT, PERRLA, no scleral icterus Mouth/Throat: Oropharynx is clear and moist. No oropharyngeal exudate.  Cardiovascular: Normal rate, regular rhythm and normal heart sounds. Exam reveals no gallop and no friction rub.  No murmur heard.  Pulmonary/Chest: Effort normal and breath sounds normal. No respiratory distress.  has no wheezes.  Neck = supple, no nuchal rigidity Abdominal: Soft. Bowel sounds are normal.  exhibits no distension. There is no tenderness.  Lymphadenopathy: no cervical adenopathy. No axillary adenopathy Neurological: alert and oriented to person, place, and time.  Skin: Skin is warm and dry. No rash noted. No erythema.  Psychiatric: a normal mood and affect.  behavior is normal.      Assessment & Plan:  Recurrent cdifficile for fmt = she would be a good candidate for fecal transplant to cure her recurrent cdifficile. We will have her taper to 125mg  QID x 14 days with the intent of treating her on with FMTxp once she finishes the 14 day course. We discussed various options and will likely proceed for stool bank from [facility]. Discussed IRB, informed consent for the FMTxp.  Spent 60 min with  patient with greater than 50% on counseling of treatment plan.

## 2014-10-07 ENCOUNTER — Telehealth: Payer: Self-pay

## 2014-10-07 NOTE — Telephone Encounter (Signed)
-----   Message from Gatha Mayer, MD sent at 10/07/2014  2:30 PM EDT ----- Regarding: RE: another [place]  early august? Yes I should be able to do this though I can't see her we can set her up for what would be a direct colonoscopy. We will have her stop her vancomycin 2 days before and take 4 mg of loperamide in the hours before the colonoscopy. I will ask Barbera Setters to see if we can arrange this for the morning of August 10  after the time we do the other one. ----- Message -----    From: Carlyle Basques, MD    Sent: 10/06/2014   9:50 AM      To: Gatha Mayer, MD Subject: another FMT  early august?                     Can you fit in another FMT the week of August 8th. Colletta [place] referred mrs. Greiner to [place] for eval.  She is on her 3rd course of tx. She is interested to doing fecal txp from [facility].

## 2014-10-10 ENCOUNTER — Other Ambulatory Visit: Payer: Self-pay

## 2014-10-10 DIAGNOSIS — A0472 Enterocolitis due to Clostridium difficile, not specified as recurrent: Secondary | ICD-10-CM

## 2014-10-10 NOTE — Telephone Encounter (Signed)
Patient is scheduled for colonoscopy with FMT for 10/19/14 at [facility] for 11:00.  She is to arrive at 10:00  Left message for patient to call back

## 2014-10-10 NOTE — Telephone Encounter (Signed)
Patient notified and she is scheduled for a pre-visit 10/12/14 4:30 for 10/19/14 11:00 colonoscopy with FMT at [facility]

## 2014-10-12 ENCOUNTER — Ambulatory Visit (AMBULATORY_SURGERY_CENTER): Payer: Self-pay | Admitting: *Deleted

## 2014-10-12 VITALS — Ht 60.0 in | Wt 143.0 lb

## 2014-10-12 DIAGNOSIS — B9689 Other specified bacterial agents as the cause of diseases classified elsewhere: Secondary | ICD-10-CM

## 2014-10-12 DIAGNOSIS — A498 Other bacterial infections of unspecified site: Secondary | ICD-10-CM

## 2014-10-12 NOTE — Progress Notes (Signed)
Patient denies any allergies to eggs or soy. Patient denies any problems with anesthesia/sedation. Patient denies any oxygen use at home and does not take any diet/weight loss medications.  

## 2014-10-18 ENCOUNTER — Encounter (HOSPITAL_COMMUNITY): Payer: Self-pay | Admitting: *Deleted

## 2014-10-18 ENCOUNTER — Ambulatory Visit: Payer: BLUE CROSS/BLUE SHIELD | Admitting: Internal Medicine

## 2014-10-19 ENCOUNTER — Ambulatory Visit (HOSPITAL_COMMUNITY): Payer: BLUE CROSS/BLUE SHIELD | Admitting: Anesthesiology

## 2014-10-19 ENCOUNTER — Ambulatory Visit (HOSPITAL_COMMUNITY)
Admission: RE | Admit: 2014-10-19 | Discharge: 2014-10-19 | Disposition: A | Discharge status: Home / Self Care | Payer: BLUE CROSS/BLUE SHIELD | Source: Ambulatory Visit | Attending: Internal Medicine | Admitting: Internal Medicine

## 2014-10-19 ENCOUNTER — Encounter (HOSPITAL_COMMUNITY): Payer: Self-pay

## 2014-10-19 ENCOUNTER — Encounter (HOSPITAL_COMMUNITY)
Admission: RE | Disposition: A | Discharge status: Home / Self Care | Payer: Self-pay | Source: Ambulatory Visit | Attending: Internal Medicine

## 2014-10-19 DIAGNOSIS — A0472 Enterocolitis due to Clostridium difficile, not specified as recurrent: Secondary | ICD-10-CM

## 2014-10-19 DIAGNOSIS — F329 Major depressive disorder, single episode, unspecified: Secondary | ICD-10-CM | POA: Diagnosis not present

## 2014-10-19 DIAGNOSIS — Z79899 Other long term (current) drug therapy: Secondary | ICD-10-CM | POA: Diagnosis not present

## 2014-10-19 DIAGNOSIS — E669 Obesity, unspecified: Secondary | ICD-10-CM | POA: Diagnosis not present

## 2014-10-19 DIAGNOSIS — E780 Pure hypercholesterolemia: Secondary | ICD-10-CM | POA: Diagnosis not present

## 2014-10-19 DIAGNOSIS — A047 Enterocolitis due to Clostridium difficile: Secondary | ICD-10-CM | POA: Insufficient documentation

## 2014-10-19 DIAGNOSIS — F410 Panic disorder [episodic paroxysmal anxiety] without agoraphobia: Secondary | ICD-10-CM | POA: Diagnosis not present

## 2014-10-19 DIAGNOSIS — Z87891 Personal history of nicotine dependence: Secondary | ICD-10-CM | POA: Insufficient documentation

## 2014-10-19 DIAGNOSIS — K219 Gastro-esophageal reflux disease without esophagitis: Secondary | ICD-10-CM | POA: Diagnosis not present

## 2014-10-19 DIAGNOSIS — Z792 Long term (current) use of antibiotics: Secondary | ICD-10-CM | POA: Insufficient documentation

## 2014-10-19 HISTORY — PX: COLONOSCOPY: SHX5424

## 2014-10-19 HISTORY — PX: FECAL TRANSPLANT: SHX6383

## 2014-10-19 SURGERY — COLONOSCOPY
Anesthesia: Monitor Anesthesia Care

## 2014-10-19 MED ORDER — PROPOFOL 10 MG/ML IV BOLUS
INTRAVENOUS | Status: DC | PRN
  Administered 2014-10-19: 10 mg via INTRAVENOUS
  Administered 2014-10-19: 20 mg via INTRAVENOUS

## 2014-10-19 MED ORDER — LIDOCAINE HCL (CARDIAC) 20 MG/ML IV SOLN
INTRAVENOUS | Status: DC | PRN
  Administered 2014-10-19: 50 mg via INTRAVENOUS

## 2014-10-19 MED ORDER — SODIUM CHLORIDE 0.9 % IV SOLN: INTRAVENOUS | Status: DC

## 2014-10-19 MED ORDER — MIDAZOLAM HCL 5 MG/5ML IJ SOLN
INTRAMUSCULAR | Status: DC | PRN
  Administered 2014-10-19: 2 mg via INTRAVENOUS

## 2014-10-19 MED ORDER — PROPOFOL INFUSION 10 MG/ML OPTIME
INTRAVENOUS | Status: DC | PRN
  Administered 2014-10-19: 100 ug/kg/min via INTRAVENOUS

## 2014-10-19 MED ORDER — LACTATED RINGERS IV SOLN
INTRAVENOUS | Status: DC
  Administered 2014-10-19: 11:00:00 via INTRAVENOUS

## 2014-10-19 SURGICAL SUPPLY — 1 items: PREP MICROBIOTA FECAL (Tissue) ×10 IMPLANT

## 2014-10-19 NOTE — Discharge Instructions (Signed)
° °  Procedure was successful and colon exam looked ok though not designed to be a good inspection.  Please take 2 Imodium tablets when you get home.  I appreciate the opportunity to care for you. Gatha Mayer, MD, FACG   YOU HAD AN ENDOSCOPIC PROCEDURE TODAY: Refer to the procedure report and other information in the discharge instructions given to you for any specific questions about what was found during the examination. If this information does not answer your questions, please call Dr. Celesta Aver office at [phone] to clarify.   YOU SHOULD EXPECT: Some feelings of bloating in the abdomen. Passage of more gas than usual. Walking can help get rid of the air that was put into your GI tract during the procedure and reduce the bloating. If you had a lower endoscopy (such as a colonoscopy or flexible sigmoidoscopy) you may notice spotting of blood in your stool or on the toilet paper. Some abdominal soreness may be present for a day or two, also.  DIET: Your first meal following the procedure should be a light meal and then it is ok to progress to your normal diet. A half-sandwich or bowl of soup is an example of a good first meal. Heavy or fried foods are harder to digest and may make you feel nauseous or bloated. Drink plenty of fluids but you should avoid alcoholic beverages for 24 hours.   ACTIVITY: Your care partner should take you home directly after the procedure. You should plan to take it easy, moving slowly for the rest of the day. You can resume normal activity the day after the procedure however YOU SHOULD NOT DRIVE, use power tools, machinery or perform tasks that involve climbing or major physical exertion for 24 hours (because of the sedation medicines used during the test).   SYMPTOMS TO REPORT IMMEDIATELY: A gastroenterologist can be reached at any hour. Please call [phone]  for any of the following symptoms:  Following lower endoscopy (colonoscopy, flexible  sigmoidoscopy) Excessive amounts of blood in the stool  Significant tenderness, worsening of abdominal pains  Swelling of the abdomen that is new, acute  Fever of 100 or higher

## 2014-10-19 NOTE — Anesthesia Preprocedure Evaluation (Signed)
Anesthesia Evaluation  Patient identified by MRN, date of birth, ID band Patient awake    Reviewed: Allergy & Precautions, NPO status , Patient's Chart, lab work & pertinent test results  Airway Mallampati: II  TM Distance: >3 FB Neck ROM: Full    Dental   Pulmonary former smoker,  breath sounds clear to auscultation        Cardiovascular negative cardio ROS  Rhythm:Regular Rate:Normal     Neuro/Psych Anxiety Depression negative neurological ROS     GI/Hepatic Neg liver ROS, PUD, GERD-  ,  Endo/Other  negative endocrine ROS  Renal/GU negative Renal ROS     Musculoskeletal   Abdominal   Peds  Hematology negative hematology ROS (+)   Anesthesia Other Findings   Reproductive/Obstetrics                             Anesthesia Physical Anesthesia Plan  ASA: II  Anesthesia Plan: MAC   Post-op Pain Management:    Induction: Intravenous  Airway Management Planned: Natural Airway and Simple Face Mask  Additional Equipment:   Intra-op Plan:   Post-operative Plan:   Informed Consent: I have reviewed the patients History and Physical, chart, labs and discussed the procedure including the risks, benefits and alternatives for the proposed anesthesia with the patient or authorized representative who has indicated his/her understanding and acceptance.     Plan Discussed with: CRNA  Anesthesia Plan Comments:         Anesthesia Quick Evaluation

## 2014-10-19 NOTE — Transfer of Care (Signed)
Immediate Anesthesia Transfer of Care Note  Patient: Shelly Hood  Procedure(s) Performed: Procedure(s) with comments: COLONOSCOPY (N/A) - with FMT FECAL TRANSPLANT (N/A)  Patient Location: Endoscopy Unit  Anesthesia Type:MAC  Level of Consciousness: awake, alert  and sedated  Airway & Oxygen Therapy: Patient connected to nasal cannula oxygen  Post-op Assessment: Report given to RN  Post vital signs: stable  Last Vitals:  Filed Vitals:   10/19/14 1013  BP: 101/41  Pulse: 65  Temp: 37 C  Resp: 15    Complications: No apparent anesthesia complications

## 2014-10-19 NOTE — Op Note (Signed)
[facility] [place], [address]   COLONOSCOPY PROCEDURE REPORT  PATIENT: Shelly Hood, Shelly Hood  MR#: [id number] BIRTHDATE: [birth year] , 12  yrs. old GENDER: female ENDOSCOPIST: Gatha Mayer, MD, [facility] PROCEDURE DATE:  10/19/2014 PROCEDURE:   Colonoscopy, diagnostic with Fecal microbitica transplant First Screening Colonoscopy - Avg.  risk and is 50 yrs.  old or older - No.  Prior Negative Screening - Now for repeat screening. N/A  History of Adenoma - Now for follow-up colonoscopy & has been > or = to 3 yrs.  N/A  Polyps removed today? No Recommend repeat exam, <10 yrs? Yes Recommend repeat exam, <10 yrs? Yes Recommend repeat exam, <10 yrs? Yes this is not screening exam ASA CLASS:   Class II INDICATIONS:fecal transplant - recurrent C diff colitis. MEDICATIONS: Monitored anesthesia care and Per Anesthesia  DESCRIPTION OF PROCEDURE:   After the risks benefits and alternatives of the procedure were thoroughly explained, informed consent was obtained.  The digital rectal exam revealed no abnormalities of the rectum.   The Pentax Adult Colon [id number] endoscope was introduced through the anus and advanced to the cecum, which was identified by both the appendix and ileocecal valve. No adverse events experienced.   The quality of the prep was good.  The instrument was then slowly withdrawn as the colon was fully examined. Estimated blood loss is zero unless otherwise noted in this procedure report.      COLON FINDINGS: Normal colon - limited views - purpose of procedure was transplant and not inspection.  [facility] stool transplant (approx 250 cc) injected into right colon without problems. Retroflexion was not performed. The time to cecum = 6.3 Withdrawal time = 2.9   The scope was withdrawn and the procedure completed. COMPLICATIONS: There were no immediate complications.  ENDOSCOPIC IMPRESSION: Normal colon - limited views - purpose of  procedure was transplant and not inspection.  [facility] stool transplant (approx 250 cc) injected into right colon without problems  RECOMMENDATIONS: Loperamide 4 mg when home today F/U Dr.  Baxter Flattery as arranged and GI prn  eSigned:  Gatha Mayer, MD, [facility] 10/19/2014 11:56 AM   cc: The Patient, Dr. Fuller Plan and Dr. Silvio Pate

## 2014-10-19 NOTE — Anesthesia Postprocedure Evaluation (Signed)
  Anesthesia Post-op Note  Patient: Shelly Hood  Procedure(s) Performed: Procedure(s) with comments: COLONOSCOPY (N/A) - with FMT FECAL TRANSPLANT (N/A)  Patient Location: PACU  Anesthesia Type:MAC  Level of Consciousness: awake, alert  and oriented  Airway and Oxygen Therapy: Patient Spontanous Breathing  Post-op Pain: none  Post-op Assessment: Post-op Vital signs reviewed              Post-op Vital Signs: Reviewed  Last Vitals:  Filed Vitals:   10/19/14 1220  BP: 94/38  Pulse: 58  Temp:   Resp: 15    Complications: No apparent anesthesia complications

## 2014-10-19 NOTE — H&P (Signed)
[facility] History and Physical   Primary Care Physician:  Viviana Simpler, MD   Reason for Procedure:   fecal transplant  Plan:    Colonoscopy with fecal transplant The risks and benefits as well as alternatives of endoscopic procedure(s) have been discussed and reviewed. All questions answered. The patient agrees to proceed.      HPI: Shelly Hood is a 59 y.o. female with recurrent C diff colitis - here for fecal transplant.   Past Medical History  Diagnosis Date  . Panic attacks   . High cholesterol   . Obesity   . Gastric outlet obstruction   . Pyloric ulcer   . Depression   . GERD (gastroesophageal reflux disease)   . Pyloric stenosis   . Clostridium difficile infection     Past Surgical History  Procedure Laterality Date  . Rhinoplasty      X3  . Colonscopy  AGE 44  . Vagotomy N/A 06/24/2014    Procedure: ROBOTIC TRUNCAL VAGOTOMY ;  Surgeon: Michael Boston, MD;  Location: [facility];  Service: General;  Laterality: N/A;  This is [name]'s preference  . Antrectomy N/A 06/24/2014    Procedure: ROBOTIC DISTAL GASTRECTOMY;  Surgeon: Michael Boston, MD;  Location: [facility];  Service: General;  Laterality: N/A;  . Laparoscopic roux-en-y gastric bypass with hiatal hernia repair N/A 06/24/2014    Procedure: ROBOTIC ROUX-EN-Y CONSTRUCTION WITH HIATAL HERNIA REPAIR;  Surgeon: Michael Boston, MD;  Location: [facility];  Service: General;  Laterality: N/A;    Prior to Admission medications   Medication Sig Start Date End Date Taking? Authorizing Provider  acetaminophen (TYLENOL) 500 MG tablet Take 1,000 mg by mouth every 6 (six) hours as needed for mild pain or fever (pain and fever).    Yes Historical Provider, MD  ALPRAZolam (XANAX) 0.25 MG tablet Take 0.25 mg by mouth daily.    Yes Historical Provider, MD  atorvastatin (LIPITOR) 20 MG tablet Take 1 tablet (20 mg total) by mouth daily at 6 PM. 04/27/14  Yes Venia Carbon, MD  bisacodyl (DULCOLAX) 5 MG EC tablet Take 10 mg  by mouth 2 (two) times daily as needed for moderate constipation.    Yes Historical Provider, MD  FLUoxetine (PROZAC) 40 MG capsule Take 1 capsule (40 mg total) by mouth daily. 04/27/14  Yes Venia Carbon, MD  peg 3350 powder (MOVIPREP) 100 G SOLR Take 1 kit by mouth once.   Yes Historical Provider, MD  Probiotic CAPS Take 1 capsule by mouth daily.   Yes Historical Provider, MD  vancomycin (VANCOCIN) 250 MG capsule Take 1 capsule (250 mg total) by mouth 4 (four) times daily. 09/15/14  Yes Willia Craze, NP  Vancomycin HCl (FIRST-VANCOMYCIN 50) 50 MG/ML SOLN Take 250 mg by mouth 4 (four) times daily.   Yes Historical Provider, MD    Current Facility-Administered Medications  Medication Dose Route Frequency Provider Last Rate Last Dose  . 0.9 %  sodium chloride infusion   Intravenous Continuous Gatha Mayer, MD        Allergies as of 10/10/2014 - Review Complete 10/06/2014  Allergen Reaction Noted  . Cefotetan Other (See Comments) 06/26/2014  . Nsaids  06/24/2014  . Codeine sulfate      Family History  Problem Relation Age of Onset  . Diabetes Mother   . Diabetes Maternal Grandmother   . Heart disease Father   . Colon cancer Neg Hx     Social History   Social History  .  Marital Status: Married    Spouse Name: N/A  . Number of Children: 2  . Years of Education: N/A   Occupational History  . 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 Smoking status: Former Smoker -- 0.25 packs/day for 30 years    Types: Cigarettes    Quit date: 06/18/2014  . Smokeless tobacco: Never Used  . Alcohol Use: No  . Drug Use: No  . Sexual Activity:    Partners: Male    Birth Control/ Protection: None   Other Topics Concern  . Not on file   Social History Narrative    Review of Systems: Positive for oral mucosa irritation on vancomycin All other review of systems negative except as mentioned in the HPI.  Physical Exam: Vital signs in last  24 hours: Temp:  [98.6 F (37 C)] 98.6 F (37 C) (08/10 1013) Pulse Rate:  [65] 65 (08/10 1013) Resp:  [15] 15 (08/10 1013) BP: (101)/(41) 101/41 mmHg (08/10 1013) SpO2:  [99 %] 99 % (08/10 1013) Weight:  [138 lb (62.596 kg)] 138 lb (62.596 kg) (08/10 1013)   General:   Alert,  Well-developed, well-nourished, pleasant and cooperative in NAD Lungs:  Clear throughout to auscultation.   Heart:  Regular rate and rhythm; no murmurs, clicks, rubs,  or gallops. Abdomen:  Soft, nontender and nondistended. Normal bowel sounds.   Neuro/Psych:  Alert and cooperative. Normal mood and affect. A and O x 3   '@Nezar Buckles'$  Simonne Maffucci, MD, [facility] [phone] (pager) 10/19/2014 10:32 AM@

## 2014-10-20 ENCOUNTER — Encounter (HOSPITAL_COMMUNITY): Payer: Self-pay | Admitting: Internal Medicine

## 2014-10-31 ENCOUNTER — Other Ambulatory Visit: Payer: BLUE CROSS/BLUE SHIELD

## 2014-10-31 ENCOUNTER — Telehealth: Payer: Self-pay | Admitting: Nurse Practitioner

## 2014-10-31 ENCOUNTER — Telehealth: Payer: Self-pay | Admitting: Gastroenterology

## 2014-10-31 DIAGNOSIS — R197 Diarrhea, unspecified: Secondary | ICD-10-CM

## 2014-10-31 NOTE — Telephone Encounter (Signed)
Patient notified She will come this am

## 2014-10-31 NOTE — Telephone Encounter (Signed)
Patient reports worsening diarrhea since FMT 2 weeks ago.  She has had 7 episodes of diarrhea this am.  She states that "c-diff odor and color"  Are back.  She reports lower abdominal pain and soreness in the abdominal area.  Bilateral lower abdominal pain and cramping since the transplant.  Dr. Carlean Purl please advise

## 2014-10-31 NOTE — Telephone Encounter (Signed)
A user error has taken place.

## 2014-10-31 NOTE — Telephone Encounter (Signed)
Sorry to hear that First step is a C diff PCR please - have her submit today

## 2014-11-01 ENCOUNTER — Telehealth: Payer: Self-pay | Admitting: Gastroenterology

## 2014-11-01 LAB — CLOSTRIDIUM DIFFICILE BY PCR: CDIFFPCR: DETECTED — AB

## 2014-11-01 MED ORDER — VANCOMYCIN HCL 125 MG PO CAPS: 125.0000 mg | ORAL_CAPSULE | Freq: Four times a day (QID) | ORAL | Status: DC

## 2014-11-01 NOTE — Progress Notes (Signed)
Quick Note:  Restart vancomycin 125 mg qid x 2 weeks Further instructions to follow Force fluids Am ccing Dr. Baxter Flattery ______

## 2014-11-01 NOTE — Telephone Encounter (Signed)
See lab results for additional details.  

## 2014-11-03 NOTE — Progress Notes (Signed)
Quick Note:  I spoke to her - no improvement yet w/ vancomycin though she says she has never had any improvement on it. Mild cramps no sig pain and did not mention fever Problems began after surgery for pyloric stenosis - ? If she could have severe dumping syndrome with C diff colonizaition.  We will Rx Lomotil 1-2 qac # 90 no refill please and she should keep f/u Dr. Baxter Flattery next week Could need colonoscopy w/ biopsies, other investigation ______

## 2014-11-04 ENCOUNTER — Other Ambulatory Visit: Payer: Self-pay

## 2014-11-04 MED ORDER — DIPHENOXYLATE-ATROPINE 2.5-0.025 MG PO TABS: ORAL_TABLET | ORAL | Status: DC

## 2014-11-10 ENCOUNTER — Encounter: Payer: Self-pay | Admitting: Internal Medicine

## 2014-11-10 ENCOUNTER — Ambulatory Visit (INDEPENDENT_AMBULATORY_CARE_PROVIDER_SITE_OTHER): Payer: BLUE CROSS/BLUE SHIELD | Admitting: Internal Medicine

## 2014-11-10 VITALS — BP 110/58 | HR 67 | Temp 97.4°F | Wt 142.0 lb

## 2014-11-10 DIAGNOSIS — A047 Enterocolitis due to Clostridium difficile: Secondary | ICD-10-CM

## 2014-11-10 DIAGNOSIS — Z23 Encounter for immunization: Secondary | ICD-10-CM

## 2014-11-10 NOTE — Progress Notes (Signed)
  Rfv: recurrent c.difficile post FMT Subjective:    Patient ID: Shelly Hood, female    DOB: December 23, 1955, 59 y.o.   MRN: [id number]  HPI 59yo F with hx of pyloric stricture s/p vagotomy/antrectomy/roux en y in April 1017, complicated by recurrent cdifficile. she recently had FMT but had large BM right after procedure on day of FMT. She had 2 days of no bowel movement. Then she has been having abdominal cramping watery diarrhea, low grade fever post FMT. Stool tested again positive for cdiff. Now has been on oral vanco for the past 7 days, with relief in the last 5-6 d. She has only pencil thin bowel movements 2-4 x per day but no diarrhea. No cramping. Good energy. 2 lb weight gain  Current Outpatient Prescriptions on File Prior to Visit  Medication Sig Dispense Refill  . acetaminophen (TYLENOL) 500 MG tablet Take 1,000 mg by mouth every 6 (six) hours as needed for mild pain or fever (pain and fever).     . ALPRAZolam (XANAX) 0.25 MG tablet Take 0.25 mg by mouth daily.     Marland Kitchen atorvastatin (LIPITOR) 20 MG tablet Take 1 tablet (20 mg total) by mouth daily at 6 PM. 90 tablet 3  . FLUoxetine (PROZAC) 40 MG capsule Take 1 capsule (40 mg total) by mouth daily. 90 capsule 3  . vancomycin (VANCOCIN HCL) 125 MG capsule Take 1 capsule (125 mg total) by mouth 4 (four) times daily. 56 capsule 0  . diphenoxylate-atropine (LOMOTIL) 2.5-0.025 MG per tablet Take 1 or 2 tablets prior to each meal as needed for diarrhea (Patient not taking: Reported on 11/10/2014) 90 tablet 0   No current facility-administered medications on file prior to visit.   Active Ambulatory Problems    Diagnosis Date Noted  . Hyperlipemia 07/30/2006  . Anxiety disorder 07/30/2006  . TOBACCO ABUSE 07/30/2006  . ALLERGIC RHINITIS 07/30/2006  . GERD 02/22/2009  . OSTEOPENIA 10/14/2006  . Obesity 04/15/2011  . 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 Thrombocytopenia 06/26/2014  . Diarrhea 09/15/2014    . Hx of Clostridium difficile infection 09/15/2014  . C. difficile colitis    Resolved Ambulatory Problems    Diagnosis Date Noted  . DEPRESSION 07/30/2006  . LIVER FUNCTION TESTS, ABNORMAL 08/22/2008  . Acute upper respiratory infections of unspecified site 03/06/2011  . Routine general medical examination at a health care facility 04/15/2011  . Viral wart 04/15/2011  . Upper respiratory infection 05/28/2013  . Abdominal pain, epigastric 11/23/2013  . Dermatofibroma of trunk 11/23/2013  . 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 Panic attacks   . High cholesterol   . Gastric outlet obstruction   . Pyloric ulcer   . Depression   . GERD (gastroesophageal reflux disease)   . Pyloric stenosis   . Clostridium difficile infection      Review of Systems 10 point ros except what is mentioned in hpi    Objective:   Physical Exam  BP 110/58 mmHg  Pulse 67  Temp(Src) 97.4 F (36.3 C) (Oral)  Wt 142 lb (64.411 kg) Did not examine      Assessment & Plan:  cdifficile = continue with finishing out course of vancomycin. i am hoping that this will be the only course she needs for resolution of recurrent cdiff, failed FMT  healht maintenance = give flu shot

## 2014-11-23 ENCOUNTER — Telehealth: Payer: Self-pay

## 2014-11-23 ENCOUNTER — Other Ambulatory Visit: Payer: BLUE CROSS/BLUE SHIELD

## 2014-11-23 ENCOUNTER — Other Ambulatory Visit: Payer: Self-pay | Admitting: Internal Medicine

## 2014-11-23 ENCOUNTER — Other Ambulatory Visit: Payer: Self-pay | Admitting: *Deleted

## 2014-11-23 DIAGNOSIS — A0472 Enterocolitis due to Clostridium difficile, not specified as recurrent: Secondary | ICD-10-CM

## 2014-11-23 DIAGNOSIS — A0471 Enterocolitis due to Clostridium difficile, recurrent: Secondary | ICD-10-CM

## 2014-11-23 DIAGNOSIS — R197 Diarrhea, unspecified: Secondary | ICD-10-CM

## 2014-11-23 MED ORDER — VANCOMYCIN HCL 125 MG PO CAPS: 125.0000 mg | ORAL_CAPSULE | Freq: Four times a day (QID) | ORAL | Status: DC

## 2014-11-23 NOTE — Telephone Encounter (Signed)
-----   Message from Carlyle Basques, MD sent at 11/23/2014  9:17 AM EDT ----- Can you have miss Holloran drop off a poop sample to test for cdiff. thanks

## 2014-11-23 NOTE — Telephone Encounter (Signed)
Patient is calling c/o finished Vancomycin 1 week ago and symptoms have returned.  Patient wonders if she should  restart medications.  Patient was advised Dr Baxter Flattery is not in the office today and she suggested calling GI for advice.   Laverle Patter, RN

## 2014-11-23 NOTE — Telephone Encounter (Signed)
Called the patient per Dr Baxter Flattery and she is coming to give a sample for testing today 11/23/14.

## 2014-11-23 NOTE — Progress Notes (Signed)
Patient had just completed 2 wk course of vanco and had stopped it roughly 1 week. She had been fine without symptoms until yesterday when she had 13 loose watery stools, associated with eating and without eating. Will reinitiate vanco treatment and testing

## 2014-11-23 NOTE — Telephone Encounter (Signed)
i spoke with the patient and got her back on vancomycin. Thanks for referring her to gi, since that makes sense to do.

## 2014-11-24 LAB — CLOSTRIDIUM DIFFICILE BY PCR: CDIFFPCR: DETECTED — AB

## 2014-11-24 NOTE — Telephone Encounter (Signed)
Patient called to see if the results were back.  RN notified her that they are still in process, no result available.  She would like a call on her cell [phone] with results once they are available. Landis Gandy, RN

## 2014-11-24 NOTE — Telephone Encounter (Signed)
Results received from [facility], Critical result - C Diff detected.  Please advise any instructions for the patient. Landis Gandy, RN

## 2014-11-25 ENCOUNTER — Emergency Department (HOSPITAL_COMMUNITY): Payer: BLUE CROSS/BLUE SHIELD

## 2014-11-25 ENCOUNTER — Emergency Department (HOSPITAL_COMMUNITY)
Admission: EM | Admit: 2014-11-25 | Discharge: 2014-11-25 | Disposition: A | Discharge status: Home / Self Care | Payer: BLUE CROSS/BLUE SHIELD | Attending: Emergency Medicine | Admitting: Emergency Medicine

## 2014-11-25 ENCOUNTER — Encounter (HOSPITAL_COMMUNITY): Payer: Self-pay | Admitting: Emergency Medicine

## 2014-11-25 DIAGNOSIS — E780 Pure hypercholesterolemia: Secondary | ICD-10-CM | POA: Insufficient documentation

## 2014-11-25 DIAGNOSIS — K654 Sclerosing mesenteritis: Secondary | ICD-10-CM | POA: Insufficient documentation

## 2014-11-25 DIAGNOSIS — Z87891 Personal history of nicotine dependence: Secondary | ICD-10-CM | POA: Insufficient documentation

## 2014-11-25 DIAGNOSIS — R1011 Right upper quadrant pain: Secondary | ICD-10-CM | POA: Diagnosis present

## 2014-11-25 DIAGNOSIS — F410 Panic disorder [episodic paroxysmal anxiety] without agoraphobia: Secondary | ICD-10-CM | POA: Insufficient documentation

## 2014-11-25 DIAGNOSIS — A047 Enterocolitis due to Clostridium difficile: Secondary | ICD-10-CM | POA: Diagnosis not present

## 2014-11-25 DIAGNOSIS — Z79899 Other long term (current) drug therapy: Secondary | ICD-10-CM | POA: Diagnosis not present

## 2014-11-25 DIAGNOSIS — E669 Obesity, unspecified: Secondary | ICD-10-CM | POA: Diagnosis not present

## 2014-11-25 DIAGNOSIS — F329 Major depressive disorder, single episode, unspecified: Secondary | ICD-10-CM | POA: Diagnosis not present

## 2014-11-25 DIAGNOSIS — A0472 Enterocolitis due to Clostridium difficile, not specified as recurrent: Secondary | ICD-10-CM

## 2014-11-25 DIAGNOSIS — M793 Panniculitis, unspecified: Secondary | ICD-10-CM

## 2014-11-25 LAB — LIPASE, BLOOD: Lipase: 24 U/L (ref 22–51)

## 2014-11-25 LAB — CBC WITH DIFFERENTIAL/PLATELET
BASOS PCT: 0 %
Basophils Absolute: 0 10*3/uL (ref 0.0–0.1)
Eosinophils Absolute: 0.2 10*3/uL (ref 0.0–0.7)
Eosinophils Relative: 2 %
HCT: 36.4 % (ref 36.0–46.0)
Hemoglobin: 12.1 g/dL (ref 12.0–15.0)
Lymphocytes Relative: 23 %
Lymphs Abs: 1.7 10*3/uL (ref 0.7–4.0)
MCH: 27.3 pg (ref 26.0–34.0)
MCHC: 33.2 g/dL (ref 30.0–36.0)
MCV: 82 fL (ref 78.0–100.0)
MONO ABS: 0.6 10*3/uL (ref 0.1–1.0)
MONOS PCT: 8 %
NEUTROS PCT: 67 %
Neutro Abs: 4.9 10*3/uL (ref 1.7–7.7)
Platelets: 287 10*3/uL (ref 150–400)
RBC: 4.44 MIL/uL (ref 3.87–5.11)
RDW: 14.4 % (ref 11.5–15.5)
WBC: 7.4 10*3/uL (ref 4.0–10.5)

## 2014-11-25 LAB — COMPREHENSIVE METABOLIC PANEL
ALBUMIN: 3.4 g/dL — AB (ref 3.5–5.0)
ALT: 48 U/L (ref 14–54)
ANION GAP: 9 (ref 5–15)
AST: 39 U/L (ref 15–41)
Alkaline Phosphatase: 114 U/L (ref 38–126)
BUN: 7 mg/dL (ref 6–20)
CHLORIDE: 105 mmol/L (ref 101–111)
CO2: 25 mmol/L (ref 22–32)
Calcium: 9.1 mg/dL (ref 8.9–10.3)
Creatinine, Ser: 0.55 mg/dL (ref 0.44–1.00)
GFR calc Af Amer: >60 mL/min (ref >60)
GFR calc non Af Amer: >60 mL/min (ref >60)
Glucose, Bld: 102 mg/dL — ABNORMAL HIGH (ref 65–99)
POTASSIUM: 4.3 mmol/L (ref 3.5–5.1)
SODIUM: 139 mmol/L (ref 135–145)
Total Bilirubin: 0.4 mg/dL (ref 0.3–1.2)
Total Protein: 6.9 g/dL (ref 6.5–8.1)

## 2014-11-25 LAB — URINALYSIS, ROUTINE W REFLEX MICROSCOPIC
Bilirubin Urine: NEGATIVE
Glucose, UA: NEGATIVE mg/dL
HGB URINE DIPSTICK: NEGATIVE
Ketones, ur: NEGATIVE mg/dL
LEUKOCYTES UA: NEGATIVE
NITRITE: NEGATIVE
PROTEIN: NEGATIVE mg/dL
SPECIFIC GRAVITY, URINE: 1.022 (ref 1.005–1.030)
UROBILINOGEN UA: 0.2 mg/dL (ref 0.0–1.0)
pH: 6.5 (ref 5.0–8.0)

## 2014-11-25 LAB — POC OCCULT BLOOD, ED: FECAL OCCULT BLD: NEGATIVE

## 2014-11-25 MED ORDER — IOHEXOL 300 MG/ML  SOLN
100.0000 mL | Freq: Once | INTRAMUSCULAR | Status: AC | PRN
  Administered 2014-11-25: 100 mL via INTRAVENOUS

## 2014-11-25 MED ORDER — ONDANSETRON HCL 4 MG/2ML IJ SOLN
4.0000 mg | Freq: Once | INTRAMUSCULAR | Status: AC
  Administered 2014-11-25: 4 mg via INTRAVENOUS
  Filled 2014-11-25: qty 2

## 2014-11-25 MED ORDER — MORPHINE SULFATE (PF) 4 MG/ML IV SOLN
4.0000 mg | Freq: Once | INTRAVENOUS | Status: AC
  Administered 2014-11-25: 4 mg via INTRAVENOUS
  Filled 2014-11-25: qty 1

## 2014-11-25 NOTE — Discharge Instructions (Signed)
You have some inflammation seen in the fat next to the colon.  This is a nonspecific change.  I have discussed your care with Dr. Lynne Leader office-  They advise to call Monday if pain continues for recheck.  Return if pain worsens, fever, or unable to keep down medicine.

## 2014-11-25 NOTE — Telephone Encounter (Signed)
She was in the ED for abdominal pain today 11/25/14.

## 2014-11-25 NOTE — ED Provider Notes (Signed)
CSN: [id number]     Arrival date & time 11/25/14  3818 History   First MD Initiated Contact with Patient 11/25/14 [phone]     Chief Complaint  Patient presents with  . Abdominal Pain  . Diarrhea     (Consider location/radiation/quality/duration/timing/severity/associated sxs/prior Treatment) HPI 59 year old female presents today complaining of right upper quadrant pain. She has had a complicated course post surgery in April of this year. She has had C. difficile and has been having ongoing treatment. 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 She states that they attempted to take her off of the vancomycin for a week and her symptoms came back 3 days ago this past Tuesday. She had up to 17 episodes of diarrhea at that time. She had a C. difficile repeated that was positive. She restarted on her vancomycin on Wednesday. The diarrhea decreased down to about 4 episodes a day since that time. On Wednesday she began having some right upper quadrant pain. It waxed and waned until last night when it became constant. She describes it as sharp in nature. Does not have associated nausea, vomiting, urinary frequency, hematuria, back pain or shoulder pain, or diarrhea. Previously with a C. difficile she has had some crampy pain but this is different than any problems she has had the C. difficile. She has her gallbladder has not had any history of gallbladder problems. She has been eating without difficulty. She ate fried foods last night. She has not had anything by mouth daily. Past Medical History  Diagnosis Date  . Panic attacks   . High cholesterol   . Obesity   . Gastric outlet obstruction   . Pyloric ulcer   . Depression   . GERD (gastroesophageal reflux disease)   . Pyloric stenosis   . Clostridium difficile infection    Past Surgical History  Procedure Laterality Date  . Rhinoplasty      X3  . Colonscopy  AGE 61  . Vagotomy N/A 06/24/2014    Procedure: ROBOTIC TRUNCAL VAGOTOMY ;   Surgeon: Michael Boston, MD;  Location: [facility];  Service: General;  Laterality: N/A;  This is [name]'s preference  . Antrectomy N/A 06/24/2014    Procedure: ROBOTIC DISTAL GASTRECTOMY;  Surgeon: Michael Boston, MD;  Location: [facility];  Service: General;  Laterality: N/A;  . Laparoscopic roux-en-y gastric bypass with hiatal hernia repair N/A 06/24/2014    Procedure: ROBOTIC ROUX-EN-Y CONSTRUCTION WITH HIATAL HERNIA REPAIR;  Surgeon: Michael Boston, MD;  Location: [facility];  Service: General;  Laterality: N/A;  . Colonoscopy N/A 10/19/2014    Procedure: COLONOSCOPY;  Surgeon: Gatha Mayer, MD;  Location: [place];  Service: Endoscopy;  Laterality: N/A;  with FMT  . Fecal transplant N/A 10/19/2014    Procedure: FECAL TRANSPLANT;  Surgeon: Gatha Mayer, MD;  Location: [place];  Service: Endoscopy;  Laterality: N/A;   Family History  Problem Relation Age of Onset  . Diabetes Mother   . Diabetes Maternal Grandmother   . Heart disease Father   . Colon cancer Neg Hx    Social History  Substance Use Topics  . Smoking status: Former Smoker -- 0.25 packs/day for 30 years    Types: Cigarettes    Quit date: 06/18/2014  . Smokeless tobacco: Never Used  . Alcohol Use: No   OB History    No data available     Review of Systems  All other systems reviewed and are negative.     Allergies  Cefotetan; Nsaids; Codeine sulfate; and Flagyl  Home Medications   Prior to Admission medications   Medication Sig Start Date End Date Taking? Authorizing Provider  acetaminophen (TYLENOL) 500 MG tablet Take 1,000 mg by mouth every 6 (six) hours as needed for mild pain or fever (pain and fever).     Historical Provider, MD  ALPRAZolam Duanne Moron) 0.25 MG tablet Take 0.25 mg by mouth daily.     Historical Provider, MD  atorvastatin (LIPITOR) 20 MG tablet Take 1 tablet (20 mg total) by mouth daily at 6 PM. 04/27/14   Venia Carbon, MD  diphenoxylate-atropine (LOMOTIL) 2.5-0.025 MG per tablet Take 1 or 2  tablets prior to each meal as needed for diarrhea Patient not taking: Reported on 11/10/2014 11/04/14   Gatha Mayer, MD  FLUoxetine (PROZAC) 40 MG capsule Take 1 capsule (40 mg total) by mouth daily. 04/27/14   Venia Carbon, MD  vancomycin (VANCOCIN HCL) 125 MG capsule Take 1 capsule (125 mg total) by mouth 4 (four) times daily. 11/23/14   Carlyle Basques, MD   BP 139/60 mmHg  Pulse 79  Temp(Src) 98.7 F (37.1 C) (Oral)  Resp 18  Ht 4\' 11"  (1.499 m)  Wt 135 lb (61.236 kg)  BMI 27.25 kg/m2  SpO2 99% Physical Exam  Constitutional: She is oriented to person, place, and time. She appears well-developed and well-nourished.  HENT:  Head: Normocephalic and atraumatic.  Right Ear: External ear normal.  Left Ear: External ear normal.  Nose: Nose normal.  Mouth/Throat: Oropharynx is clear and moist.  Eyes: Conjunctivae and EOM are normal. Pupils are equal, round, and reactive to light.  Neck: Normal range of motion. Neck supple.  Cardiovascular: Normal rate, regular rhythm, normal heart sounds and intact distal pulses.   Pulmonary/Chest: Effort normal and breath sounds normal.  Abdominal: Soft. Bowel sounds are normal. There is tenderness.    Musculoskeletal: Normal range of motion.  Neurological: She is alert and oriented to person, place, and time. She has normal reflexes.  Skin: Skin is warm and dry.  Psychiatric: She has a normal mood and affect. Her behavior is normal. Judgment and thought content normal.  Nursing note and vitals reviewed.   ED Course  Procedures (including critical care time) Labs Review Labs Reviewed  COMPREHENSIVE METABOLIC PANEL - Abnormal; Notable for the following:    Glucose, Bld 102 (*)    Albumin 3.4 (*)    All other components within normal limits  CBC WITH DIFFERENTIAL/PLATELET  LIPASE, BLOOD  URINALYSIS, ROUTINE W REFLEX MICROSCOPIC (NOT AT [facility])  OCCULT BLOOD X 1 CARD TO LAB, STOOL  POC OCCULT BLOOD, ED  POC OCCULT BLOOD, ED    Imaging  Review US Abdomen Complete  11/25/2014   CLINICAL DATA:  Right lower quadrant pain 2 days.  EXAM: ULTRASOUND ABDOMEN COMPLETE  COMPARISON:  None.  FINDINGS: Gallbladder: No gallstones or wall thickening visualized. No sonographic Murphy sign noted.  Common bile duct: Diameter: 3.0 mm.  Liver: 1.9 cm echogenic focus within the central liver likely a small hemangioma or focal fatty change.  IVC: No abnormality visualized.  Pancreas: Visualized portion unremarkable.  Spleen: Size and appearance within normal limits.  Right Kidney: Length: 10.8 cm. Echogenicity within normal limits. No mass or hydronephrosis visualized.  Left Kidney: Length: Lower limits of normal in size measuring 8.8 cm. Echogenicity within normal limits. No mass or hydronephrosis visualized.  Abdominal aorta: No aneurysm visualized.  Other findings: None.  IMPRESSION: No acute hepatobiliary disease.  Focal echogenic center liver lesion measuring 1.9 cm likely a hemangioma versus focal fatty change.   Electronically Signed   By: Marin Olp M.D.   On: 11/25/2014 11:49   Ct Abdomen Pelvis W Contrast  11/25/2014   CLINICAL DATA:  Right upper quadrant pain for 3 4 days, Clostridium difficile infection for 5 weeks  EXAM: CT ABDOMEN AND PELVIS WITH CONTRAST  TECHNIQUE: Multidetector CT imaging of the abdomen and pelvis was performed using the standard protocol following bolus administration of intravenous contrast.  CONTRAST:  147mL OMNIPAQUE IOHEXOL 300 MG/ML  SOLN  COMPARISON:  None.  FINDINGS: Lower chest:  No significant opacities at the lung bases  Hepatobiliary: Negative  Pancreas: Negative  Spleen: Negative  Adrenals/Urinary Tract: Small nonobstructing calculi right kidney. Scarring upper pole left kidney.  Stomach/Bowel: Status post gastric bypass. Nonobstructive gas pattern. No significant smaller large bowel wall thickening. Gastrojejunostomy noted. Moderate inflammation in the vicinity of the hepatic flexure of the colon, primarily  located in the abdominal fat immediately anterior and primarily cranial to the flexure. No evidence of pneumatosis or pneumoperitoneum.  Vascular/Lymphatic: Mild aortic calcification.  Reproductive: No significant abnormalities  Other: No ascites  Musculoskeletal: negative  IMPRESSION: Acute inflammatory change in the fat medially anterior to the hepatic flexure of the colon. Differential diagnostic possibilities include postoperative fat necrosis, epiploic appendagitis, focal colitis, or mass, which is less likely.   Electronically Signed   By: Skipper Cliche M.D.   On: 11/25/2014 14:29   I have personally reviewed and evaluated these images and lab results as part of my medical decision-making.   EKG Interpretation None      MDM   Final diagnoses:  RUQ pain  C. difficile colitis  Fat inflammation   Say 59 year old lady with a history of C. difficile. She comes in today complaining that she is having some sharp right upper quadrant pain. Has not been associated with her nausea or diarrhea. She has been taking by mouth without difficulty. Her stooling has been controlled well over the past several days on restarted vancomycin. Here she has some tenderness palpation over her ribs and under her ribs on the right upper quadrant. She appears hemodynamically stable. Labs were obtained here and are all within normal limits with the exception of albumen that is slightly low at 3.4 consistent with her recent difficulties with C. difficile. An ultra Sound was obtained and shows no evidence of acute abnormality except for a 1.9 cm lesion likely representing hemangioma. I have discussed treatment options with the patient. We have discussed proceeding with CT scan versus follow-up. I discussed the risks of the CT scan with the patient and her family including reaction to dye, false positives, had radiation dosage. She is here with her family and they're very supportive. She has good transportation. She also has  a primary care doctor and gastroenterologist. We also discussed the liver lesion is 1.9 cm and the need for her to be aware of this and make her physician is aware for any follow-up.  Patient and family decided that she should have a CT scan. He obtained and does some inflammatory changes in the fat medially anterior to the hepatic flexure consistent with her pain is. I discussed with Belia Heman on call for Dr. Fuller Plan. Currently planned for her to continue her antibiotics at home and call for recheck next week. Return precautions given and need for follow-up discussed and patient and family voice understanding.  Pattricia Boss, MD 11/25/14 [phone]

## 2014-11-25 NOTE — ED Notes (Signed)
Pt called out with additional questions for MD.  MD at bedside at this time.

## 2014-11-25 NOTE — ED Notes (Signed)
Pt able to ambulate independently, gait steady and even.

## 2014-11-25 NOTE — ED Notes (Signed)
MD at bedside. 

## 2014-11-25 NOTE — ED Notes (Signed)
Pt c/o abdominal pain with diarrhea. Pt is currently being treated for C-diff for past 5 months.

## 2014-11-25 NOTE — ED Notes (Signed)
Patient transported to CT 

## 2014-11-25 NOTE — Telephone Encounter (Signed)
i had already prescribed oral vancomycin so no need for further tx rx to call in. Can you call the patient to see if she has noticed any improvement since starting oral vanco

## 2014-11-25 NOTE — ED Notes (Signed)
Pt does not want additional pain medication at this time.

## 2014-11-25 NOTE — ED Notes (Signed)
Patient transported to Ultrasound 

## 2014-11-27 LAB — STOOL CULTURE

## 2014-11-28 ENCOUNTER — Telehealth: Payer: Self-pay | Admitting: *Deleted

## 2014-11-28 ENCOUNTER — Telehealth: Payer: Self-pay

## 2014-11-28 ENCOUNTER — Telehealth: Payer: Self-pay | Admitting: Gastroenterology

## 2014-11-28 NOTE — Telephone Encounter (Signed)
Patient was in the ER on Friday for severe RUQ pain and c-diff.  She had a CT that showed inflammation in the tissues surrounding her liver.  She will come in and see Dr. Fuller Plan tomorrow at 11:00

## 2014-11-28 NOTE — Telephone Encounter (Signed)
Spoke with husband and he states pt has appt with Dr. Fuller Plan tomorrow at [place], pt was on her way to work. I called her work number first she wasn't there yet.

## 2014-11-28 NOTE — Telephone Encounter (Signed)
Patient calling for follow up visit .  She has started the vancomycin. She was in the ED on Friday and xrays were done and pateint is concerned about results.  She is unsure who to follow at this point.  Abnormal CT of the abdomen and pelvis.  I advised patient to call GI and continue the vancomycin.   Laverle Patter, RN

## 2014-11-29 ENCOUNTER — Telehealth: Payer: Self-pay | Admitting: Gastroenterology

## 2014-11-29 ENCOUNTER — Ambulatory Visit: Payer: BLUE CROSS/BLUE SHIELD | Admitting: Gastroenterology

## 2014-11-29 NOTE — Telephone Encounter (Signed)
Spoke with patient and she was in tears, pt would like to see another GI dr, per pt she's been going thru C-diff for a while and nothing is being done, she's on medication and once she stops all the symptoms come back. Pt is asking for a referral to [facility] or [facility], please advise

## 2014-11-29 NOTE — Telephone Encounter (Signed)
I spoke with the patient and she does not wish to keep the appt for today. She was very pleasant on the phone, but does not wish to keep the appt.   I advised her that the CT scan was abnormal in the ER and she should be evaluated as they recommended.  She stated "I do not know what to do"  and is tearful, she does not want to come in and be seen.  She has asked her primary for a referral to anther GI.  I did advise her that Dr. Baxter Flattery also recommended she be seen, but she again stated "I don't know what to do".  She did not want to keep the appt.  There is a phone note in the system that she asked Dr. Silvio Pate for a referral to [facility] or [facility].  See  Phone note from yesterday.

## 2014-11-29 NOTE — Telephone Encounter (Signed)
Please see if you can set her up at either of these places

## 2014-11-29 NOTE — Telephone Encounter (Signed)
If she prefers to see CG and she wants to change to him that is fine. An evaluation at [facility] or [facility] ID or GI dept is a very good idea. Dr. Silvio Pate can proceed with the referral as requested.

## 2014-11-30 ENCOUNTER — Inpatient Hospital Stay (HOSPITAL_COMMUNITY)
Admission: EM | Admit: 2014-11-30 | Discharge: 2014-12-10 | DRG: 871 | Disposition: A | Discharge status: Home Health | Payer: BLUE CROSS/BLUE SHIELD | Attending: Internal Medicine | Admitting: Internal Medicine

## 2014-11-30 ENCOUNTER — Emergency Department (HOSPITAL_COMMUNITY): Payer: BLUE CROSS/BLUE SHIELD

## 2014-11-30 ENCOUNTER — Encounter (HOSPITAL_COMMUNITY): Payer: Self-pay | Admitting: Emergency Medicine

## 2014-11-30 DIAGNOSIS — J9601 Acute respiratory failure with hypoxia: Secondary | ICD-10-CM | POA: Diagnosis not present

## 2014-11-30 DIAGNOSIS — Z9889 Other specified postprocedural states: Secondary | ICD-10-CM

## 2014-11-30 DIAGNOSIS — F413 Other mixed anxiety disorders: Secondary | ICD-10-CM | POA: Diagnosis not present

## 2014-11-30 DIAGNOSIS — D72829 Elevated white blood cell count, unspecified: Secondary | ICD-10-CM | POA: Diagnosis not present

## 2014-11-30 DIAGNOSIS — F17200 Nicotine dependence, unspecified, uncomplicated: Secondary | ICD-10-CM | POA: Diagnosis present

## 2014-11-30 DIAGNOSIS — R740 Nonspecific elevation of levels of transaminase and lactic acid dehydrogenase [LDH]: Secondary | ICD-10-CM | POA: Diagnosis not present

## 2014-11-30 DIAGNOSIS — Z6828 Body mass index (BMI) 28.0-28.9, adult: Secondary | ICD-10-CM

## 2014-11-30 DIAGNOSIS — A0471 Enterocolitis due to Clostridium difficile, recurrent: Secondary | ICD-10-CM

## 2014-11-30 DIAGNOSIS — E876 Hypokalemia: Secondary | ICD-10-CM | POA: Diagnosis not present

## 2014-11-30 DIAGNOSIS — R7989 Other specified abnormal findings of blood chemistry: Secondary | ICD-10-CM

## 2014-11-30 DIAGNOSIS — K219 Gastro-esophageal reflux disease without esophagitis: Secondary | ICD-10-CM | POA: Diagnosis present

## 2014-11-30 DIAGNOSIS — R630 Anorexia: Secondary | ICD-10-CM | POA: Diagnosis present

## 2014-11-30 DIAGNOSIS — Z87891 Personal history of nicotine dependence: Secondary | ICD-10-CM

## 2014-11-30 DIAGNOSIS — IMO0002 Reserved for concepts with insufficient information to code with codable children: Secondary | ICD-10-CM | POA: Diagnosis present

## 2014-11-30 DIAGNOSIS — R109 Unspecified abdominal pain: Secondary | ICD-10-CM

## 2014-11-30 DIAGNOSIS — Z792 Long term (current) use of antibiotics: Secondary | ICD-10-CM

## 2014-11-30 DIAGNOSIS — A419 Sepsis, unspecified organism: Principal | ICD-10-CM | POA: Diagnosis present

## 2014-11-30 DIAGNOSIS — K279 Peptic ulcer, site unspecified, unspecified as acute or chronic, without hemorrhage or perforation: Secondary | ICD-10-CM | POA: Diagnosis not present

## 2014-11-30 DIAGNOSIS — F329 Major depressive disorder, single episode, unspecified: Secondary | ICD-10-CM | POA: Diagnosis present

## 2014-11-30 DIAGNOSIS — Z720 Tobacco use: Secondary | ICD-10-CM

## 2014-11-30 DIAGNOSIS — E43 Unspecified severe protein-calorie malnutrition: Secondary | ICD-10-CM | POA: Diagnosis present

## 2014-11-30 DIAGNOSIS — B370 Candidal stomatitis: Secondary | ICD-10-CM | POA: Diagnosis not present

## 2014-11-30 DIAGNOSIS — J948 Other specified pleural conditions: Secondary | ICD-10-CM | POA: Diagnosis not present

## 2014-11-30 DIAGNOSIS — J90 Pleural effusion, not elsewhere classified: Secondary | ICD-10-CM | POA: Diagnosis not present

## 2014-11-30 DIAGNOSIS — Z8711 Personal history of peptic ulcer disease: Secondary | ICD-10-CM

## 2014-11-30 DIAGNOSIS — Z79899 Other long term (current) drug therapy: Secondary | ICD-10-CM

## 2014-11-30 DIAGNOSIS — K750 Abscess of liver: Secondary | ICD-10-CM | POA: Diagnosis present

## 2014-11-30 DIAGNOSIS — K651 Peritoneal abscess: Secondary | ICD-10-CM | POA: Diagnosis not present

## 2014-11-30 DIAGNOSIS — R0600 Dyspnea, unspecified: Secondary | ICD-10-CM | POA: Diagnosis not present

## 2014-11-30 DIAGNOSIS — F419 Anxiety disorder, unspecified: Secondary | ICD-10-CM | POA: Diagnosis present

## 2014-11-30 DIAGNOSIS — R197 Diarrhea, unspecified: Secondary | ICD-10-CM | POA: Diagnosis not present

## 2014-11-30 DIAGNOSIS — E440 Moderate protein-calorie malnutrition: Secondary | ICD-10-CM | POA: Diagnosis not present

## 2014-11-30 DIAGNOSIS — R1011 Right upper quadrant pain: Secondary | ICD-10-CM

## 2014-11-30 DIAGNOSIS — E7800 Pure hypercholesterolemia, unspecified: Secondary | ICD-10-CM | POA: Diagnosis present

## 2014-11-30 DIAGNOSIS — R509 Fever, unspecified: Secondary | ICD-10-CM

## 2014-11-30 DIAGNOSIS — K9189 Other postprocedural complications and disorders of digestive system: Secondary | ICD-10-CM

## 2014-11-30 DIAGNOSIS — R0902 Hypoxemia: Secondary | ICD-10-CM | POA: Diagnosis present

## 2014-11-30 DIAGNOSIS — R933 Abnormal findings on diagnostic imaging of other parts of digestive tract: Secondary | ICD-10-CM | POA: Diagnosis not present

## 2014-11-30 DIAGNOSIS — K2970 Gastritis, unspecified, without bleeding: Secondary | ICD-10-CM | POA: Diagnosis not present

## 2014-11-30 DIAGNOSIS — R0602 Shortness of breath: Secondary | ICD-10-CM

## 2014-11-30 DIAGNOSIS — K311 Adult hypertrophic pyloric stenosis: Secondary | ICD-10-CM | POA: Diagnosis present

## 2014-11-30 DIAGNOSIS — R188 Other ascites: Secondary | ICD-10-CM

## 2014-11-30 DIAGNOSIS — J9811 Atelectasis: Secondary | ICD-10-CM | POA: Diagnosis not present

## 2014-11-30 DIAGNOSIS — A047 Enterocolitis due to Clostridium difficile: Secondary | ICD-10-CM | POA: Diagnosis not present

## 2014-11-30 DIAGNOSIS — Z903 Acquired absence of stomach [part of]: Secondary | ICD-10-CM

## 2014-11-30 DIAGNOSIS — R945 Abnormal results of liver function studies: Secondary | ICD-10-CM

## 2014-11-30 DIAGNOSIS — A0472 Enterocolitis due to Clostridium difficile, not specified as recurrent: Secondary | ICD-10-CM | POA: Diagnosis present

## 2014-11-30 HISTORY — DX: Enterocolitis due to Clostridium difficile, not specified as recurrent: A04.72

## 2014-11-30 HISTORY — DX: Thrombocytopenia, unspecified: D69.6

## 2014-11-30 HISTORY — DX: Peptic ulcer, site unspecified, unspecified as acute or chronic, without hemorrhage or perforation: K27.9

## 2014-11-30 LAB — COMPREHENSIVE METABOLIC PANEL
ALK PHOS: 131 U/L — AB (ref 38–126)
ALT: 59 U/L — AB (ref 14–54)
ANION GAP: 10 (ref 5–15)
AST: 48 U/L — ABNORMAL HIGH (ref 15–41)
Albumin: 3.5 g/dL (ref 3.5–5.0)
BILIRUBIN TOTAL: 0.3 mg/dL (ref 0.3–1.2)
BUN: 16 mg/dL (ref 6–20)
CALCIUM: 9.2 mg/dL (ref 8.9–10.3)
CO2: 27 mmol/L (ref 22–32)
CREATININE: 0.72 mg/dL (ref 0.44–1.00)
Chloride: 103 mmol/L (ref 101–111)
GFR calc non Af Amer: >60 mL/min (ref >60)
Glucose, Bld: 160 mg/dL — ABNORMAL HIGH (ref 65–99)
Potassium: 4.1 mmol/L (ref 3.5–5.1)
Sodium: 140 mmol/L (ref 135–145)
TOTAL PROTEIN: 7.2 g/dL (ref 6.5–8.1)

## 2014-11-30 LAB — CBC
HCT: 41.5 % (ref 36.0–46.0)
HEMATOCRIT: 37.9 % (ref 36.0–46.0)
HEMOGLOBIN: 13.5 g/dL (ref 12.0–15.0)
Hemoglobin: 12.3 g/dL (ref 12.0–15.0)
MCH: 27.2 pg (ref 26.0–34.0)
MCH: 27 pg (ref 26.0–34.0)
MCHC: 32.5 g/dL (ref 30.0–36.0)
MCHC: 32.5 g/dL (ref 30.0–36.0)
MCV: 83.7 fL (ref 78.0–100.0)
MCV: 83.1 fL (ref 78.0–100.0)
PLATELETS: 359 10*3/uL (ref 150–400)
PLATELETS: 290 10*3/uL (ref 150–400)
RBC: 4.96 MIL/uL (ref 3.87–5.11)
RBC: 4.56 MIL/uL (ref 3.87–5.11)
RDW: 14.2 % (ref 11.5–15.5)
RDW: 14.4 % (ref 11.5–15.5)
WBC: 14.6 10*3/uL — ABNORMAL HIGH (ref 4.0–10.5)
WBC: 13.6 10*3/uL — AB (ref 4.0–10.5)

## 2014-11-30 LAB — URINALYSIS, ROUTINE W REFLEX MICROSCOPIC
Bilirubin Urine: NEGATIVE
GLUCOSE, UA: NEGATIVE mg/dL
HGB URINE DIPSTICK: NEGATIVE
KETONES UR: NEGATIVE mg/dL
LEUKOCYTES UA: NEGATIVE
Nitrite: NEGATIVE
PROTEIN: NEGATIVE mg/dL
Specific Gravity, Urine: 1.02 (ref 1.005–1.030)
UROBILINOGEN UA: 0.2 mg/dL (ref 0.0–1.0)
pH: 7 (ref 5.0–8.0)

## 2014-11-30 LAB — CREATININE, SERUM
Creatinine, Ser: 0.6 mg/dL (ref 0.44–1.00)
GFR calc non Af Amer: >60 mL/min (ref >60)

## 2014-11-30 LAB — I-STAT CG4 LACTIC ACID, ED: LACTIC ACID, VENOUS: 1.11 mmol/L (ref 0.5–2.0)

## 2014-11-30 LAB — LIPASE, BLOOD: Lipase: 45 U/L (ref 22–51)

## 2014-11-30 MED ORDER — ONDANSETRON HCL 4 MG/2ML IJ SOLN
8.0000 mg | Freq: Three times a day (TID) | INTRAMUSCULAR | Status: DC
  Administered 2014-12-01 – 2014-12-02 (×4): 8 mg via INTRAVENOUS
  Filled 2014-11-30 (×5): qty 4

## 2014-11-30 MED ORDER — ONDANSETRON HCL 4 MG PO TABS
4.0000 mg | ORAL_TABLET | Freq: Every day | ORAL | Status: DC
  Filled 2014-11-30 (×2): qty 1

## 2014-11-30 MED ORDER — HYDROMORPHONE HCL 1 MG/ML IJ SOLN
1.0000 mg | Freq: Once | INTRAMUSCULAR | Status: AC
  Administered 2014-11-30: 1 mg via INTRAVENOUS
  Filled 2014-11-30: qty 1

## 2014-11-30 MED ORDER — SODIUM CHLORIDE 0.9 % IV BOLUS (SEPSIS)
1000.0000 mL | Freq: Once | INTRAVENOUS | Status: AC
  Administered 2014-11-30: 1000 mL via INTRAVENOUS

## 2014-11-30 MED ORDER — HYDROMORPHONE HCL 1 MG/ML IJ SOLN
1.0000 mg | INTRAMUSCULAR | Status: DC | PRN
  Filled 2014-11-30: qty 1

## 2014-11-30 MED ORDER — ONDANSETRON HCL 4 MG PO TABS: 4.0000 mg | ORAL_TABLET | Freq: Four times a day (QID) | ORAL | Status: DC | PRN

## 2014-11-30 MED ORDER — ONDANSETRON HCL 4 MG/2ML IJ SOLN
4.0000 mg | Freq: Once | INTRAMUSCULAR | Status: AC
  Administered 2014-11-30: 4 mg via INTRAVENOUS
  Filled 2014-11-30: qty 2

## 2014-11-30 MED ORDER — VANCOMYCIN 50 MG/ML ORAL SOLUTION: 500.0000 mg | Freq: Four times a day (QID) | ORAL | Status: DC

## 2014-11-30 MED ORDER — ENOXAPARIN SODIUM 40 MG/0.4ML ~~LOC~~ SOLN
40.0000 mg | SUBCUTANEOUS | Status: DC
Start: 2014-11-30 — End: 2014-12-01
  Administered 2014-11-30: 40 mg via SUBCUTANEOUS
  Filled 2014-11-30 (×2): qty 0.4

## 2014-11-30 MED ORDER — ONDANSETRON HCL 4 MG PO TABS: 4.0000 mg | ORAL_TABLET | Freq: Four times a day (QID) | ORAL | Status: DC

## 2014-11-30 MED ORDER — SODIUM CHLORIDE 0.9 % IV SOLN
INTRAVENOUS | Status: DC
  Administered 2014-11-30 – 2014-12-04 (×9): via INTRAVENOUS

## 2014-11-30 MED ORDER — ONDANSETRON HCL 4 MG/2ML IJ SOLN
4.0000 mg | Freq: Every day | INTRAMUSCULAR | Status: DC
  Administered 2014-11-30: 4 mg via INTRAVENOUS
  Filled 2014-11-30: qty 2

## 2014-11-30 MED ORDER — ONDANSETRON HCL 4 MG/2ML IJ SOLN
4.0000 mg | Freq: Four times a day (QID) | INTRAMUSCULAR | Status: DC
  Administered 2014-11-30: 4 mg via INTRAVENOUS
  Filled 2014-11-30: qty 2

## 2014-11-30 MED ORDER — PROMETHAZINE HCL 25 MG/ML IJ SOLN
25.0000 mg | Freq: Four times a day (QID) | INTRAMUSCULAR | Status: DC | PRN
  Administered 2014-11-30: 25 mg via INTRAVENOUS
  Filled 2014-11-30: qty 1

## 2014-11-30 MED ORDER — ONDANSETRON HCL 4 MG/2ML IJ SOLN
4.0000 mg | Freq: Four times a day (QID) | INTRAMUSCULAR | Status: DC | PRN
  Administered 2014-11-30: 4 mg via INTRAVENOUS
  Filled 2014-11-30: qty 2

## 2014-11-30 MED ORDER — METRONIDAZOLE IN NACL 5-0.79 MG/ML-% IV SOLN
500.0000 mg | Freq: Three times a day (TID) | INTRAVENOUS | Status: DC
  Filled 2014-11-30 (×2): qty 100

## 2014-11-30 MED ORDER — METRONIDAZOLE IN NACL 5-0.79 MG/ML-% IV SOLN
500.0000 mg | Freq: Once | INTRAVENOUS | Status: AC
  Administered 2014-11-30: 500 mg via INTRAVENOUS
  Filled 2014-11-30: qty 100

## 2014-11-30 MED ORDER — FIDAXOMICIN 200 MG PO TABS
200.0000 mg | ORAL_TABLET | Freq: Two times a day (BID) | ORAL | Status: DC
  Administered 2014-11-30: 200 mg via ORAL
  Filled 2014-11-30 (×5): qty 1

## 2014-11-30 MED ORDER — SACCHAROMYCES BOULARDII 250 MG PO CAPS
250.0000 mg | ORAL_CAPSULE | Freq: Two times a day (BID) | ORAL | Status: DC
  Administered 2014-11-30: 250 mg via ORAL
  Filled 2014-11-30 (×3): qty 1

## 2014-11-30 MED ORDER — BOOST / RESOURCE BREEZE PO LIQD
1.0000 | Freq: Three times a day (TID) | ORAL | Status: DC
  Administered 2014-12-01: 1 via ORAL

## 2014-11-30 NOTE — ED Provider Notes (Signed)
CSN: [id number]     Arrival date & time 11/30/14  0100 History   First MD Initiated Contact with Patient 11/30/14 0325     Chief Complaint  Patient presents with  . 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 Patient states that she has had similar pain since evaluation 5 days ago. Patient notes that pain acutely worsened at 2100 yesterday. She describes the pain as sharp and burning. She states that the pain has made it hard for her to take a deep breath. Pain radiates to her back and to her right shoulder. Patient has continued to have loose bowel movements. She reports 5 episodes of watery diarrhea today. Patient has had no nausea or vomiting. She is currently on oral vancomycin and states that she has been compliant with this regimen. Patient had outpatient follow-up with Dr. Fuller Plan of gastroenterology scheduled for today, but she neglected to go to the appointment because she needed to go to work. Patient denies fever, chest pain, urinary symptoms, or vaginal complaints.  Patient is a 59 y.o. female presenting with abdominal pain. The history is provided by the patient. No language interpreter was used.  Abdominal Pain Associated symptoms: diarrhea and shortness of breath   Associated symptoms: no fever, no nausea and no vomiting     Past Medical History  Diagnosis Date  . Panic attacks   . High cholesterol   . Obesity   . Gastric outlet obstruction   . Pyloric ulcer   . Depression   . GERD (gastroesophageal reflux disease)   . Pyloric stenosis   . Clostridium difficile infection     Past Surgical History  Procedure Laterality Date  . Rhinoplasty      X3  . Colonscopy  AGE 48  . Vagotomy N/A 06/24/2014    Procedure: ROBOTIC TRUNCAL VAGOTOMY ;  Surgeon: Michael Boston, MD;  Location: [facility];  Service: General;  Laterality: N/A;  This is [name]'s preference  . Antrectomy N/A 06/24/2014    Procedure: ROBOTIC DISTAL GASTRECTOMY;  Surgeon: Michael Boston, MD;  Location: [facility];  Service: General;  Laterality: N/A;  . Laparoscopic roux-en-y gastric bypass with hiatal hernia repair N/A 06/24/2014    Procedure: ROBOTIC ROUX-EN-Y CONSTRUCTION WITH HIATAL HERNIA REPAIR;  Surgeon: Michael Boston, MD;  Location: [facility];  Service: General;  Laterality: N/A;  . Colonoscopy N/A 10/19/2014    Procedure: COLONOSCOPY;  Surgeon: Gatha Mayer, MD;  Location: [place];  Service: Endoscopy;  Laterality: N/A;  with FMT  . Fecal transplant N/A 10/19/2014    Procedure: FECAL TRANSPLANT;  Surgeon: Gatha Mayer, MD;  Location: [place];  Service: Endoscopy;  Laterality: N/A;   Family History  Problem Relation Age of Onset  . Diabetes Mother   . Diabetes Maternal Grandmother   . Heart disease Father   . Colon cancer Neg Hx    Social History  Substance Use Topics  . Smoking status: Former Smoker -- 0.25 packs/day for 30 years    Types: Cigarettes    Quit date: 06/18/2014  . Smokeless tobacco: Never Used  . Alcohol Use: No   OB History    No data available      Review  of Systems  Constitutional: Negative for fever.  Respiratory: Positive for shortness of breath.   Gastrointestinal: Positive for abdominal pain and diarrhea. Negative for nausea and vomiting.  All other systems reviewed and are negative.   Allergies  Cefotetan; Nsaids; Codeine sulfate; and Flagyl  Home Medications   Prior to Admission medications   Medication Sig Start Date End Date Taking? Authorizing Provider  acetaminophen (TYLENOL) 500 MG tablet Take 1,000 mg by mouth every 6 (six) hours as needed for  mild pain or fever (pain and fever).     Historical Provider, MD  ALPRAZolam Duanne Moron) 0.25 MG tablet Take 0.25 mg by mouth daily.     Historical Provider, MD  atorvastatin (LIPITOR) 20 MG tablet Take 1 tablet (20 mg total) by mouth daily at 6 PM. Patient taking differently: Take 20 mg by mouth daily.  04/27/14   Venia Carbon, MD  diphenoxylate-atropine (LOMOTIL) 2.5-0.025 MG per tablet Take 1 or 2 tablets prior to each meal as needed for diarrhea Patient not taking: Reported on 11/10/2014 11/04/14   Gatha Mayer, MD  FLUoxetine (PROZAC) 40 MG capsule Take 1 capsule (40 mg total) by mouth daily. 04/27/14   Venia Carbon, MD  saccharomyces boulardii (FLORASTOR) 250 MG capsule Take 500 mg by mouth daily.    Historical Provider, MD  vancomycin (VANCOCIN HCL) 125 MG capsule Take 1 capsule (125 mg total) by mouth 4 (four) times daily. 11/23/14   Carlyle Basques, MD   BP 104/83 mmHg  Pulse 90  Temp(Src) 100 F (37.8 C) (Oral)  Resp 17  Ht 4\' 11"  (1.499 m)  Wt 139 lb 5 oz (63.192 kg)  BMI 28.12 kg/m2  SpO2 96%   Physical Exam  Constitutional: She is oriented to person, place, and time. She appears well-developed and well-nourished. No distress.  Patient appears uncomfortable.  HENT:  Head: Normocephalic and atraumatic.  Eyes: Conjunctivae and EOM are normal. No scleral icterus.  Neck: Normal range of motion.  Cardiovascular: Normal rate, regular rhythm and intact distal pulses.   Pulmonary/Chest: Effort normal and breath sounds normal. No respiratory distress. She has no wheezes. She has no rales.  Respirations even and unlabored  Abdominal: Soft. There is tenderness. There is no rebound and no guarding.  Hyperactive bowel sounds in b/l lower quadrants. Focal TTP noted in the RUQ. No masses or rigidity. Referred TTP to the RUQ with palpation to the epigastric abdomen and R mid abdomen. No TTP in the LUQ or LLQ.  Musculoskeletal: Normal range of motion.  Neurological: She is alert and  oriented to person, place, and time. She exhibits normal muscle tone. Coordination normal.  Skin: Skin is warm and dry. No rash noted. She is not diaphoretic. No erythema. No pallor.  Psychiatric: She has a normal mood and affect. Her behavior is normal.  Nursing note and vitals reviewed.   ED Course  Procedures (including critical care time) Labs Review Labs Reviewed  COMPREHENSIVE METABOLIC PANEL - Abnormal; Notable for the following:    Glucose, Bld 160 (*)    AST 48 (*)    ALT 59 (*)    Alkaline Phosphatase 131 (*)    All other components within normal limits  CBC - Abnormal; Notable for the following:    WBC 14.6 (*)    All other components within normal limits  LIPASE, BLOOD  URINALYSIS, ROUTINE W REFLEX MICROSCOPIC (NOT AT [facility])  I-STAT CG4 LACTIC ACID, ED    Imaging Review Dg Abd 1 View  11/30/2014   CLINICAL DATA:  RIGHT lower quadrant pain, recent history of C difficile colitis.  EXAM: ABDOMEN - 1 VIEW  COMPARISON:  CT abdomen and pelvis November 25, 2014  FINDINGS: The bowel gas pattern is normal. No radio-opaque calculi or other significant radiographic abnormality are seen. No free air. Soft tissue planes and included osseous structures are nonsuspicious.  IMPRESSION: Negative.   Electronically Signed   By: Elon Alas M.D.   On: 11/30/2014 05:25     I have personally reviewed and evaluated these images and lab results as part of my medical decision-making.   EKG Interpretation None      MDM   Final diagnoses:  Abdominal pain  Clostridium difficile colitis    59 year old female with a history of C. difficile colitis, most recent positive C. difficile PCR 5 days ago, presents to the emergency department for right upper quadrant abdominal pain. She has a hx of fecal transplant. Patient followed by Dr. Fuller Plan, gastroenterology.  Pain not well controlled with IV Dilaudid 2. Recent CT shows no evidence of perforation or abscess; findings consistent with  colitis. X-ray today does not reveal any free air to suggest perforation. Leukocytosis of 14.6 is increased from prior visit. LFTs are also mildly elevated. Labs are otherwise stable. IV Flagyl initiated in ED. Plan to admit for pain control. Dr. Leonides Schanz to consult [facility].   Filed Vitals:   11/30/14 0335 11/30/14 0345 11/30/14 0400 11/30/14 0415  BP: 104/83 127/65 130/64 121/61  Pulse: 90 92 87 88  Temp: 100 F (37.8 C)     TempSrc: Oral     Resp: 17     Height:      Weight:      SpO2: 96% 97% 96% 97%     Antonietta Breach, PA-C 11/30/14 [place] Ward, DO 12/18/14 2301

## 2014-11-30 NOTE — H&P (Signed)
[facility] History and Physical  Shelly Hood DTO:[id number] DOB: 11-22-1955 DOA: 11/30/2014  Referring physician: ED physician, Dr. 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 Carlean Purl, now presented to [facility] emergency department with progressively worsening watery diarrhea especially over the past 24 hours, associated with constant and sharp right upper and mid area abdominal pain, 10/10 in severity and occasionally but not consistently radiating to entire abdominal area. Patient denies any specific alleviating factors, pain seems to be worse with eating. Patient reports this has been associated with nausea, poor oral intake, malaise. Patient denies fevers and chills, no chest pain or shortness of breath. Patient explains she has had up to 17 episodes of watery diarrhea per day and has significantly affected the quality of her life. Patient also explains that she has been previously on oral vancomycin and Flagyl and neither medication helped with C. Difficile.  In emergency department, patient noted mild distress due to abdominal pain, vital signs were stable, blood work notable for WBC 14.6. TRH asked to admit for further evaluation and GI team consulted for assistance.  Assessment and Plan:  Principal Problem:   Clostridium difficile colitis - Recurrent, status post multiple oral medications treatment and fecal transplant - GI team consulted, will continue to follow up on recommendations - For now we'll continue IV fluids and IV Flagyl - Monitor electrolytes and supplement if needed  Active Problems:   TOBACCO ABUSE - I have addressed tobacco cessation  and patient verbalized understanding    Leukocytosis - Likely reactive and secondary to C. Difficile - Continue Flagyl and repeat CBC in the morning     Transaminitis - Unclear etiology of acute transaminitis, repeat CMET in the morning    GERD - Emphasized importance of no PPI use - Patient denies no use of PPI    DVT prophylaxis - Lovenox SQ   Radiological Exams on Admission: Dg Abd 1 View 11/30/2014   Negative.     Code Status: Full Family Communication: Pt and family at bedside Disposition Plan: Admit for further evaluation    [facility] [phone]   Review of Systems:  Constitutional: Negative for fever, chills and malaise/fatigue. Negative for diaphoresis.  HENT: Negative for hearing loss, ear pain, nosebleeds, congestion, sore throat, neck pain, tinnitus and ear discharge.   Eyes: Negative for blurred vision, double vision, photophobia, pain, discharge and redness.  Respiratory: Negative for cough, hemoptysis, sputum production, shortness of breath, wheezing and stridor.   Cardiovascular: Negative for chest pain, palpitations, orthopnea, claudication and leg swelling.  Gastrointestinal: Per HPI Genitourinary: Negative for dysuria, urgency, frequency, hematuria and flank pain.  Musculoskeletal: Negative for myalgias, back pain, joint pain and falls.  Skin: Negative for itching and rash.  Neurological: Negative for dizziness and weakness. \ Endo/Heme/Allergies: Negative for environmental allergies and polydipsia. Does not bruise/bleed easily.  Psychiatric/Behavioral: Negative for suicidal ideas. The patient is not nervous/anxious.      Past Medical History  Diagnosis Date  . Panic attacks   . High cholesterol   . Obesity   . Gastric outlet obstruction   . Pyloric ulcer   . Depression   . GERD (gastroesophageal reflux disease)   . Pyloric stenosis   .  Clostridium difficile infection     Past Surgical History  Procedure Laterality Date  . Rhinoplasty       X3  . Colonscopy  AGE 45  . Vagotomy N/A 06/24/2014    Procedure: ROBOTIC TRUNCAL VAGOTOMY ;  Surgeon: Michael Boston, MD;  Location: [facility];  Service: General;  Laterality: N/A;  This is [name]'s preference  . Antrectomy N/A 06/24/2014    Procedure: ROBOTIC DISTAL GASTRECTOMY;  Surgeon: Michael Boston, MD;  Location: [facility];  Service: General;  Laterality: N/A;  . Laparoscopic roux-en-y gastric bypass with hiatal hernia repair N/A 06/24/2014    Procedure: ROBOTIC ROUX-EN-Y CONSTRUCTION WITH HIATAL HERNIA REPAIR;  Surgeon: Michael Boston, MD;  Location: [facility];  Service: General;  Laterality: N/A;  . Colonoscopy N/A 10/19/2014    Procedure: COLONOSCOPY;  Surgeon: Gatha Mayer, MD;  Location: [place];  Service: Endoscopy;  Laterality: N/A;  with FMT  . Fecal transplant N/A 10/19/2014    Procedure: FECAL TRANSPLANT;  Surgeon: Gatha Mayer, MD;  Location: [place];  Service: Endoscopy;  Laterality: N/A;    Social History:  reports that she quit smoking about 5 months ago. Her smoking use included Cigarettes. She has a 7.5 pack-year smoking history. She has never used smokeless tobacco. She reports that she does not drink alcohol or use illicit drugs.  Allergies  Allergen Reactions  . Cefotetan Other (See Comments)    Thrombocytopenia, plat ct 18K  . Nsaids     DUODENAL ULCER  . Codeine Sulfate     REACTION: nausea only tolerates hydrocodone  . Flagyl [Metronidazole] Other (See Comments)    Weak, bad taste     Family History  Problem Relation Age of Onset  . Diabetes Mother   . Diabetes Maternal Grandmother   . Heart disease Father   . Colon cancer Neg Hx     Prior to Admission medications   Medication Sig Start Date End Date Taking? Authorizing Amiylah Anastos  acetaminophen (TYLENOL) 500 MG tablet Take 1,000 mg by mouth every 6 (six) hours as needed for mild pain or fever (pain and fever).     Historical Kazzandra Desaulniers, MD  ALPRAZolam Duanne Moron) 0.25 MG tablet Take 0.25 mg by mouth  daily.     Historical Jushua Waltman, MD  atorvastatin (LIPITOR) 20 MG tablet Take 1 tablet (20 mg total) by mouth daily at 6 PM. Patient taking differently: Take 20 mg by mouth daily.  04/27/14   Venia Carbon, MD  diphenoxylate-atropine (LOMOTIL) 2.5-0.025 MG per tablet Take 1 or 2 tablets prior to each meal as needed for diarrhea Patient not taking: Reported on 11/10/2014 11/04/14   Gatha Mayer, MD  FLUoxetine (PROZAC) 40 MG capsule Take 1 capsule (40 mg total) by mouth daily. 04/27/14   Venia Carbon, MD  saccharomyces boulardii (FLORASTOR) 250 MG capsule Take 500 mg by mouth daily.    Historical Cailin Gebel, MD  vancomycin (VANCOCIN HCL) 125 MG capsule Take 1 capsule (125 mg total) by mouth 4 (four) times daily. 11/23/14   Carlyle Basques, MD    Physical Exam: Filed Vitals:   11/30/14 0702 11/30/14 0715 11/30/14 0730 11/30/14 0745  BP: 113/60 111/66 131/54 116/56  Pulse: 96 85 80 84  Temp: 98.7 F (37.1 C)     TempSrc: Oral     Resp: 15     Height:      Weight:      SpO2: 95% 96% 94% 94%    Physical Exam  Constitutional: Appears well-developed  and well-nourished. No distress.  HENT: Normocephalic. External right and left ear normal. Oropharynx is clear and moist.  Eyes: Conjunctivae and EOM are normal. PERRLA, no scleral icterus.  Neck: Normal ROM. Neck supple. No JVD. No tracheal deviation. No thyromegaly.  CVS: RRR, S1/S2 +, no murmurs, no gallops, no carotid bruit.  Pulmonary: Effort and breath sounds normal, no stridor, rhonchi, wheezes, rales.  Abdominal: Soft. BS +,  no distension, tenderness in the right upper and mid area quadrant, no rebound or guarding.  Musculoskeletal: Normal range of motion. No edema and no tenderness.  Lymphadenopathy: No lymphadenopathy noted, cervical, inguinal. Neuro: Alert. Normal reflexes, muscle tone coordination. No cranial nerve deficit. Skin: Skin is warm and dry. No rash noted. Not diaphoretic. No erythema. No pallor.  Psychiatric: Normal  mood and affect. Behavior, judgment, thought content normal.   Labs on Admission:  Basic Metabolic Panel:  Recent Labs Lab 11/25/14 0948 11/30/14 0116  NA 139 140  K 4.3 4.1  CL 105 103  CO2 25 27  GLUCOSE 102* 160*  BUN 7 16  CREATININE 0.55 0.72  CALCIUM 9.1 9.2   Liver Function Tests:  Recent Labs Lab 11/25/14 0948 11/30/14 0116  AST 39 48*  ALT 48 59*  ALKPHOS 114 131*  BILITOT 0.4 0.3  PROT 6.9 7.2  ALBUMIN 3.4* 3.5    Recent Labs Lab 11/25/14 0948 11/30/14 0116  LIPASE 24 45   CBC:  Recent Labs Lab 11/25/14 0948 11/30/14 0116  WBC 7.4 14.6*  NEUTROABS 4.9  --   HGB 12.1 13.5  HCT 36.4 41.5  MCV 82.0 83.7  PLT 287 359    EKG: pending    If 7PM-7AM, please contact night-coverage [email or web address] Password [facility] 11/30/2014, 8:42 AM

## 2014-11-30 NOTE — Consult Note (Signed)
[place] for Infectious Disease  Total days of antibiotics 7        Day 7 oral vanco        Day 1 fidoxamicin/Iv metronidazole               Reason for Consult: recurrent c.difficile colitis    Referring Physician: myers/pyrtle  Principal Problem:   Clostridium difficile colitis Active Problems:   TOBACCO ABUSE   GERD   Clostridium difficile diarrhea   Leukocytosis   Transaminitis   Abdominal pain   Elevated LFTs   Abnormal CT scan, colon    HPI: Shelly Hood is a 59 y.o. female  with hx of pyloric stricture s/p vagotomy/antrectomy/roux en y in April 4076, complicated by recurrent cdifficile. On her first course of c.difficile, she was given a 2 wk course of metronidazole where she still had watery diarrhea at end of the course and switched to oral vancomycin. She did well for 2 wk then started to have recurrent symptoms of watery diarrhea. Her 2nd bout of c.difficile is thought to have occurred at this time where she was now given oral vancomycin $RemoveBeforeD'250mg'gkgdIuavdmlrPq$  dose for roughly 3-4 wks to when I first met her in ID clinic in late July. At this time she had #30 weight loss since her surgery. The plan was to continue her on oral vancomycin for 2 wk to have her undergo FMT. she had  FMT, using stool from [facility] on 10/19/14  But unfortunately, she had large BM right after procedure and questionable how much of the fecal transplant that she retained. She had 2 days of no bowel movement. But then she has been having abdominal cramping watery diarrhea, low grade fever post FMT. Stool tested again positive for cdiff on 8/22 and was re-establish on a course of oral vanco $RemoveBe'125mg'FrYWswCdZ$  x 2 wks. She was seen in the o/p clinic on 11/10/14, roughly a week into treatment where she reported having some improvement with oral vancomycin. She has only pencil thin bowel movements 2-4 x per day but no diarrhea. No cramping. Good energy. 2 lb weight gain. She had finished her 2wk course of treatment, and had been off  of treatment for roughly a week before her symptoms of profuse watery diarrhea returned. She was reinitiated on 2nd trial of oral vancomycin on 9/14, as well as retested stool in case this was post infectious IBS. Repeat testing still positive. This time, she reports still having significant watery diarrhea without significant improvement with oral vanco. She went to the ED on Sept 16th for new onset RUQ pain. abd CT showed inflammation around hepatic flexure. Pain improved slightly over the weekend, but became acutely worse, intolerable and thus returned to hospital for admission on 9/21. She had not had this severe, esp RUQ pain in her prior cdifficile episodes  On admit, labs show leukocytosis of 13.6K, slight elevation in ast/alt in 40/50s previously 20s, slight increase in ALP 130s. She was started on iv metronidazole, fidaxomicin and iv dilaudid. She reports slightly improved RUQ pain but significant nausea.    abd ct from 9/16 showed but RUQ u/s did not show any GB disease IMPRESSION: Acute inflammatory change in the fat medially anterior to the hepatic flexure of the colon. Differential diagnostic possibilities include postoperative fat necrosis, epiploic appendagitis, focal colitis, or mass, which is less likely.  Past Medical History  Diagnosis Date  . Panic attacks   . High cholesterol   . Obesity   .  Gastric outlet obstruction   . Pyloric ulcer   . Depression   . GERD (gastroesophageal reflux disease)   . Pyloric stenosis   . Clostridium difficile infection     Allergies:  Allergies  Allergen Reactions  . Cefotetan Other (See Comments)    Thrombocytopenia, plat ct 18K  . Nsaids     DUODENAL ULCER  . Codeine Sulfate     REACTION: nausea only tolerates hydrocodone  . Flagyl [Metronidazole] Other (See Comments)    Weak, bad taste     MEDICATIONS: . enoxaparin (LOVENOX) injection  40 mg Subcutaneous Q24H  . feeding supplement  1 Container Oral TID BM  . fidaxomicin   200 mg Oral BID  . ondansetron  8 mg Intravenous 3 times per day  . ondansetron  4 mg Oral 5 X Daily  . saccharomyces boulardii  250 mg Oral BID    Social History  Substance Use Topics  . Smoking status: Former Smoker -- 0.25 packs/day for 30 years    Types: Cigarettes    Quit date: 06/18/2014  . Smokeless tobacco: Never Used  . Alcohol Use: No    Family History  Problem Relation Age of Onset  . Diabetes Mother   . Diabetes Maternal Grandmother   . Heart disease Father   . Colon cancer Neg Hx      Review of Systems:  Constitutional: Negative for fever, chills and malaise/fatigue. Negative for diaphoresis.  HENT: Negative for hearing loss, ear pain, nosebleeds, congestion, sore throat, neck pain, tinnitus and ear discharge.  Eyes: Negative for blurred vision, double vision, photophobia, pain, discharge and redness.  Respiratory: Negative for cough, hemoptysis, sputum production, shortness of breath, wheezing and stridor.  Cardiovascular: Negative for chest pain, palpitations, orthopnea, claudication and leg swelling.  Gastrointestinal: Per HPI Genitourinary: Negative for dysuria, urgency, frequency, hematuria and flank pain.  Musculoskeletal: Negative for myalgias, back pain, joint pain and falls.  Skin: Negative for itching and rash.  Neurological: Negative for dizziness and weakness.  Endo/Heme/Allergies: Negativebruise/bleed easily.  Psychiatric/Behavioral: Negative for suicidal ideas. The patient is not nervous/anxious.    OBJECTIVE: Temp:  [98.3 F (36.8 C)-100 F (37.8 C)] 99 F (37.2 C) (09/21 2026) Pulse Rate:  [71-105] 71 (09/21 1500) Resp:  [15-20] 18 (09/21 1500) BP: (102-133)/(50-83) 133/52 mmHg (09/21 1500) SpO2:  [93 %-97 %] 93 % (09/21 1500) Weight:  [139 lb 5 oz (63.192 kg)] 139 lb 5 oz (63.192 kg) (09/21 0105) General: Alert, Well-developed, well-nourished white female , pleasant and in mild distress Head: Normocephalic and  atraumatic. Eyes: Sclera clear, no icterus. Conjunctiva pink. Ears: Normal auditory acuity. Nose: No deformity, discharge, or lesions. Mouth: No deformity or lesions.  Neck: Supple; no masses or thyromegaly. Lungs: Clear throughout to auscultation. No wheezes, crackles, or rhonchi.  Heart: Regular rate and rhythm; no murmurs, clicks, rubs, or gallops. Abdomen: Soft BS, hypoactive, quite tender RUQ with guarding , no rebound Msk: Symmetrical without gross deformities. . Pulses: Normal pulses noted. Extremities: Without clubbing or edema. Neurologic: Alert and oriented x4; grossly normal neurologically. Skin: Intact without significant lesions or rashes.. Psych: Alert and cooperative. Normal mood and affect. LABS: Results for orders placed or performed during the hospital encounter of 11/30/14 (from the past 48 hour(s))  Lipase, blood     Status: None   Collection Time: 11/30/14  1:16 AM  Result Value Ref Range   Lipase 45 22 - 51 U/L  Comprehensive metabolic panel     Status: Abnormal   Collection  Time: 11/30/14  1:16 AM  Result Value Ref Range   Sodium 140 135 - 145 mmol/L   Potassium 4.1 3.5 - 5.1 mmol/L   Chloride 103 101 - 111 mmol/L   CO2 27 22 - 32 mmol/L   Glucose, Bld 160 (H) 65 - 99 mg/dL   BUN 16 6 - 20 mg/dL   Creatinine, Ser 0.72 0.44 - 1.00 mg/dL   Calcium 9.2 8.9 - 10.3 mg/dL   Total Protein 7.2 6.5 - 8.1 g/dL   Albumin 3.5 3.5 - 5.0 g/dL   AST 48 (H) 15 - 41 U/L   ALT 59 (H) 14 - 54 U/L   Alkaline Phosphatase 131 (H) 38 - 126 U/L   Total Bilirubin 0.3 0.3 - 1.2 mg/dL   GFR calc non Af Amer >60 >60 mL/min   GFR calc Af Amer >60 >60 mL/min    Comment: (NOTE) The eGFR has been calculated using the CKD EPI equation. This calculation has not been validated in all clinical situations. eGFR's persistently <60 mL/min signify possible Chronic Kidney Disease.    Anion gap 10 5 - 15  CBC     Status: Abnormal   Collection Time: 11/30/14  1:16  AM  Result Value Ref Range   WBC 14.6 (H) 4.0 - 10.5 K/uL   RBC 4.96 3.87 - 5.11 MIL/uL   Hemoglobin 13.5 12.0 - 15.0 g/dL   HCT 41.5 36.0 - 46.0 %   MCV 83.7 78.0 - 100.0 fL   MCH 27.2 26.0 - 34.0 pg   MCHC 32.5 30.0 - 36.0 g/dL   RDW 14.2 11.5 - 15.5 %   Platelets 359 150 - 400 K/uL  Urinalysis, Routine w reflex microscopic (not at [facility])     Status: None   Collection Time: 11/30/14  1:18 AM  Result Value Ref Range   Color, Urine YELLOW YELLOW   APPearance CLEAR CLEAR   Specific Gravity, Urine 1.020 1.005 - 1.030   pH 7.0 5.0 - 8.0   Glucose, UA NEGATIVE NEGATIVE mg/dL   Hgb urine dipstick NEGATIVE NEGATIVE   Bilirubin Urine NEGATIVE NEGATIVE   Ketones, ur NEGATIVE NEGATIVE mg/dL   Protein, ur NEGATIVE NEGATIVE mg/dL   Urobilinogen, UA 0.2 0.0 - 1.0 mg/dL   Nitrite NEGATIVE NEGATIVE   Leukocytes, UA NEGATIVE NEGATIVE    Comment: MICROSCOPIC NOT DONE ON URINES WITH NEGATIVE PROTEIN, BLOOD, LEUKOCYTES, NITRITE, OR GLUCOSE <1000 mg/dL.  I-Stat CG4 Lactic Acid, ED     Status: None   Collection Time: 11/30/14  3:58 AM  Result Value Ref Range   Lactic Acid, Venous 1.11 0.5 - 2.0 mmol/L  CBC     Status: Abnormal   Collection Time: 11/30/14 10:43 AM  Result Value Ref Range   WBC 13.6 (H) 4.0 - 10.5 K/uL   RBC 4.56 3.87 - 5.11 MIL/uL   Hemoglobin 12.3 12.0 - 15.0 g/dL   HCT 37.9 36.0 - 46.0 %   MCV 83.1 78.0 - 100.0 fL   MCH 27.0 26.0 - 34.0 pg   MCHC 32.5 30.0 - 36.0 g/dL   RDW 14.4 11.5 - 15.5 %   Platelets 290 150 - 400 K/uL  Creatinine, serum     Status: None   Collection Time: 11/30/14 10:43 AM  Result Value Ref Range   Creatinine, Ser 0.60 0.44 - 1.00 mg/dL   GFR calc non Af Amer >60 >60 mL/min   GFR calc Af Amer >60 >60 mL/min    Comment: (NOTE) The eGFR  has been calculated using the CKD EPI equation. This calculation has not been validated in all clinical situations. eGFR's persistently <60 mL/min signify possible Chronic Kidney Disease.     MICRO: 9/14 cdiff  pcr positive 8/22 cdiff pcr positive 7/5 cdiff pcr positive IMAGING: Dg Abd 1 View  11/30/2014   CLINICAL DATA:  RIGHT lower quadrant pain, recent history of C difficile colitis.  EXAM: ABDOMEN - 1 VIEW  COMPARISON:  CT abdomen and pelvis November 25, 2014  FINDINGS: The bowel gas pattern is normal. No radio-opaque calculi or other significant radiographic abnormality are seen. No free air. Soft tissue planes and included osseous structures are nonsuspicious.  IMPRESSION: Negative.   Electronically Signed   By: Elon Alas M.D.   On: 11/30/2014 05:25   Assessment/Plan:  59yo F with refractory c.difficile with failed FMT, current course of c.difficile being treated since 10/31/14, now has element of RUQ pain, CT showing acute inflammatory change in the fat medially anterior to the hepatic flexure of the colon but labs showing slight transaminitis, concern for GB disease, colitis due to c.difficile.  RUQ pain - agree with Dr. Hilarie Fredrickson to recommend doing HIDA scan to ensure she is not having element of GB disease - unclear why inflammation isolated to hepatic flexure ( would think more of TI region for cdifficile vs. Pan colitis) unless this possible could be ischemia related, though she is not having any bloody stools. If worsening exam, may need to consider repeat CT imaging, for now repeat abd xray in am  Refractory c.difficile = agree with switching to fidoxamicin as alternative treatment other than vancomycin since she does not appear to be improving on it. i would discontinue IV metronidazole since it is likely contributing to her nausea. We will screen her daughter as potential stool donor for repeat FMT. Alternatively, can consider discussing with [facility] if they want to accept her for management, as the family is requesting (daughter vs. Patient)  Nausea = would schedule high dose zofran to see if helps her symptoms. Consider switching toradol for pain relief if she is still nauseated tomorrow,  since it could be due to her pain meds   [facility] [place] [facility] [phone]

## 2014-11-30 NOTE — ED Notes (Signed)
Called Dr. Leonides Schanz to report patient's pain. MD acknowledges, awaiting new orders.

## 2014-11-30 NOTE — ED Notes (Signed)
Attempted to call report, receiving RN is Baxter Flattery at [phone].

## 2014-11-30 NOTE — ED Notes (Signed)
Attempted report 

## 2014-11-30 NOTE — Consult Note (Signed)
Consultation  Referring Provider: [facility] HospitalistPrimary Care Physician:  Viviana Simpler, MD Primary Gastroenterologist:  Dr.Gessner  Reason for Consultation:   Recurrent refractory C.Diff   HPI: Shelly Hood is a 59 y.o. female known to Dr. Fuller Plan, and Dr. Carlean Purl. She has history of pyloric stenosis and required a distal gastrectomy, truncal vagotomy and hiatal hernia repair per Dr. Johney Maine in April 2016. Subsequent to that she developed C. difficile colitis in May 2016. She was initially treated with metronidazole 2 weeks. This helped her symptoms initially but within a couple of days she relapsed and was then treated with 2 weeks of vancomycin. She continued to have problems throughout the summer was again C. difficile positive towards the end of July. She was seen by infectious disease, Dr. Baxter Flattery and was treated with a long course of vancomycin at that time and fecal Osborne Casco transplant was recommended. This was done per Dr. Carlean Purl on 10/19/2014. Unfortunately she had a large bowel movement fairly immediately after the procedure. She again tested C. difficile positive on 11/10/2014. She has been followed by Dr. Baxter Flattery and was placed back on vancomycin 4 times daily for 2 weeks. Apparently she has continued to have problems with diarrhea which is worsened again over the past couple of days. She is now having 17-18 bowel movements per day and complaining of right upper quadrant abdominal pain which has become severe and constant past couple days . She is admitted this morning with refractory C. difficile colitis.  Ct abd/pelvis on 9/16 showed acute inflammation in fat anterior to hepatic flexure raising question of epiploic appendagitis vs focal colitis vs mass.  Upper abdominal US 11/25/2014- negative, no gallstones, ductal dilation  Or wall thickening  Past Medical History  Diagnosis Date  . Panic attacks   . High cholesterol   . Obesity   . Gastric outlet obstruction   .  Pyloric ulcer   . Depression   . GERD (gastroesophageal reflux disease)   . Pyloric stenosis   . Clostridium difficile infection     Past Surgical History  Procedure Laterality Date  . Rhinoplasty      X3  . Colonscopy  AGE 24  . Vagotomy N/A 06/24/2014    Procedure: ROBOTIC TRUNCAL VAGOTOMY ;  Surgeon: Michael Boston, MD;  Location: [facility];  Service: General;  Laterality: N/A;  This is [name]'s preference  . Antrectomy N/A 06/24/2014    Procedure: ROBOTIC DISTAL GASTRECTOMY;  Surgeon: Michael Boston, MD;  Location: [facility];  Service: General;  Laterality: N/A;  . Laparoscopic roux-en-y gastric bypass with hiatal hernia repair N/A 06/24/2014    Procedure: ROBOTIC ROUX-EN-Y CONSTRUCTION WITH HIATAL HERNIA REPAIR;  Surgeon: Michael Boston, MD;  Location: [facility];  Service: General;  Laterality: N/A;  . Colonoscopy N/A 10/19/2014    Procedure: COLONOSCOPY;  Surgeon: Gatha Mayer, MD;  Location: [place];  Service: Endoscopy;  Laterality: N/A;  with FMT  . Fecal transplant N/A 10/19/2014    Procedure: FECAL TRANSPLANT;  Surgeon: Gatha Mayer, MD;  Location: [place];  Service: Endoscopy;  Laterality: N/A;    Prior to Admission medications   Medication Sig Start Date End Date Taking? Authorizing Provider  acetaminophen (TYLENOL) 500 MG tablet Take 1,000 mg by mouth every 6 (six) hours as needed for mild pain or fever (pain and fever).    Yes Historical Provider, MD  ALPRAZolam (XANAX) 0.25 MG tablet Take 0.25 mg by mouth daily.    Yes Historical Provider, MD  atorvastatin (LIPITOR) 20 MG tablet Take 1 tablet (20 mg total) by mouth daily at 6 PM. Patient taking differently: Take 20 mg by mouth daily.  04/27/14  Yes Venia Carbon, MD  FLUoxetine (PROZAC) 40 MG capsule Take 1 capsule (40 mg total) by mouth daily. 04/27/14  Yes Venia Carbon, MD  saccharomyces boulardii (FLORASTOR) 250 MG capsule Take 500 mg by mouth daily.   Yes Historical Provider, MD  vancomycin (VANCOCIN HCL) 125 MG  capsule Take 1 capsule (125 mg total) by mouth 4 (four) times daily. 11/23/14  Yes Carlyle Basques, MD  diphenoxylate-atropine (LOMOTIL) 2.5-0.025 MG per tablet Take 1 or 2 tablets prior to each meal as needed for diarrhea Patient not taking: Reported on 11/10/2014 11/04/14   Gatha Mayer, MD    Current Facility-Administered Medications  Medication Dose Route Frequency Provider Last Rate Last Dose  . 0.9 %  sodium chloride infusion   Intravenous Continuous Theodis Blaze, MD 75 mL/hr at 11/30/14 1054    . enoxaparin (LOVENOX) injection 40 mg  40 mg Subcutaneous Q24H Theodis Blaze, MD   40 mg at 11/30/14 1053  . HYDROmorphone (DILAUDID) injection 1 mg  1 mg Intravenous Q3H PRN Theodis Blaze, MD      . metroNIDAZOLE (FLAGYL) IVPB 500 mg  500 mg Intravenous Q8H Theodis Blaze, MD      . ondansetron [facility]) tablet 4 mg  4 mg Oral Q6H PRN Theodis Blaze, MD       Or  . ondansetron [facility]) injection 4 mg  4 mg Intravenous Q6H PRN Theodis Blaze, MD   4 mg at 11/30/14 1053    Allergies as of 11/30/2014 - Review Complete 11/30/2014  Allergen Reaction Noted  . Cefotetan Other (See Comments) 06/26/2014  . Nsaids  06/24/2014  . Codeine sulfate    . Flagyl [metronidazole] Other (See Comments) 10/12/2014    Family History  Problem Relation Age of Onset  . Diabetes Mother   . Diabetes Maternal Grandmother   . Heart disease Father   . Colon cancer Neg Hx     Social History   Social History  . Marital Status: Married    Spouse Name: N/A  . Number of Children: 2  . Years of Education: N/A   Occupational History  . 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 Smoking status: Former Smoker -- 0.25 packs/day for 30 years    Types: Cigarettes    Quit date: 06/18/2014  . Smokeless tobacco: Never Used  . Alcohol Use: No  . Drug Use: No  . Sexual Activity:    Partners: Male    Birth Control/ Protection: None   Other Topics Concern  . Not on file    Social History Narrative    Review of Systems: Pertinent positive and negative review of systems were noted in the above HPI section.  All other review of systems was otherwise negative.  Physical Exam: Vital signs in last 24 hours: Temp:  [98.3 F (36.8 C)-100 F (37.8 C)] 98.5 F (36.9 C) (09/21 0900) Pulse Rate:  [80-105] 84 (09/21 0900) Resp:  [15-20] 18 (09/21 0900) BP: (102-131)/(50-83) 123/68 mmHg (09/21 0900) SpO2:  [94 %-97 %] 97 % (09/21 0900) Weight:  [139 lb 5 oz (63.192 kg)] 139 lb 5 oz (63.192 kg) (09/21 0105)   General:   Alert,  Well-developed, well-nourished white female , pleasant and cooperative in NAD  Head:  Normocephalic and atraumatic. Eyes:  Sclera clear, no icterus.   Conjunctiva pink. Ears:  Normal auditory acuity. Nose:  No deformity, discharge,  or lesions. Mouth:  No deformity or lesions.   Neck:  Supple; no masses or thyromegaly. Lungs:  Clear throughout to auscultation.   No wheezes, crackles, or rhonchi. Heart:  Regular rate and rhythm; no murmurs, clicks, rubs,  or gallops. Abdomen:  Soft, quite tender RUQ with guarding , no rebound, BS active,nonpalp mass or hsm.   Rectal:  Deferred  Msk:  Symmetrical without gross deformities. . Pulses:  Normal pulses noted. Extremities:  Without clubbing or edema. Neurologic:  Alert and  oriented x4;  grossly normal neurologically. Skin:  Intact without significant lesions or rashes.. Psych:  Alert and cooperative. Normal mood and affect.  Intake/Output from previous day:   Intake/Output this shift:    Lab Results:  Recent Labs  11/30/14 0116 11/30/14 1043  WBC 14.6* 13.6*  HGB 13.5 12.3  HCT 41.5 37.9  PLT 359 290   BMET  Recent Labs  11/30/14 0116 11/30/14 1043  NA 140  --   K 4.1  --   CL 103  --   CO2 27  --   GLUCOSE 160*  --   BUN 16  --   CREATININE 0.72 0.60  CALCIUM 9.2  --    LFT  Recent Labs  11/30/14 0116  PROT 7.2  ALBUMIN 3.5  AST 48*  ALT 59*  ALKPHOS 131*   BILITOT 0.3   PT/INR No results for input(s): LABPROT, INR in the last 72 hours. Hepatitis Panel No results for input(s): HEPBSAG, HCVAB, HEPAIGM, HEPBIGM in the last 72 hours.   IMPRESSION:   #1 59 yo female with initial diagnosis of C. difficile in May 2016, refractory to metronidazole and vancomycin. 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 She had recurrence of symptoms within 2 weeks and was again C. difficile positive on 11/10/2014 and treated with a two-week course  of vancomycin. Admitted today with progressive diarrhea and right sided  abdominal pain with leukocytosis.  At this time patient has C. difficile colitis refractory to all therapies thus far including fecal microbial transplant. One caveat is that she was unable to retain the fecal transplant well after colonoscopy on 10/19/2014 which decreased its efficacy.  She now has a focal inflammatory process at hepatic flexure of colon which is new as well  #2 GERD #3 history of pyloric stricture status post distal gastrectomy, truncal vagotomy and hiatal hernia repair April 2016  PLAN: #1 will consult infectious disease,/Dr. Carlyle Basques as she may need a second fecal microbial transplant.(done) #2 start  Dificid 200 mg by mouth twice a day if we are able to get this in house.(Done) #3  Start Florastor  by mouth twice a day #4 Clear liquid diet Pt mentions that she was told by hospitalist that she may need to be transferred to [facility]- will defer to Dr. Cecilie Kicks think transfer only if protocol available there for quick FMT   Pt having Nausea and vomiting with IV flagyl and pain meds- will stop flagyl   Weldon Nouri  11/30/2014, 12:12 PM

## 2014-11-30 NOTE — ED Provider Notes (Signed)
Medical screening examination/treatment/procedure(s) were conducted as a shared visit with non-physician practitioner(s) and myself.  I personally evaluated the patient during the encounter.   EKG Interpretation None      Pt is a 59 y.o. female with history of C. difficile colitis who has been on vancomycin since April who presents emergency department with uncontrolled pain from her C. difficile colitis. Recently underwent CT imaging that showed colitis. Last week had a positive C. difficile study. Unable to control pain with IV pain medication. Abdomen is not surgical. No perforation seen on x-ray. She does now have a leukocytosis, fever and is mildly tachycardic. Given IV Flagyl. Discussed with Dr. Doyle Askew for admission.  Patient has had a previous fecal transplant done at [facility].   7:40 AM  Discussed with Dr. Henrene Pastor gastroenterology who will see the patient in consult. Her gastroenterologist is Dr. Fuller Plan.  [place], DO 11/30/14 [phone]

## 2014-11-30 NOTE — ED Notes (Signed)
Called Dr. Leonides Schanz, as family inquired about another CT scan. MD acknowledges, advises no new CT of abdomen to be ordered as one is already on file recently . Awaiting admitting MD. Discussed with family.

## 2014-11-30 NOTE — ED Notes (Addendum)
Pt. reports persistent right abdominal pain with nausea and diarrhea onset last week , denies  fever or chills , seen here 5 days ago with the same complaints currently taking oral Vancomycin .

## 2014-11-30 NOTE — ED Notes (Signed)
Claiborne Billings, pa, at the bedside.

## 2014-12-01 ENCOUNTER — Encounter (HOSPITAL_COMMUNITY): Payer: Self-pay | Admitting: Radiology

## 2014-12-01 ENCOUNTER — Inpatient Hospital Stay (HOSPITAL_COMMUNITY): Payer: BLUE CROSS/BLUE SHIELD

## 2014-12-01 DIAGNOSIS — R1011 Right upper quadrant pain: Secondary | ICD-10-CM | POA: Diagnosis present

## 2014-12-01 DIAGNOSIS — IMO0002 Reserved for concepts with insufficient information to code with codable children: Secondary | ICD-10-CM | POA: Diagnosis present

## 2014-12-01 DIAGNOSIS — K651 Peritoneal abscess: Secondary | ICD-10-CM

## 2014-12-01 LAB — COMPREHENSIVE METABOLIC PANEL
ALT: 36 U/L (ref 14–54)
AST: 19 U/L (ref 15–41)
Albumin: 2.8 g/dL — ABNORMAL LOW (ref 3.5–5.0)
Alkaline Phosphatase: 95 U/L (ref 38–126)
Anion gap: 7 (ref 5–15)
BILIRUBIN TOTAL: 0.4 mg/dL (ref 0.3–1.2)
BUN: 7 mg/dL (ref 6–20)
CHLORIDE: 102 mmol/L (ref 101–111)
CO2: 27 mmol/L (ref 22–32)
Calcium: 9 mg/dL (ref 8.9–10.3)
Creatinine, Ser: 0.67 mg/dL (ref 0.44–1.00)
GFR calc Af Amer: >60 mL/min (ref >60)
Glucose, Bld: 148 mg/dL — ABNORMAL HIGH (ref 65–99)
POTASSIUM: 4.6 mmol/L (ref 3.5–5.1)
Sodium: 136 mmol/L (ref 135–145)
TOTAL PROTEIN: 6.6 g/dL (ref 6.5–8.1)

## 2014-12-01 LAB — CBC
HEMATOCRIT: 37.3 % (ref 36.0–46.0)
Hemoglobin: 12.5 g/dL (ref 12.0–15.0)
MCH: 28 pg (ref 26.0–34.0)
MCHC: 33.5 g/dL (ref 30.0–36.0)
MCV: 83.4 fL (ref 78.0–100.0)
PLATELETS: 270 10*3/uL (ref 150–400)
RBC: 4.47 MIL/uL (ref 3.87–5.11)
RDW: 15 % (ref 11.5–15.5)
WBC: 16.5 10*3/uL — AB (ref 4.0–10.5)

## 2014-12-01 MED ORDER — IOHEXOL 300 MG/ML  SOLN
25.0000 mL | INTRAMUSCULAR | Status: AC
  Administered 2014-12-01 (×2): 25 mL via ORAL

## 2014-12-01 MED ORDER — IOHEXOL 300 MG/ML  SOLN
100.0000 mL | Freq: Once | INTRAMUSCULAR | Status: AC | PRN
  Administered 2014-12-01: 100 mL via INTRAVENOUS

## 2014-12-01 MED ORDER — ATORVASTATIN CALCIUM 20 MG PO TABS
20.0000 mg | ORAL_TABLET | Freq: Every day | ORAL | Status: DC
  Administered 2014-12-02 – 2014-12-09 (×8): 20 mg via ORAL
  Filled 2014-12-01 (×8): qty 1

## 2014-12-01 MED ORDER — ENSURE ENLIVE PO LIQD: 237.0000 mL | Freq: Three times a day (TID) | ORAL | Status: DC

## 2014-12-01 MED ORDER — ALPRAZOLAM 0.25 MG PO TABS
0.2500 mg | ORAL_TABLET | Freq: Every day | ORAL | Status: DC
  Filled 2014-12-01: qty 1

## 2014-12-01 MED ORDER — TECHNETIUM TC 99M MEBROFENIN IV KIT
5.6000 | PACK | Freq: Once | INTRAVENOUS | Status: DC | PRN
  Administered 2014-12-01: 6 via INTRAVENOUS
  Filled 2014-12-01: qty 6

## 2014-12-01 MED ORDER — FLUOXETINE HCL 20 MG PO CAPS
40.0000 mg | ORAL_CAPSULE | Freq: Every day | ORAL | Status: DC
  Administered 2014-12-01 – 2014-12-02 (×2): 40 mg via ORAL
  Filled 2014-12-01: qty 2

## 2014-12-01 MED ORDER — ALPRAZOLAM 0.25 MG PO TABS
0.2500 mg | ORAL_TABLET | Freq: Every day | ORAL | Status: DC
  Administered 2014-12-01 – 2014-12-10 (×10): 0.25 mg via ORAL
  Filled 2014-12-01 (×11): qty 1

## 2014-12-01 MED ORDER — ACETAMINOPHEN 325 MG PO TABS
650.0000 mg | ORAL_TABLET | Freq: Four times a day (QID) | ORAL | Status: DC | PRN
  Administered 2014-12-01 – 2014-12-05 (×5): 650 mg via ORAL
  Filled 2014-12-01 (×5): qty 2

## 2014-12-01 MED ORDER — SACCHAROMYCES BOULARDII 250 MG PO CAPS
250.0000 mg | ORAL_CAPSULE | Freq: Two times a day (BID) | ORAL | Status: DC
Start: 2014-12-01 — End: 2014-12-10
  Administered 2014-12-01 – 2014-12-10 (×19): 250 mg via ORAL
  Filled 2014-12-01 (×17): qty 1

## 2014-12-01 MED ORDER — PIPERACILLIN-TAZOBACTAM 3.375 G IVPB
3.3750 g | Freq: Three times a day (TID) | INTRAVENOUS | Status: DC
  Administered 2014-12-01 – 2014-12-10 (×27): 3.375 g via INTRAVENOUS
  Filled 2014-12-01 (×30): qty 50

## 2014-12-01 MED ORDER — ENOXAPARIN SODIUM 40 MG/0.4ML ~~LOC~~ SOLN
40.0000 mg | Freq: Every day | SUBCUTANEOUS | Status: DC
  Administered 2014-12-01: 40 mg via SUBCUTANEOUS

## 2014-12-01 MED ORDER — FLUOXETINE HCL 20 MG PO CAPS
40.0000 mg | ORAL_CAPSULE | Freq: Every day | ORAL | Status: DC
  Filled 2014-12-01: qty 2

## 2014-12-01 MED ORDER — FIDAXOMICIN 200 MG PO TABS
200.0000 mg | ORAL_TABLET | Freq: Two times a day (BID) | ORAL | Status: AC
  Administered 2014-12-01 – 2014-12-10 (×20): 200 mg via ORAL
  Filled 2014-12-01 (×21): qty 1

## 2014-12-01 MED ORDER — KETOROLAC TROMETHAMINE 30 MG/ML IJ SOLN
30.0000 mg | Freq: Once | INTRAMUSCULAR | Status: AC
Start: 2014-12-01 — End: 2014-12-01
  Administered 2014-12-01: 30 mg via INTRAVENOUS
  Filled 2014-12-01 (×2): qty 1

## 2014-12-01 NOTE — Progress Notes (Signed)
[place] for Infectious Disease    Date of Admission:  11/30/2014   Total days of antibiotics 2        Day 2 fidoxamicin           ID: Shelly Hood is a 59 y.o. female with  Hx of refractory c.difficile presents with new onset RUQ pain, mild transaminitis  Principal Problem:   Clostridium difficile colitis Active Problems:   TOBACCO ABUSE   GERD   Clostridium difficile diarrhea   Leukocytosis   Transaminitis   Abdominal pain   Elevated LFTs   Abnormal CT scan, colon    Subjective: Still having abdominal pain coming back, 8/10. No diarrhea since admit ( nearly 2 days), but has had poor po intake due to nausea. Only had 1 dose of fidaxomicin yesterday  Medications:  . ALPRAZolam  0.25 mg Oral Daily  . atorvastatin  20 mg Oral q1800  . enoxaparin (LOVENOX) injection  40 mg Subcutaneous Q24H  . feeding supplement  1 Container Oral TID BM  . fidaxomicin  200 mg Oral BID  . FLUoxetine  40 mg Oral Daily  . ketorolac  30 mg Intravenous Once  . ondansetron  8 mg Intravenous 3 times per day  . ondansetron  4 mg Oral 5 X Daily  . saccharomyces boulardii  250 mg Oral BID    Objective: Vital signs in last 24 hours: Temp:  [99 F (37.2 C)-99.3 F (37.4 C)] 99.3 F (37.4 C) (09/22 0553) Pulse Rate:  [71-78] 73 (09/22 0553) Resp:  [16-18] 16 (09/22 0553) BP: (133-137)/(51-65) 133/51 mmHg (09/22 0553) SpO2:  [93 %-97 %] 97 % (09/22 0553) gen = a xo by 3 in NAD sitting up in bed,but rubbing RUQ of abdomen HEENT= MMM, no signs of thrush Cors = NL s1,s2, no g/m/r pulm = CTAB no w/c/r abd = TTP in RUQ, hypoactive bowel sounds, soft, no rebound in lower quadrants Skin = warm, no c/c/e Ext = no edema Psych = pleasant, not tearful  Lab Results  Recent Labs  11/30/14 0116 11/30/14 1043 12/01/14 0522  WBC 14.6* 13.6* 16.5*  HGB 13.5 12.3 12.5  HCT 41.5 37.9 37.3  NA 140  --  136  K 4.1  --  4.6  CL 103  --  102  CO2 27  --  27  BUN 16  --  7  CREATININE 0.72  0.60 0.67   Liver Panel  Recent Labs  11/30/14 0116 12/01/14 0522  PROT 7.2 6.6  ALBUMIN 3.5 2.8*  AST 48* 19  ALT 59* 36  ALKPHOS 131* 95  BILITOT 0.3 0.4    Studies/Results: Dg Abd 1 View  11/30/2014   CLINICAL DATA:  RIGHT lower quadrant pain, recent history of C difficile colitis.  EXAM: ABDOMEN - 1 VIEW  COMPARISON:  CT abdomen and pelvis November 25, 2014  FINDINGS: The bowel gas pattern is normal. No radio-opaque calculi or other significant radiographic abnormality are seen. No free air. Soft tissue planes and included osseous structures are nonsuspicious.  IMPRESSION: Negative.   Electronically Signed   By: Elon Alas M.D.   On: 11/30/2014 05:25     Assessment/Plan: Right upper quadrant pain = this is a new feature to her prior episodes of c.difficile. Patient getting hida scan to evaluate gallbladder today. CT from last week showed inflammation about hepatic flexure which would correlate to exam, though etiology to cause inflammation is unclear  Refractory c.difficile = please make sure  she is getting 2 doses of fidaxomicin daily, only had 1 dose. May need further management on nausea  Nausea = possibly related to pain medication. Recommend trying toradol to see if it helps her symptoms and cause less nausea. Agree with continuing scheduled IV zofran.  Hypoactive bowel sounds in setting of not having bowel movement = concern for ileus. Please repeat abd xray today  Baxter Flattery [facility] for Infectious Diseases Cell: [phone] Pager: [phone]  12/01/2014, 11:06 AM

## 2014-12-01 NOTE — Progress Notes (Signed)
ANTIBIOTIC CONSULT NOTE - INITIAL  Pharmacy Consult for Zosyn Indication: Intra-abdominal Infection  Allergies  Allergen Reactions  . Cefotetan Other (See Comments)    Thrombocytopenia, plat ct 18K  . Nsaids     DUODENAL ULCER  . Codeine Sulfate     REACTION: nausea only tolerates hydrocodone  . Flagyl [Metronidazole] Other (See Comments)    Weak, bad taste     Patient Measurements: Height: 4\' 11"  (149.9 cm) Weight: 139 lb 5 oz (63.192 kg) IBW/kg (Calculated) : 43.2   Vital Signs: Temp: 98 F (36.7 C) (09/22 1515) Temp Source: Oral (09/22 1500) BP: 135/64 mmHg (09/22 1500) Pulse Rate: 74 (09/22 1500) Intake/Output from previous day: 09/21 0701 - 09/22 0700 In: 8101 [P.O.:300; I.V.:1380] Out: 1600 [Urine:1600] Intake/Output from this shift:    Labs:  Recent Labs  11/30/14 0116 11/30/14 1043 12/01/14 0522  WBC 14.6* 13.6* 16.5*  HGB 13.5 12.3 12.5  PLT 359 290 270  CREATININE 0.72 0.60 0.67   Estimated Creatinine Clearance: 63.5 mL/min (by C-G formula based on Cr of 0.67). No results for input(s): VANCOTROUGH, VANCOPEAK, VANCORANDOM, GENTTROUGH, GENTPEAK, GENTRANDOM, TOBRATROUGH, TOBRAPEAK, TOBRARND, AMIKACINPEAK, AMIKACINTROU, AMIKACIN in the last 72 hours.   Microbiology: Recent Results (from the past 720 hour(s))  Clostridium Difficile by PCR     Status: Abnormal   Collection Time: 11/23/14  2:40 PM  Result Value Ref Range Status   Toxigenic C Difficile by pcr Detected (AA) Not Detected Final    Comment: This test is for use only with liquid or soft stools; performance characteristics of other clinical specimen types have not been established.   This assay was performed by Cepheid GeneXpert(R) PCR. The performance characteristics of this assay have been determined by [employer]. Performance characteristics refer to the analytical performance of the test.   Stool Culture     Status: None   Collection Time: 11/23/14  2:50 PM  Result Value  Ref Range Status   Organism ID, Bacteria No Salmonella,Shigella,Campylobacter,Yersinia,or  Final   Organism ID, Bacteria No E.coli [id number]:H7 isolated.  Final    Medical History: Past Medical History  Diagnosis Date  . Panic attacks   . High cholesterol   . Obesity   . Gastric outlet obstruction   . Pyloric ulcer   . Depression   . GERD (gastroesophageal reflux disease)   . Pyloric stenosis   . Clostridium difficile infection     Medications:  Prescriptions prior to admission  Medication Sig Dispense Refill Last Dose  . acetaminophen (TYLENOL) 500 MG tablet Take 1,000 mg by mouth every 6 (six) hours as needed for mild pain or fever (pain and fever).    11/29/2014 at Unknown time  . ALPRAZolam (XANAX) 0.25 MG tablet Take 0.25 mg by mouth daily.    11/29/2014 at Unknown time  . atorvastatin (LIPITOR) 20 MG tablet Take 1 tablet (20 mg total) by mouth daily at 6 PM. (Patient taking differently: Take 20 mg by mouth daily. ) 90 tablet 3 11/29/2014 at Unknown time  . FLUoxetine (PROZAC) 40 MG capsule Take 1 capsule (40 mg total) by mouth daily. 90 capsule 3 11/29/2014 at Unknown time  . saccharomyces boulardii (FLORASTOR) 250 MG capsule Take 500 mg by mouth daily.   11/29/2014 at Unknown time  . vancomycin (VANCOCIN HCL) 125 MG capsule Take 1 capsule (125 mg total) by mouth 4 (four) times daily. 56 capsule 0 11/29/2014 at Unknown time  . diphenoxylate-atropine (LOMOTIL) 2.5-0.025 MG per tablet Take 1 or 2  tablets prior to each meal as needed for diarrhea (Patient not taking: Reported on 11/10/2014) 90 tablet 0 Not Taking   Scheduled:  . ALPRAZolam  0.25 mg Oral Daily  . atorvastatin  20 mg Oral q1800  . enoxaparin (LOVENOX) injection  40 mg Subcutaneous Daily  . feeding supplement  1 Container Oral TID BM  . feeding supplement (ENSURE ENLIVE)  237 mL Oral TID WC  . fidaxomicin  200 mg Oral BID  . FLUoxetine  40 mg Oral Daily  . ondansetron  8 mg Intravenous 3 times per day  . ondansetron  4 mg  Oral 5 X Daily  . saccharomyces boulardii  250 mg Oral BID   Assessment: 59 y.o female with history of pyloric ulcer, pyloric stenosis with gastric outlet obstruction status post Roux-en-Y bypass and hernia repair. The patient has had C. difficile colitis since April 20 16th. She underwent a fecal transplant on 10/19/14.  She presented to the hospital on 11/30/14  with right upper quadrant pain.  Admitted for Clostridium difficile colitis/leukocytosis/sepsis. Started on the Fidaxomycin by IDon 9/21.  WBC increasing to 16.5 today, afebrile currently, Tmax 100.2. Pharmacy consulted to dose Zosyn for intrabdominal infection. SCr 0.67 , estimated CrCl ~ 63 ml/min.  Goal of Therapy:  Eradication of infection.  Zosyn dose adjusted for patient's renal function.  Plan:  Zosyn 3.375 gm IV q8h (4hour infusion) Monitor clinical progress and renal function.    Thank you for allowing pharmacy to be part of this patients care team. Nicole Cella, RPh Clinical Pharmacist Pager: [phone]  Arman Bogus 12/01/2014,9:28 PM

## 2014-12-01 NOTE — Progress Notes (Signed)
Initial Nutrition Assessment  DOCUMENTATION CODES:   Not applicable  INTERVENTION:   -Continue Boost Breeze po TID, each supplement provides 250 kcal and 9 grams of protein  NUTRITION DIAGNOSIS:   Inadequate oral intake related to altered GI function as evidenced by  (clear liquid diet).  GOAL:   Patient will meet greater than or equal to 90% of their needs  MONITOR:   PO intake, Supplement acceptance, Diet advancement, Labs, Weight trends, Skin, I & O's  REASON FOR ASSESSMENT:   Malnutrition Screening Tool    ASSESSMENT:   Patient is 59 year old female with known history of pyloric ulcer, esophageal reflux disease, pyloric stenosis, gastric outlet obstruction and status post laparoscopic Roux-en-Y bypass and hiatal hernia repair, and 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 Carlean Purl, now presented to [facility] emergency department with progressively worsening watery diarrhea especially over the past 24 hours, associated with constant and sharp right upper and mid area abdominal pain, 10/10 in severity and occasionally but not consistently radiating to entire abdominal area.   Pt admitted with diarrhea and recurrent C-diff infection.   Pt unavailable at time of visit. Per RN, pt was just taken down for a procedure and won't return for several hours. Unable to perform nutrition-focused physical exam at this time.  RN reports pt has complained of persistent nausea and "did okay" with her breakfast. Noted only 25-30% meal completion. RN confirmed that pt has Boost Breeze supplement ordered, however, has not provided her one yet this shift.   Wt hx reviewed. Noted UBW around 165#. Wt trends demonstrate a 26# (15.7%) wt loss within the past 5 months. RD suspect poor intake and potential for malnutrition.   Labs reviewed.  Diet Order:  Diet clear liquid Room service appropriate?: Yes; Fluid consistency:: Thin  Skin:  Reviewed, no issues  Last  BM:  12/01/14  Height:   Ht Readings from Last 1 Encounters:  11/30/14 4\' 11"  (1.499 m)    Weight:   Wt Readings from Last 1 Encounters:  11/30/14 139 lb 5 oz (63.192 kg)    Ideal Body Weight:  44.5 kg  BMI:  Body mass index is 28.12 kg/(m^2).  Estimated Nutritional Needs:   Kcal:  1700-1900  Protein:  80-95 grams  Fluid:  1.7-1.9 L  EDUCATION NEEDS:   No education needs identified at this time  Jenifer A. Jimmye Norman, RD, LDN, CDE Pager: [phone] After hours Pager: [phone]

## 2014-12-01 NOTE — Progress Notes (Signed)
[facility] Progress Note  Subjective:  HIDA nl. LFTs nl.   Abd film with moderate bowel content throughout colon.Low grade temp. WBC 16.5. Still with some RUQ pain. Family requesting transfer to [facility].   Objective:  Vital signs in last 24 hours: Temp:  [99 F (37.2 C)-99.3 F (37.4 C)] 99.3 F (37.4 C) (09/22 0553) Pulse Rate:  [71-78] 73 (09/22 0553) Resp:  [16-18] 16 (09/22 0553) BP: (133-137)/(51-65) 133/51 mmHg (09/22 0553) SpO2:  [93 %-97 %] 97 % (09/22 0553) Last BM Date: 12/01/14 General:   Alert,  Well-developed, in NAD Heart:  Regular rate and rhythm; no murmurs Pulm;lungs clear Abdomen:  Soft, tender RUQ,hypoactive bowel sounds, without guarding, and without rebound.   Extremities:  Without edema. Neurologic:  Alert and  oriented x4;  grossly normal neurologically. Psych:  Alert and cooperative. Normal mood and affect.  Intake/Output from previous day: 09/21 0701 - 09/22 0700 In: 1680 [P.O.:300; I.V.:1380] Out: 1600 [Urine:1600] Intake/Output this shift:    Lab Results:  Recent Labs  11/30/14 0116 11/30/14 1043 12/01/14 0522  WBC 14.6* 13.6* 16.5*  HGB 13.5 12.3 12.5  HCT 41.5 37.9 37.3  PLT 359 290 270   BMET  Recent Labs  11/30/14 0116 11/30/14 1043 12/01/14 0522  NA 140  --  136  K 4.1  --  4.6  CL 103  --  102  CO2 27  --  27  GLUCOSE 160*  --  148*  BUN 16  --  7  CREATININE 0.72 0.60 0.67  CALCIUM 9.2  --  9.0   LFT  Recent Labs  12/01/14 0522  PROT 6.6  ALBUMIN 2.8*  AST 19  ALT 36  ALKPHOS 95  BILITOT 0.4    Dg Abd 1 View  11/30/2014   CLINICAL DATA:  RIGHT lower quadrant pain, recent history of C difficile colitis.  EXAM: ABDOMEN - 1 VIEW  COMPARISON:  CT abdomen and pelvis November 25, 2014  FINDINGS: The bowel gas pattern is normal. No radio-opaque calculi or other significant radiographic abnormality are seen. No free air. Soft tissue planes and included osseous structures are nonsuspicious.   IMPRESSION: Negative.   Electronically Signed   By: Elon Alas M.D.   On: 11/30/2014 05:25   Nm Hepatobiliary Including Gb  12/01/2014   CLINICAL DATA:  Right upper quadrant abdominal pain with elevated liver function studies. History of gastric outlet obstruction and pyloric ulcer. Initial encounter.  EXAM: NUCLEAR MEDICINE HEPATOBILIARY IMAGING  TECHNIQUE: Sequential images of the abdomen were obtained out to 60 minutes following intravenous administration of radiopharmaceutical.  RADIOPHARMACEUTICALS:  5.6 mCi Tc-9m  Choletec IV  COMPARISON:  Abdominal CT 11/25/2014.  FINDINGS: There is homogeneous hepatic activity with spontaneous opacification of the gallbladder, biliary system and small bowel. No evidence of bile leak or obstruction.  IMPRESSION: Normal examination.  The cystic and common bile ducts are patent.   Electronically Signed   By: Richardean Sale M.D.   On: 12/01/2014 11:28   Dg Abd 2 Views  12/01/2014   CLINICAL DATA:  Right upper quadrant pain for 1 week.  EXAM: ABDOMEN - 2 VIEW  COMPARISON:  November 30, 2014  FINDINGS: The bowel gas pattern is normal. Moderate bowel content is identified in the colon. There is no evidence of free air. Surgical sutures identified in the left upper and mid abdomen. No radio-opaque calculi or other significant radiographic abnormality is seen. There is mild atelectasis of left lung base.  IMPRESSION: No bowel obstruction or free air. Moderate bowel content identified throughout colon.   Electronically Signed   By: Abelardo Diesel M.D.   On: 12/01/2014 12:27    ASSESSMENT/PLAN:   59 yo female with hx refractory c diff, currently on fidaxomicin. Has developed RUQ pain. HIDA nl. LFTs ok today.CT from last week with inflammation at hepatic flexure. Abd film today with moderate stool burden throughout colon. WBC 16.5, low grade temp.Will repeat CT abd/pelvis. Will speak to hospitalist about initiating transfer to [facility].     LOS: 1 day   Hvozdovic,  Deloris Ping 12/01/2014, Pager [phone]

## 2014-12-01 NOTE — Progress Notes (Signed)
[facility] Progress Note   Shelly Hood  ZOX:[id number]  DOB: 10-17-1955  DOA: 11/30/2014 PCP: Viviana Simpler, MD  Brief narrative: Shelly Hood is a 59 y.o. 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 The patient has had C. difficile colitis since April 20 16th. 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 Diarrhea recurred and she was placed on vancomycin on 8/22. She required a second course of vancomycin that was started on 9/14. She presented to the hospital with right upper quadrant pain. She states that she had had ongoing diarrhea prior to presentation to the hospital.  Subjective: Still complains of pain in her right upper abdomen. Has not had any diarrhea since being in the hospital. She is feeling nauseated but has not had any vomiting in the hospital.  Assessment/Plan: Principal Problem:   Clostridium difficile colitis/leukocytosis/sepsis -Fever 100.2 at this time -Started on the Fidaxomycin by ID - No diarrhea as of yet -Continue clear liquids-when necessary Zofran for nausea  Active Problems: Right upper quadrant pain - HIDA scan negative    Elevated LFTs -Possibly in relation to sepsis-improved today  Anorexia -Have ordered resource boost and ensure  Depression/anxiety -Continue Prozac and Xanax   Code Status:     Code Status Orders        Start     Ordered   11/30/14 0939  Full code   Continuous     11/30/14 0938    Advance Directive Documentation        Most Recent Value   Type of Advance Directive  Mental Health Advance Directive   Pre-existing out of facility DNR order (yellow form or pink MOST form)     "MOST" Form in Place?       Family Communication: Daughter and husband Disposition Plan: Monitor patient on  current treatment DVT prophylaxis: Lovenox Consultants: GI, ID  Antibiotics: Anti-infectives    Start     Dose/Rate Route Frequency Ordered Stop   12/01/14 1315  fidaxomicin (DIFICID) tablet 200 mg     200 mg Oral 2 times daily 12/01/14 1304     11/30/14 1500  metroNIDAZOLE (FLAGYL) IVPB 500 mg  Status:  Discontinued     500 mg 100 mL/hr over 60 Minutes Intravenous Every 8 hours 11/30/14 0939 11/30/14 1400   11/30/14 1315  vancomycin (VANCOCIN) 50 mg/mL oral solution 500 mg  Status:  Discontinued     500 mg Oral 4 times per day 11/30/14 1312 11/30/14 1312   11/30/14 1230  fidaxomicin (DIFICID) tablet 200 mg  Status:  Discontinued     200 mg Oral 2 times daily 11/30/14 1227 12/01/14 1304   11/30/14 0630  metroNIDAZOLE (FLAGYL) IVPB 500 mg     500 mg 100 mL/hr over 60 Minutes Intravenous  Once 11/30/14 0626 11/30/14 0756      Objective: Filed Weights   11/30/14 0105  Weight: 63.192 kg (139 lb 5 oz)    Intake/Output Summary (Last 24 hours) at 12/01/14 1506 Last data filed at 12/01/14 0528  Gross per 24 hour  Intake   1680 ml  Output   1600 ml  Net     80 ml     Vitals Filed Vitals:   11/30/14 1500 11/30/14 2026 11/30/14 2121 12/01/14 0553  BP: 133/52  137/65 133/51  Pulse:  71  78 73  Temp: 99.2 F (37.3 C) 99 F (37.2 C) 99.1 F (37.3 C) 99.3 F (37.4 C)  TempSrc: Oral Oral Oral Oral  Resp: 18  18 16   Height:      Weight:      SpO2: 93%  96% 97%    Exam:  General:  Pt is alert, not in acute distress  HEENT: No icterus, No thrush, oral mucosa moist  Cardiovascular: regular rate and rhythm, S1/S2 No murmur  Respiratory: clear to auscultation bilaterally   Abdomen: Soft, +Bowel sounds, tender in right upper quadrant and epigastrium, non distended, no guarding  MSK: No LE edema, cyanosis or clubbing  Data Reviewed: Basic Metabolic Panel:  Recent Labs Lab 11/25/14 0948 11/30/14 0116 11/30/14 1043 12/01/14 0522  NA 139 140  --  136  K 4.3 4.1  --   4.6  CL 105 103  --  102  CO2 25 27  --  27  GLUCOSE 102* 160*  --  148*  BUN 7 16  --  7  CREATININE 0.55 0.72 0.60 0.67  CALCIUM 9.1 9.2  --  9.0   Liver Function Tests:  Recent Labs Lab 11/25/14 0948 11/30/14 0116 12/01/14 0522  AST 39 48* 19  ALT 48 59* 36  ALKPHOS 114 131* 95  BILITOT 0.4 0.3 0.4  PROT 6.9 7.2 6.6  ALBUMIN 3.4* 3.5 2.8*    Recent Labs Lab 11/25/14 0948 11/30/14 0116  LIPASE 24 45   No results for input(s): AMMONIA in the last 168 hours. CBC:  Recent Labs Lab 11/25/14 0948 11/30/14 0116 11/30/14 1043 12/01/14 0522  WBC 7.4 14.6* 13.6* 16.5*  NEUTROABS 4.9  --   --   --   HGB 12.1 13.5 12.3 12.5  HCT 36.4 41.5 37.9 37.3  MCV 82.0 83.7 83.1 83.4  PLT 287 359 290 270   Cardiac Enzymes: No results for input(s): CKTOTAL, CKMB, CKMBINDEX, TROPONINI in the last 168 hours. BNP (last 3 results) No results for input(s): BNP in the last 8760 hours.  ProBNP (last 3 results) No results for input(s): PROBNP in the last 8760 hours.  CBG: No results for input(s): GLUCAP in the last 168 hours.  Recent Results (from the past 240 hour(s))  Clostridium Difficile by PCR     Status: Abnormal   Collection Time: 11/23/14  2:40 PM  Result Value Ref Range Status   Toxigenic C Difficile by pcr Detected (AA) Not Detected Final    Comment: This test is for use only with liquid or soft stools; performance characteristics of other clinical specimen types have not been established.   This assay was performed by Cepheid GeneXpert(R) PCR. The performance characteristics of this assay have been determined by [employer]. Performance characteristics refer to the analytical performance of the test.   Stool Culture     Status: None   Collection Time: 11/23/14  2:50 PM  Result Value Ref Range Status   Organism ID, Bacteria No Salmonella,Shigella,Campylobacter,Yersinia,or  Final   Organism ID, Bacteria No E.coli [id number]:H7 isolated.  Final      Studies: Dg Abd 1 View  11/30/2014   CLINICAL DATA:  RIGHT lower quadrant pain, recent history of C difficile colitis.  EXAM: ABDOMEN - 1 VIEW  COMPARISON:  CT abdomen and pelvis November 25, 2014  FINDINGS: The bowel gas pattern is normal. No radio-opaque calculi or other significant radiographic abnormality are seen. No free air. Soft tissue planes and included osseous structures are  nonsuspicious.  IMPRESSION: Negative.   Electronically Signed   By: Elon Alas M.D.   On: 11/30/2014 05:25   Nm Hepatobiliary Including Gb  12/01/2014   CLINICAL DATA:  Right upper quadrant abdominal pain with elevated liver function studies. History of gastric outlet obstruction and pyloric ulcer. Initial encounter.  EXAM: NUCLEAR MEDICINE HEPATOBILIARY IMAGING  TECHNIQUE: Sequential images of the abdomen were obtained out to 60 minutes following intravenous administration of radiopharmaceutical.  RADIOPHARMACEUTICALS:  5.6 mCi Tc-83m  Choletec IV  COMPARISON:  Abdominal CT 11/25/2014.  FINDINGS: There is homogeneous hepatic activity with spontaneous opacification of the gallbladder, biliary system and small bowel. No evidence of bile leak or obstruction.  IMPRESSION: Normal examination.  The cystic and common bile ducts are patent.   Electronically Signed   By: Richardean Sale M.D.   On: 12/01/2014 11:28   Dg Abd 2 Views  12/01/2014   CLINICAL DATA:  Right upper quadrant pain for 1 week.  EXAM: ABDOMEN - 2 VIEW  COMPARISON:  November 30, 2014  FINDINGS: The bowel gas pattern is normal. Moderate bowel content is identified in the colon. There is no evidence of free air. Surgical sutures identified in the left upper and mid abdomen. No radio-opaque calculi or other significant radiographic abnormality is seen. There is mild atelectasis of left lung base.  IMPRESSION: No bowel obstruction or free air. Moderate bowel content identified throughout colon.   Electronically Signed   By: Abelardo Diesel M.D.   On:  12/01/2014 12:27    Scheduled Meds:  Scheduled Meds: . ALPRAZolam  0.25 mg Oral Daily  . atorvastatin  20 mg Oral q1800  . enoxaparin (LOVENOX) injection  40 mg Subcutaneous Daily  . feeding supplement  1 Container Oral TID BM  . feeding supplement (ENSURE ENLIVE)  237 mL Oral TID WC  . fidaxomicin  200 mg Oral BID  . FLUoxetine  40 mg Oral Daily  . iohexol  25 mL Oral Q1 Hr x 2  . ondansetron  8 mg Intravenous 3 times per day  . ondansetron  4 mg Oral 5 X Daily  . saccharomyces boulardii  250 mg Oral BID   Continuous Infusions: . sodium chloride 75 mL/hr at 12/01/14 1324    Time spent on care of this patient: 35 min   Treasure, MD 12/01/2014, 3:06 PM  LOS: 1 day   [facility] Office  [phone] Pager - Text Page per [email or web address] If 7PM-7AM, please contact night-coverage [email or web address]

## 2014-12-02 ENCOUNTER — Inpatient Hospital Stay (HOSPITAL_COMMUNITY): Payer: BLUE CROSS/BLUE SHIELD

## 2014-12-02 ENCOUNTER — Encounter (HOSPITAL_COMMUNITY): Payer: Self-pay | Admitting: General Surgery

## 2014-12-02 DIAGNOSIS — K279 Peptic ulcer, site unspecified, unspecified as acute or chronic, without hemorrhage or perforation: Secondary | ICD-10-CM | POA: Diagnosis present

## 2014-12-02 DIAGNOSIS — IMO0002 Reserved for concepts with insufficient information to code with codable children: Secondary | ICD-10-CM

## 2014-12-02 DIAGNOSIS — F419 Anxiety disorder, unspecified: Secondary | ICD-10-CM

## 2014-12-02 HISTORY — DX: Peptic ulcer, site unspecified, unspecified as acute or chronic, without hemorrhage or perforation: K27.9

## 2014-12-02 LAB — CBC
HCT: 32.5 % — ABNORMAL LOW (ref 36.0–46.0)
Hemoglobin: 10.9 g/dL — ABNORMAL LOW (ref 12.0–15.0)
MCH: 27.9 pg (ref 26.0–34.0)
MCHC: 33.5 g/dL (ref 30.0–36.0)
MCV: 83.1 fL (ref 78.0–100.0)
PLATELETS: 309 10*3/uL (ref 150–400)
RBC: 3.91 MIL/uL (ref 3.87–5.11)
RDW: 15.4 % (ref 11.5–15.5)
WBC: 13.4 10*3/uL — ABNORMAL HIGH (ref 4.0–10.5)

## 2014-12-02 LAB — PROTIME-INR
INR: 1.2 (ref 0.00–1.49)
Prothrombin Time: 15.3 seconds — ABNORMAL HIGH (ref 11.6–15.2)

## 2014-12-02 LAB — APTT: aPTT: 28 seconds (ref 24–37)

## 2014-12-02 MED ORDER — FENTANYL CITRATE (PF) 100 MCG/2ML IJ SOLN
INTRAMUSCULAR | Status: AC | PRN
  Administered 2014-12-02 (×2): 25 ug via INTRAVENOUS
  Administered 2014-12-02: 50 ug via INTRAVENOUS

## 2014-12-02 MED ORDER — BISACODYL 10 MG RE SUPP: 10.0000 mg | Freq: Two times a day (BID) | RECTAL | Status: DC | PRN

## 2014-12-02 MED ORDER — ONDANSETRON HCL 4 MG/2ML IJ SOLN: 4.0000 mg | Freq: Four times a day (QID) | INTRAMUSCULAR | Status: DC | PRN

## 2014-12-02 MED ORDER — METOCLOPRAMIDE HCL 5 MG/ML IJ SOLN: 5.0000 mg | Freq: Four times a day (QID) | INTRAMUSCULAR | Status: DC | PRN

## 2014-12-02 MED ORDER — PANTOPRAZOLE SODIUM 40 MG PO TBEC: 40.0000 mg | DELAYED_RELEASE_TABLET | Freq: Two times a day (BID) | ORAL | Status: DC

## 2014-12-02 MED ORDER — ONDANSETRON 4 MG PO TBDP: 4.0000 mg | ORAL_TABLET | Freq: Four times a day (QID) | ORAL | Status: DC | PRN

## 2014-12-02 MED ORDER — LORAZEPAM 2 MG/ML IJ SOLN: 0.5000 mg | Freq: Three times a day (TID) | INTRAMUSCULAR | Status: DC | PRN

## 2014-12-02 MED ORDER — MIDAZOLAM HCL 2 MG/2ML IJ SOLN
INTRAMUSCULAR | Status: AC | PRN
  Administered 2014-12-02 (×2): 0.5 mg via INTRAVENOUS
  Administered 2014-12-02: 1 mg via INTRAVENOUS

## 2014-12-02 MED ORDER — KETOROLAC TROMETHAMINE 30 MG/ML IJ SOLN
30.0000 mg | Freq: Once | INTRAMUSCULAR | Status: AC
  Administered 2014-12-02: 30 mg via INTRAVENOUS
  Filled 2014-12-02: qty 1

## 2014-12-02 MED ORDER — ENOXAPARIN SODIUM 40 MG/0.4ML ~~LOC~~ SOLN
40.0000 mg | Freq: Every day | SUBCUTANEOUS | Status: DC
  Administered 2014-12-03 – 2014-12-04 (×2): 40 mg via SUBCUTANEOUS
  Filled 2014-12-02 (×2): qty 0.4

## 2014-12-02 MED ORDER — MORPHINE SULFATE (PF) 2 MG/ML IV SOLN: 2.0000 mg | INTRAVENOUS | Status: DC | PRN

## 2014-12-02 MED ORDER — LIP MEDEX EX OINT: 1.0000 "application " | TOPICAL_OINTMENT | Freq: Two times a day (BID) | CUTANEOUS | Status: DC

## 2014-12-02 MED ORDER — FENTANYL CITRATE (PF) 100 MCG/2ML IJ SOLN
INTRAMUSCULAR | Status: AC
  Filled 2014-12-02: qty 2

## 2014-12-02 MED ORDER — BLISTEX MEDICATED EX OINT
TOPICAL_OINTMENT | Freq: Two times a day (BID) | CUTANEOUS | Status: DC
  Administered 2014-12-02 – 2014-12-09 (×14): via TOPICAL
  Administered 2014-12-10: 1 via TOPICAL
  Filled 2014-12-02 (×2): qty 10

## 2014-12-02 MED ORDER — DIPHENHYDRAMINE HCL 50 MG/ML IJ SOLN: 12.5000 mg | Freq: Four times a day (QID) | INTRAMUSCULAR | Status: DC | PRN

## 2014-12-02 MED ORDER — MORPHINE SULFATE (PF) 2 MG/ML IV SOLN
2.0000 mg | INTRAVENOUS | Status: DC | PRN
  Administered 2014-12-02: 4 mg via INTRAVENOUS
  Administered 2014-12-02 – 2014-12-03 (×4): 2 mg via INTRAVENOUS
  Filled 2014-12-02: qty 2
  Filled 2014-12-02 (×2): qty 1

## 2014-12-02 MED ORDER — MAGIC MOUTHWASH
15.0000 mL | Freq: Four times a day (QID) | ORAL | Status: DC | PRN
  Administered 2014-12-06 – 2014-12-10 (×9): 15 mL via ORAL
  Filled 2014-12-02 (×12): qty 15

## 2014-12-02 MED ORDER — MIDAZOLAM HCL 2 MG/2ML IJ SOLN
INTRAMUSCULAR | Status: AC
  Filled 2014-12-02: qty 2

## 2014-12-02 MED ORDER — ACETAMINOPHEN 650 MG RE SUPP: 650.0000 mg | Freq: Four times a day (QID) | RECTAL | Status: DC | PRN

## 2014-12-02 MED ORDER — MORPHINE SULFATE (PF) 4 MG/ML IV SOLN
4.0000 mg | Freq: Once | INTRAVENOUS | Status: AC
  Administered 2014-12-02: 2 mg via INTRAVENOUS
  Filled 2014-12-02: qty 1

## 2014-12-02 MED ORDER — WHITE PETROLATUM GEL
Status: AC
  Filled 2014-12-02: qty 1

## 2014-12-02 MED ORDER — PANTOPRAZOLE SODIUM 40 MG PO TBEC
40.0000 mg | DELAYED_RELEASE_TABLET | Freq: Two times a day (BID) | ORAL | Status: DC
  Administered 2014-12-02: 40 mg via ORAL
  Filled 2014-12-02: qty 1

## 2014-12-02 MED ORDER — MORPHINE SULFATE (PF) 2 MG/ML IV SOLN
2.0000 mg | INTRAVENOUS | Status: DC | PRN
  Administered 2014-12-02 (×2): 2 mg via INTRAVENOUS
  Filled 2014-12-02 (×2): qty 1

## 2014-12-02 MED ORDER — METOPROLOL TARTRATE 1 MG/ML IV SOLN: 5.0000 mg | Freq: Four times a day (QID) | INTRAVENOUS | Status: DC | PRN

## 2014-12-02 MED ORDER — ZOLPIDEM TARTRATE 5 MG PO TABS: 5.0000 mg | ORAL_TABLET | Freq: Every evening | ORAL | Status: DC | PRN

## 2014-12-02 MED ORDER — PHENOL 1.4 % MT LIQD
2.0000 | OROMUCOSAL | Status: DC | PRN
  Administered 2014-12-09: 2 via OROMUCOSAL
  Filled 2014-12-02: qty 177

## 2014-12-02 MED ORDER — PROMETHAZINE HCL 25 MG/ML IJ SOLN: 6.2500 mg | INTRAMUSCULAR | Status: DC | PRN

## 2014-12-02 MED ORDER — SODIUM CHLORIDE 0.9 % IV SOLN
8.0000 mg | Freq: Four times a day (QID) | INTRAVENOUS | Status: DC | PRN
  Filled 2014-12-02: qty 4

## 2014-12-02 MED ORDER — PANTOPRAZOLE SODIUM 40 MG IV SOLR
40.0000 mg | Freq: Two times a day (BID) | INTRAVENOUS | Status: DC
  Administered 2014-12-02 – 2014-12-03 (×2): 40 mg via INTRAVENOUS
  Filled 2014-12-02: qty 40

## 2014-12-02 MED ORDER — MENTHOL 3 MG MT LOZG: 1.0000 | LOZENGE | OROMUCOSAL | Status: DC | PRN

## 2014-12-02 MED ORDER — LACTATED RINGERS IV BOLUS (SEPSIS): 1000.0000 mL | Freq: Three times a day (TID) | INTRAVENOUS | Status: DC | PRN

## 2014-12-02 MED ORDER — ALUM & MAG HYDROXIDE-SIMETH 200-200-20 MG/5ML PO SUSP: 30.0000 mL | Freq: Four times a day (QID) | ORAL | Status: DC | PRN

## 2014-12-02 NOTE — Telephone Encounter (Signed)
Please put a Referral in Epic for this patient as we will need the DX codes and reason for referral.

## 2014-12-02 NOTE — Consult Note (Addendum)
Reason for Consult: intra-abdominal abscess Referring Physician: Dr. Leota Jacobsen    HPI: Shelly Hood is a 59 year old female with a history of gastric outlet obstruction/pyloric structure likely likely from PUD s/p robotic truncal vagotomy, distal gastrectomy, roux-en-y and hiatal hernia repair by Dr. 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  She was admitted 11/30/14 with RUQ abdominal pain which started suddenly last Friday. She continues to have watery diarrhea as well.  Pain is severe and constant.  No aggravating or alleviating factors.  No modifying factors.  She denies NSAID use or tobacco use.  The pain does not radiate.  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 Baxter Flattery.  Initial WBC was 14.6k.   Gastroenterology was consulted who recommended Dificid and Florastor.  She also had a HIDA which was negative for cholecystitis.  She also had a CT scan which showed a phlegmon between the liver and stomach, perihepatic abscesses from anastomotic leak versus a perforated ulcer.  There was no leaking of contrast.   We have therefore been asked to evaluate.     Past Medical History  Diagnosis Date  . Panic attacks   . High cholesterol   . Obesity   . Gastric outlet obstruction   . Pyloric ulcer   . Depression   . GERD (gastroesophageal reflux disease)   . Pyloric stenosis   . Clostridium difficile infection     Past Surgical History  Procedure Laterality Date  . Rhinoplasty      X3  . Colonscopy  AGE 45  . Vagotomy N/A 06/24/2014    Procedure: ROBOTIC TRUNCAL VAGOTOMY ;  Surgeon: Michael Boston, MD;  Location: [facility];  Service: General;  Laterality: N/A;  This is [name]'s preference  . Antrectomy N/A 06/24/2014    Procedure: ROBOTIC DISTAL GASTRECTOMY;  Surgeon: Michael Boston, MD;  Location: [facility];  Service: General;  Laterality: N/A;  . Laparoscopic roux-en-y gastric bypass with  hiatal hernia repair N/A 06/24/2014    Procedure: ROBOTIC ROUX-EN-Y CONSTRUCTION WITH HIATAL HERNIA REPAIR;  Surgeon: Michael Boston, MD;  Location: [facility];  Service: General;  Laterality: N/A;  . Colonoscopy N/A 10/19/2014    Procedure: COLONOSCOPY;  Surgeon: Gatha Mayer, MD;  Location: [place];  Service: Endoscopy;  Laterality: N/A;  with FMT  . Fecal transplant N/A 10/19/2014    Procedure: FECAL TRANSPLANT;  Surgeon: Gatha Mayer, MD;  Location: [place];  Service: Endoscopy;  Laterality: N/A;    Family History  Problem Relation Age of Onset  . Diabetes Mother   . Diabetes Maternal Grandmother   . Heart disease Father   . Colon cancer Neg Hx     Social History:  reports that she quit smoking about 5 months ago. Her smoking use included Cigarettes. She has a 7.5 pack-year smoking history. She has never used smokeless tobacco. She reports that she does not drink alcohol or use illicit drugs.  Allergies:  Allergies  Allergen Reactions  . Cefotetan Other (See Comments)    Thrombocytopenia, plat ct 18K  . Nsaids     DUODENAL ULCER  . Codeine Sulfate     REACTION: nausea only tolerates hydrocodone  . Flagyl [Metronidazole] Other (See Comments)    Weak, bad taste     Medications:   No current facility-administered medications on file prior to encounter.   Current Outpatient Prescriptions on File  Prior to Encounter  Medication Sig Dispense Refill  . acetaminophen (TYLENOL) 500 MG tablet Take 1,000 mg by mouth every 6 (six) hours as needed for mild pain or fever (pain and fever).     . ALPRAZolam (XANAX) 0.25 MG tablet Take 0.25 mg by mouth daily.     Marland Kitchen atorvastatin (LIPITOR) 20 MG tablet Take 1 tablet (20 mg total) by mouth daily at 6 PM. (Patient taking differently: Take 20 mg by mouth daily. ) 90 tablet 3  . FLUoxetine (PROZAC) 40 MG capsule Take 1 capsule (40 mg total) by mouth daily. 90 capsule 3  . saccharomyces boulardii (FLORASTOR) 250 MG capsule Take 500 mg by  mouth daily.    . vancomycin (VANCOCIN HCL) 125 MG capsule Take 1 capsule (125 mg total) by mouth 4 (four) times daily. 56 capsule 0  . diphenoxylate-atropine (LOMOTIL) 2.5-0.025 MG per tablet Take 1 or 2 tablets prior to each meal as needed for diarrhea (Patient not taking: Reported on 11/10/2014) 90 tablet 0        Results for orders placed or performed during the hospital encounter of 11/30/14 (from the past 48 hour(s))  CBC     Status: Abnormal   Collection Time: 11/30/14 10:43 AM  Result Value Ref Range   WBC 13.6 (H) 4.0 - 10.5 K/uL   RBC 4.56 3.87 - 5.11 MIL/uL   Hemoglobin 12.3 12.0 - 15.0 g/dL   HCT 37.9 36.0 - 46.0 %   MCV 83.1 78.0 - 100.0 fL   MCH 27.0 26.0 - 34.0 pg   MCHC 32.5 30.0 - 36.0 g/dL   RDW 14.4 11.5 - 15.5 %   Platelets 290 150 - 400 K/uL  Creatinine, serum     Status: None   Collection Time: 11/30/14 10:43 AM  Result Value Ref Range   Creatinine, Ser 0.60 0.44 - 1.00 mg/dL   GFR calc non Af Amer >60 >60 mL/min   GFR calc Af Amer >60 >60 mL/min    Comment: (NOTE) The eGFR has been calculated using the CKD EPI equation. This calculation has not been validated in all clinical situations. eGFR's persistently <60 mL/min signify possible Chronic Kidney Disease.   CBC     Status: Abnormal   Collection Time: 12/01/14  5:22 AM  Result Value Ref Range   WBC 16.5 (H) 4.0 - 10.5 K/uL   RBC 4.47 3.87 - 5.11 MIL/uL   Hemoglobin 12.5 12.0 - 15.0 g/dL   HCT 37.3 36.0 - 46.0 %   MCV 83.4 78.0 - 100.0 fL   MCH 28.0 26.0 - 34.0 pg   MCHC 33.5 30.0 - 36.0 g/dL   RDW 15.0 11.5 - 15.5 %   Platelets 270 150 - 400 K/uL  Comprehensive metabolic panel     Status: Abnormal   Collection Time: 12/01/14  5:22 AM  Result Value Ref Range   Sodium 136 135 - 145 mmol/L   Potassium 4.6 3.5 - 5.1 mmol/L   Chloride 102 101 - 111 mmol/L   CO2 27 22 - 32 mmol/L   Glucose, Bld 148 (H) 65 - 99 mg/dL   BUN 7 6 - 20 mg/dL   Creatinine, Ser 0.67 0.44 - 1.00 mg/dL   Calcium 9.0 8.9  - 10.3 mg/dL   Total Protein 6.6 6.5 - 8.1 g/dL   Albumin 2.8 (L) 3.5 - 5.0 g/dL   AST 19 15 - 41 U/L   ALT 36 14 - 54 U/L   Alkaline Phosphatase 95 38 -  126 U/L   Total Bilirubin 0.4 0.3 - 1.2 mg/dL   GFR calc non Af Amer >60 >60 mL/min   GFR calc Af Amer >60 >60 mL/min    Comment: (NOTE) The eGFR has been calculated using the CKD EPI equation. This calculation has not been validated in all clinical situations. eGFR's persistently <60 mL/min signify possible Chronic Kidney Disease.    Anion gap 7 5 - 15  CBC     Status: Abnormal   Collection Time: 12/02/14  4:15 AM  Result Value Ref Range   WBC 13.4 (H) 4.0 - 10.5 K/uL   RBC 3.91 3.87 - 5.11 MIL/uL   Hemoglobin 10.9 (L) 12.0 - 15.0 g/dL   HCT 61.3 (L) 48.2 - 77.9 %   MCV 83.1 78.0 - 100.0 fL   MCH 27.9 26.0 - 34.0 pg   MCHC 33.5 30.0 - 36.0 g/dL   RDW 85.9 21.0 - 54.3 %   Platelets 309 150 - 400 K/uL  Protime-INR     Status: Abnormal   Collection Time: 12/02/14  7:59 AM  Result Value Ref Range   Prothrombin Time 15.3 (H) 11.6 - 15.2 seconds   INR 1.20 0.00 - 1.49  APTT     Status: None   Collection Time: 12/02/14  7:59 AM  Result Value Ref Range   aPTT 28 24 - 37 seconds    Nm Hepatobiliary Including Gb  12/01/2014   CLINICAL DATA:  Right upper quadrant abdominal pain with elevated liver function studies. History of gastric outlet obstruction and pyloric ulcer. Initial encounter.  EXAM: NUCLEAR MEDICINE HEPATOBILIARY IMAGING  TECHNIQUE: Sequential images of the abdomen were obtained out to 60 minutes following intravenous administration of radiopharmaceutical.  RADIOPHARMACEUTICALS:  5.6 mCi Tc-19m  Choletec IV  COMPARISON:  Abdominal CT 11/25/2014.  FINDINGS: There is homogeneous hepatic activity with spontaneous opacification of the gallbladder, biliary system and small bowel. No evidence of bile leak or obstruction.  IMPRESSION: Normal examination.  The cystic and common bile ducts are patent.   Electronically Signed   By:  Carey Bullocks M.D.   On: 12/01/2014 11:28   Ct Abdomen Pelvis W Contrast  12/01/2014   CLINICAL DATA:  Nausea, fever and right upper quadrant abdominal pain for 6 days. 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  EXAM: CT ABDOMEN AND PELVIS WITH CONTRAST  TECHNIQUE: Multidetector CT imaging of the abdomen and pelvis was performed using the standard protocol following bolus administration of intravenous contrast.  CONTRAST:  OMNIPAQUE IOHEXOL 300 MG/ML  SOLN  COMPARISON:  11/25/2014  FINDINGS: Lower chest: There is a new right-sided pleural effusion with overlying atelectasis. There is also a left basilar atelectasis. The heart is normal in size. No pericardial effusion. The distal esophagus is grossly normal.  Hepatobiliary: No focal hepatic lesions or intrahepatic biliary dilatation. There are this subdiaphragmatic abscesses anterior to the liver. The upper abscess measures a maximum 5 cm in the lower abscess measures a maximum of 7 cm. No intrahepatic abscess. There is inflammatory phlegmon in the right upper quadrant near the patient's prior gastric bypass surgery. I do not see any definite leaking oral contrast but I assume is a perforation that is sealed off. There is fluid between the stomach and the liver.  Pancreas: No mass, inflammation or ductal dilatation.  Spleen: No focal lesions.  Normal size.  Adrenals/Urinary Tract: Stable scarring changes involving the upper pole region of left kidney. The adrenal glands and right kidney are normal.  Stomach/Bowel: There is fairly marked wall thickening involving the residual stomach suggesting gastritis or peptic ulcer disease. A perforated peptic ulcer is possible but the jejunal loop of small bowel at the anastomosis appears normal. No findings for small bowel obstruction. No free air. The colon is unremarkable except for moderate stool.  Vascular/Lymphatic: Stable aortic calcifications. The branch vessels are patent. The major venous structures  are patent. Probable inflammatory/hyperplastic right upper quadrant lymph nodes. No retroperitoneal adenopathy.  Other: The uterus and ovaries are normal. The bladder is normal. No pelvic mass. There is a small amount of free pelvic fluid. No inguinal mass or adenopathy.  Musculoskeletal: No significant bony findings.  IMPRESSION: 1. Persistent inflammatory phlegmon involving the space between the liver and the stomach. Interval development of perihepatic abscesses as described above. This could be due to anastomotic breakdown along the hepatic margin of the stomach which has sealed off leak or perforated peptic ulcer. The stomach wall is markedly thickened. No obvious leaking oral contrast. 2. New right pleural effusion and overlying atelectasis.   Electronically Signed   By: Marijo Sanes M.D.   On: 12/01/2014 19:28   Dg Abd 2 Views  12/01/2014   CLINICAL DATA:  Right upper quadrant pain for 1 week.  EXAM: ABDOMEN - 2 VIEW  COMPARISON:  November 30, 2014  FINDINGS: The bowel gas pattern is normal. Moderate bowel content is identified in the colon. There is no evidence of free air. Surgical sutures identified in the left upper and mid abdomen. No radio-opaque calculi or other significant radiographic abnormality is seen. There is mild atelectasis of left lung base.  IMPRESSION: No bowel obstruction or free air. Moderate bowel content identified throughout colon.   Electronically Signed   By: Abelardo Diesel M.D.   On: 12/01/2014 12:27    Review of Systems  All other systems reviewed and are negative.  Blood pressure 130/66, pulse 70, temperature 98.4 F (36.9 C), temperature source Oral, resp. rate 17, height $RemoveBe'4\' 11"'XqTrtOhbS$  (1.499 m), weight 63.192 kg (139 lb 5 oz), SpO2 95 %. Physical Exam  Constitutional: She is oriented to person, place, and time. She appears well-developed and well-nourished. No distress.  Cardiovascular: Normal rate, regular rhythm, normal heart sounds and intact distal pulses.  Exam  reveals no gallop and no friction rub.   No murmur heard. Respiratory: Effort normal and breath sounds normal. No respiratory distress. She has no wheezes. She has no rales. She exhibits no tenderness.  GI: Soft. Bowel sounds are normal. She exhibits no mass. There is no rebound.  TTP RUQ  Musculoskeletal: Normal range of motion. She exhibits no edema or tenderness.  Neurological: She is alert and oriented to person, place, and time.  Skin: Skin is warm and dry. No rash noted. She is not diaphoretic. No erythema. No pallor.  Psychiatric: She has a normal mood and affect. Her behavior is normal. Judgment and thought content normal.    Assessment/Plan: S/p robotic gastric roux-en-y, vagotomy, hernia repair April 2016 Refractory c diff colitis Multiple perihepatic abscesses-agree with IR perc drain. Follow cultures.  Will leave her NPO for right now.  Will further discuss with Dr. Johney Maine whether we can feed her as she has no extravasation of contrast on CT scan.  Agree with Zosyn.  She is also on Dificid for refractory c diff colitis.  She does not appear toxic and no indications for surgical intervention. Thank you for the consult.  Will continue to follow.    RIEBOCK, EMINA ANP-BC  12/02/2014, 9:03 AM     Chart reviewed.  Studies reviewed.  Patient examined.  Patient 5 months out from a distal gastrectomy antrectomy in duodenal bulb resection for chronic obstructing ulcer.  Roux-en-Y reconstruction.  Vagotomy.  I have not seen her for several months.  Struggles with diarrhea  Had preop as well.  C Diff positive.  Recurred after several rounds of antibiotics.  Underwent pseudo-fecal transplant with [facility] for colonoscopy.  Colon didn't seem to be that inflamed then.  Patient developed sudden RIGHT upper quadrant abdominal pain.  Admitted on IV antibiotics.  Follow-up CAT scan shows encapsulated fluid collection front of the liver suspicious for abscess.  Mild phlegmon around porta hepatis with  questionable.of gas suspicious for abscess.  ot particularly close to the duodenal stump nor the GJ my mind.  The fluid collection as far away from both.  Radiology wonders if there is a leak.  That would be rather unusual 5 months out from surgery.  However the patient's stomach is rather thickened.  ?inflamed.  She has not been taking any antacid medicines such as PPIs.  I think she needs to be back on that.  Defer to GI but I suspect she should be on that indefinitely unless it is felt strongly this makes her possible treatment of C. Diff worse.  She does not recall taking PPIs after the 1st month.  She does have discomfort in the RIGHT upper quadrant with mild guarding but is not toxic.  No peritonitis elsewhere.  Agree with IV antibiotics.  Contuinue tobacco abstinence,  Agree with CT-guided or ultrasound-guided aspiration of fluid collection.  Consider fistula study through that.  Consider upper GI with Gastrografin to rule out obvious leak.  If no obstruction/leak, adv diet as tolerated.  If abscess resolved & no leak, consider upper endoscopy to evaluate stomach to rule out stricture at anastomosis or inflammation.  Antrectomy resection was benign.  Patient denies any obstructive like systems such as nausea or vomiting.  Denies any heartburn or reflux.  She tells me she has not been having any early satiety or bloating.  Eating relatively well.  That argues against it.  Nonetheless, would be more aggressive in evaluating that.  [name], M.D., F.A.C.S. Gastrointestinal and Minimally Invasive Surgery [facility] [address], [place], [place] [address] [phone] Main / Paging

## 2014-12-02 NOTE — Procedures (Signed)
Interventional Radiology Procedure Note  Procedure: US guided placement of perihepatic drainage catheter into bilious fluid collection of upper abdomen.  90cc of bilious fluid drained.  To drainage bag  Complications: None Recommendations:  - Ok to shower tomorrow - Do not submerge for 7 days - Routine care  - Record output - BID flushes with 10cc NS  Signed,  Dulcy Fanny. Earleen Newport, DO

## 2014-12-02 NOTE — Progress Notes (Signed)
[facility] Progress Note   Shelly Hood  ZOX:[id number]  DOB: Jul 03, 1955  DOA: 11/30/2014 PCP: Viviana Simpler, MD  Brief narrative: Shelly Hood is a 59 y.o. 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 The patient has had C. difficile colitis since April 20 16th. 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 Diarrhea recurred and she was placed on vancomycin on 8/22. She required a second course of vancomycin that was started on 9/14. She presented to the hospital with right upper quadrant pain. She states that she had had ongoing diarrhea prior to presentation to the hospital.  Subjective: Right upper quadrant pain is severe. No bowel movements in the past 24 hours. No nausea at this time. No fevers or chills.  Assessment/Plan: Principal Problem:   Clostridium difficile colitis/leukocytosis/fever/sepsis -Started on the Fidaxomycin by ID - No diarrhea as of yet -Continue clear liquids-when necessary Zofran for nausea  Active Problems: Right upper quadrant pain - CT scan reveals an perihepatic abscesses - Zosyn initiated last night -IR consulted for drainage -Follow up cultures -History of gastric ulcer resulting in a stricture in outlet obstruction status post s/p robotic truncal vagotomy, distal gastrectomy, roux-en-y and hiatal hernia repair by Dr. Johney Maine on 06/24/14,  -Question of whether she may have had a gastric perforation which resulted in abscesses-surgery consulted-no free air in abdomen noted at this time    Elevated LFTs -Possibly in relation to sepsis-improved   Anorexia -Have ordered resource boost and ensure  Depression/anxiety -Continue Prozac and Xanax   Code Status:     Code Status Orders        Start     Ordered    11/30/14 0939  Full code   Continuous     11/30/14 0938    Advance Directive Documentation        Most Recent Value   Type of Advance Directive  Mental Health Advance Directive   Pre-existing out of facility DNR order (yellow form or pink MOST form)     "MOST" Form in Place?       Family Communication: Daughter and husband DVT prophylaxis: Lovenox Consultants: GI, ID  Antibiotics: Anti-infectives    Start     Dose/Rate Route Frequency Ordered Stop   12/01/14 2230  piperacillin-tazobactam (ZOSYN) IVPB 3.375 g     3.375 g 12.5 mL/hr over 240 Minutes Intravenous 3 times per day 12/01/14 2138     12/01/14 1315  fidaxomicin (DIFICID) tablet 200 mg     200 mg Oral 2 times daily 12/01/14 1304     11/30/14 1500  metroNIDAZOLE (FLAGYL) IVPB 500 mg  Status:  Discontinued     500 mg 100 mL/hr over 60 Minutes Intravenous Every 8 hours 11/30/14 0939 11/30/14 1400   11/30/14 1315  vancomycin (VANCOCIN) 50 mg/mL oral solution 500 mg  Status:  Discontinued     500 mg Oral 4 times per day 11/30/14 1312 11/30/14 1312   11/30/14 1230  fidaxomicin (DIFICID) tablet 200 mg  Status:  Discontinued     200 mg Oral 2 times daily 11/30/14 1227 12/01/14 1304   11/30/14 0630  metroNIDAZOLE (FLAGYL) IVPB 500 mg     500 mg 100 mL/hr over 60 Minutes Intravenous  Once 11/30/14 0626 11/30/14 0756      Objective: [employer]  11/30/14 0105  Weight: 63.192 kg (139 lb 5 oz)    Intake/Output Summary (Last 24 hours) at 12/02/14 1313 Last data filed at 12/02/14 1100  Gross per 24 hour  Intake    800 ml  Output   1700 ml  Net   -900 ml     Vitals Filed Vitals:   12/01/14 1500 12/01/14 1515 12/01/14 2144 12/02/14 0508  BP: 135/64  136/53 130/66  Pulse: 74  70 70  Temp: 100.2 F (37.9 C) 98 F (36.7 C) 98.2 F (36.8 C) 98.4 F (36.9 C)  TempSrc: Oral  Oral Oral  Resp: 18  18 17   Height:      Weight:      SpO2: 96%  98% 95%    Exam:  General:  Pt is alert, not in acute distress  HEENT:  No icterus, No thrush, oral mucosa moist  Cardiovascular: regular rate and rhythm, S1/S2 No murmur  Respiratory: clear to auscultation bilaterally   Abdomen: Soft, +Bowel sounds, tender in right upper quadrant and epigastrium, non distended, no guarding  MSK: No LE edema, cyanosis or clubbing  Data Reviewed: Basic Metabolic Panel:  Recent Labs Lab 11/30/14 0116 11/30/14 1043 12/01/14 0522  NA 140  --  136  K 4.1  --  4.6  CL 103  --  102  CO2 27  --  27  GLUCOSE 160*  --  148*  BUN 16  --  7  CREATININE 0.72 0.60 0.67  CALCIUM 9.2  --  9.0   Liver Function Tests:  Recent Labs Lab 11/30/14 0116 12/01/14 0522  AST 48* 19  ALT 59* 36  ALKPHOS 131* 95  BILITOT 0.3 0.4  PROT 7.2 6.6  ALBUMIN 3.5 2.8*    Recent Labs Lab 11/30/14 0116  LIPASE 45   No results for input(s): AMMONIA in the last 168 hours. CBC:  Recent Labs Lab 11/30/14 0116 11/30/14 1043 12/01/14 0522 12/02/14 0415  WBC 14.6* 13.6* 16.5* 13.4*  HGB 13.5 12.3 12.5 10.9*  HCT 41.5 37.9 37.3 32.5*  MCV 83.7 83.1 83.4 83.1  PLT 359 290 270 309   Cardiac Enzymes: No results for input(s): CKTOTAL, CKMB, CKMBINDEX, TROPONINI in the last 168 hours. BNP (last 3 results) No results for input(s): BNP in the last 8760 hours.  ProBNP (last 3 results) No results for input(s): PROBNP in the last 8760 hours.  CBG: No results for input(s): GLUCAP in the last 168 hours.  Recent Results (from the past 240 hour(s))  Clostridium Difficile by PCR     Status: Abnormal   Collection Time: 11/23/14  2:40 PM  Result Value Ref Range Status   Toxigenic C Difficile by pcr Detected (AA) Not Detected Final    Comment: This test is for use only with liquid or soft stools; performance characteristics of other clinical specimen types have not been established.   This assay was performed by Cepheid GeneXpert(R) PCR. The performance characteristics of this assay have been determined by [employer].  Performance characteristics refer to the analytical performance of the test.   Stool Culture     Status: None   Collection Time: 11/23/14  2:50 PM  Result Value Ref Range Status   Organism ID, Bacteria No Salmonella,Shigella,Campylobacter,Yersinia,or  Final   Organism ID, Bacteria No E.coli [id number]:H7 isolated.  Final     Studies: Nm Hepatobiliary Including Gb  12/01/2014   CLINICAL DATA:  Right upper quadrant abdominal pain with elevated liver function studies. History of  gastric outlet obstruction and pyloric ulcer. Initial encounter.  EXAM: NUCLEAR MEDICINE HEPATOBILIARY IMAGING  TECHNIQUE: Sequential images of the abdomen were obtained out to 60 minutes following intravenous administration of radiopharmaceutical.  RADIOPHARMACEUTICALS:  5.6 mCi Tc-79m  Choletec IV  COMPARISON:  Abdominal CT 11/25/2014.  FINDINGS: There is homogeneous hepatic activity with spontaneous opacification of the gallbladder, biliary system and small bowel. No evidence of bile leak or obstruction.  IMPRESSION: Normal examination.  The cystic and common bile ducts are patent.   Electronically Signed   By: Richardean Sale M.D.   On: 12/01/2014 11:28   Ct Abdomen Pelvis W Contrast  12/01/2014   CLINICAL DATA:  Nausea, fever and right upper quadrant abdominal pain for 6 days. 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  EXAM: CT ABDOMEN AND PELVIS WITH CONTRAST  TECHNIQUE: Multidetector CT imaging of the abdomen and pelvis was performed using the standard protocol following bolus administration of intravenous contrast.  CONTRAST:  169mL OMNIPAQUE IOHEXOL 300 MG/ML  SOLN  COMPARISON:  11/25/2014  FINDINGS: Lower chest: There is a new right-sided pleural effusion with overlying atelectasis. There is also a left basilar atelectasis. The heart is normal in size. No pericardial effusion. The distal esophagus is grossly normal.  Hepatobiliary: No focal hepatic lesions or intrahepatic biliary dilatation. There are this  subdiaphragmatic abscesses anterior to the liver. The upper abscess measures a maximum 5 cm in the lower abscess measures a maximum of 7 cm. No intrahepatic abscess. There is inflammatory phlegmon in the right upper quadrant near the patient's prior gastric bypass surgery. I do not see any definite leaking oral contrast but I assume is a perforation that is sealed off. There is fluid between the stomach and the liver.  Pancreas: No mass, inflammation or ductal dilatation.  Spleen: No focal lesions.  Normal size.  Adrenals/Urinary Tract: Stable scarring changes involving the upper pole region of left kidney. The adrenal glands and right kidney are normal.  Stomach/Bowel: There is fairly marked wall thickening involving the residual stomach suggesting gastritis or peptic ulcer disease. A perforated peptic ulcer is possible but the jejunal loop of small bowel at the anastomosis appears normal. No findings for small bowel obstruction. No free air. The colon is unremarkable except for moderate stool.  Vascular/Lymphatic: Stable aortic calcifications. The branch vessels are patent. The major venous structures are patent. Probable inflammatory/hyperplastic right upper quadrant lymph nodes. No retroperitoneal adenopathy.  Other: The uterus and ovaries are normal. The bladder is normal. No pelvic mass. There is a small amount of free pelvic fluid. No inguinal mass or adenopathy.  Musculoskeletal: No significant bony findings.  IMPRESSION: 1. Persistent inflammatory phlegmon involving the space between the liver and the stomach. Interval development of perihepatic abscesses as described above. This could be due to anastomotic breakdown along the hepatic margin of the stomach which has sealed off leak or perforated peptic ulcer. The stomach wall is markedly thickened. No obvious leaking oral contrast. 2. New right pleural effusion and overlying atelectasis.   Electronically Signed   By: Marijo Sanes M.D.   On: 12/01/2014  19:28   Dg Abd 2 Views  12/01/2014   CLINICAL DATA:  Right upper quadrant pain for 1 week.  EXAM: ABDOMEN - 2 VIEW  COMPARISON:  November 30, 2014  FINDINGS: The bowel gas pattern is normal. Moderate bowel content is identified in the colon. There is no evidence of free air. Surgical sutures identified in the left upper and mid abdomen. No radio-opaque  calculi or other significant radiographic abnormality is seen. There is mild atelectasis of left lung base.  IMPRESSION: No bowel obstruction or free air. Moderate bowel content identified throughout colon.   Electronically Signed   By: Abelardo Diesel M.D.   On: 12/01/2014 12:27    Scheduled Meds:  Scheduled Meds: . ALPRAZolam  0.25 mg Oral Daily  . atorvastatin  20 mg Oral q1800  . enoxaparin (LOVENOX) injection  40 mg Subcutaneous Daily  . feeding supplement  1 Container Oral TID BM  . feeding supplement (ENSURE ENLIVE)  237 mL Oral TID WC  . fidaxomicin  200 mg Oral BID  . FLUoxetine  40 mg Oral Daily  . pantoprazole  40 mg Oral BID AC  . piperacillin-tazobactam (ZOSYN)  IV  3.375 g Intravenous 3 times per day  . saccharomyces boulardii  250 mg Oral BID   Continuous Infusions: . sodium chloride 100 mL/hr at 12/02/14 0818    Time spent on care of this patient: 35 min   [place], MD 12/02/2014, 1:13 PM  LOS: 2 days   [facility] Office  [phone] Pager - Text Page per [email or web address] If 7PM-7AM, please contact night-coverage [email or web address]

## 2014-12-02 NOTE — Consult Note (Signed)
Chief Complaint: Patient was seen in consultation today for RUQ abscess Chief Complaint  Patient presents with  . Abdominal Pain   at the request of [facility]  Referring Physician(s): Dr Redmond Pulling  History of Present Illness: Shelly Hood is a 59 y.o. female   Pt with Hx gastric outlet obstruction Bypass and hernia repair 06/2014 Hx C diff and recurrence Now with N/V/D; RUQ pain and presented to ED 9/21 with these sxs HIDA neg CT:  IMPRESSION: 1. Persistent inflammatory phlegmon involving the space between the liver and the stomach. Interval development of perihepatic abscesses as described above. This could be due to anastomotic breakdown along the hepatic margin of the stomach which has sealed off leak or perforated peptic ulcer. The stomach wall is markedly thickened. No obvious leaking oral contrast. 2. New right pleural effusion and overlying atelectasis.  Request made per [facility] for possible aspiration/drain placement Dr Earleen Newport has reviewed imaging and approves procedure  Past Medical History  Diagnosis Date  . Panic attacks   . High cholesterol   . Obesity   . Gastric outlet obstruction   . Pyloric ulcer   . Depression   . GERD (gastroesophageal reflux disease)   . Pyloric stenosis   . Clostridium difficile infection     Past Surgical History  Procedure Laterality Date  . Rhinoplasty      X3  . Colonscopy  AGE 66  . Vagotomy N/A 06/24/2014    Procedure: ROBOTIC TRUNCAL VAGOTOMY ;  Surgeon: Michael Boston, MD;  Location: [facility];  Service: General;  Laterality: N/A;  This is [name]'s preference  . Antrectomy N/A 06/24/2014    Procedure: ROBOTIC DISTAL GASTRECTOMY;  Surgeon: Michael Boston, MD;  Location: [facility];  Service: General;  Laterality: N/A;  . Laparoscopic roux-en-y gastric bypass with hiatal hernia repair N/A 06/24/2014    Procedure: ROBOTIC ROUX-EN-Y CONSTRUCTION WITH HIATAL HERNIA REPAIR;  Surgeon: Michael Boston, MD;  Location: [facility];  Service: General;   Laterality: N/A;  . Colonoscopy N/A 10/19/2014    Procedure: COLONOSCOPY;  Surgeon: Gatha Mayer, MD;  Location: [place];  Service: Endoscopy;  Laterality: N/A;  with FMT  . Fecal transplant N/A 10/19/2014    Procedure: FECAL TRANSPLANT;  Surgeon: Gatha Mayer, MD;  Location: [place];  Service: Endoscopy;  Laterality: N/A;    Allergies: Cefotetan; Nsaids; Codeine sulfate; and Flagyl  Medications: Prior to Admission medications   Medication Sig Start Date End Date Taking? Authorizing Provider  acetaminophen (TYLENOL) 500 MG tablet Take 1,000 mg by mouth every 6 (six) hours as needed for mild pain or fever (pain and fever).    Yes Historical Provider, MD  ALPRAZolam (XANAX) 0.25 MG tablet Take 0.25 mg by mouth daily.    Yes Historical Provider, MD  atorvastatin (LIPITOR) 20 MG tablet Take 1 tablet (20 mg total) by mouth daily at 6 PM. Patient taking differently: Take 20 mg by mouth daily.  04/27/14  Yes Venia Carbon, MD  FLUoxetine (PROZAC) 40 MG capsule Take 1 capsule (40 mg total) by mouth daily. 04/27/14  Yes Venia Carbon, MD  saccharomyces boulardii (FLORASTOR) 250 MG capsule Take 500 mg by mouth daily.   Yes Historical Provider, MD  vancomycin (VANCOCIN HCL) 125 MG capsule Take 1 capsule (125 mg total) by mouth 4 (four) times daily. 11/23/14  Yes Carlyle Basques, MD  diphenoxylate-atropine (LOMOTIL) 2.5-0.025 MG per tablet Take 1 or 2 tablets prior to each meal as needed for diarrhea Patient not  taking: Reported on 11/10/2014 11/04/14   Gatha Mayer, MD     Family History  Problem Relation Age of Onset  . Diabetes Mother   . Diabetes Maternal Grandmother   . Heart disease Father   . Colon cancer Neg Hx     Social History   Social History  . Marital Status: Married    Spouse Name: N/A  . Number of Children: 2  . Years of Education: N/A   Occupational History  . Real estate management     [employer] in [place] Topics  . Smoking  status: Former Smoker -- 0.25 packs/day for 30 years    Types: Cigarettes    Quit date: 06/18/2014  . Smokeless tobacco: Never Used  . Alcohol Use: No  . Drug Use: No  . Sexual Activity:    Partners: Male    Birth Control/ Protection: None   Other Topics Concern  . None   Social History Narrative     Review of Systems: A 12 point ROS discussed and pertinent positives are indicated in the HPI above.  All other systems are negative.  Review of Systems  Constitutional: Positive for fever, activity change, appetite change and fatigue.  Respiratory: Negative for cough.   Gastrointestinal: Positive for nausea, vomiting, abdominal pain, diarrhea and abdominal distention.  Neurological: Positive for weakness.  Psychiatric/Behavioral: Negative for behavioral problems and confusion.    Vital Signs: BP 130/66 mmHg  Pulse 70  Temp(Src) 98.4 F (36.9 C) (Oral)  Resp 17  Ht 4\' 11"  (1.499 m)  Wt 139 lb 5 oz (63.192 kg)  BMI 28.12 kg/m2  SpO2 95%  Physical Exam  Constitutional: She is oriented to person, place, and time.  Cardiovascular: Normal rate and regular rhythm.   Pulmonary/Chest: Effort normal and breath sounds normal. She has no wheezes.  Abdominal: Soft. Bowel sounds are normal. She exhibits distension. There is tenderness.  Musculoskeletal: Normal range of motion.  Neurological: She is alert and oriented to person, place, and time.  Skin: Skin is warm.  Psychiatric: She has a normal mood and affect. Her behavior is normal. Judgment and thought content normal.  Nursing note and vitals reviewed.   Mallampati Score:  MD Evaluation Airway: WNL Heart: WNL Abdomen: WNL Chest/ Lungs: WNL ASA  Classification: 2, 3 Mallampati/Airway Score: One  Imaging: Dg Abd 1 View  11/30/2014   CLINICAL DATA:  RIGHT lower quadrant pain, recent history of C difficile colitis.  EXAM: ABDOMEN - 1 VIEW  COMPARISON:  CT abdomen and pelvis November 25, 2014  FINDINGS: The bowel gas  pattern is normal. No radio-opaque calculi or other significant radiographic abnormality are seen. No free air. Soft tissue planes and included osseous structures are nonsuspicious.  IMPRESSION: Negative.   Electronically Signed   By: Elon Alas M.D.   On: 11/30/2014 05:25   Nm Hepatobiliary Including Gb  12/01/2014   CLINICAL DATA:  Right upper quadrant abdominal pain with elevated liver function studies. History of gastric outlet obstruction and pyloric ulcer. Initial encounter.  EXAM: NUCLEAR MEDICINE HEPATOBILIARY IMAGING  TECHNIQUE: Sequential images of the abdomen were obtained out to 60 minutes following intravenous administration of radiopharmaceutical.  RADIOPHARMACEUTICALS:  5.6 mCi Tc-28m  Choletec IV  COMPARISON:  Abdominal CT 11/25/2014.  FINDINGS: There is homogeneous hepatic activity with spontaneous opacification of the gallbladder, biliary system and small bowel. No evidence of bile leak or obstruction.  IMPRESSION: Normal examination.  The cystic and common bile ducts are  patent.   Electronically Signed   By: Richardean Sale M.D.   On: 12/01/2014 11:28   US Abdomen Complete  11/25/2014   CLINICAL DATA:  Right lower quadrant pain 2 days.  EXAM: ULTRASOUND ABDOMEN COMPLETE  COMPARISON:  None.  FINDINGS: Gallbladder: No gallstones or wall thickening visualized. No sonographic Murphy sign noted.  Common bile duct: Diameter: 3.0 mm.  Liver: 1.9 cm echogenic focus within the central liver likely a small hemangioma or focal fatty change.  IVC: No abnormality visualized.  Pancreas: Visualized portion unremarkable.  Spleen: Size and appearance within normal limits.  Right Kidney: Length: 10.8 cm. Echogenicity within normal limits. No mass or hydronephrosis visualized.  Left Kidney: Length: Lower limits of normal in size measuring 8.8 cm. Echogenicity within normal limits. No mass or hydronephrosis visualized.  Abdominal aorta: No aneurysm visualized.  Other findings: None.  IMPRESSION: No acute  hepatobiliary disease.  Focal echogenic center liver lesion measuring 1.9 cm likely a hemangioma versus focal fatty change.   Electronically Signed   By: Marin Olp M.D.   On: 11/25/2014 11:49   Ct Abdomen Pelvis W Contrast  12/01/2014   CLINICAL DATA:  Nausea, fever and right upper quadrant abdominal pain for 6 days. 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  EXAM: CT ABDOMEN AND PELVIS WITH CONTRAST  TECHNIQUE: Multidetector CT imaging of the abdomen and pelvis was performed using the standard protocol following bolus administration of intravenous contrast.  CONTRAST:  182mL OMNIPAQUE IOHEXOL 300 MG/ML  SOLN  COMPARISON:  11/25/2014  FINDINGS: Lower chest: There is a new right-sided pleural effusion with overlying atelectasis. There is also a left basilar atelectasis. The heart is normal in size. No pericardial effusion. The distal esophagus is grossly normal.  Hepatobiliary: No focal hepatic lesions or intrahepatic biliary dilatation. There are this subdiaphragmatic abscesses anterior to the liver. The upper abscess measures a maximum 5 cm in the lower abscess measures a maximum of 7 cm. No intrahepatic abscess. There is inflammatory phlegmon in the right upper quadrant near the patient's prior gastric bypass surgery. I do not see any definite leaking oral contrast but I assume is a perforation that is sealed off. There is fluid between the stomach and the liver.  Pancreas: No mass, inflammation or ductal dilatation.  Spleen: No focal lesions.  Normal size.  Adrenals/Urinary Tract: Stable scarring changes involving the upper pole region of left kidney. The adrenal glands and right kidney are normal.  Stomach/Bowel: There is fairly marked wall thickening involving the residual stomach suggesting gastritis or peptic ulcer disease. A perforated peptic ulcer is possible but the jejunal loop of small bowel at the anastomosis appears normal. No findings for small bowel obstruction. No free air. The colon  is unremarkable except for moderate stool.  Vascular/Lymphatic: Stable aortic calcifications. The branch vessels are patent. The major venous structures are patent. Probable inflammatory/hyperplastic right upper quadrant lymph nodes. No retroperitoneal adenopathy.  Other: The uterus and ovaries are normal. The bladder is normal. No pelvic mass. There is a small amount of free pelvic fluid. No inguinal mass or adenopathy.  Musculoskeletal: No significant bony findings.  IMPRESSION: 1. Persistent inflammatory phlegmon involving the space between the liver and the stomach. Interval development of perihepatic abscesses as described above. This could be due to anastomotic breakdown along the hepatic margin of the stomach which has sealed off leak or perforated peptic ulcer. The stomach wall is markedly thickened. No obvious leaking oral contrast. 2. New right pleural effusion and  overlying atelectasis.   Electronically Signed   By: Marijo Sanes M.D.   On: 12/01/2014 19:28   Ct Abdomen Pelvis W Contrast  11/25/2014   CLINICAL DATA:  Right upper quadrant pain for 3 4 days, Clostridium difficile infection for 5 weeks  EXAM: CT ABDOMEN AND PELVIS WITH CONTRAST  TECHNIQUE: Multidetector CT imaging of the abdomen and pelvis was performed using the standard protocol following bolus administration of intravenous contrast.  CONTRAST:  123mL OMNIPAQUE IOHEXOL 300 MG/ML  SOLN  COMPARISON:  None.  FINDINGS: Lower chest:  No significant opacities at the lung bases  Hepatobiliary: Negative  Pancreas: Negative  Spleen: Negative  Adrenals/Urinary Tract: Small nonobstructing calculi right kidney. Scarring upper pole left kidney.  Stomach/Bowel: Status post gastric bypass. Nonobstructive gas pattern. No significant smaller large bowel wall thickening. Gastrojejunostomy noted. Moderate inflammation in the vicinity of the hepatic flexure of the colon, primarily located in the abdominal fat immediately anterior and primarily cranial to  the flexure. No evidence of pneumatosis or pneumoperitoneum.  Vascular/Lymphatic: Mild aortic calcification.  Reproductive: No significant abnormalities  Other: No ascites  Musculoskeletal: negative  IMPRESSION: Acute inflammatory change in the fat medially anterior to the hepatic flexure of the colon. Differential diagnostic possibilities include postoperative fat necrosis, epiploic appendagitis, focal colitis, or mass, which is less likely.   Electronically Signed   By: Skipper Cliche M.D.   On: 11/25/2014 14:29   Dg Abd 2 Views  12/01/2014   CLINICAL DATA:  Right upper quadrant pain for 1 week.  EXAM: ABDOMEN - 2 VIEW  COMPARISON:  November 30, 2014  FINDINGS: The bowel gas pattern is normal. Moderate bowel content is identified in the colon. There is no evidence of free air. Surgical sutures identified in the left upper and mid abdomen. No radio-opaque calculi or other significant radiographic abnormality is seen. There is mild atelectasis of left lung base.  IMPRESSION: No bowel obstruction or free air. Moderate bowel content identified throughout colon.   Electronically Signed   By: Abelardo Diesel M.D.   On: 12/01/2014 12:27    Labs:  CBC:  Recent Labs  11/30/14 0116 11/30/14 1043 12/01/14 0522 12/02/14 0415  WBC 14.6* 13.6* 16.5* 13.4*  HGB 13.5 12.3 12.5 10.9*  HCT 41.5 37.9 37.3 32.5*  PLT 359 290 270 309    COAGS:  Recent Labs  12/02/14 0759  INR 1.20  APTT 28    BMP:  Recent Labs  10/06/14 1113 11/25/14 0948 11/30/14 0116 11/30/14 1043 12/01/14 0522  NA 139 139 140  --  136  K 4.8 4.3 4.1  --  4.6  CL 100 105 103  --  102  CO2 25 25 27   --  27  GLUCOSE 96 102* 160*  --  148*  BUN 7 7 16   --  7  CALCIUM 9.4 9.1 9.2  --  9.0  CREATININE 0.63 0.55 0.72 0.60 0.67  GFRNONAA >89 >60 >60 >60 >60  GFRAA >89 >60 >60 >60 >60    LIVER FUNCTION TESTS:  Recent Labs  09/13/14 1623 11/25/14 0948 11/30/14 0116 12/01/14 0522  BILITOT 0.3 0.4 0.3 0.4  AST 20 39  48* 19  ALT 17 48 59* 36  ALKPHOS 100 114 131* 95  PROT 7.2 6.9 7.2 6.6  ALBUMIN 3.5 3.4* 3.5 2.8*    TUMOR MARKERS: No results for input(s): AFPTM, CEA, CA199, CHROMGRNA in the last 8760 hours.  Assessment and Plan:  Gastric outlet obstr Post surgical bypass and  hernia repair 06/2014 Hx Cdiff with recurrence New abd pain; RUQ abscess Now scheduled for abscess asp/drain placement Risks and Benefits discussed with the patient including bleeding, infection, damage to adjacent structures, bowel perforation/fistula connection, and sepsis. All of the patient's questions were answered, patient is agreeable to proceed. Consent signed and in chart.   Thank you for this interesting consult.  I greatly enjoyed meeting Shelly Hood and look forward to participating in their care.  A copy of this report was sent to the requesting provider on this date.  Signed: TURPIN,PAMELA A 12/02/2014, 9:10 AM   I spent a total of 40 Minutes    in face to face in clinical consultation, greater than 50% of which was counseling/coordinating care for abd abscess

## 2014-12-02 NOTE — Progress Notes (Signed)
Progress Note   Subjective  Patient continues with right upper quadrant pain Receive morphine without benefit Formed bowel movement this morning CT scan findings again reviewed with patient plan for IR drainage today, she's to be seen by Dr. Johney Maine also   Objective   Vital signs in last 24 hours: Temp:  [98 F (36.7 C)-100.2 F (37.9 C)] 99.9 F (37.7 C) (09/23 1400) Pulse Rate:  [70-85] 85 (09/23 1400) Resp:  [17-19] 19 (09/23 1400) BP: (130-146)/(53-68) 146/68 mmHg (09/23 1400) SpO2:  [95 %-98 %] 95 % (09/23 0508) Last BM Date: 12/02/14 (small ) Gen: awake, alert, NAD HEENT: anicteric, op clear CV: RRR, no mrg Pulm: CTA b/l Abd: soft, RUQ tender, mild guarding, nondistended, +BS throughout Ext: no c/c/e Neuro: nonfocal   Intake/Output from previous day: 09/22 0701 - 09/23 0700 In: 920 [P.O.:120; I.V.:800] Out: 1400 [Urine:1400] Intake/Output this shift: Total I/O In: -  Out: 300 [Urine:300]  Lab Results:  Recent Labs  11/30/14 1043 12/01/14 0522 12/02/14 0415  WBC 13.6* 16.5* 13.4*  HGB 12.3 12.5 10.9*  HCT 37.9 37.3 32.5*  PLT 290 270 309   BMET  Recent Labs  11/30/14 0116 11/30/14 1043 12/01/14 0522  NA 140  --  136  K 4.1  --  4.6  CL 103  --  102  CO2 27  --  27  GLUCOSE 160*  --  148*  BUN 16  --  7  CREATININE 0.72 0.60 0.67  CALCIUM 9.2  --  9.0   LFT  Recent Labs  12/01/14 0522  PROT 6.6  ALBUMIN 2.8*  AST 19  ALT 36  ALKPHOS 95  BILITOT 0.4   PT/INR  Recent Labs  12/02/14 0759  LABPROT 15.3*  INR 1.20    Studies/Results: Nm Hepatobiliary Including Gb  12/01/2014   CLINICAL DATA:  Right upper quadrant abdominal pain with elevated liver function studies. History of gastric outlet obstruction and pyloric ulcer. Initial encounter.  EXAM: NUCLEAR MEDICINE HEPATOBILIARY IMAGING  TECHNIQUE: Sequential images of the abdomen were obtained out to 60 minutes following intravenous administration of radiopharmaceutical.   RADIOPHARMACEUTICALS:  5.6 mCi Tc-73m  Choletec IV  COMPARISON:  Abdominal CT 11/25/2014.  FINDINGS: There is homogeneous hepatic activity with spontaneous opacification of the gallbladder, biliary system and small bowel. No evidence of bile leak or obstruction.  IMPRESSION: Normal examination.  The cystic and common bile ducts are patent.   Electronically Signed   By: Richardean Sale M.D.   On: 12/01/2014 11:28   Ct Abdomen Pelvis W Contrast  12/01/2014   CLINICAL DATA:  Nausea, fever and right upper quadrant abdominal pain for 6 days. 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  EXAM: CT ABDOMEN AND PELVIS WITH CONTRAST  TECHNIQUE: Multidetector CT imaging of the abdomen and pelvis was performed using the standard protocol following bolus administration of intravenous contrast.  CONTRAST:  162mL OMNIPAQUE IOHEXOL 300 MG/ML  SOLN  COMPARISON:  11/25/2014  FINDINGS: Lower chest: There is a new right-sided pleural effusion with overlying atelectasis. There is also a left basilar atelectasis. The heart is normal in size. No pericardial effusion. The distal esophagus is grossly normal.  Hepatobiliary: No focal hepatic lesions or intrahepatic biliary dilatation. There are this subdiaphragmatic abscesses anterior to the liver. The upper abscess measures a maximum 5 cm in the lower abscess measures a maximum of 7 cm. No intrahepatic abscess. There is inflammatory phlegmon in the right upper quadrant near the patient's prior gastric  bypass surgery. I do not see any definite leaking oral contrast but I assume is a perforation that is sealed off. There is fluid between the stomach and the liver.  Pancreas: No mass, inflammation or ductal dilatation.  Spleen: No focal lesions.  Normal size.  Adrenals/Urinary Tract: Stable scarring changes involving the upper pole region of left kidney. The adrenal glands and right kidney are normal.  Stomach/Bowel: There is fairly marked wall thickening involving the residual stomach  suggesting gastritis or peptic ulcer disease. A perforated peptic ulcer is possible but the jejunal loop of small bowel at the anastomosis appears normal. No findings for small bowel obstruction. No free air. The colon is unremarkable except for moderate stool.  Vascular/Lymphatic: Stable aortic calcifications. The branch vessels are patent. The major venous structures are patent. Probable inflammatory/hyperplastic right upper quadrant lymph nodes. No retroperitoneal adenopathy.  Other: The uterus and ovaries are normal. The bladder is normal. No pelvic mass. There is a small amount of free pelvic fluid. No inguinal mass or adenopathy.  Musculoskeletal: No significant bony findings.  IMPRESSION: 1. Persistent inflammatory phlegmon involving the space between the liver and the stomach. Interval development of perihepatic abscesses as described above. This could be due to anastomotic breakdown along the hepatic margin of the stomach which has sealed off leak or perforated peptic ulcer. The stomach wall is markedly thickened. No obvious leaking oral contrast. 2. New right pleural effusion and overlying atelectasis.   Electronically Signed   By: Marijo Sanes M.D.   On: 12/01/2014 19:28   Dg Abd 2 Views  12/01/2014   CLINICAL DATA:  Right upper quadrant pain for 1 week.  EXAM: ABDOMEN - 2 VIEW  COMPARISON:  November 30, 2014  FINDINGS: The bowel gas pattern is normal. Moderate bowel content is identified in the colon. There is no evidence of free air. Surgical sutures identified in the left upper and mid abdomen. No radio-opaque calculi or other significant radiographic abnormality is seen. There is mild atelectasis of left lung base.  IMPRESSION: No bowel obstruction or free air. Moderate bowel content identified throughout colon.   Electronically Signed   By: Abelardo Diesel M.D.   On: 12/01/2014 12:27     Assessment / Plan:   59 year old female with refractory C. difficile with acute right upper quadrant pain  found to have anterior right upper quadrant abscess  1. RUQ abscess -- etiology unclear, possibly due to peptic ulcer and excluded stomach. Cannot totally exclude breakdown at surgical anastomosis. On broad-spectrum antibiotics with good GI coverage. IR to drain abscess today. Surgery is following. Care is to drain abscess and support with antibiotics. It appears to me the stomach pathology is from the excluded stomach which would not be able to be reached by upper endoscopy. Once much better, upper endoscopy can be considered to evaluate Roux-en-Y anatomy but would not be able to see distal stomach and the distal stomach to duodenum anastomosis. Am reluctant to use PPI given much trouble with C. difficile. Can use twice a day H2 blocker  2. 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 Continue Dificid. 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

## 2014-12-03 ENCOUNTER — Inpatient Hospital Stay (HOSPITAL_COMMUNITY): Payer: BLUE CROSS/BLUE SHIELD

## 2014-12-03 DIAGNOSIS — D72829 Elevated white blood cell count, unspecified: Secondary | ICD-10-CM | POA: Diagnosis present

## 2014-12-03 DIAGNOSIS — K219 Gastro-esophageal reflux disease without esophagitis: Secondary | ICD-10-CM

## 2014-12-03 DIAGNOSIS — R509 Fever, unspecified: Secondary | ICD-10-CM | POA: Diagnosis present

## 2014-12-03 LAB — BASIC METABOLIC PANEL
ANION GAP: 8 (ref 5–15)
BUN: 9 mg/dL (ref 6–20)
CHLORIDE: 103 mmol/L (ref 101–111)
CO2: 24 mmol/L (ref 22–32)
Calcium: 7.9 mg/dL — ABNORMAL LOW (ref 8.9–10.3)
Creatinine, Ser: 0.68 mg/dL (ref 0.44–1.00)
GFR calc non Af Amer: >60 mL/min (ref >60)
GLUCOSE: 126 mg/dL — AB (ref 65–99)
POTASSIUM: 3.6 mmol/L (ref 3.5–5.1)
Sodium: 135 mmol/L (ref 135–145)

## 2014-12-03 LAB — CBC
HEMATOCRIT: 31.7 % — AB (ref 36.0–46.0)
HEMOGLOBIN: 10.6 g/dL — AB (ref 12.0–15.0)
MCH: 27.7 pg (ref 26.0–34.0)
MCHC: 33.4 g/dL (ref 30.0–36.0)
MCV: 83 fL (ref 78.0–100.0)
Platelets: 234 10*3/uL (ref 150–400)
RBC: 3.82 MIL/uL — ABNORMAL LOW (ref 3.87–5.11)
RDW: 15.5 % (ref 11.5–15.5)
WBC: 20.7 10*3/uL — AB (ref 4.0–10.5)

## 2014-12-03 LAB — AMYLASE: AMYLASE: 53 U/L (ref 28–100)

## 2014-12-03 LAB — BILIRUBIN, TOTAL: BILIRUBIN TOTAL: 0.9 mg/dL (ref 0.3–1.2)

## 2014-12-03 MED ORDER — LACTATED RINGERS IV BOLUS (SEPSIS): 1000.0000 mL | Freq: Three times a day (TID) | INTRAVENOUS | Status: DC | PRN

## 2014-12-03 MED ORDER — TAB-A-VITE/IRON PO TABS
1.0000 | ORAL_TABLET | Freq: Every day | ORAL | Status: DC
  Administered 2014-12-03 – 2014-12-10 (×8): 1 via ORAL
  Filled 2014-12-03 (×8): qty 1

## 2014-12-03 MED ORDER — HYDROMORPHONE HCL 1 MG/ML IJ SOLN
0.5000 mg | INTRAMUSCULAR | Status: DC | PRN
  Filled 2014-12-03: qty 1

## 2014-12-03 MED ORDER — FAMOTIDINE 20 MG PO TABS
20.0000 mg | ORAL_TABLET | Freq: Two times a day (BID) | ORAL | Status: DC
  Administered 2014-12-03 – 2014-12-09 (×13): 20 mg via ORAL
  Filled 2014-12-03 (×13): qty 1

## 2014-12-03 MED ORDER — OXYCODONE HCL 5 MG PO TABS
5.0000 mg | ORAL_TABLET | ORAL | Status: DC | PRN
  Administered 2014-12-04 – 2014-12-09 (×12): 10 mg via ORAL
  Filled 2014-12-03 (×14): qty 2

## 2014-12-03 MED ORDER — IOHEXOL 300 MG/ML  SOLN
150.0000 mL | Freq: Once | INTRAMUSCULAR | Status: DC | PRN
  Administered 2014-12-03: 75 mL via ORAL
  Filled 2014-12-03: qty 150

## 2014-12-03 MED ORDER — FLUOXETINE HCL 20 MG PO CAPS
40.0000 mg | ORAL_CAPSULE | Freq: Two times a day (BID) | ORAL | Status: DC
  Administered 2014-12-03 – 2014-12-10 (×15): 40 mg via ORAL
  Filled 2014-12-03 (×15): qty 2

## 2014-12-03 MED ORDER — VITAMIN C 500 MG PO TABS
500.0000 mg | ORAL_TABLET | Freq: Two times a day (BID) | ORAL | Status: DC
  Administered 2014-12-03 – 2014-12-10 (×15): 500 mg via ORAL
  Filled 2014-12-03 (×16): qty 1

## 2014-12-03 MED ORDER — MORPHINE SULFATE (PF) 2 MG/ML IV SOLN
1.0000 mg | INTRAVENOUS | Status: DC | PRN
  Administered 2014-12-03 (×2): 4 mg via INTRAVENOUS
  Administered 2014-12-05 (×2): 2 mg via INTRAVENOUS
  Filled 2014-12-03: qty 1
  Filled 2014-12-03 (×2): qty 2
  Filled 2014-12-03: qty 1

## 2014-12-03 NOTE — Progress Notes (Signed)
[facility]    Date of Admission:  11/30/2014   Total days of antibiotics 4        Day 4 fidoxamicin        Day 2 piptazo           ID: BRYONNA SUNDBY is a 59 y.o. female with  Hx of refractory c.difficile presents with new onset RUQ pain, mild transaminitis found to have perihepatic abscess 5 x 7cm concern for etiology, perf peptic ulcer vs. 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 She underwent UGI series study this afternoon that did not show any evidence of contrast leaking.  She has RUQ pain improved slightly. She is less nauseated. She had 1 semi formed BM yesterday, no other watery diarrhea.  Labs: WBC trending up from 18 to 20  Medications:  . ALPRAZolam  0.25 mg Oral Daily  . atorvastatin  20 mg Oral q1800  . enoxaparin (LOVENOX) injection  40 mg Subcutaneous Daily  . famotidine  20 mg Oral BID  . fidaxomicin  200 mg Oral BID  . FLUoxetine  40 mg Oral BID  . lip balm   Topical BID  . multivitamins with iron  1 tablet Oral Daily  . piperacillin-tazobactam (ZOSYN)  IV  3.375 g Intravenous 3 times per day  . saccharomyces boulardii  250 mg Oral BID  . vitamin C  500 mg Oral BID    Objective: Vital signs in last 24 hours: Temp:  [98.9 F (37.2 C)-101.8 F (38.8 C)] 101.8 F (38.8 C) (09/24 1333) Pulse Rate:  [82-91] 82 (09/24 1333) Resp:  [15-22] 18 (09/24 1333) BP: (121-154)/(48-70) 131/57 mmHg (09/24 1333) SpO2:  [87 %-96 %] 90 % (09/24 1333) gen = a xo by 3 in NAD sitting up in bed,wearing [place] HEENT= MMM, no signs of thrush Cors  = NL s1,s2, no g/m/r pulm = CTAB no w/c/r abd = TTP in RUQ, drain in place with bilious fluid in bag. Soft bowel sounds, hypoactive Skin = warm, no c/c/e Ext = no edema Psych = pleasant, fatigue appearing  Lab Results  Recent Labs  12/01/14 0522 12/02/14 0415 12/03/14 0422  WBC 16.5* 13.4* 20.7*  HGB 12.5 10.9* 10.6*  HCT 37.3 32.5* 31.7*  NA 136  --  135  K 4.6  --  3.6  CL 102  --  103  CO2 27  --  24  BUN 7  --  9  CREATININE 0.67  --  0.68   Liver Panel  Recent Labs  12/01/14 0522 12/03/14 1219  PROT 6.6  --   ALBUMIN 2.8*  --   AST 19  --   ALT 36  --   ALKPHOS 95  --   BILITOT 0.4 0.9    Studies/Results: Ct Abdomen Pelvis W Contrast  12/01/2014   CLINICAL DATA:  Nausea, fever and right upper quadrant abdominal pain for 6 days. 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  EXAM: CT ABDOMEN AND PELVIS WITH CONTRAST  TECHNIQUE: Multidetector CT imaging of the abdomen  and pelvis was performed using the standard protocol following bolus administration of intravenous contrast.  CONTRAST:  156mL OMNIPAQUE IOHEXOL 300 MG/ML  SOLN  COMPARISON:  11/25/2014  FINDINGS: Lower chest: There is a new right-sided pleural effusion with overlying atelectasis. There is also a left basilar atelectasis. The heart is normal in size. No pericardial effusion. The distal esophagus is grossly normal.  Hepatobiliary: No focal hepatic lesions or intrahepatic biliary dilatation. There are this subdiaphragmatic abscesses anterior to the liver. The upper abscess measures a maximum 5 cm in the lower abscess measures a maximum of 7 cm. No intrahepatic abscess. There is inflammatory phlegmon in the right upper quadrant near the patient's prior gastric bypass surgery. I do not see any definite leaking oral contrast but I assume is a perforation that is sealed off. There is fluid between the stomach and the liver.  Pancreas: No mass, inflammation or ductal dilatation.  Spleen: No focal lesions.  Normal  size.  Adrenals/Urinary Tract: Stable scarring changes involving the upper pole region of left kidney. The adrenal glands and right kidney are normal.  Stomach/Bowel: There is fairly marked wall thickening involving the residual stomach suggesting gastritis or peptic ulcer disease. A perforated peptic ulcer is possible but the jejunal loop of small bowel at the anastomosis appears normal. No findings for small bowel obstruction. No free air. The colon is unremarkable except for moderate stool.  Vascular/Lymphatic: Stable aortic calcifications. The branch vessels are patent. The major venous structures are patent. Probable inflammatory/hyperplastic right upper quadrant lymph nodes. No retroperitoneal adenopathy.  Other: The uterus and ovaries are normal. The bladder is normal. No pelvic mass. There is a small amount of free pelvic fluid. No inguinal mass or adenopathy.  Musculoskeletal: No significant bony findings.  IMPRESSION: 1. Persistent inflammatory phlegmon involving the space between the liver and the stomach. Interval development of perihepatic abscesses as described above. This could be due to anastomotic breakdown along the hepatic margin of the stomach which has sealed off leak or perforated peptic ulcer. The stomach wall is markedly thickened. No obvious leaking oral contrast. 2. New right pleural effusion and overlying atelectasis.   Electronically Signed   By: Marijo Sanes M.D.   On: 12/01/2014 19:28   Dg Duanne Limerick W/water Sol Cm  12/03/2014   CLINICAL DATA:  Abscesses around the stomach and liver. History of peptic ulcer disease and distal antrectomy with gastrojejunostomy. Concern for anastomotic leak.  EXAM: WATER SOLUBLE UPPER GI SERIES  TECHNIQUE: Single-column upper GI series was performed using water soluble contrast.  CONTRAST:  39mL OMNIPAQUE IOHEXOL 300 MG/ML  SOLN  COMPARISON:  CT 12/01/2014.  FLUOROSCOPY TIME:  Radiation Exposure Index (as provided by the fluoroscopic device):  If the device  does not provide the exposure index:  Fluoroscopy Time (in minutes and seconds):  1 minutes 24 seconds  Number of Acquired Images:  18  FINDINGS: No esophageal stricture. No evidence of leak from the stomach. There is mild fold thickening in the distal stomach suggesting gastritis. There is prompt emptying of the stomach into the jejunum. No anastomotic leak. No evidence of small bowel obstruction.  IMPRESSION: No evidence of anastomotic leak or obstruction. Mild fold thickening in the distal stomach likely reflects gastritis.   Electronically Signed   By: Rolm Baptise M.D.   On: 12/03/2014 12:53   [place] Image Guided Drainage By Percutaneous Catheter  12/02/2014   CLINICAL DATA:  59 year old female with a history of prior gastric bypass procedure with subsequent postoperative fluid  collection in the upper abdomen. She has been referred for drainage.  EXAM: ULTRASOUND GUIDED DRAINAGE OF RIGHT UPPER QUADRANT FLUID COLLECTION  MEDICATIONS: 2.0 mg IV Versed; 200 mcg IV Fentanyl  Total Moderate Sedation Time: 20  PROCEDURE: The procedure, risks, benefits, and alternatives were explained to the patient. Questions regarding the procedure were encouraged and answered. The patient understands and consents to the procedure.  Patient is position supine position on a stretcher. Ultrasound survey of the upper abdomen was performed with images stored and sent to PACs.  The right upper quadrant was prepped with chlorhexidine in a sterile fashion, and a sterile drape was applied covering the operative field. A sterile gown and sterile gloves were used for the procedure. Local anesthesia was provided with 1% Lidocaine.  Using ultrasound guidance the skin and subcutaneous tissues were generously infiltrated with 1% lidocaine to the level of the abdominal wall.  Using ultrasound imaging a 10 French drain was advanced into the fluid collection using a trocar technique.  Once the drain was in place approximately 90 cc of bilious fluid  was aspirated. A sample sent to the lab.  Retention suture was placed in the drain was placed to gravity bag.  Patient tolerated the procedure well and remained hemodynamically stable throughout.  No complications encountered and no significant blood loss encounter.  COMPLICATIONS: None.  FINDINGS: Ultrasound images demonstrate fluid collection overlying the liver.  90 cc of bilious fluid was drained from the fluid collection.  IMPRESSION: Status post ultrasound-guided drain placement into right upper quadrant fluid collection with 90 cc of bilious fluid aspirated.  Signed,  Dulcy Fanny. Earleen Newport, DO  [facility]  [facility]   Electronically Signed   By: Corrie Mckusick D.O.   On: 12/02/2014 18:08     Assessment/Plan: Right upper quadrant perihepatic abscess = continue with piptazo. If she has persistent fevers today, recommend adding vancomycin. Will follow up with aspirate cx  Refractory c.difficile = appears at [place] for now. Continue with fidaxomicin. Leukocytosis maybe due to worsening of cdifficile due to antibiotic re-exposure  Nausea = improved, continue with current regimen  Fevers/leukocytosis = anticipate that it should trend down since drain placement. If continues to worsen, we will broaden antibiotics  [facility], [facility] Cell: [phone] Pager: [phone]  12/03/2014, 3:04 PM

## 2014-12-03 NOTE — Progress Notes (Signed)
Subjective: Complains of RUQ pain - Stable  Objective: Vital signs in last 24 hours: Temp:  [98.9 F (37.2 C)-100.5 F (38.1 C)] 99 F (37.2 C) (09/24 0526) Pulse Rate:  [83-91] 83 (09/24 0526) Resp:  [15-22] 20 (09/24 0526) BP: (121-154)/(48-70) 121/48 mmHg (09/24 0526) SpO2:  [87 %-96 %] 96 % (09/24 0526) Last BM Date: 12/02/14  Intake/Output from previous day: 09/23 0701 - 09/24 0700 In: 805 [I.V.:800] Out: 815 [Urine:650; Drains:75] Intake/Output this shift:    Resp: clear to auscultation bilaterally Cardio: regular rate and rhythm GI: soft and nontender except in RUQ. drain output bilious  Lab Results:   Recent Labs  12/02/14 0415 12/03/14 0422  WBC 13.4* 20.7*  HGB 10.9* 10.6*  HCT 32.5* 31.7*  PLT 309 234   BMET  Recent Labs  12/01/14 0522 12/03/14 0422  NA 136 135  K 4.6 3.6  CL 102 103  CO2 27 24  GLUCOSE 148* 126*  BUN 7 9  CREATININE 0.67 0.68  CALCIUM 9.0 7.9*   PT/INR  Recent Labs  12/02/14 0759  LABPROT 15.3*  INR 1.20   ABG No results for input(s): PHART, HCO3 in the last 72 hours.  Invalid input(s): PCO2, PO2  Studies/Results: Nm Hepatobiliary Including Gb  12/01/2014   CLINICAL DATA:  Right upper quadrant abdominal pain with elevated liver function studies. History of gastric outlet obstruction and pyloric ulcer. Initial encounter.  EXAM: NUCLEAR MEDICINE HEPATOBILIARY IMAGING  TECHNIQUE: Sequential images of the abdomen were obtained out to 60 minutes following intravenous administration of radiopharmaceutical.  RADIOPHARMACEUTICALS:  5.6 mCi Tc-87m  Choletec IV  COMPARISON:  Abdominal CT 11/25/2014.  FINDINGS: There is homogeneous hepatic activity with spontaneous opacification of the gallbladder, biliary system and small bowel. No evidence of bile leak or obstruction.  IMPRESSION: Normal examination.  The cystic and common bile ducts are patent.   Electronically Signed   By: Richardean Sale M.D.   On: 12/01/2014 11:28   Ct  Abdomen Pelvis W Contrast  12/01/2014   CLINICAL DATA:  Nausea, fever and right upper quadrant abdominal pain for 6 days. 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  EXAM: CT ABDOMEN AND PELVIS WITH CONTRAST  TECHNIQUE: Multidetector CT imaging of the abdomen and pelvis was performed using the standard protocol following bolus administration of intravenous contrast.  CONTRAST:  161mL OMNIPAQUE IOHEXOL 300 MG/ML  SOLN  COMPARISON:  11/25/2014  FINDINGS: Lower chest: There is a new right-sided pleural effusion with overlying atelectasis. There is also a left basilar atelectasis. The heart is normal in size. No pericardial effusion. The distal esophagus is grossly normal.  Hepatobiliary: No focal hepatic lesions or intrahepatic biliary dilatation. There are this subdiaphragmatic abscesses anterior to the liver. The upper abscess measures a maximum 5 cm in the lower abscess measures a maximum of 7 cm. No intrahepatic abscess. There is inflammatory phlegmon in the right upper quadrant near the patient's prior gastric bypass surgery. I do not see any definite leaking oral contrast but I assume is a perforation that is sealed off. There is fluid between the stomach and the liver.  Pancreas: No mass, inflammation or ductal dilatation.  Spleen: No focal lesions.  Normal size.  Adrenals/Urinary Tract: Stable scarring changes involving the upper pole region of left kidney. The adrenal glands and right kidney are normal.  Stomach/Bowel: There is fairly marked wall thickening involving the residual stomach suggesting gastritis or peptic ulcer disease. A perforated peptic ulcer is possible but the jejunal loop  of small bowel at the anastomosis appears normal. No findings for small bowel obstruction. No free air. The colon is unremarkable except for moderate stool.  Vascular/Lymphatic: Stable aortic calcifications. The branch vessels are patent. The major venous structures are patent. Probable inflammatory/hyperplastic  right upper quadrant lymph nodes. No retroperitoneal adenopathy.  Other: The uterus and ovaries are normal. The bladder is normal. No pelvic mass. There is a small amount of free pelvic fluid. No inguinal mass or adenopathy.  Musculoskeletal: No significant bony findings.  IMPRESSION: 1. Persistent inflammatory phlegmon involving the space between the liver and the stomach. Interval development of perihepatic abscesses as described above. This could be due to anastomotic breakdown along the hepatic margin of the stomach which has sealed off leak or perforated peptic ulcer. The stomach wall is markedly thickened. No obvious leaking oral contrast. 2. New right pleural effusion and overlying atelectasis.   Electronically Signed   By: Marijo Sanes M.D.   On: 12/01/2014 19:28   Dg Abd 2 Views  12/01/2014   CLINICAL DATA:  Right upper quadrant pain for 1 week.  EXAM: ABDOMEN - 2 VIEW  COMPARISON:  November 30, 2014  FINDINGS: The bowel gas pattern is normal. Moderate bowel content is identified in the colon. There is no evidence of free air. Surgical sutures identified in the left upper and mid abdomen. No radio-opaque calculi or other significant radiographic abnormality is seen. There is mild atelectasis of left lung base.  IMPRESSION: No bowel obstruction or free air. Moderate bowel content identified throughout colon.   Electronically Signed   By: Abelardo Diesel M.D.   On: 12/01/2014 12:27   [place] Image Guided Drainage By Percutaneous Catheter  12/02/2014   CLINICAL DATA:  59 year old female with a history of prior gastric bypass procedure with subsequent postoperative fluid collection in the upper abdomen. She has been referred for drainage.  EXAM: ULTRASOUND GUIDED DRAINAGE OF RIGHT UPPER QUADRANT FLUID COLLECTION  MEDICATIONS: 2.0 mg IV Versed; 200 mcg IV Fentanyl  Total Moderate Sedation Time: 20  PROCEDURE: The procedure, risks, benefits, and alternatives were explained to the patient. Questions regarding the  procedure were encouraged and answered. The patient understands and consents to the procedure.  Patient is position supine position on a stretcher. Ultrasound survey of the upper abdomen was performed with images stored and sent to PACs.  The right upper quadrant was prepped with chlorhexidine in a sterile fashion, and a sterile drape was applied covering the operative field. A sterile gown and sterile gloves were used for the procedure. Local anesthesia was provided with 1% Lidocaine.  Using ultrasound guidance the skin and subcutaneous tissues were generously infiltrated with 1% lidocaine to the level of the abdominal wall.  Using ultrasound imaging a 10 French drain was advanced into the fluid collection using a trocar technique.  Once the drain was in place approximately 90 cc of bilious fluid was aspirated. A sample sent to the lab.  Retention suture was placed in the drain was placed to gravity bag.  Patient tolerated the procedure well and remained hemodynamically stable throughout.  No complications encountered and no significant blood loss encounter.  COMPLICATIONS: None.  FINDINGS: Ultrasound images demonstrate fluid collection overlying the liver.  90 cc of bilious fluid was drained from the fluid collection.  IMPRESSION: Status post ultrasound-guided drain placement into right upper quadrant fluid collection with 90 cc of bilious fluid aspirated.  Signed,  Dulcy Fanny. Earleen Newport, DO  [facility]  [facility]  Electronically Signed   By: Corrie Mckusick D.O.   On: 12/02/2014 18:08    Anti-infectives: Anti-infectives    Start     Dose/Rate Route Frequency Ordered Stop   12/01/14 2230  piperacillin-tazobactam (ZOSYN) IVPB 3.375 g     3.375 g 12.5 mL/hr over 240 Minutes Intravenous 3 times per day 12/01/14 2138     12/01/14 1315  fidaxomicin (DIFICID) tablet 200 mg     200 mg Oral 2 times daily 12/01/14 1304     11/30/14 1500  metroNIDAZOLE (FLAGYL) IVPB 500  mg  Status:  Discontinued     500 mg 100 mL/hr over 60 Minutes Intravenous Every 8 hours 11/30/14 0939 11/30/14 1400   11/30/14 1315  vancomycin (VANCOCIN) 50 mg/mL oral solution 500 mg  Status:  Discontinued     500 mg Oral 4 times per day 11/30/14 1312 11/30/14 1312   11/30/14 1230  fidaxomicin (DIFICID) tablet 200 mg  Status:  Discontinued     200 mg Oral 2 times daily 11/30/14 1227 12/01/14 1304   11/30/14 0630  metroNIDAZOLE (FLAGYL) IVPB 500 mg     500 mg 100 mL/hr over 60 Minutes Intravenous  Once 11/30/14 0626 11/30/14 0756      Assessment/Plan: s/p * No surgery found * Continue zosyn. wbc increasing. continue drain and monitor  Swallow study today to rule out leak from stomach  LOS: 3 days    TOTH III,PAUL S 12/03/2014

## 2014-12-03 NOTE — Progress Notes (Signed)
[facility] Progress Note   Shelly Hood  JJK:[id number]  DOB: Mar 15, 1955  DOA: 11/30/2014 PCP: Viviana Simpler, MD  Brief narrative: Shelly Hood is a 59 y.o. 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 The patient has had C. difficile colitis since April 20 16th. 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 Diarrhea recurred and she was placed on vancomycin on 8/22. She required a second course of vancomycin that was started on 9/14. She presented to the hospital with right upper quadrant pain. She states that she had had ongoing diarrhea prior to presentation to the hospital.  Subjective: Right upper quadrant pain improved- still sore. No nausea, 1 Bm today- stool was loose. No BMs yesterday. No fevers or chills.   Assessment/Plan: Principal Problem:   Clostridium difficile colitis/leukocytosis/fever/sepsis -Started on the Fidaxomycin by ID - No diarrhea as of yet -when necessary Zofran for nausea  Active Problems: Right upper quadrant pain - CT scan reveals perihepatic abscesses - Zosyn initiated last night -IR consulted for drainage -Follow up cultures -History of gastric ulcer resulting in a stricture in outlet obstruction status post s/p robotic truncal vagotomy, distal gastrectomy, roux-en-y and hiatal hernia repair by Dr. Johney Maine on 06/24/14,  -Question of whether she may have had a gastric perforation which resulted in abscesses, f/u Upper GI with Gastrograffin - NPO for now    Elevated LFTs -Possibly in relation to sepsis-improved   Anorexia -Have ordered resource boost and ensure  Depression/anxiety -Continue Prozac and Xanax   Code Status:     Code Status Orders        Start     Ordered   11/30/14 0939  Full code    Continuous     11/30/14 0938    Advance Directive Documentation        Most Recent Value   Type of Advance Directive  Mental Health Advance Directive   Pre-existing out of facility DNR order (yellow form or pink MOST form)     "MOST" Form in Place?       Family Communication: Daughter and husband DVT prophylaxis: Lovenox Consultants: GI, ID, gen surgery  Antibiotics: Anti-infectives    Start     Dose/Rate Route Frequency Ordered Stop   12/01/14 2230  piperacillin-tazobactam (ZOSYN) IVPB 3.375 g     3.375 g 12.5 mL/hr over 240 Minutes Intravenous 3 times per day 12/01/14 2138     12/01/14 1315  fidaxomicin (DIFICID) tablet 200 mg     200 mg Oral 2 times daily 12/01/14 1304     11/30/14 1500  metroNIDAZOLE (FLAGYL) IVPB 500 mg  Status:  Discontinued     500 mg 100 mL/hr over 60 Minutes Intravenous Every 8 hours 11/30/14 0939 11/30/14 1400   11/30/14 1315  vancomycin (VANCOCIN) 50 mg/mL oral solution 500 mg  Status:  Discontinued     500 mg Oral 4 times per day 11/30/14 1312 11/30/14 1312   11/30/14 1230  fidaxomicin (DIFICID) tablet 200 mg  Status:  Discontinued     200 mg Oral 2 times daily 11/30/14 1227 12/01/14 1304   11/30/14 0630  metroNIDAZOLE (FLAGYL) IVPB 500 mg     500 mg 100 mL/hr over 60 Minutes Intravenous  Once 11/30/14 0626 11/30/14 0756      Objective: Filed Weights   11/30/14  0105  Weight: 63.192 kg (139 lb 5 oz)    Intake/Output Summary (Last 24 hours) at 12/03/14 1109 Last data filed at 12/03/14 0300  Gross per 24 hour  Intake    805 ml  Output    515 ml  Net    290 ml     Vitals Filed Vitals:   12/02/14 1714 12/02/14 2158 12/02/14 2200 12/03/14 0526  BP: 125/55 141/51  121/48  Pulse: 87 89  83  Temp: 98.9 F (37.2 C) 100.5 F (38.1 C)  99 F (37.2 C)  TempSrc: Oral Axillary  Oral  Resp: 16 20  20   Height:      Weight:      SpO2: 94% 87% 94% 96%    Exam:  General:  Pt is alert, not in acute distress  HEENT: No icterus, No thrush,  oral mucosa moist  Cardiovascular: regular rate and rhythm, S1/S2 No murmur  Respiratory: clear to auscultation bilaterally   Abdomen: Soft, +Bowel sounds, tender in right upper quadrant, non distended, no guarding  MSK: No LE edema, cyanosis or clubbing  Data Reviewed: Basic Metabolic Panel:  Recent Labs Lab 11/30/14 0116 11/30/14 1043 12/01/14 0522 12/03/14 0422  NA 140  --  136 135  K 4.1  --  4.6 3.6  CL 103  --  102 103  CO2 27  --  27 24  GLUCOSE 160*  --  148* 126*  BUN 16  --  7 9  CREATININE 0.72 0.60 0.67 0.68  CALCIUM 9.2  --  9.0 7.9*   Liver Function Tests:  Recent Labs Lab 11/30/14 0116 12/01/14 0522  AST 48* 19  ALT 59* 36  ALKPHOS 131* 95  BILITOT 0.3 0.4  PROT 7.2 6.6  ALBUMIN 3.5 2.8*    Recent Labs Lab 11/30/14 0116  LIPASE 45   No results for input(s): AMMONIA in the last 168 hours. CBC:  Recent Labs Lab 11/30/14 0116 11/30/14 1043 12/01/14 0522 12/02/14 0415 12/03/14 0422  WBC 14.6* 13.6* 16.5* 13.4* 20.7*  HGB 13.5 12.3 12.5 10.9* 10.6*  HCT 41.5 37.9 37.3 32.5* 31.7*  MCV 83.7 83.1 83.4 83.1 83.0  PLT 359 290 270 309 234   Cardiac Enzymes: No results for input(s): CKTOTAL, CKMB, CKMBINDEX, TROPONINI in the last 168 hours. BNP (last 3 results) No results for input(s): BNP in the last 8760 hours.  ProBNP (last 3 results) No results for input(s): PROBNP in the last 8760 hours.  CBG: No results for input(s): GLUCAP in the last 168 hours.  Recent Results (from the past 240 hour(s))  Clostridium Difficile by PCR     Status: Abnormal   Collection Time: 11/23/14  2:40 PM  Result Value Ref Range Status   Toxigenic C Difficile by pcr Detected (AA) Not Detected Final    Comment: This test is for use only with liquid or soft stools; performance characteristics of other clinical specimen types have not been established.   This assay was performed by Cepheid GeneXpert(R) PCR. The performance characteristics of this assay  have been determined by [employer]. Performance characteristics refer to the analytical performance of the test.   Stool Culture     Status: None   Collection Time: 11/23/14  2:50 PM  Result Value Ref Range Status   Organism ID, Bacteria No Salmonella,Shigella,Campylobacter,Yersinia,or  Final   Organism ID, Bacteria No E.coli [id number]:H7 isolated.  Final     Studies: Nm Hepatobiliary Including Gb  12/01/2014   CLINICAL DATA:  Right upper quadrant abdominal pain with elevated liver function studies. History of gastric outlet obstruction and pyloric ulcer. Initial encounter.  EXAM: NUCLEAR MEDICINE HEPATOBILIARY IMAGING  TECHNIQUE: Sequential images of the abdomen were obtained out to 60 minutes following intravenous administration of radiopharmaceutical.  RADIOPHARMACEUTICALS:  5.6 mCi Tc-45m  Choletec IV  COMPARISON:  Abdominal CT 11/25/2014.  FINDINGS: There is homogeneous hepatic activity with spontaneous opacification of the gallbladder, biliary system and small bowel. No evidence of bile leak or obstruction.  IMPRESSION: Normal examination.  The cystic and common bile ducts are patent.   Electronically Signed   By: Richardean Sale M.D.   On: 12/01/2014 11:28   Ct Abdomen Pelvis W Contrast  12/01/2014   CLINICAL DATA:  Nausea, fever and right upper quadrant abdominal pain for 6 days. 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  EXAM: CT ABDOMEN AND PELVIS WITH CONTRAST  TECHNIQUE: Multidetector CT imaging of the abdomen and pelvis was performed using the standard protocol following bolus administration of intravenous contrast.  CONTRAST:  121mL OMNIPAQUE IOHEXOL 300 MG/ML  SOLN  COMPARISON:  11/25/2014  FINDINGS: Lower chest: There is a new right-sided pleural effusion with overlying atelectasis. There is also a left basilar atelectasis. The heart is normal in size. No pericardial effusion. The distal esophagus is grossly normal.  Hepatobiliary: No focal hepatic lesions or intrahepatic  biliary dilatation. There are this subdiaphragmatic abscesses anterior to the liver. The upper abscess measures a maximum 5 cm in the lower abscess measures a maximum of 7 cm. No intrahepatic abscess. There is inflammatory phlegmon in the right upper quadrant near the patient's prior gastric bypass surgery. I do not see any definite leaking oral contrast but I assume is a perforation that is sealed off. There is fluid between the stomach and the liver.  Pancreas: No mass, inflammation or ductal dilatation.  Spleen: No focal lesions.  Normal size.  Adrenals/Urinary Tract: Stable scarring changes involving the upper pole region of left kidney. The adrenal glands and right kidney are normal.  Stomach/Bowel: There is fairly marked wall thickening involving the residual stomach suggesting gastritis or peptic ulcer disease. A perforated peptic ulcer is possible but the jejunal loop of small bowel at the anastomosis appears normal. No findings for small bowel obstruction. No free air. The colon is unremarkable except for moderate stool.  Vascular/Lymphatic: Stable aortic calcifications. The branch vessels are patent. The major venous structures are patent. Probable inflammatory/hyperplastic right upper quadrant lymph nodes. No retroperitoneal adenopathy.  Other: The uterus and ovaries are normal. The bladder is normal. No pelvic mass. There is a small amount of free pelvic fluid. No inguinal mass or adenopathy.  Musculoskeletal: No significant bony findings.  IMPRESSION: 1. Persistent inflammatory phlegmon involving the space between the liver and the stomach. Interval development of perihepatic abscesses as described above. This could be due to anastomotic breakdown along the hepatic margin of the stomach which has sealed off leak or perforated peptic ulcer. The stomach wall is markedly thickened. No obvious leaking oral contrast. 2. New right pleural effusion and overlying atelectasis.   Electronically Signed   By: Marijo Sanes M.D.   On: 12/01/2014 19:28   Dg Abd 2 Views  12/01/2014   CLINICAL DATA:  Right upper quadrant pain for 1 week.  EXAM: ABDOMEN - 2 VIEW  COMPARISON:  November 30, 2014  FINDINGS: The bowel gas pattern is normal. Moderate bowel content is identified in the colon. There is no evidence of free air.  Surgical sutures identified in the left upper and mid abdomen. No radio-opaque calculi or other significant radiographic abnormality is seen. There is mild atelectasis of left lung base.  IMPRESSION: No bowel obstruction or free air. Moderate bowel content identified throughout colon.   Electronically Signed   By: Abelardo Diesel M.D.   On: 12/01/2014 12:27   [place] Image Guided Drainage By Percutaneous Catheter  12/02/2014   CLINICAL DATA:  59 year old female with a history of prior gastric bypass procedure with subsequent postoperative fluid collection in the upper abdomen. She has been referred for drainage.  EXAM: ULTRASOUND GUIDED DRAINAGE OF RIGHT UPPER QUADRANT FLUID COLLECTION  MEDICATIONS: 2.0 mg IV Versed; 200 mcg IV Fentanyl  Total Moderate Sedation Time: 20  PROCEDURE: The procedure, risks, benefits, and alternatives were explained to the patient. Questions regarding the procedure were encouraged and answered. The patient understands and consents to the procedure.  Patient is position supine position on a stretcher. Ultrasound survey of the upper abdomen was performed with images stored and sent to PACs.  The right upper quadrant was prepped with chlorhexidine in a sterile fashion, and a sterile drape was applied covering the operative field. A sterile gown and sterile gloves were used for the procedure. Local anesthesia was provided with 1% Lidocaine.  Using ultrasound guidance the skin and subcutaneous tissues were generously infiltrated with 1% lidocaine to the level of the abdominal wall.  Using ultrasound imaging a 10 French drain was advanced into the fluid collection using a trocar technique.   Once the drain was in place approximately 90 cc of bilious fluid was aspirated. A sample sent to the lab.  Retention suture was placed in the drain was placed to gravity bag.  Patient tolerated the procedure well and remained hemodynamically stable throughout.  No complications encountered and no significant blood loss encounter.  COMPLICATIONS: None.  FINDINGS: Ultrasound images demonstrate fluid collection overlying the liver.  90 cc of bilious fluid was drained from the fluid collection.  IMPRESSION: Status post ultrasound-guided drain placement into right upper quadrant fluid collection with 90 cc of bilious fluid aspirated.  Signed,  Dulcy Fanny. Earleen Newport, DO  [facility]  [facility]   Electronically Signed   By: Corrie Mckusick D.O.   On: 12/02/2014 18:08    Scheduled Meds:  Scheduled Meds: . ALPRAZolam  0.25 mg Oral Daily  . atorvastatin  20 mg Oral q1800  . enoxaparin (LOVENOX) injection  40 mg Subcutaneous Daily  . fidaxomicin  200 mg Oral BID  . FLUoxetine  40 mg Oral BID  . lip balm   Topical BID  . multivitamins with iron  1 tablet Oral Daily  . pantoprazole (PROTONIX) IV  40 mg Intravenous Q12H  . piperacillin-tazobactam (ZOSYN)  IV  3.375 g Intravenous 3 times per day  . saccharomyces boulardii  250 mg Oral BID  . vitamin C  500 mg Oral BID   Continuous Infusions: . sodium chloride 100 mL/hr at 12/03/14 0300    Time spent on care of this patient: 35 min   [place], MD 12/03/2014, 11:09 AM  LOS: 3 days   [facility] Office  [phone] Pager - Text Page per [email or web address] If 7PM-7AM, please contact night-coverage [email or web address]

## 2014-12-03 NOTE — Progress Notes (Signed)
Referring Physician(s): Dr. Ileene Musa  Subjective: S/p perc drain RUQ Pt feeling ok Sore at drain site No N/V For upper GI study today to rule out gastric leak  Allergies: Cefotetan; Nsaids; Codeine sulfate; and Flagyl  Medications:  Current facility-administered medications:  .  0.9 %  sodium chloride infusion, , Intravenous, Continuous, Debbe Odea, MD, Last Rate: 100 mL/hr at 12/03/14 0300 .  acetaminophen (TYLENOL) suppository 650 mg, 650 mg, Rectal, Q6H PRN, Michael Boston, MD .  acetaminophen (TYLENOL) tablet 650 mg, 650 mg, Oral, Q6H PRN, Debbe Odea, MD, 650 mg at 12/01/14 2157 .  ALPRAZolam (XANAX) tablet 0.25 mg, 0.25 mg, Oral, Daily, Debbe Odea, MD, 0.25 mg at 12/02/14 1125 .  alum & mag hydroxide-simeth (MAALOX/MYLANTA) 200-200-20 MG/5ML suspension 30 mL, 30 mL, Oral, Q6H PRN, Michael Boston, MD .  atorvastatin (LIPITOR) tablet 20 mg, 20 mg, Oral, q1800, Debbe Odea, MD, 20 mg at 12/02/14 1812 .  bisacodyl (DULCOLAX) suppository 10 mg, 10 mg, Rectal, Q12H PRN, Michael Boston, MD .  diphenhydrAMINE (BENADRYL) injection 12.5-25 mg, 12.5-25 mg, Intravenous, Q6H PRN, Michael Boston, MD .  enoxaparin (LOVENOX) injection 40 mg, 40 mg, Subcutaneous, Daily, Monia Sabal, PA-C, Stopped at 12/02/14 [phone]  fidaxomicin (DIFICID) tablet 200 mg, 200 mg, Oral, BID, Debbe Odea, MD, 200 mg at 12/02/14 1423 .  FLUoxetine (PROZAC) capsule 40 mg, 40 mg, Oral, BID, Michael Boston, MD .  HYDROmorphone (DILAUDID) injection 0.5-2 mg, 0.5-2 mg, Intravenous, Q2H PRN, Michael Boston, MD .  lactated ringers bolus 1,000 mL, 1,000 mL, Intravenous, Q8H PRN, Michael Boston, MD .  lactated ringers bolus 1,000 mL, 1,000 mL, Intravenous, Q8H PRN, Michael Boston, MD .  lip balm (BLISTEX) ointment, , Topical, BID, Michael Boston, MD .  LORazepam (ATIVAN) injection 0.5-1 mg, 0.5-1 mg, Intravenous, Q8H PRN, Michael Boston, MD .  magic mouthwash, 15 mL, Oral, QID PRN, Michael Boston, MD .  menthol-cetylpyridinium  (CEPACOL) lozenge 3 mg, 1 lozenge, Oral, PRN, Michael Boston, MD .  metoCLOPramide (REGLAN) injection 5-10 mg, 5-10 mg, Intravenous, Q6H PRN, Michael Boston, MD .  metoprolol (LOPRESSOR) injection 5 mg, 5 mg, Intravenous, Q6H PRN, Michael Boston, MD .  multivitamins with iron tablet 1 tablet, 1 tablet, Oral, Daily, Michael Boston, MD .  ondansetron [facility]) injection 4 mg, 4 mg, Intravenous, Q6H PRN **OR** ondansetron (ZOFRAN) 8 mg in sodium chloride 0.9 % 50 mL IVPB, 8 mg, Intravenous, Q6H PRN, Michael Boston, MD .  ondansetron (ZOFRAN) injection 4 mg, 4 mg, Intravenous, Q6H PRN, Debbe Odea, MD .  ondansetron (ZOFRAN-ODT) disintegrating tablet 4-8 mg, 4-8 mg, Oral, Q6H PRN, Michael Boston, MD .  oxyCODONE (Oxy IR/ROXICODONE) immediate release tablet 5-10 mg, 5-10 mg, Oral, Q4H PRN, Michael Boston, MD .  pantoprazole (PROTONIX) injection 40 mg, 40 mg, Intravenous, Q12H, Michael Boston, MD, 40 mg at 12/02/14 2129 .  phenol (CHLORASEPTIC) mouth spray 2 spray, 2 spray, Mouth/Throat, PRN, Michael Boston, MD .  piperacillin-tazobactam (ZOSYN) IVPB 3.375 g, 3.375 g, Intravenous, 3 times per day, Wendee Beavers, RPH, 3.375 g at 12/03/14 0524 .  promethazine (PHENERGAN) injection 6.25-12.5 mg, 6.25-12.5 mg, Intravenous, Q4H PRN, Michael Boston, MD .  saccharomyces boulardii (FLORASTOR) capsule 250 mg, 250 mg, Oral, BID, Debbe Odea, MD, 250 mg at 12/02/14 2129 .  technetium TC 75M mebrofenin (CHOLETEC) injection 6 milli Curie, 6 milli Curie, Intravenous, Once PRN, Medication Radiologist, MD, [place] at 12/01/14 1007 .  vitamin C (ASCORBIC ACID) tablet 500 mg, 500 mg, Oral, BID, Michael Boston,  MD .  zolpidem (AMBIEN) tablet 5 mg, 5 mg, Oral, QHS PRN, Michael Boston, MD    Vital Signs: BP 121/48 mmHg  Pulse 83  Temp(Src) 99 F (37.2 C) (Oral)  Resp 20  Ht 4\' 11"  (1.499 m)  Wt 139 lb 5 oz (63.192 kg)  BMI 28.12 kg/m2  SpO2 96%  Physical Exam  Constitutional: She is oriented to person, place, and time.    Cardiovascular: Normal rate, regular rhythm and normal heart sounds.   Pulmonary/Chest: Effort normal. No respiratory distress.  Abdominal: Soft. There is tenderness.  RUQ drain site clean, no leakage. Mildly tender Output bilious  Neurological: She is alert and oriented to person, place, and time.    Imaging:  [place] Image Guided Drainage By Percutaneous Catheter  12/02/2014   CLINICAL DATA:  59 year old female with a history of prior gastric bypass procedure with subsequent postoperative fluid collection in the upper abdomen. She has been referred for drainage.  EXAM: ULTRASOUND GUIDED DRAINAGE OF RIGHT UPPER QUADRANT FLUID COLLECTION  MEDICATIONS: 2.0 mg IV Versed; 200 mcg IV Fentanyl  Total Moderate Sedation Time: 20  PROCEDURE: The procedure, risks, benefits, and alternatives were explained to the patient. Questions regarding the procedure were encouraged and answered. The patient understands and consents to the procedure.  Patient is position supine position on a stretcher. Ultrasound survey of the upper abdomen was performed with images stored and sent to PACs.  The right upper quadrant was prepped with chlorhexidine in a sterile fashion, and a sterile drape was applied covering the operative field. A sterile gown and sterile gloves were used for the procedure. Local anesthesia was provided with 1% Lidocaine.  Using ultrasound guidance the skin and subcutaneous tissues were generously infiltrated with 1% lidocaine to the level of the abdominal wall.  Using ultrasound imaging a 10 French drain was advanced into the fluid collection using a trocar technique.  Once the drain was in place approximately 90 cc of bilious fluid was aspirated. A sample sent to the lab.  Retention suture was placed in the drain was placed to gravity bag.  Patient tolerated the procedure well and remained hemodynamically stable throughout.  No complications encountered and no significant blood loss encounter.  COMPLICATIONS:  None.  FINDINGS: Ultrasound images demonstrate fluid collection overlying the liver.  90 cc of bilious fluid was drained from the fluid collection.  IMPRESSION: Status post ultrasound-guided drain placement into right upper quadrant fluid collection with 90 cc of bilious fluid aspirated.  Signed,  Dulcy Fanny. Earleen Newport, DO  [facility]  [facility]   Electronically Signed   By: Corrie Mckusick D.O.   On: 12/02/2014 18:08    Labs:  CBC:  Recent Labs  11/30/14 1043 12/01/14 0522 12/02/14 0415 12/03/14 0422  WBC 13.6* 16.5* 13.4* 20.7*  HGB 12.3 12.5 10.9* 10.6*  HCT 37.9 37.3 32.5* 31.7*  PLT 290 270 309 234    COAGS:  Recent Labs  12/02/14 0759  INR 1.20  APTT 28    BMP:  Recent Labs  11/25/14 0948 11/30/14 0116 11/30/14 1043 12/01/14 0522 12/03/14 0422  NA 139 140  --  136 135  K 4.3 4.1  --  4.6 3.6  CL 105 103  --  102 103  CO2 25 27  --  27 24  GLUCOSE 102* 160*  --  148* 126*  BUN 7 16  --  7 9  CALCIUM 9.1 9.2  --  9.0 7.9*  CREATININE 0.55  0.72 0.60 0.67 0.68  GFRNONAA >60 >60 >60 >60 >60  GFRAA >60 >60 >60 >60 >60    LIVER FUNCTION TESTS:  Recent Labs  09/13/14 1623 11/25/14 0948 11/30/14 0116 12/01/14 0522  BILITOT 0.3 0.4 0.3 0.4  AST 20 39 48* 19  ALT 17 48 59* 36  ALKPHOS 100 114 131* 95  PROT 7.2 6.9 7.2 6.6  ALBUMIN 3.5 3.4* 3.5 2.8*    Assessment and Plan: RUQ abscess/biloma S/p perc drain with good output Cont drain care For Upper GI study today  Signed: Ascencion Dike 12/03/2014, 9:21 AM   I spent a total of 15 Minutes at the the patient's bedside AND on the patient's hospital floor or unit, greater than 50% of which was counseling/coordinating care for RUQ abscess drain

## 2014-12-03 NOTE — Telephone Encounter (Signed)
She is in the hospital now so we can cancel the consultation for now

## 2014-12-04 ENCOUNTER — Inpatient Hospital Stay (HOSPITAL_COMMUNITY): Payer: BLUE CROSS/BLUE SHIELD

## 2014-12-04 LAB — CBC
HEMATOCRIT: 30.6 % — AB (ref 36.0–46.0)
HEMOGLOBIN: 10 g/dL — AB (ref 12.0–15.0)
MCH: 27.4 pg (ref 26.0–34.0)
MCHC: 32.7 g/dL (ref 30.0–36.0)
MCV: 83.8 fL (ref 78.0–100.0)
Platelets: 239 10*3/uL (ref 150–400)
RBC: 3.65 MIL/uL — ABNORMAL LOW (ref 3.87–5.11)
RDW: 15.7 % — AB (ref 11.5–15.5)
WBC: 15.4 10*3/uL — ABNORMAL HIGH (ref 4.0–10.5)

## 2014-12-04 MED ORDER — CETYLPYRIDINIUM CHLORIDE 0.05 % MT LIQD
7.0000 mL | Freq: Two times a day (BID) | OROMUCOSAL | Status: DC
  Administered 2014-12-05 – 2014-12-10 (×10): 7 mL via OROMUCOSAL

## 2014-12-04 NOTE — Progress Notes (Addendum)
Shelly Hood  03/13/1955 [id number]  Patient Care Team: Venia Carbon, MD as PCP - General Ladene Artist, MD as Consulting Physician (Gastroenterology) Michael Boston, MD as Consulting Physician (General Surgery)  -drain output with normal bilirubin & amylase = not c/w leak -UGI not c/w leak  -try PO liquids & adv to pureed at least  -postgastrectomy diet training reinforcement  -keep drain & consider drain study in 5 days from drain placement (= 9/28 Wed) +/- Ct scan to r/o fistula to GJ/stomach  -high dose PPIs for presumed gastritis  -check gastrin level w recurrent gastrits after V&Antrectomy  -consider EGD to eval gastritis, r/o stricture if no fistula confirmed on drain study Wed  -no good evid of colitis on CT scan = ?CDiff colonization & not colitis ?? - defer to ID -chronic diarrhea for years = reconsdier etiology as not just C Diff  -cont Abx  -f/u on drain fluid - Cultures still pending - ?has Micro cultured it ?  -I will be able to see pt Monday  [name], M.D., F.A.C.S. Gastrointestinal and Minimally Invasive Surgery [facility] [address], [place], [place] [address] [phone] Main / Paging    Patient Active Problem List   Diagnosis Date Noted  . Elevated WBC count   . Fever   . Peptic ulcer disease 12/02/2014  . RUQ pain   . Abscess, abdomen   . Clostridium difficile colitis 11/30/2014  . Leukocytosis 11/30/2014  . Elevated LFTs   . 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 Obesity 04/15/2011  . GERD 02/22/2009  . OSTEOPENIA 10/14/2006  . Hyperlipemia 07/30/2006  . Anxiety disorder 07/30/2006  . ALLERGIC RHINITIS 07/30/2006    Past Medical History  Diagnosis Date  . Panic attacks   . High cholesterol   . Obesity   . Gastric outlet obstruction   . Pyloric ulcer   . Depression   . GERD (gastroesophageal reflux disease)   . Pyloric stenosis   .  Clostridium difficile infection   . Clostridium difficile colitis 11/30/2014  . Peptic ulcer disease 12/02/2014  . Thrombocytopenia 06/26/2014    Past Surgical History  Procedure Laterality Date  . Rhinoplasty      X3  . Colonscopy  AGE 59  . Vagotomy N/A 06/24/2014    Procedure: ROBOTIC TRUNCAL VAGOTOMY ;  Surgeon: Michael Boston, MD;  Location: [facility];  Service: General;  Laterality: N/A;  This is [name]'s preference  . Antrectomy N/A 06/24/2014    Procedure: ROBOTIC DISTAL GASTRECTOMY;  Surgeon: Michael Boston, MD;  Location: [facility];  Service: General;  Laterality: N/A;  . Laparoscopic roux-en-y gastric bypass with hiatal hernia repair N/A 06/24/2014    Procedure: ROBOTIC ROUX-EN-Y CONSTRUCTION WITH HIATAL HERNIA REPAIR;  Surgeon: Michael Boston, MD;  Location: [facility];  Service: General;  Laterality: N/A;  . Colonoscopy N/A 10/19/2014    Procedure: COLONOSCOPY;  Surgeon: Gatha Mayer, MD;  Location: [place];  Service: Endoscopy;  Laterality: N/A;  with FMT  . Fecal transplant N/A 10/19/2014    Procedure: FECAL TRANSPLANT;  Surgeon: Gatha Mayer, MD;  Location: [place];  Service: Endoscopy;  Laterality: N/A;    Social History   Social History  . Marital Status: Married    Spouse Name: N/A  . Number of Children: 2  . Years of Education: N/A   Occupational History  . 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 Smoking status: Former Smoker -- 0.25 packs/day for 30 years    Types: Cigarettes    Quit date: 06/18/2014  . Smokeless tobacco: Never Used  . Alcohol Use: No  . Drug Use: No  . Sexual Activity:    Partners: Male    Birth Control/ Protection: None   Other Topics Concern  . Not on file   Social History Narrative    Family History  Problem Relation Age of Onset  . Diabetes Mother   . Diabetes Maternal Grandmother   . Heart disease Father   . Colon cancer Neg Hx     Current Facility-Administered Medications   Medication Dose Route Frequency Provider Last Rate Last Dose  . 0.9 %  sodium chloride infusion   Intravenous Continuous Michael Boston, MD 100 mL/hr at 12/04/14 0123    . acetaminophen (TYLENOL) suppository 650 mg  650 mg Rectal Q6H PRN Michael Boston, MD      . acetaminophen (TYLENOL) tablet 650 mg  650 mg Oral Q6H PRN Debbe Odea, MD   650 mg at 12/03/14 1352  . ALPRAZolam Duanne Moron) tablet 0.25 mg  0.25 mg Oral Daily Debbe Odea, MD   0.25 mg at 12/03/14 0940  . alum & mag hydroxide-simeth (MAALOX/MYLANTA) 200-200-20 MG/5ML suspension 30 mL  30 mL Oral Q6H PRN Michael Boston, MD      . atorvastatin (LIPITOR) tablet 20 mg  20 mg Oral q1800 Debbe Odea, MD   20 mg at 12/03/14 1747  . bisacodyl (DULCOLAX) suppository 10 mg  10 mg Rectal Q12H PRN Michael Boston, MD      . diphenhydrAMINE (BENADRYL) injection 12.5-25 mg  12.5-25 mg Intravenous Q6H PRN Michael Boston, MD      . enoxaparin (LOVENOX) injection 40 mg  40 mg Subcutaneous Daily Monia Sabal, PA-C   40 mg at 12/03/14 5329  . famotidine (PEPCID) tablet 20 mg  20 mg Oral BID Ralene Ok, MD   20 mg at 12/03/14 2133  . fidaxomicin (DIFICID) tablet 200 mg  200 mg Oral BID Debbe Odea, MD   200 mg at 12/03/14 2133  . FLUoxetine (PROZAC) capsule 40 mg  40 mg Oral BID Michael Boston, MD   40 mg at 12/03/14 2134  . iohexol (OMNIPAQUE) 300 MG/ML solution 150 mL  150 mL Oral Once PRN Debbe Odea, MD   75 mL at 12/03/14 1246  . lactated ringers bolus 1,000 mL  1,000 mL Intravenous Q8H PRN Michael Boston, MD      . lactated ringers bolus 1,000 mL  1,000 mL Intravenous Q8H PRN Michael Boston, MD      . lip balm (BLISTEX) ointment   Topical BID Michael Boston, MD      . LORazepam (ATIVAN) injection 0.5-1 mg  0.5-1 mg Intravenous Q8H PRN Michael Boston, MD      . magic mouthwash  15 mL Oral QID PRN Michael Boston, MD      . menthol-cetylpyridinium (CEPACOL) lozenge 3 mg  1 lozenge Oral PRN Michael Boston, MD      . metoCLOPramide (REGLAN) injection 5-10 mg  5-10 mg  Intravenous Q6H PRN Michael Boston, MD      . metoprolol (LOPRESSOR) injection 5 mg  5 mg Intravenous Q6H PRN Michael Boston, MD      . morphine 2 MG/ML injection 1-4 mg  1-4 mg Intravenous Q3H PRN Debbe Odea, MD   4 mg at 12/03/14 2145  . multivitamins with iron tablet 1 tablet  1  tablet Oral Daily Michael Boston, MD   1 tablet at 12/03/14 [phone]  . ondansetron (ZOFRAN) injection 4 mg  4 mg Intravenous Q6H PRN Michael Boston, MD       Or  . ondansetron (ZOFRAN) 8 mg in sodium chloride 0.9 % 50 mL IVPB  8 mg Intravenous Q6H PRN Michael Boston, MD      . ondansetron [facility]) injection 4 mg  4 mg Intravenous Q6H PRN Debbe Odea, MD      . ondansetron (ZOFRAN-ODT) disintegrating tablet 4-8 mg  4-8 mg Oral Q6H PRN Michael Boston, MD      . oxyCODONE (Oxy IR/ROXICODONE) immediate release tablet 5-10 mg  5-10 mg Oral Q4H PRN Michael Boston, MD      . phenol [facility]) mouth spray 2 spray  2 spray Mouth/Throat PRN Michael Boston, MD      . piperacillin-tazobactam (ZOSYN) IVPB 3.375 g  3.375 g Intravenous 3 times per day Wendee Beavers, RPH   3.375 g at 12/04/14 1191  . promethazine (PHENERGAN) injection 6.25-12.5 mg  6.25-12.5 mg Intravenous Q4H PRN Michael Boston, MD      . saccharomyces boulardii (FLORASTOR) capsule 250 mg  250 mg Oral BID Debbe Odea, MD   250 mg at 12/03/14 2134  . technetium TC 90M mebrofenin (CHOLETEC) injection 6 milli Curie  6 milli Curie Intravenous Once PRN Medication Radiologist, MD   [place] at 12/01/14 1007  . vitamin C (ASCORBIC ACID) tablet 500 mg  500 mg Oral BID Michael Boston, MD   500 mg at 12/03/14 2134  . zolpidem (AMBIEN) tablet 5 mg  5 mg Oral QHS PRN Michael Boston, MD         Allergies  Allergen Reactions  . Cefotetan Other (See Comments)    Thrombocytopenia, plat ct 18K  . Nsaids     DUODENAL ULCER  . Codeine Sulfate     REACTION: nausea only tolerates hydrocodone  . Flagyl [Metronidazole] Other (See Comments)    Weak, bad taste     BP 115/86 mmHg  Pulse 77   Temp(Src) 98.6 F (37 C) (Oral)  Resp 20  Ht 4\' 11"  (1.499 m)  Wt 63.192 kg (139 lb 5 oz)  BMI 28.12 kg/m2  SpO2 95%  Dg Abd 1 View  11/30/2014   CLINICAL DATA:  RIGHT lower quadrant pain, recent history of C difficile colitis.  EXAM: ABDOMEN - 1 VIEW  COMPARISON:  CT abdomen and pelvis November 25, 2014  FINDINGS: The bowel gas pattern is normal. No radio-opaque calculi or other significant radiographic abnormality are seen. No free air. Soft tissue planes and included osseous structures are nonsuspicious.  IMPRESSION: Negative.   Electronically Signed   By: Elon Alas M.D.   On: 11/30/2014 05:25   Nm Hepatobiliary Including Gb  12/01/2014   CLINICAL DATA:  Right upper quadrant abdominal pain with elevated liver function studies. History of gastric outlet obstruction and pyloric ulcer. Initial encounter.  EXAM: NUCLEAR MEDICINE HEPATOBILIARY IMAGING  TECHNIQUE: Sequential images of the abdomen were obtained out to 60 minutes following intravenous administration of radiopharmaceutical.  RADIOPHARMACEUTICALS:  5.6 mCi Tc-61m  Choletec IV  COMPARISON:  Abdominal CT 11/25/2014.  FINDINGS: There is homogeneous hepatic activity with spontaneous opacification of the gallbladder, biliary system and small bowel. No evidence of bile leak or obstruction.  IMPRESSION: Normal examination.  The cystic and common bile ducts are patent.   Electronically Signed   By: Richardean Sale M.D.   On: 12/01/2014 11:28  US Abdomen Complete  11/25/2014   CLINICAL DATA:  Right lower quadrant pain 2 days.  EXAM: ULTRASOUND ABDOMEN COMPLETE  COMPARISON:  None.  FINDINGS: Gallbladder: No gallstones or wall thickening visualized. No sonographic Murphy sign noted.  Common bile duct: Diameter: 3.0 mm.  Liver: 1.9 cm echogenic focus within the central liver likely a small hemangioma or focal fatty change.  IVC: No abnormality visualized.  Pancreas: Visualized portion unremarkable.  Spleen: Size and appearance within normal  limits.  Right Kidney: Length: 10.8 cm. Echogenicity within normal limits. No mass or hydronephrosis visualized.  Left Kidney: Length: Lower limits of normal in size measuring 8.8 cm. Echogenicity within normal limits. No mass or hydronephrosis visualized.  Abdominal aorta: No aneurysm visualized.  Other findings: None.  IMPRESSION: No acute hepatobiliary disease.  Focal echogenic center liver lesion measuring 1.9 cm likely a hemangioma versus focal fatty change.   Electronically Signed   By: Marin Olp M.D.   On: 11/25/2014 11:49   Ct Abdomen Pelvis W Contrast  12/01/2014   CLINICAL DATA:  Nausea, fever and right upper quadrant abdominal pain for 6 days. 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  EXAM: CT ABDOMEN AND PELVIS WITH CONTRAST  TECHNIQUE: Multidetector CT imaging of the abdomen and pelvis was performed using the standard protocol following bolus administration of intravenous contrast.  CONTRAST:  129mL OMNIPAQUE IOHEXOL 300 MG/ML  SOLN  COMPARISON:  11/25/2014  FINDINGS: Lower chest: There is a new right-sided pleural effusion with overlying atelectasis. There is also a left basilar atelectasis. The heart is normal in size. No pericardial effusion. The distal esophagus is grossly normal.  Hepatobiliary: No focal hepatic lesions or intrahepatic biliary dilatation. There are this subdiaphragmatic abscesses anterior to the liver. The upper abscess measures a maximum 5 cm in the lower abscess measures a maximum of 7 cm. No intrahepatic abscess. There is inflammatory phlegmon in the right upper quadrant near the patient's prior gastric bypass surgery. I do not see any definite leaking oral contrast but I assume is a perforation that is sealed off. There is fluid between the stomach and the liver.  Pancreas: No mass, inflammation or ductal dilatation.  Spleen: No focal lesions.  Normal size.  Adrenals/Urinary Tract: Stable scarring changes involving the upper pole region of left kidney. The  adrenal glands and right kidney are normal.  Stomach/Bowel: There is fairly marked wall thickening involving the residual stomach suggesting gastritis or peptic ulcer disease. A perforated peptic ulcer is possible but the jejunal loop of small bowel at the anastomosis appears normal. No findings for small bowel obstruction. No free air. The colon is unremarkable except for moderate stool.  Vascular/Lymphatic: Stable aortic calcifications. The branch vessels are patent. The major venous structures are patent. Probable inflammatory/hyperplastic right upper quadrant lymph nodes. No retroperitoneal adenopathy.  Other: The uterus and ovaries are normal. The bladder is normal. No pelvic mass. There is a small amount of free pelvic fluid. No inguinal mass or adenopathy.  Musculoskeletal: No significant bony findings.  IMPRESSION: 1. Persistent inflammatory phlegmon involving the space between the liver and the stomach. Interval development of perihepatic abscesses as described above. This could be due to anastomotic breakdown along the hepatic margin of the stomach which has sealed off leak or perforated peptic ulcer. The stomach wall is markedly thickened. No obvious leaking oral contrast. 2. New right pleural effusion and overlying atelectasis.   Electronically Signed   By: Marijo Sanes M.D.   On: 12/01/2014 19:28  Ct Abdomen Pelvis W Contrast  11/25/2014   CLINICAL DATA:  Right upper quadrant pain for 3 4 days, Clostridium difficile infection for 5 weeks  EXAM: CT ABDOMEN AND PELVIS WITH CONTRAST  TECHNIQUE: Multidetector CT imaging of the abdomen and pelvis was performed using the standard protocol following bolus administration of intravenous contrast.  CONTRAST:  141mL OMNIPAQUE IOHEXOL 300 MG/ML  SOLN  COMPARISON:  None.  FINDINGS: Lower chest:  No significant opacities at the lung bases  Hepatobiliary: Negative  Pancreas: Negative  Spleen: Negative  Adrenals/Urinary Tract: Small nonobstructing calculi right  kidney. Scarring upper pole left kidney.  Stomach/Bowel: Status post gastric bypass. Nonobstructive gas pattern. No significant smaller large bowel wall thickening. Gastrojejunostomy noted. Moderate inflammation in the vicinity of the hepatic flexure of the colon, primarily located in the abdominal fat immediately anterior and primarily cranial to the flexure. No evidence of pneumatosis or pneumoperitoneum.  Vascular/Lymphatic: Mild aortic calcification.  Reproductive: No significant abnormalities  Other: No ascites  Musculoskeletal: negative  IMPRESSION: Acute inflammatory change in the fat medially anterior to the hepatic flexure of the colon. Differential diagnostic possibilities include postoperative fat necrosis, epiploic appendagitis, focal colitis, or mass, which is less likely.   Electronically Signed   By: Skipper Cliche M.D.   On: 11/25/2014 14:29   Dg Abd 2 Views  12/01/2014   CLINICAL DATA:  Right upper quadrant pain for 1 week.  EXAM: ABDOMEN - 2 VIEW  COMPARISON:  November 30, 2014  FINDINGS: The bowel gas pattern is normal. Moderate bowel content is identified in the colon. There is no evidence of free air. Surgical sutures identified in the left upper and mid abdomen. No radio-opaque calculi or other significant radiographic abnormality is seen. There is mild atelectasis of left lung base.  IMPRESSION: No bowel obstruction or free air. Moderate bowel content identified throughout colon.   Electronically Signed   By: Abelardo Diesel M.D.   On: 12/01/2014 12:27   Dg Duanne Limerick W/water Sol Cm  12/03/2014   CLINICAL DATA:  Abscesses around the stomach and liver. History of peptic ulcer disease and distal antrectomy with gastrojejunostomy. Concern for anastomotic leak.  EXAM: WATER SOLUBLE UPPER GI SERIES  TECHNIQUE: Single-column upper GI series was performed using water soluble contrast.  CONTRAST:  65mL OMNIPAQUE IOHEXOL 300 MG/ML  SOLN  COMPARISON:  CT 12/01/2014.  FLUOROSCOPY TIME:  Radiation Exposure  Index (as provided by the fluoroscopic device):  If the device does not provide the exposure index:  Fluoroscopy Time (in minutes and seconds):  1 minutes 24 seconds  Number of Acquired Images:  18  FINDINGS: No esophageal stricture. No evidence of leak from the stomach. There is mild fold thickening in the distal stomach suggesting gastritis. There is prompt emptying of the stomach into the jejunum. No anastomotic leak. No evidence of small bowel obstruction.  IMPRESSION: No evidence of anastomotic leak or obstruction. Mild fold thickening in the distal stomach likely reflects gastritis.   Electronically Signed   By: Rolm Baptise M.D.   On: 12/03/2014 12:53   [place] Image Guided Drainage By Percutaneous Catheter  12/02/2014   CLINICAL DATA:  59 year old female with a history of prior gastric bypass procedure with subsequent postoperative fluid collection in the upper abdomen. She has been referred for drainage.  EXAM: ULTRASOUND GUIDED DRAINAGE OF RIGHT UPPER QUADRANT FLUID COLLECTION  MEDICATIONS: 2.0 mg IV Versed; 200 mcg IV Fentanyl  Total Moderate Sedation Time: 20  PROCEDURE: The procedure, risks, benefits, and alternatives were  explained to the patient. Questions regarding the procedure were encouraged and answered. The patient understands and consents to the procedure.  Patient is position supine position on a stretcher. Ultrasound survey of the upper abdomen was performed with images stored and sent to PACs.  The right upper quadrant was prepped with chlorhexidine in a sterile fashion, and a sterile drape was applied covering the operative field. A sterile gown and sterile gloves were used for the procedure. Local anesthesia was provided with 1% Lidocaine.  Using ultrasound guidance the skin and subcutaneous tissues were generously infiltrated with 1% lidocaine to the level of the abdominal wall.  Using ultrasound imaging a 10 French drain was advanced into the fluid collection using a trocar technique.   Once the drain was in place approximately 90 cc of bilious fluid was aspirated. A sample sent to the lab.  Retention suture was placed in the drain was placed to gravity bag.  Patient tolerated the procedure well and remained hemodynamically stable throughout.  No complications encountered and no significant blood loss encounter.  COMPLICATIONS: None.  FINDINGS: Ultrasound images demonstrate fluid collection overlying the liver.  90 cc of bilious fluid was drained from the fluid collection.  IMPRESSION: Status post ultrasound-guided drain placement into right upper quadrant fluid collection with 90 cc of bilious fluid aspirated.  Signed,  Dulcy Fanny. Earleen Newport, DO  [facility]  [facility]   Electronically Signed   By: Corrie Mckusick D.O.   On: 12/02/2014 18:08    Note: This dictation was prepared with Dragon/digital dictation along with [employer]. Any transcriptional errors that result from this process are unintentional.

## 2014-12-04 NOTE — Progress Notes (Signed)
Subjective: Complains of pain at drain site. Swallow study showed no leak  Objective: Vital signs in last 24 hours: Temp:  [97.7 F (36.5 C)-101.8 F (38.8 C)] 98.6 F (37 C) (09/25 0636) Pulse Rate:  [69-82] 77 (09/25 0636) Resp:  [18-20] 20 (09/25 0636) BP: (114-131)/(46-86) 115/86 mmHg (09/25 0636) SpO2:  [90 %-95 %] 95 % (09/25 0636) Last BM Date: 12/02/14  Intake/Output from previous day: 09/24 0701 - 09/25 0700 In: 1640 [P.O.:430; I.V.:800; IV Piggyback:400] Out: 1650 [Urine:1500; Drains:150] Intake/Output this shift:    Resp: clear to auscultation bilaterally Cardio: regular rate and rhythm GI: soft and nontender except where drain enters. drain output still light bilious color  Lab Results:   Recent Labs  12/03/14 0422 12/04/14 0402  WBC 20.7* 15.4*  HGB 10.6* 10.0*  HCT 31.7* 30.6*  PLT 234 239   BMET  Recent Labs  12/03/14 0422  NA 135  K 3.6  CL 103  CO2 24  GLUCOSE 126*  BUN 9  CREATININE 0.68  CALCIUM 7.9*   PT/INR  Recent Labs  12/02/14 0759  LABPROT 15.3*  INR 1.20   ABG No results for input(s): PHART, HCO3 in the last 72 hours.  Invalid input(s): PCO2, PO2  Studies/Results: Dg Ugi W/water Sol Cm  12/03/2014   CLINICAL DATA:  Abscesses around the stomach and liver. History of peptic ulcer disease and distal antrectomy with gastrojejunostomy. Concern for anastomotic leak.  EXAM: WATER SOLUBLE UPPER GI SERIES  TECHNIQUE: Single-column upper GI series was performed using water soluble contrast.  CONTRAST:  70mL OMNIPAQUE IOHEXOL 300 MG/ML  SOLN  COMPARISON:  CT 12/01/2014.  FLUOROSCOPY TIME:  Radiation Exposure Index (as provided by the fluoroscopic device):  If the device does not provide the exposure index:  Fluoroscopy Time (in minutes and seconds):  1 minutes 24 seconds  Number of Acquired Images:  18  FINDINGS: No esophageal stricture. No evidence of leak from the stomach. There is mild fold thickening in the distal stomach  suggesting gastritis. There is prompt emptying of the stomach into the jejunum. No anastomotic leak. No evidence of small bowel obstruction.  IMPRESSION: No evidence of anastomotic leak or obstruction. Mild fold thickening in the distal stomach likely reflects gastritis.   Electronically Signed   By: Rolm Baptise M.D.   On: 12/03/2014 12:53   [place] Image Guided Drainage By Percutaneous Catheter  12/02/2014   CLINICAL DATA:  59 year old female with a history of prior gastric bypass procedure with subsequent postoperative fluid collection in the upper abdomen. She has been referred for drainage.  EXAM: ULTRASOUND GUIDED DRAINAGE OF RIGHT UPPER QUADRANT FLUID COLLECTION  MEDICATIONS: 2.0 mg IV Versed; 200 mcg IV Fentanyl  Total Moderate Sedation Time: 20  PROCEDURE: The procedure, risks, benefits, and alternatives were explained to the patient. Questions regarding the procedure were encouraged and answered. The patient understands and consents to the procedure.  Patient is position supine position on a stretcher. Ultrasound survey of the upper abdomen was performed with images stored and sent to PACs.  The right upper quadrant was prepped with chlorhexidine in a sterile fashion, and a sterile drape was applied covering the operative field. A sterile gown and sterile gloves were used for the procedure. Local anesthesia was provided with 1% Lidocaine.  Using ultrasound guidance the skin and subcutaneous tissues were generously infiltrated with 1% lidocaine to the level of the abdominal wall.  Using ultrasound imaging a 10 French drain was advanced into the fluid collection using a  trocar technique.  Once the drain was in place approximately 90 cc of bilious fluid was aspirated. A sample sent to the lab.  Retention suture was placed in the drain was placed to gravity bag.  Patient tolerated the procedure well and remained hemodynamically stable throughout.  No complications encountered and no significant blood loss  encounter.  COMPLICATIONS: None.  FINDINGS: Ultrasound images demonstrate fluid collection overlying the liver.  90 cc of bilious fluid was drained from the fluid collection.  IMPRESSION: Status post ultrasound-guided drain placement into right upper quadrant fluid collection with 90 cc of bilious fluid aspirated.  Signed,  Dulcy Fanny. Earleen Newport, DO  [facility]  [facility]   Electronically Signed   By: Corrie Mckusick D.O.   On: 12/02/2014 18:08    Anti-infectives: Anti-infectives    Start     Dose/Rate Route Frequency Ordered Stop   12/01/14 2230  piperacillin-tazobactam (ZOSYN) IVPB 3.375 g     3.375 g 12.5 mL/hr over 240 Minutes Intravenous 3 times per day 12/01/14 2138     12/01/14 1315  fidaxomicin (DIFICID) tablet 200 mg     200 mg Oral 2 times daily 12/01/14 1304     11/30/14 1500  metroNIDAZOLE (FLAGYL) IVPB 500 mg  Status:  Discontinued     500 mg 100 mL/hr over 60 Minutes Intravenous Every 8 hours 11/30/14 0939 11/30/14 1400   11/30/14 1315  vancomycin (VANCOCIN) 50 mg/mL oral solution 500 mg  Status:  Discontinued     500 mg Oral 4 times per day 11/30/14 1312 11/30/14 1312   11/30/14 1230  fidaxomicin (DIFICID) tablet 200 mg  Status:  Discontinued     200 mg Oral 2 times daily 11/30/14 1227 12/01/14 1304   11/30/14 0630  metroNIDAZOLE (FLAGYL) IVPB 500 mg     500 mg 100 mL/hr over 60 Minutes Intravenous  Once 11/30/14 0626 11/30/14 0756      Assessment/Plan: s/p * No surgery found * Advance diet  Continue drain Continue zosyn  LOS: 4 days    TOTH III,PAUL S 12/04/2014

## 2014-12-04 NOTE — Progress Notes (Signed)
[facility] Progress Note   Shelly Hood  YQI:[id number]  DOB: 04-Feb-1956  DOA: 11/30/2014 PCP: Viviana Simpler, MD  Brief narrative: Shelly Hood is a 59 y.o. 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 The patient has had C. difficile colitis since April 20 16th. 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 Diarrhea recurred and she was placed on vancomycin on 8/22. She required a second course of vancomycin that was started on 9/14. She presented to the hospital with right upper quadrant pain. She states that she had had ongoing diarrhea prior to presentation to the hospital.  Subjective: Still sore in RUQ. Unable to take a deep breath due to pain. No nausea, 1 Bm today- stool was loose again. No fevers or chills. Tolerating clears.   Assessment/Plan: Principal Problem:   Clostridium difficile colitis/leukocytosis/fever/sepsis -Started on the Fidaxomycin by ID - No diarrhea as of yet -when necessary Zofran for nausea  Active Problems: Right upper quadrant pain - CT scan reveals perihepatic abscesses -cont  Zosyn - -Follow up cultures -IR consulted for drainage- currently has RUQ drain -History of gastric ulcer resulting in a stricture in outlet obstruction status post s/p robotic truncal vagotomy, distal gastrectomy, roux-en-y and hiatal hernia repair by Dr. Johney Maine on 06/24/14,  -Question of whether she may have had a gastric perforation which resulted in abscesses, Upper GI with Gastrograffin does not reveal a leak - advance diet    Elevated LFTs -Possibly in relation to sepsis-improved   Anorexia -Have ordered resource boost and ensure  Depression/anxiety -Continue Prozac and Xanax   Code Status:     Code Status Orders        Start      Ordered   11/30/14 0939  Full code   Continuous     11/30/14 0938    Advance Directive Documentation        Most Recent Value   Type of Advance Directive  Mental Health Advance Directive   Pre-existing out of facility DNR order (yellow form or pink MOST form)     "MOST" Form in Place?       Family Communication: Daughter and husband DVT prophylaxis: Lovenox Consultants: GI, ID, gen surgery  Antibiotics: Anti-infectives    Start     Dose/Rate Route Frequency Ordered Stop   12/01/14 2230  piperacillin-tazobactam (ZOSYN) IVPB 3.375 g     3.375 g 12.5 mL/hr over 240 Minutes Intravenous 3 times per day 12/01/14 2138     12/01/14 1315  fidaxomicin (DIFICID) tablet 200 mg     200 mg Oral 2 times daily 12/01/14 1304     11/30/14 1500  metroNIDAZOLE (FLAGYL) IVPB 500 mg  Status:  Discontinued     500 mg 100 mL/hr over 60 Minutes Intravenous Every 8 hours 11/30/14 0939 11/30/14 1400   11/30/14 1315  vancomycin (VANCOCIN) 50 mg/mL oral solution 500 mg  Status:  Discontinued     500 mg Oral 4 times per day 11/30/14 1312 11/30/14 1312   11/30/14 1230  fidaxomicin (DIFICID) tablet 200 mg  Status:  Discontinued     200 mg Oral 2 times daily 11/30/14 1227 12/01/14 1304   11/30/14 0630  metroNIDAZOLE (FLAGYL) IVPB 500 mg     500 mg 100 mL/hr over 60 Minutes Intravenous  Once 11/30/14 0626 11/30/14  2841      Objective: Filed Weights   11/30/14 0105  Weight: 63.192 kg (139 lb 5 oz)    Intake/Output Summary (Last 24 hours) at 12/04/14 0853 Last data filed at 12/04/14 3244  Gross per 24 hour  Intake   1640 ml  Output   1650 ml  Net    -10 ml     Vitals Filed Vitals:   12/03/14 1450 12/03/14 2157 12/03/14 2202 12/04/14 0636  BP:   114/46 115/86  Pulse:  69 70 77  Temp: 98.2 F (36.8 C) 97.7 F (36.5 C)  98.6 F (37 C)  TempSrc:  Oral  Oral  Resp:  18  20  Height:      Weight:      SpO2:  92% 94% 95%    Exam:  General:  Pt is alert, not in acute distress  HEENT: No  icterus, No thrush, oral mucosa moist  Cardiovascular: regular rate and rhythm, S1/S2 No murmur  Respiratory: clear to auscultation bilaterally   Abdomen: Soft, +Bowel sounds, tender in right upper quadrant, drain with bilious drainage, non distended, no guarding  MSK: No LE edema, cyanosis or clubbing  Data Reviewed: Basic Metabolic Panel:  Recent Labs Lab 11/30/14 0116 11/30/14 1043 12/01/14 0522 12/03/14 0422  NA 140  --  136 135  K 4.1  --  4.6 3.6  CL 103  --  102 103  CO2 27  --  27 24  GLUCOSE 160*  --  148* 126*  BUN 16  --  7 9  CREATININE 0.72 0.60 0.67 0.68  CALCIUM 9.2  --  9.0 7.9*   Liver Function Tests:  Recent Labs Lab 11/30/14 0116 12/01/14 0522 12/03/14 1219  AST 48* 19  --   ALT 59* 36  --   ALKPHOS 131* 95  --   BILITOT 0.3 0.4 0.9  PROT 7.2 6.6  --   ALBUMIN 3.5 2.8*  --     Recent Labs Lab 11/30/14 0116 12/03/14 1219  LIPASE 45  --   AMYLASE  --  53   No results for input(s): AMMONIA in the last 168 hours. CBC:  Recent Labs Lab 11/30/14 1043 12/01/14 0522 12/02/14 0415 12/03/14 0422 12/04/14 0402  WBC 13.6* 16.5* 13.4* 20.7* 15.4*  HGB 12.3 12.5 10.9* 10.6* 10.0*  HCT 37.9 37.3 32.5* 31.7* 30.6*  MCV 83.1 83.4 83.1 83.0 83.8  PLT 290 270 309 234 239   Cardiac Enzymes: No results for input(s): CKTOTAL, CKMB, CKMBINDEX, TROPONINI in the last 168 hours. BNP (last 3 results) No results for input(s): BNP in the last 8760 hours.  ProBNP (last 3 results) No results for input(s): PROBNP in the last 8760 hours.  CBG: No results for input(s): GLUCAP in the last 168 hours.  No results found for this or any previous visit (from the past 240 hour(s)).   Studies: Dg Ugi W/water Sol Cm  12/03/2014   CLINICAL DATA:  Abscesses around the stomach and liver. History of peptic ulcer disease and distal antrectomy with gastrojejunostomy. Concern for anastomotic leak.  EXAM: WATER SOLUBLE UPPER GI SERIES  TECHNIQUE: Single-column upper  GI series was performed using water soluble contrast.  CONTRAST:  61mL OMNIPAQUE IOHEXOL 300 MG/ML  SOLN  COMPARISON:  CT 12/01/2014.  FLUOROSCOPY TIME:  Radiation Exposure Index (as provided by the fluoroscopic device):  If the device does not provide the exposure index:  Fluoroscopy Time (in minutes and seconds):  1 minutes 24 seconds  Number of Acquired Images:  18  FINDINGS: No esophageal stricture. No evidence of leak from the stomach. There is mild fold thickening in the distal stomach suggesting gastritis. There is prompt emptying of the stomach into the jejunum. No anastomotic leak. No evidence of small bowel obstruction.  IMPRESSION: No evidence of anastomotic leak or obstruction. Mild fold thickening in the distal stomach likely reflects gastritis.   Electronically Signed   By: Rolm Baptise M.D.   On: 12/03/2014 12:53   [place] Image Guided Drainage By Percutaneous Catheter  12/02/2014   CLINICAL DATA:  59 year old female with a history of prior gastric bypass procedure with subsequent postoperative fluid collection in the upper abdomen. She has been referred for drainage.  EXAM: ULTRASOUND GUIDED DRAINAGE OF RIGHT UPPER QUADRANT FLUID COLLECTION  MEDICATIONS: 2.0 mg IV Versed; 200 mcg IV Fentanyl  Total Moderate Sedation Time: 20  PROCEDURE: The procedure, risks, benefits, and alternatives were explained to the patient. Questions regarding the procedure were encouraged and answered. The patient understands and consents to the procedure.  Patient is position supine position on a stretcher. Ultrasound survey of the upper abdomen was performed with images stored and sent to PACs.  The right upper quadrant was prepped with chlorhexidine in a sterile fashion, and a sterile drape was applied covering the operative field. A sterile gown and sterile gloves were used for the procedure. Local anesthesia was provided with 1% Lidocaine.  Using ultrasound guidance the skin and subcutaneous tissues were generously  infiltrated with 1% lidocaine to the level of the abdominal wall.  Using ultrasound imaging a 10 French drain was advanced into the fluid collection using a trocar technique.  Once the drain was in place approximately 90 cc of bilious fluid was aspirated. A sample sent to the lab.  Retention suture was placed in the drain was placed to gravity bag.  Patient tolerated the procedure well and remained hemodynamically stable throughout.  No complications encountered and no significant blood loss encounter.  COMPLICATIONS: None.  FINDINGS: Ultrasound images demonstrate fluid collection overlying the liver.  90 cc of bilious fluid was drained from the fluid collection.  IMPRESSION: Status post ultrasound-guided drain placement into right upper quadrant fluid collection with 90 cc of bilious fluid aspirated.  Signed,  Dulcy Fanny. Earleen Newport, DO  [facility]  [facility] Radiology   Electronically Signed   By: Corrie Mckusick D.O.   On: 12/02/2014 18:08    Scheduled Meds:  Scheduled Meds: . ALPRAZolam  0.25 mg Oral Daily  . atorvastatin  20 mg Oral q1800  . enoxaparin (LOVENOX) injection  40 mg Subcutaneous Daily  . famotidine  20 mg Oral BID  . fidaxomicin  200 mg Oral BID  . FLUoxetine  40 mg Oral BID  . lip balm   Topical BID  . multivitamins with iron  1 tablet Oral Daily  . piperacillin-tazobactam (ZOSYN)  IV  3.375 g Intravenous 3 times per day  . saccharomyces boulardii  250 mg Oral BID  . vitamin C  500 mg Oral BID   Continuous Infusions: . sodium chloride 100 mL/hr at 12/04/14 0123    Time spent on care of this patient: 75 min   [place], MD 12/04/2014, 8:53 AM  LOS: 4 days   [facility] Office  [phone] Pager - Text Page per [email or web address] If 7PM-7AM, please contact night-coverage [email or web address]

## 2014-12-05 ENCOUNTER — Inpatient Hospital Stay (HOSPITAL_COMMUNITY): Payer: BLUE CROSS/BLUE SHIELD

## 2014-12-05 DIAGNOSIS — A0472 Enterocolitis due to Clostridium difficile, not specified as recurrent: Secondary | ICD-10-CM

## 2014-12-05 DIAGNOSIS — R0600 Dyspnea, unspecified: Secondary | ICD-10-CM

## 2014-12-05 DIAGNOSIS — E440 Moderate protein-calorie malnutrition: Secondary | ICD-10-CM

## 2014-12-05 DIAGNOSIS — E43 Unspecified severe protein-calorie malnutrition: Secondary | ICD-10-CM | POA: Diagnosis present

## 2014-12-05 DIAGNOSIS — K2970 Gastritis, unspecified, without bleeding: Secondary | ICD-10-CM | POA: Diagnosis present

## 2014-12-05 LAB — CBC
HCT: 30.8 % — ABNORMAL LOW (ref 36.0–46.0)
HEMOGLOBIN: 10.3 g/dL — AB (ref 12.0–15.0)
MCH: 27.5 pg (ref 26.0–34.0)
MCHC: 33.4 g/dL (ref 30.0–36.0)
MCV: 82.4 fL (ref 78.0–100.0)
Platelets: 258 10*3/uL (ref 150–400)
RBC: 3.74 MIL/uL — AB (ref 3.87–5.11)
RDW: 15.4 % (ref 11.5–15.5)
WBC: 14.9 10*3/uL — ABNORMAL HIGH (ref 4.0–10.5)

## 2014-12-05 LAB — COMPREHENSIVE METABOLIC PANEL
ALT: 154 U/L — AB (ref 14–54)
ANION GAP: 10 (ref 5–15)
AST: 186 U/L — ABNORMAL HIGH (ref 15–41)
Albumin: 1.8 g/dL — ABNORMAL LOW (ref 3.5–5.0)
Alkaline Phosphatase: 226 U/L — ABNORMAL HIGH (ref 38–126)
BUN: <5 mg/dL — ABNORMAL LOW (ref 6–20)
CALCIUM: 7.8 mg/dL — AB (ref 8.9–10.3)
CHLORIDE: 98 mmol/L — AB (ref 101–111)
CO2: 27 mmol/L (ref 22–32)
CREATININE: 0.54 mg/dL (ref 0.44–1.00)
Glucose, Bld: 105 mg/dL — ABNORMAL HIGH (ref 65–99)
Potassium: 2.8 mmol/L — ABNORMAL LOW (ref 3.5–5.1)
SODIUM: 135 mmol/L (ref 135–145)
Total Bilirubin: 0.5 mg/dL (ref 0.3–1.2)
Total Protein: 5.5 g/dL — ABNORMAL LOW (ref 6.5–8.1)

## 2014-12-05 LAB — GRAM STAIN

## 2014-12-05 LAB — BODY FLUID CELL COUNT WITH DIFFERENTIAL
LYMPHS FL: 16 %
MONOCYTE-MACROPHAGE-SEROUS FLUID: 13 % — AB (ref 50–90)
NEUTROPHIL FLUID: 71 % — AB (ref 0–25)
WBC FLUID: 4140 uL — AB (ref 0–1000)

## 2014-12-05 LAB — LACTATE DEHYDROGENASE, PLEURAL OR PERITONEAL FLUID: LD, Fluid: 127 U/L — ABNORMAL HIGH (ref 3–23)

## 2014-12-05 LAB — GLUCOSE, SEROUS FLUID: GLUCOSE FL: 107 mg/dL

## 2014-12-05 LAB — LACTATE DEHYDROGENASE: LDH: 262 U/L — AB (ref 98–192)

## 2014-12-05 LAB — AMYLASE, PERITONEAL FLUID: Amylase, peritoneal fluid: >10000 U/L

## 2014-12-05 MED ORDER — NYSTATIN 100000 UNIT/ML MT SUSP
5.0000 mL | Freq: Four times a day (QID) | OROMUCOSAL | Status: DC
  Administered 2014-12-05 – 2014-12-10 (×21): 500000 [IU] via ORAL
  Filled 2014-12-05 (×21): qty 5

## 2014-12-05 MED ORDER — UNJURY CHICKEN SOUP POWDER
2.0000 [oz_av] | Freq: Two times a day (BID) | ORAL | Status: DC
  Administered 2014-12-06 – 2014-12-07 (×2): 2 [oz_av] via ORAL
  Filled 2014-12-05 (×5): qty 27

## 2014-12-05 MED ORDER — ENSURE ENLIVE PO LIQD
237.0000 mL | Freq: Two times a day (BID) | ORAL | Status: DC
  Administered 2014-12-05: 237 mL via ORAL

## 2014-12-05 MED ORDER — LIDOCAINE HCL (PF) 1 % IJ SOLN
INTRAMUSCULAR | Status: AC
  Filled 2014-12-05: qty 10

## 2014-12-05 MED ORDER — IOHEXOL 300 MG/ML  SOLN
50.0000 mL | Freq: Once | INTRAMUSCULAR | Status: DC | PRN
  Administered 2014-12-05: 10 mL via INTRAVENOUS
  Filled 2014-12-05: qty 50

## 2014-12-05 MED ORDER — GLUCERNA SHAKE PO LIQD
237.0000 mL | Freq: Two times a day (BID) | ORAL | Status: DC
  Administered 2014-12-06: 237 mL via ORAL

## 2014-12-05 NOTE — Progress Notes (Signed)
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 abscess w/o definite leak - improving s/p perc drainage & IV Abx  Gastritis  Hypoxia w effusion  Plan:  -drain output with normal bilirubin & amylase = not c/w leak -UGI not c/w leak  -adv diet.  Try supp shakes although dumping risk (might be good to see if she is at risk for dumping with shake challenge while an inpatient.  -postgastrectomy diet training reinforcement  -keep drain & do drain study in 5 days from drain placement (= 9/28 Wed) +/- Ct scan to r/o fistula to GJ/stomach (should've been seen on UGI) or to duodenal stump (which would not be seen with UGI).  Defer to [facility] on how they wish to do it.  If no abscess, no fistula, and output <64mL/day = pull drain.  O/w leave in place & setup outpatient drain clinic  -high dose acid blockade for presumed gastritis - switched off PPI given h/o recurrent CDiff.  H2B not as good but better than nothing I guess  -check gastrin level w recurrent gastrits after V&Antrectomy - pending  -consider EGD to eval gastritis, r/o stricture if no fistula confirmed on drain study Wed  -no good evid of colitis on CT scan = ?CDiff colonization & not colitis ?? - defer to ID.  Cont fidaxomicin  -chronic diarrhea for years = reconsdier etiology as not just C Diff.  Teach anti dumping diet  -cont ABx - f/u cultures.  WBC down & pain less so stick w  pip/tazo only if ID agrees.   If cultures negative stop after 5 days from abd collection standpoint.    -thoracentesis for new RLL effusion ? Check Cx?    [name], M.D., F.A.C.S. Gastrointestinal and Minimally Invasive Surgery [facility] [address] [place], [place] [address] [phone] Main / Paging   12/05/2014  Subjective:  Pain less Hypoxic - on oxygen Tol pureed diet  No N/V/bloating/emesis Dr Wynelle Cleveland w IM at bedside w me.  Objective:  Vital signs:  Filed Vitals:   12/04/14 1731 12/04/14 1733 12/04/14 2218 12/05/14 0600  BP:   103/62 128/60  Pulse:   69 71  Temp: 100.8 F (38.2 C)  98.6 F (37 C) 98.8 F (37.1 C)  TempSrc: Oral  Oral Oral  Resp:   18 20  Height:      Weight:      SpO2: 89% 94% 95% 96%    Last BM Date: 12/04/14  Intake/Output   Yesterday:  09/25 0701 - 09/26 0700 In: 1286.7 [P.O.:1080; I.V.:129.2; IV Piggyback:62.5] Out: 2350 [Urine:2300; Drains:50] This shift:  Total I/O In: 391.7 [P.O.:240; I.V.:79.2; Other:10; IV Piggyback:62.5] Out: 1600 [Urine:1550; Drains:50]  Bowel function:  Flatus: y  BM: y  Drain: serobilious  Physical Exam:  General: Pt awake/alert/oriented x4 in no acute distress Eyes: PERRL, normal EOM.  Sclera clear.  No  icterus Neuro: CN II-XII intact w/o focal sensory/motor deficits. Lymph: No head/neck/groin lymphadenopathy Psych:  No delerium/psychosis/paranoia HENT: Normocephalic, Mucus membranes moist.  No thrush Neck: Supple, No tracheal deviation Chest: No chest wall pain w good excursion CV:  Pulses intact.  Regular rhythm MS: Normal AROM mjr joints.  No obvious deformity Abdomen: Soft.  Nondistended.  Mildly tender at drain site only.  No evidence of peritonitis.  No incarcerated hernias. Ext:  SCDs BLE.  No mjr edema.  No cyanosis Skin: No petechiae / purpura  Results:   Labs: Results for orders placed or performed during the hospital encounter of  11/30/14 (from the past 48 hour(s))  Amylase     Status: None   Collection Time: 12/03/14 12:19 PM  Result Value Ref Range   Amylase 53 28 - 100 U/L  Bilirubin, total     Status: None   Collection Time: 12/03/14 12:19 PM  Result Value Ref Range   Total Bilirubin 0.9 0.3 - 1.2 mg/dL  CBC     Status: Abnormal   Collection Time: 12/04/14  4:02 AM  Result Value Ref Range   WBC 15.4 (H) 4.0 - 10.5 K/uL   RBC 3.65 (L) 3.87 - 5.11 MIL/uL   Hemoglobin 10.0 (L) 12.0 - 15.0 g/dL   HCT 30.6 (L) 36.0 - 46.0 %   MCV 83.8 78.0 - 100.0 fL   MCH 27.4 26.0 - 34.0 pg   MCHC 32.7 30.0 - 36.0 g/dL   RDW 15.7 (H) 11.5 - 15.5 %   Platelets 239 150 - 400 K/uL    Imaging / Studies: Dg Chest 2 View  12/04/2014   CLINICAL DATA:  Hypoxia.  Right upper quadrant pain.  EXAM: CHEST  2 VIEW  COMPARISON:  Included lung bases from CT abdomen/ pelvis 12/01/2014  FINDINGS: Moderate right pleural effusion with adjacent basilar airspace disease, appears increased from prior CT. Minimal blunting of left costophrenic angle, likely small left pleural effusion. There is no pulmonary edema. Heart size appears normal, mediastinal contours partially obscured. No pneumothorax. No acute osseous abnormalities are seen. A drain is present in the right upper quadrant of the abdomen.  IMPRESSION: Increased size of right pleural effusion, now moderate in degree. Associated basilar airspace disease, likely compressive atelectasis. Small left pleural effusion.   Electronically Signed   By: Jeb Levering M.D.   On: 12/04/2014 18:51   Dg Duanne Limerick W/water Sol Cm  12/03/2014   CLINICAL DATA:  Abscesses around the stomach and liver. History of peptic ulcer disease and distal antrectomy with gastrojejunostomy. Concern for anastomotic leak.  EXAM: WATER SOLUBLE UPPER GI SERIES  TECHNIQUE: Single-column upper GI series was performed using water soluble contrast.  CONTRAST:  63mL OMNIPAQUE IOHEXOL 300 MG/ML  SOLN  COMPARISON:  CT 12/01/2014.   FLUOROSCOPY TIME:  Radiation Exposure Index (as provided by the fluoroscopic device):  If the device does not provide the exposure index:  Fluoroscopy Time (in minutes and seconds):  1 minutes 24 seconds  Number of Acquired Images:  18  FINDINGS: No esophageal stricture. No evidence of leak from the stomach. There is mild fold thickening in the distal stomach suggesting gastritis. There is prompt emptying of the stomach into the jejunum. No anastomotic leak. No evidence of small bowel obstruction.  IMPRESSION: No evidence of anastomotic leak or obstruction. Mild fold thickening in the distal stomach likely reflects gastritis.   Electronically Signed   By: Rolm Baptise M.D.   On: 12/03/2014 12:53    Medications /  Allergies: per chart  Antibiotics: Anti-infectives    Start     Dose/Rate Route Frequency Ordered Stop   12/01/14 2230  piperacillin-tazobactam (ZOSYN) IVPB 3.375 g     3.375 g 12.5 mL/hr over 240 Minutes Intravenous 3 times per day 12/01/14 2138     12/01/14 1315  fidaxomicin (DIFICID) tablet 200 mg     200 mg Oral 2 times daily 12/01/14 1304     11/30/14 1500  metroNIDAZOLE (FLAGYL) IVPB 500 mg  Status:  Discontinued     500 mg 100 mL/hr over 60 Minutes Intravenous Every 8 hours 11/30/14 0939 11/30/14 1400   11/30/14 1315  vancomycin (VANCOCIN) 50 mg/mL oral solution 500 mg  Status:  Discontinued     500 mg Oral 4 times per day 11/30/14 1312 11/30/14 1312   11/30/14 1230  fidaxomicin (DIFICID) tablet 200 mg  Status:  Discontinued     200 mg Oral 2 times daily 11/30/14 1227 12/01/14 1304   11/30/14 0630  metroNIDAZOLE (FLAGYL) IVPB 500 mg     500 mg 100 mL/hr over 60 Minutes Intravenous  Once 11/30/14 3570 11/30/14 0756        Note: Portions of this report may have been transcribed using voice recognition software. Every effort was made to ensure accuracy; however, inadvertent computerized transcription errors may be present.   Any transcriptional errors that result from this  process are unintentional.     [name], M.D., F.A.C.S. Gastrointestinal and Minimally Invasive Surgery [facility] [address], [place], [place] [address] [phone] Main / Paging   12/05/2014  CARE TEAM:  PCP: Viviana Simpler, MD  Outpatient Care Team: Patient Care Team: Venia Carbon, MD as PCP - General Ladene Artist, MD as Consulting Physician (Gastroenterology) Michael Boston, MD as Consulting Physician (General Surgery)  Inpatient Treatment Team: Treatment Team: Attending Provider: Debbe Odea, MD; Attending Physician: Theodis Blaze, MD; Registered Nurse: Suzan Nailer, RN; Consulting Physician: Jerene Bears, MD; Registered Nurse: Donzetta Matters, RN; Technician: Roger Shelter, NT; Rounding Team: Minerva Ends, MD; Technician: Francene Finders, NT; Registered Nurse: Roselind Rily, RN; Registered Nurse: Zachery Conch; Consulting Physician: Michael Boston, MD; Technician: Mort Sawyers, NT; Technician: Army Chaco, NT; Technician: Wyline Copas, NT

## 2014-12-05 NOTE — Progress Notes (Addendum)
ANTIBIOTIC CONSULT NOTE - FOLLOW UP  Pharmacy Consult:  Zosyn Indication:  Intra-abdominal infection  Allergies  Allergen Reactions  . Cefotetan Other (See Comments)    Thrombocytopenia, plat ct 18K  . Naproxen     ulcers  . Nsaids     DUODENAL ULCER  . Aspirin     Ulcers   . Codeine Sulfate     REACTION: nausea only tolerates hydrocodone  . Flagyl [Metronidazole] Other (See Comments)    Weak, bad taste   . Ibuprofen     ulcers   . Toradol [Ketorolac Tromethamine]     ulcers    Patient Measurements: Height: 4\' 11"  (149.9 cm) Weight: 139 lb 5 oz (63.192 kg) IBW/kg (Calculated) : 43.2  Vital Signs: Temp: 98.8 F (37.1 C) (09/26 0600) Temp Source: Oral (09/26 0600) BP: 134/65 mmHg (09/26 1136) Pulse Rate: 71 (09/26 0600) Intake/Output from previous day: 09/25 0701 - 09/26 0700 In: 1286.7 [P.O.:1080; I.V.:129.2; IV Piggyback:62.5] Out: 2350 [Urine:2300; Drains:50] Intake/Output from this shift: Total I/O In: 240 [P.O.:240] Out: 200 [Urine:150; Drains:50]  Labs:  Recent Labs  12/03/14 0422 12/04/14 0402 12/05/14 0751  WBC 20.7* 15.4* 14.9*  HGB 10.6* 10.0* 10.3*  PLT 234 239 258  CREATININE 0.68  --   --    Estimated Creatinine Clearance: 63.5 mL/min (by C-G formula based on Cr of 0.68). No results for input(s): VANCOTROUGH, VANCOPEAK, VANCORANDOM, GENTTROUGH, GENTPEAK, GENTRANDOM, TOBRATROUGH, TOBRAPEAK, TOBRARND, AMIKACINPEAK, AMIKACINTROU, AMIKACIN in the last 72 hours.   Microbiology: Recent Results (from the past 720 hour(s))  Clostridium Difficile by PCR     Status: Abnormal   Collection Time: 11/23/14  2:40 PM  Result Value Ref Range Status   Toxigenic C Difficile by pcr Detected (AA) Not Detected Final    Comment: This test is for use only with liquid or soft stools; performance characteristics of other clinical specimen types have not been established.   This assay was performed by Cepheid GeneXpert(R) PCR. The performance characteristics  of this assay have been determined by [facility]. Performance characteristics refer to the analytical performance of the test.   Stool Culture     Status: None   Collection Time: 11/23/14  2:50 PM  Result Value Ref Range Status   Organism ID, Bacteria No Salmonella,Shigella,Campylobacter,Yersinia,or  Final   Organism ID, Bacteria No E.coli [id number]:H7 isolated.  Final  Culture, routine-abscess     Status: None (Preliminary result)   Collection Time: 12/02/14  6:32 PM  Result Value Ref Range Status   Specimen Description ABSCESS ABDOMEN  Final   Special Requests NONE  Final   Gram Stain PENDING  Incomplete   Culture   Final    MULTIPLE ORGANISMS PRESENT, NONE PREDOMINANT Performed at [facility]    Report Status PENDING  Incomplete      Assessment: 49 YOF continues on Zosyn for intra-abdominal infection and Dificid for refractory C.diff.  Patient's renal function has been stable.  Dificid 9/21 >> Zosyn 9/22 >> PO Vanc PTA  9/23 abd abscess - NGTD 9/26 body fluid cx -    Goal of Therapy:  Resolution of infection   Plan:  - Zosyn 3.375 gm IV Q8H, 4 hr infusion - Pharmacy will sign off as dosage adjustment is likely unnecessary.  Thank you for the consult!    Thuy D. Mina Marble, PharmD, BCPS Pager:  [phone] 12/05/2014, 12:03 PM

## 2014-12-05 NOTE — Progress Notes (Signed)
Nutrition Follow-up  DOCUMENTATION CODES:   Not applicable  INTERVENTION:   -D/c Ensure Enlive due to poor acceptance -Glucerna Shake po BID, each supplement provides 220 kcal and 10 grams of protein  -Unjury BID  NUTRITION DIAGNOSIS:   Inadequate oral intake related to altered GI function as evidenced by meal completion < 50%.  Ongoing  GOAL:   Patient will meet greater than or equal to 90% of their needs  Progressing  MONITOR:   PO intake, Supplement acceptance, Diet advancement, Labs, Weight trends, Skin, I & O's  REASON FOR ASSESSMENT:   Malnutrition Screening Tool    ASSESSMENT:   Patient is 59 year old female with known history of pyloric ulcer, esophageal reflux disease, pyloric stenosis, gastric outlet obstruction and status post laparoscopic Roux-en-Y bypass and hiatal hernia repair, and 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 Carlean Purl, now presented to [facility] emergency department with progressively worsening watery diarrhea especially over the past 24 hours, associated with constant and sharp right upper and mid area abdominal pain, 10/10 in severity and occasionally but not consistently radiating to entire abdominal area.   Pt out of room for thoarcentesis at time of visit.   Spoke with RN, who reports that she has not observed pt eat anything today. GI following; concern for potential of dumping syndrome. Currently receiving Ensure supplement, however, pt reports experiencing diarrhea when drinking per chart review.   ID following. Pt is c-diff positive.  Pt s/p perc drain placement on 12/02/14.   Intake has been variable; PO: 10-100%, averaging 25%.   RD will return at a later for diet education due to pt unavailable.   Labs reviewed.  Diet Order:  Diet NPO time specified Except for: Sips with Meds, Ice Chips Diet regular Room service appropriate?: Yes; Fluid consistency:: Thin  Skin:  Reviewed, no  issues  Last BM:  12/05/14  Height:   Ht Readings from Last 1 Encounters:  11/30/14 4\' 11"  (1.499 m)    Weight:   Wt Readings from Last 1 Encounters:  11/30/14 139 lb 5 oz (63.192 kg)    Ideal Body Weight:  44.5 kg  BMI:  Body mass index is 28.12 kg/(m^2).  Estimated Nutritional Needs:   Kcal:  1700-1900  Protein:  80-95 grams  Fluid:  1.7-1.9 L  EDUCATION NEEDS:   No education needs identified at this time  Jenifer A. Jimmye Norman, RD, LDN, CDE Pager: [phone] After hours Pager: [phone]

## 2014-12-05 NOTE — Progress Notes (Signed)
Daily Rounding Note  12/05/2014, 11:25 AM  LOS: 5 days   SUBJECTIVE:       Has lose stools after she drinks the milky type ensure.  Some pain/discomfort at site of drain in RUQ.  Drainage is yellowish, was greenish early on.  40ml collected yesterday and 50cc so far today.   On puree diet but no dysphagia  OBJECTIVE:         Vital signs in last 24 hours:    Temp:  [98.3 F (36.8 C)-101.8 F (38.8 C)] 98.8 F (37.1 C) (09/26 0600) Pulse Rate:  [69-72] 71 (09/26 0600) Resp:  [18-20] 20 (09/26 0600) BP: (103-135)/(57-62) 135/62 mmHg (09/26 1105) SpO2:  [89 %-96 %] 96 % (09/26 0600) Last BM Date: 12/04/14 Filed Weights   11/30/14 0105  Weight: 139 lb 5 oz (63.192 kg)   General: looks well.  NAD   Heart: RRR Chest: clear bil.  Abdomen: yellowish, opaque,  Non-bloody drainage in the drain bag.  Some tenderness in region of drain site.  Active BS  Extremities: no CCE Neuro/Psych:  Pleasant, calm, alert.   Not confused.    Intake/Output from previous day: 09/25 0701 - 09/26 0700 In: 1286.7 [P.O.:1080; I.V.:129.2; IV Piggyback:62.5] Out: 2350 [Urine:2300; Drains:50]  Intake/Output this shift: Total I/O In: 240 [P.O.:240] Out: 200 [Urine:150; Drains:50]  Lab Results:  Recent Labs  12/03/14 0422 12/04/14 0402 12/05/14 0751  WBC 20.7* 15.4* 14.9*  HGB 10.6* 10.0* 10.3*  HCT 31.7* 30.6* 30.8*  PLT 234 239 258   BMET  Recent Labs  12/03/14 0422  NA 135  K 3.6  CL 103  CO2 24  GLUCOSE 126*  BUN 9  CREATININE 0.68  CALCIUM 7.9*   LFT  Recent Labs  12/03/14 1219  BILITOT 0.9   PT/INR No results for input(s): LABPROT, INR in the last 72 hours. Hepatitis Panel No results for input(s): HEPBSAG, HCVAB, HEPAIGM, HEPBIGM in the last 72 hours.  Studies/Results: Dg Chest 2 View  12/04/2014   CLINICAL DATA:  Hypoxia.  Right upper quadrant pain.  EXAM: CHEST  2 VIEW  COMPARISON:  Included lung bases  from CT abdomen/ pelvis 12/01/2014  FINDINGS: Moderate right pleural effusion with adjacent basilar airspace disease, appears increased from prior CT. Minimal blunting of left costophrenic angle, likely small left pleural effusion. There is no pulmonary edema. Heart size appears normal, mediastinal contours partially obscured. No pneumothorax. No acute osseous abnormalities are seen. A drain is present in the right upper quadrant of the abdomen.  IMPRESSION: Increased size of right pleural effusion, now moderate in degree. Associated basilar airspace disease, likely compressive atelectasis. Small left pleural effusion.   Electronically Signed   By: Jeb Levering M.D.   On: 12/04/2014 18:51   Dg Duanne Limerick W/water Sol Cm  12/03/2014   CLINICAL DATA:  Abscesses around the stomach and liver. History of peptic ulcer disease and distal antrectomy with gastrojejunostomy. Concern for anastomotic leak.  EXAM: WATER SOLUBLE UPPER GI SERIES  TECHNIQUE: Single-column upper GI series was performed using water soluble contrast.  CONTRAST:  42mL OMNIPAQUE IOHEXOL 300 MG/ML  SOLN  COMPARISON:  CT 12/01/2014.  FLUOROSCOPY TIME:  Radiation Exposure Index (as provided by the fluoroscopic device):  If the device does not provide the exposure index:  Fluoroscopy Time (in minutes and seconds):  1 minutes 24 seconds  Number of Acquired Images:  18  FINDINGS: No esophageal stricture. No evidence of leak from the  stomach. There is mild fold thickening in the distal stomach suggesting gastritis. There is prompt emptying of the stomach into the jejunum. No anastomotic leak. No evidence of small bowel obstruction.  IMPRESSION: No evidence of anastomotic leak or obstruction. Mild fold thickening in the distal stomach likely reflects gastritis.   Electronically Signed   By: Rolm Baptise M.D.   On: 12/03/2014 12:53   Scheduled Meds: . ALPRAZolam  0.25 mg Oral Daily  . antiseptic oral rinse  7 mL Mouth Rinse BID  . atorvastatin  20 mg Oral  q1800  . famotidine  20 mg Oral BID  . feeding supplement (ENSURE ENLIVE)  237 mL Oral BID BM  . fidaxomicin  200 mg Oral BID  . FLUoxetine  40 mg Oral BID  . lidocaine (PF)      . lip balm   Topical BID  . multivitamins with iron  1 tablet Oral Daily  . nystatin  5 mL Oral QID  . piperacillin-tazobactam (ZOSYN)  IV  3.375 g Intravenous 3 times per day  . saccharomyces boulardii  250 mg Oral BID  . 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 On Dificid.and Florastor. No diarrhea.   *  Right upper quadrant abscess of undetermined etiology, s/p perc drain 9/23.  Rule out breakdown of surgical anastomosis versus perforated PUD (remnant gastric thickening suggestive of gastritis versus ulcer disease but no contrast extravasation on CT 12/01/14) On Zosyn.  Culture of abscess drainage revealing multiple organisms, none predominant. Plan for IR to inject abscess today.     *  History PUD, pyloric stenosis, GOO.   EGD 12/2013 showing pyloric ulcer, pyloric stenosis and partial obstruction which was treated with balloon dilatation.  Follow-up EGD 03/2014 showed healing pyloric channel ulcer. Pyloric stenosis again balloon dilated.  S/P 06/24/14 robotic vagotomy, distal gastrectomy, Roux-en-Y, hiatal hernia repair.  *  Right > left pleural effusion.  S/p 12/05/14 right thoracentesis.  Feels less SOB   PLAN   *  IR contrast injection and study of abscess today.    *  Will discuss advancing to regular diet with Dr Fuller Plan.  Change to clear  nutrition supplements.     Azucena Freed  12/05/2014, 11:25 AM Pager: [phone]     Attending physician's note   I have taken an interval history, reviewed the chart and examined the patient. I agree with the Advanced Practitioner's note, impression and recommendations. Agree with CCS plans to further evaluate source of abscess which showed multiple organisms. C diff currently managed with Dificid and Florastor without diarrhea. Will follow ID recommendations. In addition to recurrent C diff, her diarrhea over the past few months could be partially related to dumping syndrome. Agree with advancing diet as tolerated with nutrition consult and CCS recommendations for presumed dumping syndrome. She did not tolerate Ensure so will stop this. GI is available if needed. Please call if we can assist.   Norberto Sorenson T. Fuller Plan, MD FACG [phone] Mon-Fri 8a-5p [phone] Mon-Fri 5p-8a, weekends, holidays or per [facility]

## 2014-12-05 NOTE — Procedures (Signed)
Successful US guided right thoracentesis. Yielded 700 ml of serous fluid. Pt tolerated procedure well. No immediate complications.  Specimen was sent for labs. CXR ordered.  Tsosie Billing D PA-C 12/05/2014 11:36 AM

## 2014-12-05 NOTE — Progress Notes (Addendum)
[facility] Progress Note   Shelly Hood  ZOX:[id number]  DOB: 1955-06-10  DOA: 11/30/2014 PCP: Viviana Simpler, MD  Brief narrative: Shelly Hood is a 59 y.o. 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 The patient has had C. difficile colitis since April 20 16th. 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 Diarrhea recurred and she was placed on vancomycin on 8/22. She required a second course of vancomycin that was started on 9/14. She presented to the hospital with right upper quadrant pain. She states that she had had ongoing diarrhea prior to presentation to the hospital.  Subjective: Continues to be sore in right upper quadrant. No cough or shortness of breath. No recurrence of diarrhea.  Assessment/Plan: Principal Problem:  Sepsis-leukocytosis/fever (A)  Clostridium difficile colitis -Started on the Fidaxomycin by ID - No recurrent diarrhea as of yet  (B) perihepatic abscesses -noted on CT scan - -IR consulted for drainage- currently has RUQ drain -cont  Zosyn - -Follow up cultures- ID assisting with management -Continues to spike fevers-temperature was 101.8 yesterday evening -History of gastric ulcer resulting in a stricture in outlet obstruction status post s/p robotic truncal vagotomy, distal gastrectomy, roux-en-y and hiatal hernia repair by Dr. Johney Maine on 06/24/14,  -Question of whether she may have had a gastric perforation or leaking anastomosis which resulted in abscesses-  Upper GI with Gastrograffin does not reveal a leak - advanced diet  Hypoxia -Has a moderate right-sided pleural effusion-possibly reactive from above-mentioned abscesses - check 2-D echo to determine if she has underlying heart failure -Thoracentesis performed  today-fluid sent for further workup    Elevated LFTs -Possibly in relation to sepsis-improved   Anorexia -Have ordered resource boost and ensure  Depression/anxiety -Continue Prozac and Xanax   Code Status:     Code Status Orders        Start     Ordered   11/30/14 0939  Full code   Continuous     11/30/14 0938    Advance Directive Documentation        Most Recent Value   Type of Advance Directive  Mental Health Advance Directive   Pre-existing out of facility DNR order (yellow form or pink MOST form)     "MOST" Form in Place?       Family Communication: Daughter and husband DVT prophylaxis: Lovenox Consultants: GI, ID, gen surgery, IR  Antibiotics: Anti-infectives    Start     Dose/Rate Route Frequency Ordered Stop   12/01/14 2230  piperacillin-tazobactam (ZOSYN) IVPB 3.375 g     3.375 g 12.5 mL/hr over 240 Minutes Intravenous 3 times per day 12/01/14 2138     12/01/14 1315  fidaxomicin (DIFICID) tablet 200 mg     200 mg Oral 2 times daily 12/01/14 1304     11/30/14 1500  metroNIDAZOLE (FLAGYL) IVPB 500 mg  Status:  Discontinued     500 mg 100 mL/hr over 60 Minutes Intravenous Every 8 hours 11/30/14 0939 11/30/14 1400   11/30/14 1315  vancomycin (VANCOCIN) 50 mg/mL oral solution 500 mg  Status:  Discontinued     500 mg Oral 4 times per day 11/30/14 1312 11/30/14 1312   11/30/14 1230  fidaxomicin (DIFICID) tablet 200 mg  Status:  Discontinued     200 mg Oral 2 times daily  11/30/14 1227 12/01/14 1304   11/30/14 0630  metroNIDAZOLE (FLAGYL) IVPB 500 mg     500 mg 100 mL/hr over 60 Minutes Intravenous  Once 11/30/14 0626 11/30/14 0756      Objective: Filed Weights   11/30/14 0105  Weight: 63.192 kg (139 lb 5 oz)    Intake/Output Summary (Last 24 hours) at 12/05/14 1239 Last data filed at 12/05/14 1215  Gross per 24 hour  Intake 1501.67 ml  Output   2150 ml  Net -648.33 ml     Vitals Filed Vitals:   12/05/14 1105 12/05/14 1116 12/05/14 1126 12/05/14  1136  BP: 135/62 121/57 128/60 134/65  Pulse:      Temp:      TempSrc:      Resp:      Height:      Weight:      SpO2:        Exam:  General:  Pt is alert, not in acute distress  HEENT: No icterus, No thrush, oral mucosa moist  Cardiovascular: regular rate and rhythm, S1/S2 No murmur  Respiratory: clear to auscultation bilaterally - decreased breath sounds in right lower lobe  Abdomen: Soft, +Bowel sounds, tender in right upper quadrant, drain with bilious drainage, non distended, no guarding  MSK: No LE edema, cyanosis or clubbing  Data Reviewed: Basic Metabolic Panel:  Recent Labs Lab 11/30/14 0116 11/30/14 1043 12/01/14 0522 12/03/14 0422  NA 140  --  136 135  K 4.1  --  4.6 3.6  CL 103  --  102 103  CO2 27  --  27 24  GLUCOSE 160*  --  148* 126*  BUN 16  --  7 9  CREATININE 0.72 0.60 0.67 0.68  CALCIUM 9.2  --  9.0 7.9*   Liver Function Tests:  Recent Labs Lab 11/30/14 0116 12/01/14 0522 12/03/14 1219  AST 48* 19  --   ALT 59* 36  --   ALKPHOS 131* 95  --   BILITOT 0.3 0.4 0.9  PROT 7.2 6.6  --   ALBUMIN 3.5 2.8*  --     Recent Labs Lab 11/30/14 0116 12/03/14 1219  LIPASE 45  --   AMYLASE  --  53   No results for input(s): AMMONIA in the last 168 hours. CBC:  Recent Labs Lab 12/01/14 0522 12/02/14 0415 12/03/14 0422 12/04/14 0402 12/05/14 0751  WBC 16.5* 13.4* 20.7* 15.4* 14.9*  HGB 12.5 10.9* 10.6* 10.0* 10.3*  HCT 37.3 32.5* 31.7* 30.6* 30.8*  MCV 83.4 83.1 83.0 83.8 82.4  PLT 270 309 234 239 258   Cardiac Enzymes: No results for input(s): CKTOTAL, CKMB, CKMBINDEX, TROPONINI in the last 168 hours. BNP (last 3 results) No results for input(s): BNP in the last 8760 hours.  ProBNP (last 3 results) No results for input(s): PROBNP in the last 8760 hours.  CBG: No results for input(s): GLUCAP in the last 168 hours.  Recent Results (from the past 240 hour(s))  Culture, routine-abscess     Status: None (Preliminary result)    Collection Time: 12/02/14  6:32 PM  Result Value Ref Range Status   Specimen Description ABSCESS ABDOMEN  Final   Special Requests NONE  Final   Gram Stain   Final    NO WBC SEEN NO SQUAMOUS EPITHELIAL CELLS SEEN NO ORGANISMS SEEN Performed at [facility]    Culture   Final    MULTIPLE ORGANISMS PRESENT, NONE PREDOMINANT Performed at [facility]  Report Status PENDING  Incomplete  Culture, body fluid-bottle     Status: None (Preliminary result)   Collection Time: 12/05/14 11:15 AM  Result Value Ref Range Status   Specimen Description FLUID RIGHT PLEURAL  Final   Special Requests BAA 10CCS  Final   Culture PENDING  Incomplete   Report Status PENDING  Incomplete     Studies: Dg Chest 1 View  12/05/2014   CLINICAL DATA:  Status post right-sided thoracentesis.  EXAM: CHEST 1 VIEW  COMPARISON:  December 04, 2014.  FINDINGS: Stable cardiomediastinal silhouette. No pneumothorax is noted. Right pleural effusion noted on prior exam is significantly smaller status post thoracentesis, although mild residual effusion remains. Elevated right hemidiaphragm is noted as well. Stable interstitial densities are noted in the left lung concerning for pulmonary edema. Bony thorax is unremarkable.  IMPRESSION: No pneumothorax status post right-sided thoracentesis. Right pleural effusion is significantly smaller compared to prior exam. Stable interstitial densities noted in left lung concerning for pulmonary edema.   Electronically Signed   By: Marijo Conception, M.D.   On: 12/05/2014 11:56   Dg Chest 2 View  12/04/2014   CLINICAL DATA:  Hypoxia.  Right upper quadrant pain.  EXAM: CHEST  2 VIEW  COMPARISON:  Included lung bases from CT abdomen/ pelvis 12/01/2014  FINDINGS: Moderate right pleural effusion with adjacent basilar airspace disease, appears increased from prior CT. Minimal blunting of left costophrenic angle, likely small left pleural effusion. There is no pulmonary edema. Heart  size appears normal, mediastinal contours partially obscured. No pneumothorax. No acute osseous abnormalities are seen. A drain is present in the right upper quadrant of the abdomen.  IMPRESSION: Increased size of right pleural effusion, now moderate in degree. Associated basilar airspace disease, likely compressive atelectasis. Small left pleural effusion.   Electronically Signed   By: Jeb Levering M.D.   On: 12/04/2014 18:51   Dg Duanne Limerick W/water Sol Cm  12/03/2014   CLINICAL DATA:  Abscesses around the stomach and liver. History of peptic ulcer disease and distal antrectomy with gastrojejunostomy. Concern for anastomotic leak.  EXAM: WATER SOLUBLE UPPER GI SERIES  TECHNIQUE: Single-column upper GI series was performed using water soluble contrast.  CONTRAST:  57mL OMNIPAQUE IOHEXOL 300 MG/ML  SOLN  COMPARISON:  CT 12/01/2014.  FLUOROSCOPY TIME:  Radiation Exposure Index (as provided by the fluoroscopic device):  If the device does not provide the exposure index:  Fluoroscopy Time (in minutes and seconds):  1 minutes 24 seconds  Number of Acquired Images:  18  FINDINGS: No esophageal stricture. No evidence of leak from the stomach. There is mild fold thickening in the distal stomach suggesting gastritis. There is prompt emptying of the stomach into the jejunum. No anastomotic leak. No evidence of small bowel obstruction.  IMPRESSION: No evidence of anastomotic leak or obstruction. Mild fold thickening in the distal stomach likely reflects gastritis.   Electronically Signed   By: Rolm Baptise M.D.   On: 12/03/2014 12:53    Scheduled Meds:  Scheduled Meds: . ALPRAZolam  0.25 mg Oral Daily  . antiseptic oral rinse  7 mL Mouth Rinse BID  . atorvastatin  20 mg Oral q1800  . famotidine  20 mg Oral BID  . feeding supplement (ENSURE ENLIVE)  237 mL Oral BID BM  . fidaxomicin  200 mg Oral BID  . FLUoxetine  40 mg Oral BID  . lidocaine (PF)      . lip balm   Topical BID  . multivitamins  with iron  1 tablet  Oral Daily  . nystatin  5 mL Oral QID  . piperacillin-tazobactam (ZOSYN)  IV  3.375 g Intravenous 3 times per day  . saccharomyces boulardii  250 mg Oral BID  . vitamin C  500 mg Oral BID   Continuous Infusions:    Time spent on care of this patient: 46 min   Buckner, MD 12/05/2014, 12:39 PM  LOS: 5 days   [facility] Office  [phone] Pager - Text Page per [email or web address] If 7PM-7AM, please contact night-coverage [email or web address]

## 2014-12-05 NOTE — Progress Notes (Signed)
  Echocardiogram 2D Echocardiogram has been performed.  Jennette Dubin 12/05/2014, 1:59 PM

## 2014-12-05 NOTE — Progress Notes (Signed)
Referring Physician(s): CCS  Chief Complaint: RUQ abscess s/p perc drain 12/02/14  Subjective: Patient denies any worsening pain, soreness at drain site  Allergies: Cefotetan; Naproxen; Nsaids; Aspirin; Codeine sulfate; Flagyl; Ibuprofen; and Toradol  Medications: Prior to Admission medications   Medication Sig Start Date End Date Taking? Authorizing Provider  acetaminophen (TYLENOL) 500 MG tablet Take 1,000 mg by mouth every 6 (six) hours as needed for mild pain or fever (pain and fever).    Yes Historical Provider, MD  ALPRAZolam (XANAX) 0.25 MG tablet Take 0.25 mg by mouth daily.    Yes Historical Provider, MD  atorvastatin (LIPITOR) 20 MG tablet Take 1 tablet (20 mg total) by mouth daily at 6 PM. Patient taking differently: Take 20 mg by mouth daily.  04/27/14  Yes Venia Carbon, MD  FLUoxetine (PROZAC) 40 MG capsule Take 1 capsule (40 mg total) by mouth daily. 04/27/14  Yes Venia Carbon, MD  saccharomyces boulardii (FLORASTOR) 250 MG capsule Take 500 mg by mouth daily.   Yes Historical Provider, MD  vancomycin (VANCOCIN HCL) 125 MG capsule Take 1 capsule (125 mg total) by mouth 4 (four) times daily. 11/23/14  Yes Carlyle Basques, MD  diphenoxylate-atropine (LOMOTIL) 2.5-0.025 MG per tablet Take 1 or 2 tablets prior to each meal as needed for diarrhea Patient not taking: Reported on 11/10/2014 11/04/14   Gatha Mayer, MD  pantoprazole (PROTONIX) 40 MG tablet Take 1 tablet (40 mg total) by mouth 2 (two) times daily before a meal. 12/02/14   Michael Boston, MD   Vital Signs: BP 134/65 mmHg  Pulse 71  Temp(Src) 98.8 F (37.1 C) (Oral)  Resp 20  Ht 4\' 11"  (1.499 m)  Wt 139 lb 5 oz (63.192 kg)  BMI 28.12 kg/m2  SpO2 96%  Physical Exam General: A&Ox3, NAD ABD: Soft, NT, RUQ drain intact with no current output in bag  Imaging: Dg Chest 1 View  12/05/2014   CLINICAL DATA:  Status post right-sided thoracentesis.  EXAM: CHEST 1 VIEW  COMPARISON:  December 04, 2014.   FINDINGS: Stable cardiomediastinal silhouette. No pneumothorax is noted. Right pleural effusion noted on prior exam is significantly smaller status post thoracentesis, although mild residual effusion remains. Elevated right hemidiaphragm is noted as well. Stable interstitial densities are noted in the left lung concerning for pulmonary edema. Bony thorax is unremarkable.  IMPRESSION: No pneumothorax status post right-sided thoracentesis. Right pleural effusion is significantly smaller compared to prior exam. Stable interstitial densities noted in left lung concerning for pulmonary edema.   Electronically Signed   By: Marijo Conception, M.D.   On: 12/05/2014 11:56   Dg Chest 2 View  12/04/2014   CLINICAL DATA:  Hypoxia.  Right upper quadrant pain.  EXAM: CHEST  2 VIEW  COMPARISON:  Included lung bases from CT abdomen/ pelvis 12/01/2014  FINDINGS: Moderate right pleural effusion with adjacent basilar airspace disease, appears increased from prior CT. Minimal blunting of left costophrenic angle, likely small left pleural effusion. There is no pulmonary edema. Heart size appears normal, mediastinal contours partially obscured. No pneumothorax. No acute osseous abnormalities are seen. A drain is present in the right upper quadrant of the abdomen.  IMPRESSION: Increased size of right pleural effusion, now moderate in degree. Associated basilar airspace disease, likely compressive atelectasis. Small left pleural effusion.   Electronically Signed   By: Jeb Levering M.D.   On: 12/04/2014 18:51   Ct Abdomen Pelvis W Contrast  12/01/2014   CLINICAL DATA:  Nausea, fever and right upper quadrant abdominal pain for 6 days. 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  EXAM: CT ABDOMEN AND PELVIS WITH CONTRAST  TECHNIQUE: Multidetector CT imaging of the abdomen and pelvis was performed using the standard protocol following bolus administration of intravenous contrast.  CONTRAST:  131mL OMNIPAQUE IOHEXOL 300 MG/ML  SOLN   COMPARISON:  11/25/2014  FINDINGS: Lower chest: There is a new right-sided pleural effusion with overlying atelectasis. There is also a left basilar atelectasis. The heart is normal in size. No pericardial effusion. The distal esophagus is grossly normal.  Hepatobiliary: No focal hepatic lesions or intrahepatic biliary dilatation. There are this subdiaphragmatic abscesses anterior to the liver. The upper abscess measures a maximum 5 cm in the lower abscess measures a maximum of 7 cm. No intrahepatic abscess. There is inflammatory phlegmon in the right upper quadrant near the patient's prior gastric bypass surgery. I do not see any definite leaking oral contrast but I assume is a perforation that is sealed off. There is fluid between the stomach and the liver.  Pancreas: No mass, inflammation or ductal dilatation.  Spleen: No focal lesions.  Normal size.  Adrenals/Urinary Tract: Stable scarring changes involving the upper pole region of left kidney. The adrenal glands and right kidney are normal.  Stomach/Bowel: There is fairly marked wall thickening involving the residual stomach suggesting gastritis or peptic ulcer disease. A perforated peptic ulcer is possible but the jejunal loop of small bowel at the anastomosis appears normal. No findings for small bowel obstruction. No free air. The colon is unremarkable except for moderate stool.  Vascular/Lymphatic: Stable aortic calcifications. The branch vessels are patent. The major venous structures are patent. Probable inflammatory/hyperplastic right upper quadrant lymph nodes. No retroperitoneal adenopathy.  Other: The uterus and ovaries are normal. The bladder is normal. No pelvic mass. There is a small amount of free pelvic fluid. No inguinal mass or adenopathy.  Musculoskeletal: No significant bony findings.  IMPRESSION: 1. Persistent inflammatory phlegmon involving the space between the liver and the stomach. Interval development of perihepatic abscesses as  described above. This could be due to anastomotic breakdown along the hepatic margin of the stomach which has sealed off leak or perforated peptic ulcer. The stomach wall is markedly thickened. No obvious leaking oral contrast. 2. New right pleural effusion and overlying atelectasis.   Electronically Signed   By: Marijo Sanes M.D.   On: 12/01/2014 19:28   Dg Duanne Limerick W/water Sol Cm  12/03/2014   CLINICAL DATA:  Abscesses around the stomach and liver. History of peptic ulcer disease and distal antrectomy with gastrojejunostomy. Concern for anastomotic leak.  EXAM: WATER SOLUBLE UPPER GI SERIES  TECHNIQUE: Single-column upper GI series was performed using water soluble contrast.  CONTRAST:  36mL OMNIPAQUE IOHEXOL 300 MG/ML  SOLN  COMPARISON:  CT 12/01/2014.  FLUOROSCOPY TIME:  Radiation Exposure Index (as provided by the fluoroscopic device):  If the device does not provide the exposure index:  Fluoroscopy Time (in minutes and seconds):  1 minutes 24 seconds  Number of Acquired Images:  18  FINDINGS: No esophageal stricture. No evidence of leak from the stomach. There is mild fold thickening in the distal stomach suggesting gastritis. There is prompt emptying of the stomach into the jejunum. No anastomotic leak. No evidence of small bowel obstruction.  IMPRESSION: No evidence of anastomotic leak or obstruction. Mild fold thickening in the distal stomach likely reflects gastritis.   Electronically Signed   By: Rolm Baptise M.D.  On: 12/03/2014 12:53   [place] Image Guided Drainage By Percutaneous Catheter  12/02/2014   CLINICAL DATA:  59 year old female with a history of prior gastric bypass procedure with subsequent postoperative fluid collection in the upper abdomen. She has been referred for drainage.  EXAM: ULTRASOUND GUIDED DRAINAGE OF RIGHT UPPER QUADRANT FLUID COLLECTION  MEDICATIONS: 2.0 mg IV Versed; 200 mcg IV Fentanyl  Total Moderate Sedation Time: 20  PROCEDURE: The procedure, risks, benefits, and  alternatives were explained to the patient. Questions regarding the procedure were encouraged and answered. The patient understands and consents to the procedure.  Patient is position supine position on a stretcher. Ultrasound survey of the upper abdomen was performed with images stored and sent to PACs.  The right upper quadrant was prepped with chlorhexidine in a sterile fashion, and a sterile drape was applied covering the operative field. A sterile gown and sterile gloves were used for the procedure. Local anesthesia was provided with 1% Lidocaine.  Using ultrasound guidance the skin and subcutaneous tissues were generously infiltrated with 1% lidocaine to the level of the abdominal wall.  Using ultrasound imaging a 10 French drain was advanced into the fluid collection using a trocar technique.  Once the drain was in place approximately 90 cc of bilious fluid was aspirated. A sample sent to the lab.  Retention suture was placed in the drain was placed to gravity bag.  Patient tolerated the procedure well and remained hemodynamically stable throughout.  No complications encountered and no significant blood loss encounter.  COMPLICATIONS: None.  FINDINGS: Ultrasound images demonstrate fluid collection overlying the liver.  90 cc of bilious fluid was drained from the fluid collection.  IMPRESSION: Status post ultrasound-guided drain placement into right upper quadrant fluid collection with 90 cc of bilious fluid aspirated.  Signed,  Dulcy Fanny. Earleen Newport, DO  [facility]  [facility]   Electronically Signed   By: Corrie Mckusick D.O.   On: 12/02/2014 18:08    Labs:  CBC:  Recent Labs  12/02/14 0415 12/03/14 0422 12/04/14 0402 12/05/14 0751  WBC 13.4* 20.7* 15.4* 14.9*  HGB 10.9* 10.6* 10.0* 10.3*  HCT 32.5* 31.7* 30.6* 30.8*  PLT 309 234 239 258    COAGS:  Recent Labs  12/02/14 0759  INR 1.20  APTT 28    BMP:  Recent Labs  11/25/14 0948  11/30/14 0116 11/30/14 1043 12/01/14 0522 12/03/14 0422  NA 139 140  --  136 135  K 4.3 4.1  --  4.6 3.6  CL 105 103  --  102 103  CO2 25 27  --  27 24  GLUCOSE 102* 160*  --  148* 126*  BUN 7 16  --  7 9  CALCIUM 9.1 9.2  --  9.0 7.9*  CREATININE 0.55 0.72 0.60 0.67 0.68  GFRNONAA >60 >60 >60 >60 >60  GFRAA >60 >60 >60 >60 >60    LIVER FUNCTION TESTS:  Recent Labs  09/13/14 1623 11/25/14 0948 11/30/14 0116 12/01/14 0522 12/03/14 1219  BILITOT 0.3 0.4 0.3 0.4 0.9  AST 20 39 48* 19  --   ALT 17 48 59* 36  --   ALKPHOS 100 114 131* 95  --   PROT 7.2 6.9 7.2 6.6  --   ALBUMIN 3.5 3.4* 3.5 2.8*  --     Assessment and Plan: History of PUD and distal antrectomy and gastrojejunostomy  RUQ abdominal abscess S/p perc drain 12/02/14 with 50 cc/24 hrs, Cx multi-organisms/pending, wbc  trending down, afebrile S/p UGI 9/24 without evidence for anastomotic leak, drain output Bilirubin and Amylase sent/prosessing  Plan for drain injection in IR to evaluate for fistula  Plans per CCS   Signed: Tsosie Billing D 12/05/2014, 1:08 PM   I spent a total of 15 Minutes at the the patient's bedside AND on the patient's hospital floor or unit, greater than 50% of which was counseling/coordinating care for abdominal abscess.

## 2014-12-06 ENCOUNTER — Other Ambulatory Visit: Payer: Self-pay | Admitting: Radiology

## 2014-12-06 DIAGNOSIS — K651 Peritoneal abscess: Principal | ICD-10-CM

## 2014-12-06 DIAGNOSIS — T814XXD Infection following a procedure, subsequent encounter: Principal | ICD-10-CM

## 2014-12-06 DIAGNOSIS — E43 Unspecified severe protein-calorie malnutrition: Secondary | ICD-10-CM

## 2014-12-06 DIAGNOSIS — IMO0001 Reserved for inherently not codable concepts without codable children: Secondary | ICD-10-CM

## 2014-12-06 DIAGNOSIS — J948 Other specified pleural conditions: Secondary | ICD-10-CM

## 2014-12-06 DIAGNOSIS — J90 Pleural effusion, not elsewhere classified: Secondary | ICD-10-CM

## 2014-12-06 LAB — PH, BODY FLUID: pH, Body Fluid: 7.8

## 2014-12-06 LAB — GASTRIN: GASTRIN: 14 pg/mL (ref 0–115)

## 2014-12-06 LAB — CBC
HEMATOCRIT: 30 % — AB (ref 36.0–46.0)
HEMOGLOBIN: 10.1 g/dL — AB (ref 12.0–15.0)
MCH: 27.5 pg (ref 26.0–34.0)
MCHC: 33.7 g/dL (ref 30.0–36.0)
MCV: 81.7 fL (ref 78.0–100.0)
Platelets: 279 10*3/uL (ref 150–400)
RBC: 3.67 MIL/uL — ABNORMAL LOW (ref 3.87–5.11)
RDW: 15.3 % (ref 11.5–15.5)
WBC: 14.9 10*3/uL — ABNORMAL HIGH (ref 4.0–10.5)

## 2014-12-06 LAB — COMPREHENSIVE METABOLIC PANEL
ALK PHOS: 213 U/L — AB (ref 38–126)
ALT: 134 U/L — AB (ref 14–54)
AST: 112 U/L — AB (ref 15–41)
Albumin: 1.9 g/dL — ABNORMAL LOW (ref 3.5–5.0)
Anion gap: 8 (ref 5–15)
CALCIUM: 8 mg/dL — AB (ref 8.9–10.3)
CO2: 30 mmol/L (ref 22–32)
CREATININE: 0.53 mg/dL (ref 0.44–1.00)
Chloride: 98 mmol/L — ABNORMAL LOW (ref 101–111)
Glucose, Bld: 113 mg/dL — ABNORMAL HIGH (ref 65–99)
Potassium: 2.8 mmol/L — ABNORMAL LOW (ref 3.5–5.1)
Sodium: 136 mmol/L (ref 135–145)
Total Bilirubin: 0.7 mg/dL (ref 0.3–1.2)
Total Protein: 5.6 g/dL — ABNORMAL LOW (ref 6.5–8.1)

## 2014-12-06 LAB — CULTURE, ROUTINE-ABSCESS: Gram Stain: NONE SEEN

## 2014-12-06 LAB — PATHOLOGIST SMEAR REVIEW

## 2014-12-06 LAB — LIPASE, BLOOD: LIPASE: 24 U/L (ref 22–51)

## 2014-12-06 LAB — PREALBUMIN: Prealbumin: 3.7 mg/dL — ABNORMAL LOW (ref 18–38)

## 2014-12-06 LAB — MAGNESIUM: MAGNESIUM: 2 mg/dL (ref 1.7–2.4)

## 2014-12-06 MED ORDER — FLUCONAZOLE 100 MG PO TABS
200.0000 mg | ORAL_TABLET | Freq: Every day | ORAL | Status: AC
  Administered 2014-12-06 – 2014-12-08 (×3): 200 mg via ORAL
  Filled 2014-12-06 (×3): qty 2

## 2014-12-06 MED ORDER — POTASSIUM CHLORIDE CRYS ER 20 MEQ PO TBCR
40.0000 meq | EXTENDED_RELEASE_TABLET | ORAL | Status: AC
  Administered 2014-12-06 (×2): 40 meq via ORAL
  Filled 2014-12-06 (×3): qty 2

## 2014-12-06 NOTE — Progress Notes (Signed)
[facility] Progress Note   Shelly Hood  KXF:[id number]  DOB: Apr 04, 1955  DOA: 11/30/2014 PCP: Viviana Simpler, MD  Brief narrative: Shelly Hood is a 59 y.o. 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 The patient has had C. difficile colitis since April 20 16th. 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 Diarrhea recurred and she was placed on vancomycin on 8/22. She required a second course of vancomycin that was started on 9/14. She presented to the hospital with right upper quadrant pain. She states that she had had ongoing diarrhea prior to presentation to the hospital. Imaging revealed right upper quadrant fluid collection. Right upper quadrant drain placed by IR. The patient continued to have fevers subsequent to this. On admission she had a small right-sided pleural effusion. It was noted that she was slowly becoming more hypoxic and had decreased breath sounds in right middle lung field and therefore a repeat x-ray was done which revealed a now moderate sized right sided pleural effusion. IR guided thoracentesis performed on 9/26.   Subjective: No complaints of cough or shortness of breath. Has some mild soreness in the right upper quadrant. Shelly Hood is improving with nystatin. Tolerating solid and liquid diet without nausea. However, had numerous loose stools yesterday which she suspects were from Ensure.  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 Johney Maine on 06/24/14,  -Question of whether she may have had a gastric perforation or leaking anastomosis which resulted in abscesses-  Upper GI with Gastrograffin does not reveal a leak -Drainage catheter injected 9/26 to look for leak-no fistulous connection to bowel sound- - Surgery following-diet has been advanced  Acute hypoxic respiratory failure-right-sided pleural effusion -9/26 Thoracentesis of moderate right-sided pleural effusion-possibly reactive from above-mentioned abscesses -Fluid is exudative-pulmonary consulted for their opinion on whether this is reactive or infectious-they feel it is reactive as well-we'll need to continue to monitor for re-collection -2-D echo ordered to determine if she has underlying heart failure- this reveals that she has a normal EF and grade 1 diastolic dysfunction. -Continue to wean oxygen as able    Elevated LFTs -Possibly in relation to sepsis-improved   Anorexia -Have ordered resource boost and ensure  Depression/anxiety -Continue Prozac and Xanax  Thrush  - improving with nystatin-Diflucan added by surgery today  Hypokalemia -Replacing -rechecked tomorrow magnesium level is normal-   Code Status:     Code Status Orders        Start     Ordered   11/30/14 0939  Full code   Continuous     11/30/14 0938    Advance Directive Documentation        Most Recent Value   Type of Advance Directive  Mental Health Advance Directive   Pre-existing out of facility DNR order (yellow form or pink MOST form)     "  MOST" Form in Place?       Family Communication: Daughter and husband DVT prophylaxis: Lovenox Consultants: GI, ID, gen surgery, IR, pulmonary  Procedures: 9/23 Right upper quadrant  drain by IR 9/26 right Sided thoracentesis by IR  2-D echo Left ventricle: The cavity size was normal. Systolic function was normal. Wall motion was normal; there were no regional wall motion abnormalities. Doppler parameters are consistent with abnormal left ventricular relaxation (grade 1 diastolic dysfunction). Doppler parameters are consistent with elevated ventricular end-diastolic filling pressure. - Aortic valve: Trileaflet; normal thickness leaflets. There was no regurgitation. - Aortic root: The aortic root was normal in size. - Left atrium: The atrium was normal in size. - Right ventricle: The cavity size was normal. Wall thickness was normal. Systolic function was normal. - Right atrium: The atrium was normal in size. - Tricuspid valve: There was moderate regurgitation. - Pulmonic valve: There was no regurgitation. - Pulmonary arteries: The main pulmonary artery was normal-sized. Systolic pressure was moderately increased. PA peak pressure: 51 mm Hg (S). - Inferior vena cava: The vessel was normal in size. - Pericardium, extracardiac: There was no pericardial effusion.  Antibiotics: Anti-infectives    Start     Dose/Rate Route Frequency Ordered Stop   12/06/14 0845  fluconazole (DIFLUCAN) tablet 200 mg     200 mg Oral Daily 12/06/14 0841 12/09/14 0959   12/01/14 2230  piperacillin-tazobactam (ZOSYN) IVPB 3.375 g     3.375 g 12.5 mL/hr over 240 Minutes Intravenous 3 times per day 12/01/14 2138     12/01/14 1315  fidaxomicin (DIFICID) tablet 200 mg     200 mg Oral 2 times daily 12/01/14 1304     11/30/14 1500  metroNIDAZOLE (FLAGYL) IVPB 500 mg  Status:  Discontinued     500 mg 100 mL/hr over 60 Minutes Intravenous Every 8 hours 11/30/14 0939 11/30/14 1400   11/30/14 1315  vancomycin (VANCOCIN) 50 mg/mL oral solution 500 mg  Status:  Discontinued     500 mg Oral 4 times per day 11/30/14 1312 11/30/14 1312   11/30/14 1230  fidaxomicin (DIFICID) tablet  200 mg  Status:  Discontinued     200 mg Oral 2 times daily 11/30/14 1227 12/01/14 1304   11/30/14 0630  metroNIDAZOLE (FLAGYL) IVPB 500 mg     500 mg 100 mL/hr over 60 Minutes Intravenous  Once 11/30/14 0626 11/30/14 0756      Objective: Filed Weights   11/30/14 0105  Weight: 63.192 kg (139 lb 5 oz)    Intake/Output Summary (Last 24 hours) at 12/06/14 1703 Last data filed at 12/06/14 7096  Gross per 24 hour  Intake     15 ml  Output    825 ml  Net   -810 ml     Vitals Filed Vitals:   12/05/14 1654 12/05/14 2217 12/06/14 0522 12/06/14 1500  BP:  108/50 116/58 123/66  Pulse:  70 67 77  Temp: 100 F (37.8 C) 98.2 F (36.8 C) 98.2 F (36.8 C) 99.5 F (37.5 C)  TempSrc: Oral Oral Oral Oral  Resp:  18 17 18   Height:      Weight:      SpO2:  96% 93% 98%    Exam:  General:  Pt is alert, not in acute distress  HEENT: No icterus, No thrush, oral mucosa moist  Cardiovascular: regular rate and rhythm, S1/S2 No murmur  Respiratory: clear to auscultation bilaterally - decreased breath sounds in right middle and lower lung field  Abdomen: Soft, +Bowel sounds, tender in right upper quadrant, drain with bilious drainage, non distended, no guarding  MSK: No LE edema, cyanosis or clubbing  Data Reviewed: Basic Metabolic Panel:  Recent Labs Lab 11/30/14 0116 11/30/14 1043 12/01/14 0522 12/03/14 0422 12/05/14 1710 12/06/14 0024  NA 140  --  136 135 135 136  K 4.1  --  4.6 3.6 2.8* 2.8*  CL 103  --  102 103 98* 98*  CO2 27  --  27 24 27 30   GLUCOSE 160*  --  148* 126* 105* 113*  BUN 16  --  7 9 <5* <5*  CREATININE 0.72 0.60 0.67 0.68 0.54 0.53  CALCIUM 9.2  --  9.0 7.9* 7.8* 8.0*  MG  --   --   --   --   --  2.0   Liver Function Tests:  Recent Labs Lab 11/30/14 0116 12/01/14 0522 12/03/14 1219 12/05/14 1710 12/06/14 0024  AST 48* 19  --  186* 112*  ALT 59* 36  --  154* 134*  ALKPHOS 131* 95  --  226* 213*  BILITOT 0.3 0.4 0.9 0.5 0.7  PROT 7.2 6.6  --   5.5* 5.6*  ALBUMIN 3.5 2.8*  --  1.8* 1.9*    Recent Labs Lab 11/30/14 0116 12/03/14 1219 12/06/14 0024  LIPASE 45  --  24  AMYLASE  --  53  --    No results for input(s): AMMONIA in the last 168 hours. CBC:  Recent Labs Lab 12/02/14 0415 12/03/14 0422 12/04/14 0402 12/05/14 0751 12/06/14 0024  WBC 13.4* 20.7* 15.4* 14.9* 14.9*  HGB 10.9* 10.6* 10.0* 10.3* 10.1*  HCT 32.5* 31.7* 30.6* 30.8* 30.0*  MCV 83.1 83.0 83.8 82.4 81.7  PLT 309 234 239 258 279   Cardiac Enzymes: No results for input(s): CKTOTAL, CKMB, CKMBINDEX, TROPONINI in the last 168 hours. BNP (last 3 results) No results for input(s): BNP in the last 8760 hours.  ProBNP (last 3 results) No results for input(s): PROBNP in the last 8760 hours.  CBG: No results for input(s): GLUCAP in the last 168 hours.  Recent Results (from the past 240 hour(s))  Culture, routine-abscess     Status: None   Collection Time: 12/02/14  6:32 PM  Result Value Ref Range Status   Specimen Description ABSCESS ABDOMEN  Final   Special Requests NONE  Final   Gram Stain   Final    NO WBC SEEN NO SQUAMOUS EPITHELIAL CELLS SEEN NO ORGANISMS SEEN Performed at [facility]    Culture   Final    MULTIPLE ORGANISMS PRESENT, NONE PREDOMINANT Note: NO STAPHYLOCOCCUS AUREUS ISOLATED NO GROUP A STREP (S.PYOGENES) ISOLATED Performed at [facility]    Report Status 12/06/2014 FINAL  Final  Culture, body fluid-bottle     Status: None (Preliminary result)   Collection Time: 12/05/14 11:15 AM  Result Value Ref Range Status   Specimen Description FLUID RIGHT PLEURAL  Final   Special Requests BAA 10CCS  Final   Culture NO GROWTH 1 DAY  Final   Report Status PENDING  Incomplete  Gram stain     Status: None   Collection Time: 12/05/14 11:15 AM  Result Value Ref Range Status   Specimen Description FLUID RIGHT PLEURAL  Final   Special Requests NONE  Final   Gram Stain   Final    MODERATE WBC PRESENT,BOTH PMN AND  MONONUCLEAR NO ORGANISMS SEEN    Report Status 12/05/2014 FINAL  Final  Studies: Dg Chest 1 View  12/05/2014   CLINICAL DATA:  Status post right-sided thoracentesis.  EXAM: CHEST 1 VIEW  COMPARISON:  December 04, 2014.  FINDINGS: Stable cardiomediastinal silhouette. No pneumothorax is noted. Right pleural effusion noted on prior exam is significantly smaller status post thoracentesis, although mild residual effusion remains. Elevated right hemidiaphragm is noted as well. Stable interstitial densities are noted in the left lung concerning for pulmonary edema. Bony thorax is unremarkable.  IMPRESSION: No pneumothorax status post right-sided thoracentesis. Right pleural effusion is significantly smaller compared to prior exam. Stable interstitial densities noted in left lung concerning for pulmonary edema.   Electronically Signed   By: Marijo Conception, M.D.   On: 12/05/2014 11:56   Dg Chest 2 View  12/04/2014   CLINICAL DATA:  Hypoxia.  Right upper quadrant pain.  EXAM: CHEST  2 VIEW  COMPARISON:  Included lung bases from CT abdomen/ pelvis 12/01/2014  FINDINGS: Moderate right pleural effusion with adjacent basilar airspace disease, appears increased from prior CT. Minimal blunting of left costophrenic angle, likely small left pleural effusion. There is no pulmonary edema. Heart size appears normal, mediastinal contours partially obscured. No pneumothorax. No acute osseous abnormalities are seen. A drain is present in the right upper quadrant of the abdomen.  IMPRESSION: Increased size of right pleural effusion, now moderate in degree. Associated basilar airspace disease, likely compressive atelectasis. Small left pleural effusion.   Electronically Signed   By: Jeb Levering M.D.   On: 12/04/2014 18:51   Ir Sinus/fist Tube Chk-non Gi  12/05/2014   CLINICAL DATA:  59 year old female with a history of anterior abdominal fluid collection of the right upper quadrant.  Ultrasound-guided drainage was  performed 12/02/2014.  EXAM: DRAINAGE CATHETER INJECTION  TECHNIQUE: Indwelling catheter was injected under fluoroscopy.  FLUOROSCOPY TIME:  12 seconds  COMPARISON:  CT 12/01/2014, ultrasound 12/02/2014  FINDINGS: Patient is position supine position on the fluoroscopy table in the Interventional Radiology suite.  Small amount of contrast was infused through the indwelling catheter in the right upper quadrant.  No significant cavity size was present surrounding the pigtail catheter drain. No fistulous connection to bowel identified.  IMPRESSION: Injection of catheter in the right upper quadrant demonstrates no fistulous connection to bowel. No significant abscess cavity remains.  Signed,  Dulcy Fanny. Earleen Newport DO  [facility]  [facility]  PLAN: Patient should maintain a log of al put daily after discharge.  Would refer to [facility] drain clinic in 1-2 weeks after discharge for a drain check.   Electronically Signed   By: Corrie Mckusick D.O.   On: 12/05/2014 17:42   US Thoracentesis Asp Pleural Space W/img Guide  12/05/2014   INDICATION: Symptomatic right sided pleural effusion  EXAM: US THORACENTESIS ASP PLEURAL SPACE W/IMG GUIDE  COMPARISON:  CXR 12/04/2014.  MEDICATIONS: None  COMPLICATIONS: None immediate  TECHNIQUE: Informed written consent was obtained from the patient after a discussion of the risks, benefits and alternatives to treatment. A timeout was performed prior to the initiation of the procedure.  Initial ultrasound scanning demonstrates a right pleural effusion. The lower chest was prepped and draped in the usual sterile fashion. 1% lidocaine was used for local anesthesia.  Under direct ultrasound guidance, a 19 gauge, 7-cm, Yueh catheter was introduced. An ultrasound image was saved for documentation purposes. The thoracentesis was performed. The catheter was removed and a dressing was applied. The patient tolerated the procedure well without  immediate post procedural  complication. The patient was escorted to have an upright chest radiograph.  FINDINGS: A total of approximately 700 ml of serous fluid was removed. Requested samples were sent to the laboratory.  IMPRESSION: Successful ultrasound-guided right sided thoracentesis yielding 700 ml of pleural fluid.  Read By:  Tsosie Billing PA-C   Electronically Signed   By: Corrie Mckusick D.O.   On: 12/05/2014 13:08    Scheduled Meds:  Scheduled Meds: . ALPRAZolam  0.25 mg Oral Daily  . antiseptic oral rinse  7 mL Mouth Rinse BID  . atorvastatin  20 mg Oral q1800  . famotidine  20 mg Oral BID  . feeding supplement (GLUCERNA SHAKE)  237 mL Oral BID BM  . fidaxomicin  200 mg Oral BID  . fluconazole  200 mg Oral Daily  . FLUoxetine  40 mg Oral BID  . lip balm   Topical BID  . multivitamins with iron  1 tablet Oral Daily  . nystatin  5 mL Oral QID  . piperacillin-tazobactam (ZOSYN)  IV  3.375 g Intravenous 3 times per day  . protein supplement  2 oz Oral BID  . saccharomyces boulardii  250 mg Oral BID  . vitamin C  500 mg Oral BID   Continuous Infusions:    Time spent on care of this patient: 67 min   [place], MD 12/06/2014, 5:03 PM  LOS: 6 days   [facility] Office  [phone] Pager - Text Page per [email or web address] If 7PM-7AM, please contact night-coverage [email or web address]

## 2014-12-06 NOTE — Progress Notes (Addendum)
[facility]  [address] [place], [place] [address] Phone: [phone] FAX: [phone]   SHYAN SCALISI [id number] November 30, 1955   Problem List:   Principal Problem:   Abscess, abdomen Active Problems:   Anxiety disorder   GERD   Pyloric stricture s/p vagotomy/antrectomy/Roux-enY 06/24/2014   Clostridium difficile colitis   Leukocytosis   Elevated LFTs   RUQ pain   Peptic ulcer disease   Elevated WBC count   Fever   Gastritis   Protein-calorie malnutrition, moderate   C. difficile colitis      * No surgery found *    Assessment  RUQ collection ? abscess w/o definite leak - improving s/p perc drainage & IV Abx.  Cxs negative so far  Gastritis  Hypoxia w effusion - improved after thoracentesis  SEVERE MALNUTRITION  Plan:  -drain output with normal bilirubin & amylase = not c/w leak -UGI not c/w leak -green output concerning for leak -I suspect that she had a delayed duodenal bulb leak that is closed right now.  -adv diet.  Try supp shakes although dumping risk (might be good to see if she is at risk for dumping with shake challenge while an inpatient.  -postgastrectomy diet training reinforcement  -keep drain & do drain study in 5 days from drain placement (= 9/28 Wed) +/- Ct scan to r/o fistula to GJ/stomach (should've been seen on UGI) or to duodenal stump (which would not be seen with UGI).  Apparently radiology decided to drain study yesterday anyway.  Hasn't long narrow tract does not definitely connected to the digestive tract.  Discuss with interventional PA Tsosie Billing.  Rec have outpatient fistula study in 2 weeks at the interventional radiology outpatient drain clinic.  If no fistula, no significant cavity,&  output less than 30 mL a day; then, Remove the drain in outpatient clinic.  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  H2B not as good but better  than nothing I guess  -check gastrin level w recurrent gastrits after V&Antrectomy - pending  -consider EGD to eval gastritis, r/o stricture if no fistula confirmed on drain study Wed.  GI signed off again - see if we can get them back.  -? Pt c/o thrush / odynophagia per pt - give fluc x 3 d (ID / IM can overrule)  -no good evid of colitis on CT scan = ?CDiff colonization & not colitis ?? - defer to ID.  Cont fidaxomicin  -chronic diarrhea for years = reconsider etiology as not just C Diff.  Diarrhea w Ensure = Teach anti dumping diet  -cont ABx - f/u cultures.  WBC flat & pain less so stick w pip/tazo only if ID agrees.   If cultures negative stop after 5 days from abd collection standpoint.    -thoracentesis for new RLL effusion ? Check Cx?    [name], M.D., F.A.C.S. Gastrointestinal and Minimally Invasive [facility] [address] [place],  [address] [phone] Main / Paging   12/06/2014  Subjective:  Pain less Breathing better s/p 728m drained thoracentesis N/D with ensure - switching to glucerna No V/bloating/emesis  Objective:  Vital signs:  Filed Vitals:   12/05/14 1300 12/05/14 1654 12/05/14 2217 12/06/14 0522  BP: 134/62  108/50 116/58  Pulse: 68  70 67  Temp: 98.6 F (37 C) 100 F (37.8 C) 98.2 F (36.8 C) 98.2 F (36.8  C)  TempSrc: Oral Oral Oral Oral  Resp: _0 Height:      Weight:      SpO2: 96%  96% 93%    Last BM Date: 12/05/14  Intake/Output   Yesterday:  09/26 0701 - 09/27 0700 In: 470 [P.O.:460] Out: 1325 [Urine:1200; Drains:125] This shift:     Bowel function:  Flatus: y  BM: y  Drain: serobilious  Physical Exam:  General: Pt awake/alert/oriented x4 in no acute distress Eyes: PERRL, normal EOM.  Sclera clear.  No icterus Neuro: CN II-XII intact w/o focal sensory/motor deficits. Lymph: No head/neck/groin lymphadenopathy Psych:  No  delerium/psychosis/paranoia HENT: Normocephalic, Mucus membranes moist.  No thrush Neck: Supple, No tracheal deviation Chest: No chest wall pain w good excursion.  CTA B - breathing more easily CV:  Pulses intact.  Regular rhythm MS: Normal AROM mjr joints.  No obvious deformity Abdomen: Soft.  Nondistended.  Mildly tender at drain site only.  No evidence of peritonitis.  No incarcerated hernias. Ext:  SCDs BLE.  No mjr edema.  No cyanosis Skin: No petechiae / purpura  Results:   Labs: Results for orders placed or performed during the hospital encounter of 11/30/14 (from the past 48 hour(s))  CBC     Status: Abnormal   Collection Time: 12/05/14  7:51 AM  Result Value Ref Range   WBC 14.9 (H) 4.0 - 10.5 K/uL   RBC 3.74 (L) 3.87 - 5.11 MIL/uL   Hemoglobin 10.3 (L) 12.0 - 15.0 g/dL   HCT 30.8 (L) 36.0 - 46.0 %   MCV 82.4 78.0 - 100.0 fL   MCH 27.5 26.0 - 34.0 pg   MCHC 33.4 30.0 - 36.0 g/dL   RDW 15.4 11.5 - 15.5 %   Platelets 258 150 - 400 K/uL  Lactate dehydrogenase (CSF, pleural or peritoneal fluid)     Status: Abnormal   Collection Time: 12/05/14 11:15 AM  Result Value Ref Range   LD, Fluid 127 (H) 3 - 23 U/L    Comment: (NOTE) Results should be evaluated in conjunction with serum values    Fluid Type-FLDH Pleural R   Body fluid cell count with differential     Status: Abnormal   Collection Time: 12/05/14 11:15 AM  Result Value Ref Range   Fluid Type-FCT Pleural R    Color, Fluid YELLOW (A) YELLOW   Appearance, Fluid CLOUDY (A) CLEAR   WBC, Fluid 4140 (H) 0 - 1000 cu mm   Neutrophil Count, Fluid 71 (H) 0 - 25 %   Lymphs, Fluid 16 %   Monocyte-Macrophage-Serous Fluid 13 (L) 50 - 90 %   Other Cells, Fluid MESOTHELIAL CELLS PRESENT %  Glucose, pleural or peritoneal fluid     Status: None   Collection Time: 12/05/14 11:15 AM  Result Value Ref Range   Glucose, Fluid 107 mg/dL    Comment: (NOTE) No normal range established for this test Results should be evaluated in  conjunction with serum values    Fluid Type-FGLU Pleural R   Culture, body fluid-bottle     Status: None (Preliminary result)   Collection Time: 12/05/14 11:15 AM  Result Value Ref Range   Specimen Description FLUID RIGHT PLEURAL    Special Requests BAA 10CCS    Culture PENDING    Report Status PENDING   Gram stain     Status: None   Collection Time: 12/05/14 11:15 AM  Result Value Ref Range   Specimen Description FLUID RIGHT PLEURAL  Special Requests NONE    Gram Stain      MODERATE WBC PRESENT,BOTH PMN AND MONONUCLEAR NO ORGANISMS SEEN    Report Status 12/05/2014 FINAL   Lactate dehydrogenase     Status: Abnormal   Collection Time: 12/05/14  5:10 PM  Result Value Ref Range   LDH 262 (H) 98 - 192 U/L  Comprehensive metabolic panel     Status: Abnormal   Collection Time: 12/05/14  5:10 PM  Result Value Ref Range   Sodium 135 135 - 145 mmol/L   Potassium 2.8 (L) 3.5 - 5.1 mmol/L   Chloride 98 (L) 101 - 111 mmol/L   CO2 27 22 - 32 mmol/L   Glucose, Bld 105 (H) 65 - 99 mg/dL   BUN <5 (L) 6 - 20 mg/dL   Creatinine, Ser 0.54 0.44 - 1.00 mg/dL   Calcium 7.8 (L) 8.9 - 10.3 mg/dL   Total Protein 5.5 (L) 6.5 - 8.1 g/dL   Albumin 1.8 (L) 3.5 - 5.0 g/dL   AST 186 (H) 15 - 41 U/L   ALT 154 (H) 14 - 54 U/L   Alkaline Phosphatase 226 (H) 38 - 126 U/L   Total Bilirubin 0.5 0.3 - 1.2 mg/dL   GFR calc non Af Amer >60 >60 mL/min   GFR calc Af Amer >60 >60 mL/min    Comment: (NOTE) The eGFR has been calculated using the CKD EPI equation. This calculation has not been validated in all clinical situations. eGFR's persistently <60 mL/min signify possible Chronic Kidney Disease.    Anion gap 10 5 - 15  Prealbumin     Status: Abnormal   Collection Time: 12/06/14 12:24 AM  Result Value Ref Range   Prealbumin 3.7 (L) 18 - 38 mg/dL  Comprehensive metabolic panel     Status: Abnormal   Collection Time: 12/06/14 12:24 AM  Result Value Ref Range   Sodium 136 135 - 145 mmol/L   Potassium  2.8 (L) 3.5 - 5.1 mmol/L   Chloride 98 (L) 101 - 111 mmol/L   CO2 30 22 - 32 mmol/L   Glucose, Bld 113 (H) 65 - 99 mg/dL   BUN <5 (L) 6 - 20 mg/dL   Creatinine, Ser 0.53 0.44 - 1.00 mg/dL   Calcium 8.0 (L) 8.9 - 10.3 mg/dL   Total Protein 5.6 (L) 6.5 - 8.1 g/dL   Albumin 1.9 (L) 3.5 - 5.0 g/dL   AST 112 (H) 15 - 41 U/L   ALT 134 (H) 14 - 54 U/L   Alkaline Phosphatase 213 (H) 38 - 126 U/L   Total Bilirubin 0.7 0.3 - 1.2 mg/dL   GFR calc non Af Amer >60 >60 mL/min   GFR calc Af Amer >60 >60 mL/min    Comment: (NOTE) The eGFR has been calculated using the CKD EPI equation. This calculation has not been validated in all clinical situations. eGFR's persistently <60 mL/min signify possible Chronic Kidney Disease.    Anion gap 8 5 - 15  Lipase, blood     Status: None   Collection Time: 12/06/14 12:24 AM  Result Value Ref Range   Lipase 24 22 - 51 U/L  Magnesium     Status: None   Collection Time: 12/06/14 12:24 AM  Result Value Ref Range   Magnesium 2.0 1.7 - 2.4 mg/dL  CBC     Status: Abnormal   Collection Time: 12/06/14 12:24 AM  Result Value Ref Range   WBC 14.9 (H) 4.0 - 10.5 K/uL  RBC 3.67 (L) 3.87 - 5.11 MIL/uL   Hemoglobin 10.1 (L) 12.0 - 15.0 g/dL   HCT 30.0 (L) 36.0 - 46.0 %   MCV 81.7 78.0 - 100.0 fL   MCH 27.5 26.0 - 34.0 pg   MCHC 33.7 30.0 - 36.0 g/dL   RDW 15.3 11.5 - 15.5 %   Platelets 279 150 - 400 K/uL    Imaging / Studies: Dg Chest 1 View  12/05/2014   CLINICAL DATA:  Status post right-sided thoracentesis.  EXAM: CHEST 1 VIEW  COMPARISON:  December 04, 2014.  FINDINGS: Stable cardiomediastinal silhouette. No pneumothorax is noted. Right pleural effusion noted on prior exam is significantly smaller status post thoracentesis, although mild residual effusion remains. Elevated right hemidiaphragm is noted as well. Stable interstitial densities are noted in the left lung concerning for pulmonary edema. Bony thorax is unremarkable.  IMPRESSION: No pneumothorax  status post right-sided thoracentesis. Right pleural effusion is significantly smaller compared to prior exam. Stable interstitial densities noted in left lung concerning for pulmonary edema.   Electronically Signed   By: Marijo Conception, M.D.   On: 12/05/2014 11:56   Dg Chest 2 View  12/04/2014   CLINICAL DATA:  Hypoxia.  Right upper quadrant pain.  EXAM: CHEST  2 VIEW  COMPARISON:  Included lung bases from CT abdomen/ pelvis 12/01/2014  FINDINGS: Moderate right pleural effusion with adjacent basilar airspace disease, appears increased from prior CT. Minimal blunting of left costophrenic angle, likely small left pleural effusion. There is no pulmonary edema. Heart size appears normal, mediastinal contours partially obscured. No pneumothorax. No acute osseous abnormalities are seen. A drain is present in the right upper quadrant of the abdomen.  IMPRESSION: Increased size of right pleural effusion, now moderate in degree. Associated basilar airspace disease, likely compressive atelectasis. Small left pleural effusion.   Electronically Signed   By: Jeb Levering M.D.   On: 12/04/2014 18:51   Ir Sinus/fist Tube Chk-non Gi  12/05/2014   CLINICAL DATA:  59 year old female with a history of anterior abdominal fluid collection of the right upper quadrant.  Ultrasound-guided drainage was performed 12/02/2014.  EXAM: DRAINAGE CATHETER INJECTION  TECHNIQUE: Indwelling catheter was injected under fluoroscopy.  FLUOROSCOPY TIME:  12 seconds  COMPARISON:  CT 12/01/2014, ultrasound 12/02/2014  FINDINGS: Patient is position supine position on the fluoroscopy table in the Interventional Radiology suite.  Small amount of contrast was infused through the indwelling catheter in the right upper quadrant.  No significant cavity size was present surrounding the pigtail catheter drain. No fistulous connection to bowel identified.  IMPRESSION: Injection of catheter in the right upper quadrant demonstrates no fistulous connection  to bowel. No significant abscess cavity remains.  Signed,  Dulcy Fanny. Earleen Newport DO  [facility]  [facility]  PLAN: Patient should maintain a log of al put daily after discharge.  Would refer to [facility] drain clinic in 1-2 weeks after discharge for a drain check.   Electronically Signed   By: Corrie Mckusick D.O.   On: 12/05/2014 17:42   US Thoracentesis Asp Pleural Space W/img Guide  12/05/2014   INDICATION: Symptomatic right sided pleural effusion  EXAM: US THORACENTESIS ASP PLEURAL SPACE W/IMG GUIDE  COMPARISON:  CXR 12/04/2014.  MEDICATIONS: None  COMPLICATIONS: None immediate  TECHNIQUE: Informed written consent was obtained from the patient after a discussion of the risks, benefits and alternatives to treatment. A timeout was performed prior to the initiation of the procedure.  Initial  ultrasound scanning demonstrates a right pleural effusion. The lower chest was prepped and draped in the usual sterile fashion. 1% lidocaine was used for local anesthesia.  Under direct ultrasound guidance, a 19 gauge, 7-cm, Yueh catheter was introduced. An ultrasound image was saved for documentation purposes. The thoracentesis was performed. The catheter was removed and a dressing was applied. The patient tolerated the procedure well without immediate post procedural complication. The patient was escorted to have an upright chest radiograph.  FINDINGS: A total of approximately 700 ml of serous fluid was removed. Requested samples were sent to the laboratory.  IMPRESSION: Successful ultrasound-guided right sided thoracentesis yielding 700 ml of pleural fluid.  Read By:  Tsosie Billing PA-C   Electronically Signed   By: Corrie Mckusick D.O.   On: 12/05/2014 13:08    Medications / Allergies: per chart  Antibiotics: Anti-infectives    Start     Dose/Rate Route Frequency Ordered Stop   12/01/14 2230  piperacillin-tazobactam (ZOSYN) IVPB 3.375 g     3.375 g 12.5 mL/hr  over 240 Minutes Intravenous 3 times per day 12/01/14 2138     12/01/14 1315  fidaxomicin (DIFICID) tablet 200 mg     200 mg Oral 2 times daily 12/01/14 1304     11/30/14 1500  metroNIDAZOLE (FLAGYL) IVPB 500 mg  Status:  Discontinued     500 mg 100 mL/hr over 60 Minutes Intravenous Every 8 hours 11/30/14 0939 11/30/14 1400   11/30/14 1315  vancomycin (VANCOCIN) 50 mg/mL oral solution 500 mg  Status:  Discontinued     500 mg Oral 4 times per day 11/30/14 1312 11/30/14 1312   11/30/14 1230  fidaxomicin (DIFICID) tablet 200 mg  Status:  Discontinued     200 mg Oral 2 times daily 11/30/14 1227 12/01/14 1304   11/30/14 0630  metroNIDAZOLE (FLAGYL) IVPB 500 mg     500 mg 100 mL/hr over 60 Minutes Intravenous  Once 11/30/14 6803 11/30/14 0756        Note: Portions of this report may have been transcribed using voice recognition software. Every effort was made to ensure accuracy; however, inadvertent computerized transcription errors may be present.   Any transcriptional errors that result from this process are unintentional.     [name], M.D., F.A.C.S. Gastrointestinal and Minimally Invasive Surgery [facility] [address], [place], [place] [address] [phone] Main / Paging   12/06/2014  CARE TEAM:  PCP: Viviana Simpler, MD  Outpatient Care Team: Patient Care Team: Venia Carbon, MD as PCP - General Ladene Artist, MD as Consulting Physician (Gastroenterology) Michael Boston, MD as Consulting Physician (General Surgery)  Inpatient Treatment Team: Treatment Team: Attending Provider: Debbe Odea, MD; Attending Physician: Theodis Blaze, MD; Registered Nurse: Suzan Nailer, RN; Registered Nurse: Donzetta Matters, RN; Rounding Team: Minerva Ends, MD; Technician: Francene Finders, NT; Registered Nurse: Roselind Rily, RN; Registered Nurse: Zachery Conch; Consulting Physician: Michael Boston, MD; Technician: Mort Sawyers, NT; Technician: Army Chaco, NT; Technician: Wyline Copas, NT; Registered Nurse: Candida Peeling, RN; Registered Nurse: Gardner Candle, RN; Registered Nurse: Roetta Sessions, RN; Rounding Team: Md Pccm, MD

## 2014-12-06 NOTE — Progress Notes (Signed)
Referring Physician(s): CCS  Chief Complaint: RUQ abscess s/p perc drain 12/02/14  Subjective: Patient denies any significant abdominal pain, just admits to drain site tenderness. She is tolerating oral intake and denies any nausea or vomiting.   Allergies: Cefotetan; Naproxen; Nsaids; Aspirin; Ibuprofen; Toradol; Codeine sulfate; Ensure; and Flagyl  Medications: Prior to Admission medications   Medication Sig Start Date End Date Taking? Authorizing Provider  acetaminophen (TYLENOL) 500 MG tablet Take 1,000 mg by mouth every 6 (six) hours as needed for mild pain or fever (pain and fever).    Yes Historical Provider, MD  ALPRAZolam (XANAX) 0.25 MG tablet Take 0.25 mg by mouth daily.    Yes Historical Provider, MD  atorvastatin (LIPITOR) 20 MG tablet Take 1 tablet (20 mg total) by mouth daily at 6 PM. Patient taking differently: Take 20 mg by mouth daily.  04/27/14  Yes Venia Carbon, MD  FLUoxetine (PROZAC) 40 MG capsule Take 1 capsule (40 mg total) by mouth daily. 04/27/14  Yes Venia Carbon, MD  saccharomyces boulardii (FLORASTOR) 250 MG capsule Take 500 mg by mouth daily.   Yes Historical Provider, MD  vancomycin (VANCOCIN HCL) 125 MG capsule Take 1 capsule (125 mg total) by mouth 4 (four) times daily. 11/23/14  Yes Carlyle Basques, MD  diphenoxylate-atropine (LOMOTIL) 2.5-0.025 MG per tablet Take 1 or 2 tablets prior to each meal as needed for diarrhea Patient not taking: Reported on 11/10/2014 11/04/14   Gatha Mayer, MD  pantoprazole (PROTONIX) 40 MG tablet Take 1 tablet (40 mg total) by mouth 2 (two) times daily before a meal. 12/02/14   Michael Boston, MD   Vital Signs: BP 116/58 mmHg  Pulse 67  Temp(Src) 98.2 F (36.8 C) (Oral)  Resp 17  Ht 4\' 11"  (1.499 m)  Wt 139 lb 5 oz (63.192 kg)  BMI 28.12 kg/m2  SpO2 93%  Physical Exam General: A&Ox3, NAD ABD: Soft, NT, RUQ drain intact with light green output 50 cc in bag  Imaging: Dg Chest 1 View  12/05/2014   CLINICAL  DATA:  Status post right-sided thoracentesis.  EXAM: CHEST 1 VIEW  COMPARISON:  December 04, 2014.  FINDINGS: Stable cardiomediastinal silhouette. No pneumothorax is noted. Right pleural effusion noted on prior exam is significantly smaller status post thoracentesis, although mild residual effusion remains. Elevated right hemidiaphragm is noted as well. Stable interstitial densities are noted in the left lung concerning for pulmonary edema. Bony thorax is unremarkable.  IMPRESSION: No pneumothorax status post right-sided thoracentesis. Right pleural effusion is significantly smaller compared to prior exam. Stable interstitial densities noted in left lung concerning for pulmonary edema.   Electronically Signed   By: Marijo Conception, M.D.   On: 12/05/2014 11:56   Dg Chest 2 View  12/04/2014   CLINICAL DATA:  Hypoxia.  Right upper quadrant pain.  EXAM: CHEST  2 VIEW  COMPARISON:  Included lung bases from CT abdomen/ pelvis 12/01/2014  FINDINGS: Moderate right pleural effusion with adjacent basilar airspace disease, appears increased from prior CT. Minimal blunting of left costophrenic angle, likely small left pleural effusion. There is no pulmonary edema. Heart size appears normal, mediastinal contours partially obscured. No pneumothorax. No acute osseous abnormalities are seen. A drain is present in the right upper quadrant of the abdomen.  IMPRESSION: Increased size of right pleural effusion, now moderate in degree. Associated basilar airspace disease, likely compressive atelectasis. Small left pleural effusion.   Electronically Signed   By: Fonnie Birkenhead.D.  On: 12/04/2014 18:51   Ir Sinus/fist Tube Chk-non Gi  12/05/2014   CLINICAL DATA:  59 year old female with a history of anterior abdominal fluid collection of the right upper quadrant.  Ultrasound-guided drainage was performed 12/02/2014.  EXAM: DRAINAGE CATHETER INJECTION  TECHNIQUE: Indwelling catheter was injected under fluoroscopy.  FLUOROSCOPY  TIME:  12 seconds  COMPARISON:  CT 12/01/2014, ultrasound 12/02/2014  FINDINGS: Patient is position supine position on the fluoroscopy table in the Interventional Radiology suite.  Small amount of contrast was infused through the indwelling catheter in the right upper quadrant.  No significant cavity size was present surrounding the pigtail catheter drain. No fistulous connection to bowel identified.  IMPRESSION: Injection of catheter in the right upper quadrant demonstrates no fistulous connection to bowel. No significant abscess cavity remains.  Signed,  Dulcy Fanny. Earleen Newport DO  [facility]  [facility] Radiology  PLAN: Patient should maintain a log of al put daily after discharge.  Would refer to [facility] drain clinic in 1-2 weeks after discharge for a drain check.   Electronically Signed   By: Corrie Mckusick D.O.   On: 12/05/2014 17:42   Dg Duanne Limerick W/water Sol Cm  12/03/2014   CLINICAL DATA:  Abscesses around the stomach and liver. History of peptic ulcer disease and distal antrectomy with gastrojejunostomy. Concern for anastomotic leak.  EXAM: WATER SOLUBLE UPPER GI SERIES  TECHNIQUE: Single-column upper GI series was performed using water soluble contrast.  CONTRAST:  64mL OMNIPAQUE IOHEXOL 300 MG/ML  SOLN  COMPARISON:  CT 12/01/2014.  FLUOROSCOPY TIME:  Radiation Exposure Index (as provided by the fluoroscopic device):  If the device does not provide the exposure index:  Fluoroscopy Time (in minutes and seconds):  1 minutes 24 seconds  Number of Acquired Images:  18  FINDINGS: No esophageal stricture. No evidence of leak from the stomach. There is mild fold thickening in the distal stomach suggesting gastritis. There is prompt emptying of the stomach into the jejunum. No anastomotic leak. No evidence of small bowel obstruction.  IMPRESSION: No evidence of anastomotic leak or obstruction. Mild fold thickening in the distal stomach likely reflects gastritis.    Electronically Signed   By: Rolm Baptise M.D.   On: 12/03/2014 12:53   [place] Image Guided Drainage By Percutaneous Catheter  12/02/2014   CLINICAL DATA:  59 year old female with a history of prior gastric bypass procedure with subsequent postoperative fluid collection in the upper abdomen. She has been referred for drainage.  EXAM: ULTRASOUND GUIDED DRAINAGE OF RIGHT UPPER QUADRANT FLUID COLLECTION  MEDICATIONS: 2.0 mg IV Versed; 200 mcg IV Fentanyl  Total Moderate Sedation Time: 20  PROCEDURE: The procedure, risks, benefits, and alternatives were explained to the patient. Questions regarding the procedure were encouraged and answered. The patient understands and consents to the procedure.  Patient is position supine position on a stretcher. Ultrasound survey of the upper abdomen was performed with images stored and sent to PACs.  The right upper quadrant was prepped with chlorhexidine in a sterile fashion, and a sterile drape was applied covering the operative field. A sterile gown and sterile gloves were used for the procedure. Local anesthesia was provided with 1% Lidocaine.  Using ultrasound guidance the skin and subcutaneous tissues were generously infiltrated with 1% lidocaine to the level of the abdominal wall.  Using ultrasound imaging a 10 French drain was advanced into the fluid collection using a trocar technique.  Once the drain was in place approximately 90 cc of  bilious fluid was aspirated. A sample sent to the lab.  Retention suture was placed in the drain was placed to gravity bag.  Patient tolerated the procedure well and remained hemodynamically stable throughout.  No complications encountered and no significant blood loss encounter.  COMPLICATIONS: None.  FINDINGS: Ultrasound images demonstrate fluid collection overlying the liver.  90 cc of bilious fluid was drained from the fluid collection.  IMPRESSION: Status post ultrasound-guided drain placement into right upper quadrant fluid collection  with 90 cc of bilious fluid aspirated.  Signed,  Dulcy Fanny. Earleen Newport, DO  [facility]  [facility] Radiology   Electronically Signed   By: Corrie Mckusick D.O.   On: 12/02/2014 18:08   US Thoracentesis Asp Pleural Space W/img Guide  12/05/2014   INDICATION: Symptomatic right sided pleural effusion  EXAM: US THORACENTESIS ASP PLEURAL SPACE W/IMG GUIDE  COMPARISON:  CXR 12/04/2014.  MEDICATIONS: None  COMPLICATIONS: None immediate  TECHNIQUE: Informed written consent was obtained from the patient after a discussion of the risks, benefits and alternatives to treatment. A timeout was performed prior to the initiation of the procedure.  Initial ultrasound scanning demonstrates a right pleural effusion. The lower chest was prepped and draped in the usual sterile fashion. 1% lidocaine was used for local anesthesia.  Under direct ultrasound guidance, a 19 gauge, 7-cm, Yueh catheter was introduced. An ultrasound image was saved for documentation purposes. The thoracentesis was performed. The catheter was removed and a dressing was applied. The patient tolerated the procedure well without immediate post procedural complication. The patient was escorted to have an upright chest radiograph.  FINDINGS: A total of approximately 700 ml of serous fluid was removed. Requested samples were sent to the laboratory.  IMPRESSION: Successful ultrasound-guided right sided thoracentesis yielding 700 ml of pleural fluid.  Read By:  Tsosie Billing PA-C   Electronically Signed   By: Corrie Mckusick D.O.   On: 12/05/2014 13:08    Labs:  CBC:  Recent Labs  12/03/14 0422 12/04/14 0402 12/05/14 0751 12/06/14 0024  WBC 20.7* 15.4* 14.9* 14.9*  HGB 10.6* 10.0* 10.3* 10.1*  HCT 31.7* 30.6* 30.8* 30.0*  PLT 234 239 258 279    COAGS:  Recent Labs  12/02/14 0759  INR 1.20  APTT 28    BMP:  Recent Labs  12/01/14 0522 12/03/14 0422 12/05/14 1710 12/06/14 0024  NA 136 135 135 136  K 4.6 3.6  2.8* 2.8*  CL 102 103 98* 98*  CO2 27 24 27 30   GLUCOSE 148* 126* 105* 113*  BUN 7 9 <5* <5*  CALCIUM 9.0 7.9* 7.8* 8.0*  CREATININE 0.67 0.68 0.54 0.53  GFRNONAA >60 >60 >60 >60  GFRAA >60 >60 >60 >60    LIVER FUNCTION TESTS:  Recent Labs  11/30/14 0116 12/01/14 0522 12/03/14 1219 12/05/14 1710 12/06/14 0024  BILITOT 0.3 0.4 0.9 0.5 0.7  AST 48* 19  --  186* 112*  ALT 59* 36  --  154* 134*  ALKPHOS 131* 95  --  226* 213*  PROT 7.2 6.6  --  5.5* 5.6*  ALBUMIN 3.5 2.8*  --  1.8* 1.9*    Assessment and Plan: History of PUD and distal antrectomy and gastrojejunostomy  RUQ abdominal abscess S/p perc drain 12/02/14 with 125 cc/24 hrs, Cx multi-organisms, wbc trending down, afebrile S/p UGI 9/24 without evidence for anastomotic leak, drain output Bilirubin and Amylase pending  S/p drain injection in IR no evidence of fistula to bowel Plans per  CCS  Recommend F/u in IR drain clinic in 1-2 weeks with repeat CT and drain injection possible removal- order placed, patient instructed to keep output log and will need to flush daily.   SignedHedy Jacob 12/06/2014, 11:52 AM   I spent a total of 15 Minutes at the the patient's bedside AND on the patient's hospital floor or unit, greater than 50% of which was counseling/coordinating care for abdominal abscess.

## 2014-12-06 NOTE — Progress Notes (Signed)
[facility]    Date of Admission:  11/30/2014   Total days of antibiotics 7        Day 7 fidoxamicin        Day 6 piptazo        Day 1 fluconazole           ID: Shelly Hood is a 59 y.o. female with  Hx of refractory c.difficile presents with new onset RUQ pain, mild transaminitis found to have perihepatic abscess 5 x 7cm concern for etiology, perf peptic ulcer vs. Now contained duodenal stump. Ruled out  anastomosis leak from previous roux en Y, antrectomy, ruled out colitis from c.difficile. Presentation also complicated by pleural effusion s/p 710mL thoracentesis. 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 difficile colitis    Subjective: Isolated tmax of 100 yesterday, overall fever curve improved. She underwent IR procedure in injecting drain that did not suggest further fistula to the bowel.  She was started on fluconazole due to thrush  Labs: WBC at 15K  Medications:  . ALPRAZolam  0.25 mg Oral Daily  . antiseptic oral rinse  7 mL Mouth Rinse BID  . atorvastatin  20 mg Oral q1800  . famotidine  20 mg Oral BID  . feeding supplement (GLUCERNA SHAKE)  237 mL Oral BID BM  . fidaxomicin  200 mg Oral BID  . fluconazole  200 mg Oral Daily  . FLUoxetine  40 mg Oral BID  . lip balm   Topical BID  . multivitamins with iron  1 tablet Oral Daily  . nystatin  5 mL Oral QID  . piperacillin-tazobactam (ZOSYN)  IV  3.375 g Intravenous 3 times per day  . protein supplement  2 oz Oral BID  . saccharomyces boulardii  250 mg Oral BID  . vitamin C  500 mg Oral BID    Objective: Vital  signs in last 24 hours: Temp:  [98.2 F (36.8 C)-99.5 F (37.5 C)] 99.5 F (37.5 C) (09/27 1500) Pulse Rate:  [67-77] 77 (09/27 1500) Resp:  [17-18] 18 (09/27 1500) BP: (108-123)/(50-66) 123/66 mmHg (09/27 1500) SpO2:  [93 %-98 %] 98 % (09/27 1500) Did not examine  Lab Results  Recent Labs  12/05/14 0751 12/05/14 1710 12/06/14 0024  WBC 14.9*  --  14.9*  HGB 10.3*  --  10.1*  HCT 30.8*  --  30.0*  NA  --  135 136  K  --  2.8* 2.8*  CL  --  98* 98*  CO2  --  27 30  BUN  --  <5* <5*  CREATININE  --  0.54 0.53   Liver Panel  Recent Labs  12/05/14 1710 12/06/14 0024  PROT 5.5* 5.6*  ALBUMIN 1.8* 1.9*  AST 186* 112*  ALT 154* 134*  ALKPHOS 226* 213*  BILITOT 0.5 0.7    Studies/Results: Dg Chest 1 View  12/05/2014   CLINICAL DATA:  Status post right-sided thoracentesis.  EXAM: CHEST 1 VIEW  COMPARISON:  December 04, 2014.  FINDINGS: Stable cardiomediastinal silhouette. No pneumothorax is noted. Right pleural effusion noted on prior  exam is significantly smaller status post thoracentesis, although mild residual effusion remains. Elevated right hemidiaphragm is noted as well. Stable interstitial densities are noted in the left lung concerning for pulmonary edema. Bony thorax is unremarkable.  IMPRESSION: No pneumothorax status post right-sided thoracentesis. Right pleural effusion is significantly smaller compared to prior exam. Stable interstitial densities noted in left lung concerning for pulmonary edema.   Electronically Signed   By: Marijo Conception, M.D.   On: 12/05/2014 11:56   Ir Sinus/fist Tube Chk-non Gi  12/05/2014   CLINICAL DATA:  59 year old female with a history of anterior abdominal fluid collection of the right upper quadrant.  Ultrasound-guided drainage was performed 12/02/2014.  EXAM: DRAINAGE CATHETER INJECTION  TECHNIQUE: Indwelling catheter was injected under fluoroscopy.  FLUOROSCOPY TIME:  12 seconds  COMPARISON:  CT 12/01/2014, ultrasound 12/02/2014   FINDINGS: Patient is position supine position on the fluoroscopy table in the Interventional Radiology suite.  Small amount of contrast was infused through the indwelling catheter in the right upper quadrant.  No significant cavity size was present surrounding the pigtail catheter drain. No fistulous connection to bowel identified.  IMPRESSION: Injection of catheter in the right upper quadrant demonstrates no fistulous connection to bowel. No significant abscess cavity remains.  Signed,  Dulcy Fanny. Earleen Newport DO  [facility]  [facility] Radiology  PLAN: Patient should maintain a log of al put daily after discharge.  Would refer to [facility] drain clinic in 1-2 weeks after discharge for a drain check.   Electronically Signed   By: Corrie Mckusick D.O.   On: 12/05/2014 17:42   US Thoracentesis Asp Pleural Space W/img Guide  12/05/2014   INDICATION: Symptomatic right sided pleural effusion  EXAM: US THORACENTESIS ASP PLEURAL SPACE W/IMG GUIDE  COMPARISON:  CXR 12/04/2014.  MEDICATIONS: None  COMPLICATIONS: None immediate  TECHNIQUE: Informed written consent was obtained from the patient after a discussion of the risks, benefits and alternatives to treatment. A timeout was performed prior to the initiation of the procedure.  Initial ultrasound scanning demonstrates a right pleural effusion. The lower chest was prepped and draped in the usual sterile fashion. 1% lidocaine was used for local anesthesia.  Under direct ultrasound guidance, a 19 gauge, 7-cm, Yueh catheter was introduced. An ultrasound image was saved for documentation purposes. The thoracentesis was performed. The catheter was removed and a dressing was applied. The patient tolerated the procedure well without immediate post procedural complication. The patient was escorted to have an upright chest radiograph.  FINDINGS: A total of approximately 700 ml of serous fluid was removed. Requested samples were sent to  the laboratory.  IMPRESSION: Successful ultrasound-guided right sided thoracentesis yielding 700 ml of pleural fluid.  Read By:  Tsosie Billing PA-C   Electronically Signed   By: Corrie Mckusick D.O.   On: 12/05/2014 13:08     Assessment/Plan: Right upper quadrant perihepatic abscess = based upon studies htat have been done thus far, surgery believes that this may be delayed duodenal bulb leak. culture reveals that it is polymicrobial.  continue with piptazo. Fever curve is slowly trending down. Still has elevated WBC but improving. Currently at 14K. She is currently on day 6 of Iv piptazo. Will plan to continue antibiotics for total of 14 d. Depending if wbc continues to improve, may consider doing oral antibiotics  Surgery and IR plan is to have repeat imaging in 2 weeks to evaluate drain, perihepatic fluid collection, again see if fistula tract to  determine if drain can be removed.  Refractory c.difficile = she was being treated for a relapse prior to coming into the hospital and subsequently switched to fidaxomicin. Currently day 7 of 10.  Interestingly, she has not had perfuse diarrhea or colitis on imaging, suggesting that her recent cdiff test maybe just colonization. Though, she is at risk of relapse with being back on antibiotics  Chronic diarrhea = it would be useful to see what her bowel habits look like with the reinitiation of diet. I support seeing if we can differentiate if she is having a pattern consistent with dumping syndrome. Agree with Dr. Johney Maine' recs with advancing diet. Try supp shakes to see if it does result in worsening diarrhea, increased risk of dumping syndrome. Continue with postgastrectomy diet training reinforcement  Gastritis = recommend h2 blockers instead of ppi to minimize risk of recurrent c.difficile  Pleural effusion = continue to monitor culture.   [facility] Fl[facility] Cell: [phone] Pager: [phone]  12/06/2014,  8:36 PM

## 2014-12-06 NOTE — Consult Note (Signed)
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 Had on-going fevers, w/ right pleural effusion. Thoracentesis done on 9/26 w/ exudative findings. PCCM asked to assist w/ further analysis.   SIGNIFICANT EVENTS    STUDIES:  Echo 9/27: no regional wall motion abnormalities. Doppler parameters are consistent withabnormal left ventricular relaxation (grade 1 diastolic dysfunction). Doppler parameters are consistent with elevatedventricular end-diastolic filling pressure. CT abd/pelvis 9/22: Persistent inflammatory phlegmon involving the space between the liver and the stomach. Interval development of perihepatic abscesses as described above. This could be due to anastomotic breakdown along the hepatic margin of the stomach which has sealed off leak or perforated peptic ulcer. The stomach wall is markedly thickened. No obvious leaking oral contrast. 2. New right pleural effusion and overlying atelectasis.  HISTORY OF PRESENT ILLNESS:   59 y.o. 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 The patient has had C. difficile colitis since April 20 16th. 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 Diarrhea recurred and she was placed on vancomycin on 8/22. She required a second course of vancomycin that was started on 9/14. She presented to the hospital on 9/21 with right upper quadrant pain. She states that she had had ongoing diarrhea prior to presentation to the hospital. Thus  far she has been followed by IR and general surgery. Diagnostic eval in addition to C diff,  demonstrated perihepatic abscess which now has drain placed by IR. There was question as to if she may have had gastric perf or anastomosis leak resulting in the abscess, a gastrograffin test and evaluation thus far has been negative. In spite of drainage she has had persistent fever and also noted to have O2 dependence and hypoxia. A right pleural effusion was noted on CXRs and prior CT imaging so she underwent diagnostic thoracentesis on 9/26. The fluid analysis was exudative by Light's criteria. PCCM was asked to see to help in fluid analysis interpretation and to make additional recommendations re: evaluation of pleural fluid.   PAST MEDICAL HISTORY :   has a past medical history of Panic attacks; High cholesterol; Obesity; Gastric outlet obstruction; Pyloric ulcer; Depression; GERD (gastroesophageal reflux disease); Pyloric stenosis; Clostridium difficile infection; Clostridium difficile colitis (11/30/2014); Peptic ulcer disease (12/02/2014); and Thrombocytopenia (06/26/2014).  has past surgical history that includes Rhinoplasty; COLONSCOPY (AGE 59); Vagotomy (N/A, 06/24/2014); Antrectomy (N/A, 06/24/2014); Laparoscopic roux-en-y gastric bypass with hiatal hernia repair (N/A, 06/24/2014); Colonoscopy (N/A, 10/19/2014); and Fecal transplant (N/A, 10/19/2014). Prior to Admission medications   Medication Sig Start Date End Date Taking? Authorizing Provider  acetaminophen (TYLENOL) 500 MG tablet Take 1,000 mg by mouth every 6 (six) hours as needed for mild pain or fever (pain and fever).    Yes Historical Provider, MD  ALPRAZolam (XANAX) 0.25 MG tablet Take 0.25 mg by mouth daily.    Yes Historical Provider, MD  atorvastatin (LIPITOR) 20 MG tablet Take 1 tablet (20 mg total) by mouth daily at 6 PM. Patient taking differently: Take 20 mg by mouth daily.  04/27/14  Yes Venia Carbon, MD  FLUoxetine (PROZAC) 40 MG  capsule Take 1 capsule (40 mg total) by mouth daily. 04/27/14  Yes Venia Carbon, MD  saccharomyces boulardii (FLORASTOR) 250 MG capsule Take 500 mg by mouth daily.   Yes Historical Provider, MD  vancomycin (VANCOCIN HCL) 125 MG capsule Take 1 capsule (125 mg total) by mouth 4 (four) times daily. 11/23/14  Yes Carlyle Basques, MD  diphenoxylate-atropine (LOMOTIL) 2.5-0.025 MG per tablet Take 1 or 2 tablets prior to each meal as needed for diarrhea Patient not taking: Reported on 11/10/2014 11/04/14   Gatha Mayer, MD  pantoprazole (PROTONIX) 40 MG tablet Take 1 tablet (40 mg total) by mouth 2 (two) times daily before a meal. 12/02/14   Michael Boston, MD   Allergies  Allergen Reactions  . Cefotetan Other (See Comments)    Thrombocytopenia, plat ct 18K  . Naproxen     ulcers  . Nsaids     DUODENAL ULCER  . Aspirin     Ulcers   . Ibuprofen     ulcers   . Toradol [Ketorolac Tromethamine]     ulcers  . Codeine Sulfate Nausea Only    REACTION: nausea only tolerates hydrocodone  . Ensure [Nutritional Supplements] Diarrhea    Dumping syndrome  . Flagyl [Metronidazole] Other (See Comments)    Weak, bad taste     FAMILY HISTORY:  family history includes Diabetes in her maternal grandmother and mother; Heart disease in her father. There is no history of Colon cancer. SOCIAL HISTORY:  reports that she quit smoking about 5 months ago. Her smoking use included Cigarettes. She has a 7.5 pack-year smoking history. She has never used smokeless tobacco. She reports that she does not drink alcohol or use illicit drugs.  REVIEW OF SYSTEMS:   Constitutional: Negative for fever, chills, weight loss, malaise/fatigue and diaphoresis.  HENT: Negative for hearing loss, ear pain, nosebleeds, congestion, sore throat, neck pain, tinnitus and ear discharge.   Eyes: Negative for blurred vision, double vision, photophobia, pain, discharge and redness.  Respiratory: Negative for cough, hemoptysis, sputum  production, + shortness of breath, which has now improved, no pleuritic type chest pain, no cough, no wheezing and stridor.   Cardiovascular: Negative for chest pain, palpitations, orthopnea, claudication, leg swelling and PND.  Gastrointestinal: Negative for heartburn, nausea, vomiting, abdominal pain w/ deep breath, diarrhea, +, constipation, blood in stool and melena.  Genitourinary: Negative for dysuria, urgency, frequency, hematuria and flank pain.  Musculoskeletal: Negative for myalgias, back pain, joint pain and falls.  Skin: Negative for itching and rash.  Neurological: Negative for dizziness, tingling, tremors, sensory change, speech change, focal weakness, seizures, loss of consciousness, weakness and headaches.  Endo/Heme/Allergies: Negative for environmental allergies and polydipsia. Does not bruise/bleed easily.  SUBJECTIVE:  No distress  VITAL SIGNS: Temp:  [98.2 F (36.8 C)-100 F (37.8 C)] 98.2 F (36.8 C) (09/27 0522) Pulse Rate:  [67-70] 67 (09/27 0522) Resp:  [17-18] 17 (09/27 0522) BP: (108-135)/(50-65) 116/58 mmHg (09/27 0522) SpO2:  [93 %-96 %] 93 % (09/27 0522)  PHYSICAL EXAMINATION: General:  59 year old female in no acute distress  Neuro:  Awake, alert, no focal def  HEENT:  NCAT, no JVD  Cardiovascular:  rrr Lungs:  Decreased right base, crackles left no accessory muscle use  Abdomen:  Soft, a little tender RUQ drain yellow drainage  Musculoskeletal:  Intact  Skin:  Intact    Recent Labs Lab 12/03/14 0422 12/05/14 1710  12/06/14 0024  NA 135 135 136  K 3.6 2.8* 2.8*  CL 103 98* 98*  CO2 24 27 30   BUN 9 <5* <5*  CREATININE 0.68 0.54 0.53  GLUCOSE 126* 105* 113*    Recent Labs Lab 12/04/14 0402 12/05/14 0751 12/06/14 0024  HGB 10.0* 10.3* 10.1*  HCT 30.6* 30.8* 30.0*  WBC 15.4* 14.9* 14.9*  PLT 239 258 279   Dg Chest 1 View  12/05/2014   CLINICAL DATA:  Status post right-sided thoracentesis.  EXAM: CHEST 1 VIEW  COMPARISON:  December 04, 2014.  FINDINGS: Stable cardiomediastinal silhouette. No pneumothorax is noted. Right pleural effusion noted on prior exam is significantly smaller status post thoracentesis, although mild residual effusion remains. Elevated right hemidiaphragm is noted as well. Stable interstitial densities are noted in the left lung concerning for pulmonary edema. Bony thorax is unremarkable.  IMPRESSION: No pneumothorax status post right-sided thoracentesis. Right pleural effusion is significantly smaller compared to prior exam. Stable interstitial densities noted in left lung concerning for pulmonary edema.   Electronically Signed   By: Marijo Conception, M.D.   On: 12/05/2014 11:56   Dg Chest 2 View  12/04/2014   CLINICAL DATA:  Hypoxia.  Right upper quadrant pain.  EXAM: CHEST  2 VIEW  COMPARISON:  Included lung bases from CT abdomen/ pelvis 12/01/2014  FINDINGS: Moderate right pleural effusion with adjacent basilar airspace disease, appears increased from prior CT. Minimal blunting of left costophrenic angle, likely small left pleural effusion. There is no pulmonary edema. Heart size appears normal, mediastinal contours partially obscured. No pneumothorax. No acute osseous abnormalities are seen. A drain is present in the right upper quadrant of the abdomen.  IMPRESSION: Increased size of right pleural effusion, now moderate in degree. Associated basilar airspace disease, likely compressive atelectasis. Small left pleural effusion.   Electronically Signed   By: Jeb Levering M.D.   On: 12/04/2014 18:51   Ir Sinus/fist Tube Chk-non Gi  12/05/2014   CLINICAL DATA:  59 year old female with a history of anterior abdominal fluid collection of the right upper quadrant.  Ultrasound-guided drainage was performed 12/02/2014.  EXAM: DRAINAGE CATHETER INJECTION  TECHNIQUE: Indwelling catheter was injected under fluoroscopy.  FLUOROSCOPY TIME:  12 seconds  COMPARISON:  CT 12/01/2014, ultrasound 12/02/2014  FINDINGS: Patient is  position supine position on the fluoroscopy table in the Interventional Radiology suite.  Small amount of contrast was infused through the indwelling catheter in the right upper quadrant.  No significant cavity size was present surrounding the pigtail catheter drain. No fistulous connection to bowel identified.  IMPRESSION: Injection of catheter in the right upper quadrant demonstrates no fistulous connection to bowel. No significant abscess cavity remains.  Signed,  Dulcy Fanny. Earleen Newport DO  [facility]  [facility]  PLAN: Patient should maintain a log of al put daily after discharge.  Would refer to [facility] drain clinic in 1-2 weeks after discharge for a drain check.   Electronically Signed   By: Corrie Mckusick D.O.   On: 12/05/2014 17:42   US Thoracentesis Asp Pleural Space W/img Guide  12/05/2014   INDICATION: Symptomatic right sided pleural effusion  EXAM: US THORACENTESIS ASP PLEURAL SPACE W/IMG GUIDE  COMPARISON:  CXR 12/04/2014.  MEDICATIONS: None  COMPLICATIONS: None immediate  TECHNIQUE: Informed written consent was obtained from the patient after a discussion of the risks, benefits and alternatives to treatment. A timeout was performed prior to the initiation of the procedure.  Initial  ultrasound scanning demonstrates a right pleural effusion. The lower chest was prepped and draped in the usual sterile fashion. 1% lidocaine was used for local anesthesia.  Under direct ultrasound guidance, a 19 gauge, 7-cm, Yueh catheter was introduced. An ultrasound image was saved for documentation purposes. The thoracentesis was performed. The catheter was removed and a dressing was applied. The patient tolerated the procedure well without immediate post procedural complication. The patient was escorted to have an upright chest radiograph.  FINDINGS: A total of approximately 700 ml of serous fluid was removed. Requested samples were sent to the laboratory.   IMPRESSION: Successful ultrasound-guided right sided thoracentesis yielding 700 ml of pleural fluid.  Read By:  Tsosie Billing PA-C   Electronically Signed   By: Corrie Mckusick D.O.   On: 12/05/2014 13:08    ASSESSMENT / PLAN:  Right Exudative Pleural effusion, likely reactive from recent abd surgeries.  Plan Re-image w/ CT scan to r/o loculations  Right LL ATX: likely d/t abd pain and guarding Plan IS/push mobility  Ck walking pulse ox   C diff Plan  Per ID  Perihepatic abscess Plan Cont drainage per surg and IR team  All other issues per IM service   Erick Colace ACNP-BC [place] Pager # [phone] OR # [phone] if no answer      P[facility] and [place] Pager: [phone]  12/06/2014, 9:07 AM

## 2014-12-07 DIAGNOSIS — F413 Other mixed anxiety disorders: Secondary | ICD-10-CM

## 2014-12-07 DIAGNOSIS — R509 Fever, unspecified: Secondary | ICD-10-CM

## 2014-12-07 DIAGNOSIS — R0902 Hypoxemia: Secondary | ICD-10-CM | POA: Diagnosis present

## 2014-12-07 DIAGNOSIS — K279 Peptic ulcer, site unspecified, unspecified as acute or chronic, without hemorrhage or perforation: Secondary | ICD-10-CM

## 2014-12-07 LAB — CBC
HEMATOCRIT: 32.6 % — AB (ref 36.0–46.0)
Hemoglobin: 10.4 g/dL — ABNORMAL LOW (ref 12.0–15.0)
MCH: 26.6 pg (ref 26.0–34.0)
MCHC: 31.9 g/dL (ref 30.0–36.0)
MCV: 83.4 fL (ref 78.0–100.0)
Platelets: 384 10*3/uL (ref 150–400)
RBC: 3.91 MIL/uL (ref 3.87–5.11)
RDW: 15.6 % — AB (ref 11.5–15.5)
WBC: 14.1 10*3/uL — AB (ref 4.0–10.5)

## 2014-12-07 LAB — POTASSIUM: Potassium: 3.3 mmol/L — ABNORMAL LOW (ref 3.5–5.1)

## 2014-12-07 LAB — MAGNESIUM: MAGNESIUM: 2.2 mg/dL (ref 1.7–2.4)

## 2014-12-07 MED ORDER — POTASSIUM CHLORIDE CRYS ER 20 MEQ PO TBCR
40.0000 meq | EXTENDED_RELEASE_TABLET | Freq: Two times a day (BID) | ORAL | Status: DC
  Administered 2014-12-07: 40 meq via ORAL
  Filled 2014-12-07: qty 2

## 2014-12-07 MED ORDER — PRO-STAT SUGAR FREE PO LIQD
30.0000 mL | Freq: Three times a day (TID) | ORAL | Status: DC
  Administered 2014-12-07 – 2014-12-08 (×3): 30 mL via ORAL
  Administered 2014-12-08: 10:00:00 via ORAL
  Administered 2014-12-09 – 2014-12-10 (×5): 30 mL via ORAL
  Filled 2014-12-07 (×10): qty 30

## 2014-12-07 MED ORDER — POTASSIUM CHLORIDE CRYS ER 20 MEQ PO TBCR
60.0000 meq | EXTENDED_RELEASE_TABLET | Freq: Four times a day (QID) | ORAL | Status: AC
  Administered 2014-12-07 (×2): 60 meq via ORAL
  Filled 2014-12-07: qty 3
  Filled 2014-12-07: qty 6
  Filled 2014-12-07: qty 3

## 2014-12-07 NOTE — Consult Note (Addendum)
   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 Had on-going fevers, w/ right pleural effusion. Thoracentesis done on 9/26 w/ exudative findings. PCCM asked to assist w/ further analysis.   SIGNIFICANT EVENTS    STUDIES:  Echo 9/27: no regional wall motion abnormalities. Doppler parameters are consistent withabnormal left ventricular relaxation (grade 1 diastolic dysfunction). Doppler parameters are consistent with elevatedventricular end-diastolic filling pressure. CT abd/pelvis 9/22: Persistent inflammatory phlegmon involving the space between the liver and the stomach. Interval development of perihepatic abscesses as described above. This could be due to anastomotic breakdown along the hepatic margin of the stomach which has sealed off leak or perforated peptic ulcer. The stomach wall is markedly thickened. No obvious leaking oral contrast. 2. New right pleural effusion and overlying atelectasis.   SUBJECTIVE: no distress   VITAL SIGNS: Temp:  [98.2 F (36.8 C)-100 F (37.8 C)] 98.2 F (36.8 C) (09/28 0548) Pulse Rate:  [66-77] 66 (09/28 0548) Resp:  [17-18] 17 (09/28 0548) BP: (123-128)/(60-66) 123/66 mmHg (09/28 0548) SpO2:  [95 %-98 %] 95 % (09/28 0548)  PHYSICAL EXAMINATION: General: sitting up in bed HEENT: NCAT OP clear PULM: CTA B CV: RRR, no mgr GI: BS+, soft MSK: normal bulk/tone   Recent Labs Lab 12/03/14 0422 12/05/14 1710 12/06/14 0024 12/07/14 0519  NA 135 135 136  --   K 3.6 2.8* 2.8* 3.3*  CL 103 98* 98*  --   CO2 24 27 30   --   BUN 9 <5* <5*  --   CREATININE 0.68 0.54 0.53  --   GLUCOSE 126* 105* 113*  --     Recent Labs Lab  12/05/14 0751 12/06/14 0024 12/07/14 0519  HGB 10.3* 10.1* 10.4*  HCT 30.8* 30.0* 32.6*  WBC 14.9* 14.9* 14.1*  PLT 258 279 384   CXR images personally reviewed> atelectasis R base, trace effusion  ASSESSMENT / PLAN:  Right Exudative Pleural effusion, likely reactive from recent abd surgeries> no sign of infection or indication for drainage right now Plan Monitor with imaging PRN (if dyspnea worsens) Follow pleural fluid culture> call us if it is positive   Right LL ATX: likely d/t abd pain and guarding Plan IS/push mobility    C diff Plan  Per ID  Perihepatic abscess Plan Cont drainage per surg and IR team  All other issues per IM service  PCCM off  Roselie Awkward, MD [place] [facility] Pager: [phone] Cell: [phone] After 3pm or if no response, call [phone]

## 2014-12-07 NOTE — Progress Notes (Signed)
[facility] Progress Note   Shelly Hood  WNU:[id number]  DOB: 20-Feb-1956  DOA: 11/30/2014 PCP: Viviana Simpler, MD  Brief narrative: Shelly Hood is a 59 y.o. 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 The patient has had C. difficile colitis since April 20 16th. 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 Diarrhea recurred and she was placed on vancomycin on 8/22. She required a second course of vancomycin that was started on 9/14. She presented to the hospital with right upper quadrant pain. She states that she had had ongoing diarrhea prior to presentation to the hospital. Imaging revealed right upper quadrant fluid collection. Right upper quadrant drain placed by IR. The patient continued to have fevers subsequent to this. On admission she had a small right-sided pleural effusion. It was noted that she was slowly becoming more hypoxic and had decreased breath sounds in right middle lung field and therefore a repeat x-ray was done which revealed a now moderate sized right sided pleural effusion. IR guided thoracentesis performed on 9/26.   Subjective: Diarrhea is improving, had around 6 episodes of diarrhea yesterday, none this a.m.   Assessment/Plan:  Sepsis Evident with fever of 101.8 and WBC of 15.4 and evidence of infection. Likely secondary to perihepatic abscess and C. difficile colitis. Patient is on antibiotics. Continue.  Clostridium difficile colitis -Started on the Fidaxomycin by ID day 7/10 -Diarrhea is improving, continue current treatment with Dificid and probiotics.  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 Johney Maine on 06/24/14,  -Question of whether she may have had a gastric perforation or leaking anastomosis which resulted in abscesses-  Upper GI with Gastrograffin does not reveal a leak -Drainage catheter injected 9/26 to look for leak-no fistulous connection to bowel sound- -diet advanced, continued on Zosyn. Fever curve is improving.  Acute hypoxic respiratory failure-right-sided pleural effusion -9/26 Thoracentesis of moderate right-sided pleural effusion-possibly reactive from above-mentioned abscesses -Fluid is exudative-pulmonary consulted for their opinion on whether this is reactive or infectious-they feel it is reactive as well-we'll need to continue to monitor for re-collection -2-D echo ordered to determine if she has underlying heart failure- this reveals that she has a normal EF and grade 1 diastolic dysfunction. -Continue to wean oxygen as able  Elevated LFTs -Possibly in relation to perihepatic abscess-improved   Anorexia -Have ordered resource boost and ensure  Depression/anxiety -Continue Prozac and Xanax  Thrush  - improving with nystatin-Diflucan added by surgery today  Hypokalemia -Replete with oral supplements, magnesium is 2.2. Likely secondary to the diarrhea.   Code Status:     Code Status Orders        Start     Ordered   11/30/14 0939  Full code   Continuous     11/30/14 0938    Advance Directive Documentation        Most Recent Value   Type of Advance Directive  Mental Health Advance Directive   Pre-existing out of facility DNR  order (yellow form or pink MOST form)     "MOST" Form in Place?       Family Communication: Daughter and husband DVT prophylaxis: Lovenox Consultants: GI, ID, gen  surgery, IR, pulmonary  Procedures: 9/23 Right upper quadrant drain by IR 9/26 right Sided thoracentesis by IR  2-D echo Left ventricle: The cavity size was normal. Systolic function was normal. Wall motion was normal; there were no regional wall motion abnormalities. Doppler parameters are consistent with abnormal left ventricular relaxation (grade 1 diastolic dysfunction). Doppler parameters are consistent with elevated ventricular end-diastolic filling pressure. - Aortic valve: Trileaflet; normal thickness leaflets. There was no regurgitation. - Aortic root: The aortic root was normal in size. - Left atrium: The atrium was normal in size. - Right ventricle: The cavity size was normal. Wall thickness was normal. Systolic function was normal. - Right atrium: The atrium was normal in size. - Tricuspid valve: There was moderate regurgitation. - Pulmonic valve: There was no regurgitation. - Pulmonary arteries: The main pulmonary artery was normal-sized. Systolic pressure was moderately increased. PA peak pressure: 51 mm Hg (S). - Inferior vena cava: The vessel was normal in size. - Pericardium, extracardiac: There was no pericardial effusion.  Antibiotics: Anti-infectives    Start     Dose/Rate Route Frequency Ordered Stop   12/06/14 0845  fluconazole (DIFLUCAN) tablet 200 mg     200 mg Oral Daily 12/06/14 0841 12/09/14 0959   12/01/14 2230  piperacillin-tazobactam (ZOSYN) IVPB 3.375 g     3.375 g 12.5 mL/hr over 240 Minutes Intravenous 3 times per day 12/01/14 2138     12/01/14 1315  fidaxomicin (DIFICID) tablet 200 mg     200 mg Oral 2 times daily 12/01/14 1304     11/30/14 1500  metroNIDAZOLE (FLAGYL) IVPB 500 mg  Status:  Discontinued     500 mg 100 mL/hr over 60 Minutes Intravenous Every 8 hours 11/30/14 0939 11/30/14 1400   11/30/14 1315  vancomycin (VANCOCIN) 50 mg/mL oral solution 500 mg  Status:  Discontinued     500 mg Oral 4 times per day 11/30/14  1312 11/30/14 1312   11/30/14 1230  fidaxomicin (DIFICID) tablet 200 mg  Status:  Discontinued     200 mg Oral 2 times daily 11/30/14 1227 12/01/14 1304   11/30/14 0630  metroNIDAZOLE (FLAGYL) IVPB 500 mg     500 mg 100 mL/hr over 60 Minutes Intravenous  Once 11/30/14 0626 11/30/14 0756      Objective: Filed Weights   11/30/14 0105  Weight: 63.192 kg (139 lb 5 oz)    Intake/Output Summary (Last 24 hours) at 12/07/14 1204 Last data filed at 12/07/14 0602  Gross per 24 hour  Intake    230 ml  Output   1175 ml  Net   -945 ml     Vitals Filed Vitals:   12/06/14 0522 12/06/14 1500 12/06/14 2122 12/07/14 0548  BP: 116/58 123/66 128/60 123/66  Pulse: 67 77 74 66  Temp: 98.2 F (36.8 C) 99.5 F (37.5 C) 100 F (37.8 C) 98.2 F (36.8 C)  TempSrc: Oral Oral Oral Oral  Resp: 17 18 18 17   Height:      Weight:      SpO2: 93% 98% 95% 95%    Exam:  General:  Pt is alert, not in acute distress  HEENT: No icterus, No thrush, oral mucosa moist  Cardiovascular: regular rate and rhythm, S1/S2 No murmur  Respiratory: clear to auscultation bilaterally - decreased  breath sounds in right middle and lower lung field  Abdomen: Soft, +Bowel sounds, tender in right upper quadrant, drain with bilious drainage, non distended, no guarding  MSK: No LE edema, cyanosis or clubbing  Data Reviewed: Basic Metabolic Panel:  Recent Labs Lab 12/01/14 0522 12/03/14 0422 12/05/14 1710 12/06/14 0024 12/07/14 0519  NA 136 135 135 136  --   K 4.6 3.6 2.8* 2.8* 3.3*  CL 102 103 98* 98*  --   CO2 27 24 27 30   --   GLUCOSE 148* 126* 105* 113*  --   BUN 7 9 <5* <5*  --   CREATININE 0.67 0.68 0.54 0.53  --   CALCIUM 9.0 7.9* 7.8* 8.0*  --   MG  --   --   --  2.0 2.2   Liver Function Tests:  Recent Labs Lab 12/01/14 0522 12/03/14 1219 12/05/14 1710 12/06/14 0024  AST 19  --  186* 112*  ALT 36  --  154* 134*  ALKPHOS 95  --  226* 213*  BILITOT 0.4 0.9 0.5 0.7  PROT 6.6  --  5.5*  5.6*  ALBUMIN 2.8*  --  1.8* 1.9*    Recent Labs Lab 12/03/14 1219 12/06/14 0024  LIPASE  --  24  AMYLASE 53  --    No results for input(s): AMMONIA in the last 168 hours. CBC:  Recent Labs Lab 12/03/14 0422 12/04/14 0402 12/05/14 0751 12/06/14 0024 12/07/14 0519  WBC 20.7* 15.4* 14.9* 14.9* 14.1*  HGB 10.6* 10.0* 10.3* 10.1* 10.4*  HCT 31.7* 30.6* 30.8* 30.0* 32.6*  MCV 83.0 83.8 82.4 81.7 83.4  PLT 234 239 258 279 384   Cardiac Enzymes: No results for input(s): CKTOTAL, CKMB, CKMBINDEX, TROPONINI in the last 168 hours. BNP (last 3 results) No results for input(s): BNP in the last 8760 hours.  ProBNP (last 3 results) No results for input(s): PROBNP in the last 8760 hours.  CBG: No results for input(s): GLUCAP in the last 168 hours.  Recent Results (from the past 240 hour(s))  Culture, routine-abscess     Status: None   Collection Time: 12/02/14  6:32 PM  Result Value Ref Range Status   Specimen Description ABSCESS ABDOMEN  Final   Special Requests NONE  Final   Gram Stain   Final    NO WBC SEEN NO SQUAMOUS EPITHELIAL CELLS SEEN NO ORGANISMS SEEN Performed at [facility]    Culture   Final    MULTIPLE ORGANISMS PRESENT, NONE PREDOMINANT Note: NO STAPHYLOCOCCUS AUREUS ISOLATED NO GROUP A STREP (S.PYOGENES) ISOLATED Performed at [facility]    Report Status 12/06/2014 FINAL  Final  Culture, body fluid-bottle     Status: None (Preliminary result)   Collection Time: 12/05/14 11:15 AM  Result Value Ref Range Status   Specimen Description FLUID RIGHT PLEURAL  Final   Special Requests BAA 10CCS  Final   Culture NO GROWTH 1 DAY  Final   Report Status PENDING  Incomplete  Gram stain     Status: None   Collection Time: 12/05/14 11:15 AM  Result Value Ref Range Status   Specimen Description FLUID RIGHT PLEURAL  Final   Special Requests NONE  Final   Gram Stain   Final    MODERATE WBC PRESENT,BOTH PMN AND MONONUCLEAR NO ORGANISMS SEEN     Report Status 12/05/2014 FINAL  Final     Studies: Ir Sinus/fist Tube Chk-non Gi  12/05/2014   CLINICAL DATA:  59 year old female  with a history of anterior abdominal fluid collection of the right upper quadrant.  Ultrasound-guided drainage was performed 12/02/2014.  EXAM: DRAINAGE CATHETER INJECTION  TECHNIQUE: Indwelling catheter was injected under fluoroscopy.  FLUOROSCOPY TIME:  12 seconds  COMPARISON:  CT 12/01/2014, ultrasound 12/02/2014  FINDINGS: Patient is position supine position on the fluoroscopy table in the Interventional Radiology suite.  Small amount of contrast was infused through the indwelling catheter in the right upper quadrant.  No significant cavity size was present surrounding the pigtail catheter drain. No fistulous connection to bowel identified.  IMPRESSION: Injection of catheter in the right upper quadrant demonstrates no fistulous connection to bowel. No significant abscess cavity remains.  Signed,  Dulcy Fanny. Earleen Newport DO  [facility]  [facility]  PLAN: Patient should maintain a log of al put daily after discharge.  Would refer to [facility] drain clinic in 1-2 weeks after discharge for a drain check.   Electronically Signed   By: Corrie Mckusick D.O.   On: 12/05/2014 17:42    Scheduled Meds:  Scheduled Meds: . ALPRAZolam  0.25 mg Oral Daily  . antiseptic oral rinse  7 mL Mouth Rinse BID  . atorvastatin  20 mg Oral q1800  . famotidine  20 mg Oral BID  . feeding supplement (GLUCERNA SHAKE)  237 mL Oral BID BM  . fidaxomicin  200 mg Oral BID  . fluconazole  200 mg Oral Daily  . FLUoxetine  40 mg Oral BID  . lip balm   Topical BID  . multivitamins with iron  1 tablet Oral Daily  . nystatin  5 mL Oral QID  . piperacillin-tazobactam (ZOSYN)  IV  3.375 g Intravenous 3 times per day  . potassium chloride  40 mEq Oral BID  . protein supplement  2 oz Oral BID  . saccharomyces boulardii  250 mg Oral BID  . vitamin C   500 mg Oral BID   Continuous Infusions:    Time spent on care of this patient: 35 min   ELMAHI,MUTAZ A, MD 12/07/2014, 12:04 PM  LOS: 7 days   [facility] Office  [phone] Pager - Text Page per [email or web address] If 7PM-7AM, please contact night-coverage [email or web address]

## 2014-12-07 NOTE — Progress Notes (Signed)
Nutrition Follow-up  DOCUMENTATION CODES:   Not applicable  INTERVENTION:   -D/c Glucerna Shake due to poor acceptance -High Protein nourishments between meals -30 ml Prostat TID -Reviewed post-gastrectomy nutrition therapy with pt  NUTRITION DIAGNOSIS:   Inadequate oral intake related to altered GI function as evidenced by meal completion < 50%.  Ongoing  GOAL:   Patient will meet greater than or equal to 90% of their needs  Progressing  MONITOR:   PO intake, Supplement acceptance, Diet advancement, Labs, Weight trends, Skin, I & O's  REASON FOR ASSESSMENT:   Consult Diet education  ASSESSMENT:   Patient is 59 year old female with known history of pyloric ulcer, esophageal reflux disease, pyloric stenosis, gastric outlet obstruction and status post laparoscopic Roux-en-Y bypass and hiatal hernia repair, and 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 Carlean Purl, now presented to [facility] emergency department with progressively worsening watery diarrhea especially over the past 24 hours, associated with constant and sharp right upper and mid area abdominal pain, 10/10 in severity and occasionally but not consistently radiating to entire abdominal area.   Pt s/p rt thoracentesis on 12/05/14; per IR note, 700 ml was taken off and specimen sent.   S/p UGI on 12/03/14, which indicated no leak.  CCM following due to rt pleural effusion; per CCM notes, pt has developed a perihepatic collection, draining bilious fluid.   Pt reports that she is feeling better today and feels optimistic since this is the first day that she has not had diarrhea. She reveals that she has been eating less since her gastrectomy. She shares with this RD that many foods and liquids "run right through her" due to recurrent C-diff. Additionally, she complains of mouth pain due to developing oral thrush since on antibiotics.   Pt reports that the supplements trialed thus far  have been giving her diarrhea ([employer]ure and [employer]). Pt had Glucerna shake at bedside, which was unopened; made pt aware that Glucerna had lower sugar content, however, pt prefers not to drink milky-type supplements ("just the thought of them makes me want to run the bathroom"). She also did not like the Unjury supplement ("too clumpy"). Discussed other supplement options on formulary; she would like to try Prostat, due to lower volume.   Meal completion 10-50%. Pt reports she ordered fruit and a [place] sandwich for lunch. Reviewed post-gastrectomy diet guidelines with pt. Pt was able to verbalize understanding of diet guidelines to this RD. Home diet consisted of 4-6 small meals daily; typical foods consist of crackers, cheese, yogurt, eggs, and salad. She has been very vigilant about monitoring fat and sugar in her diet; discussed low sugar and low fiber food options as well as ideas to add more protein in her diet. Provided "Gastric Surgery Nutrition Therapy" handout from the [employer].  Nutrition-Focused physical exam completed. Findings are no fat depletion, mild muscle depletion, and no edema. Pt reports she is very active at baseline, however, activity has declined since her surgery. Suspect muscle depletion (found only in lower extremities) due to decrease in activity.   Labs reviewed: K: 3.3, elevated ALT/AST.   Diet Order:  Diet bariatric advanced Room service appropriate?: Yes; Fluid consistency:: Thin  Skin:  Reviewed, no issues  Last BM:  12/05/14  Height:   Ht Readings from Last 1 Encounters:  11/30/14 4\' 11"  (1.499 m)    Weight:   Wt Readings from Last 1 Encounters:  11/30/14 139 lb 5  oz (63.192 kg)    Ideal Body Weight:  44.5 kg  BMI:  Body mass index is 28.12 kg/(m^2).  Estimated Nutritional Needs:   Kcal:  1700-1900  Protein:  80-95 grams  Fluid:  1.7-1.9 L  EDUCATION NEEDS:   No education needs identified at this  time  Jenifer A. Jimmye Norman, RD, LDN, CDE Pager: [phone] After hours Pager: [phone]

## 2014-12-07 NOTE — Progress Notes (Signed)
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 difficile colitis      * No surgery found *    Assessment  RUQ collection ? abscess w/o definite leak - improving s/p perc drainage & IV Abx.  Cxs negative so far  Gastritis  Hypoxia w effusion - improved after thoracentesis  SEVERE MALNUTRITION  Plan:  -drain output with normal bilirubin & amylase = not c/w leak -UGI not c/w leak -green output concerning for leak -I suspect that she had a delayed duodenal bulb leak that is closed right now.  -adv diet.  Try supplements - perhaps more solid options since shakes cause dumping.  I AGAIN STRESSED TO HER THAT SHE NEEDS TO EAT AS SHE IS SEVERELY MALNOURISHED.  SHE IS MORE VULBNERABLE TO FURTHER COMPLICATIONS UNTIL HER MALNUTRITION IS CORRECTED.  SHE IS TRYING TO EAT BETTER.   I AM OKAY WITH FAMILY BRINGING AND OTHER FOODS THAT MAY ENTICE HER TO EAT MORE.  -postgastrectomy diet training reinforcement. - kinda like a bypass but really has >50% stomach left  -keep drain & do outpatient fistula study in 2 weeks at the interventional radiology outpatient drain clinic.  If no fistula, no significant cavity,&  output less than 30 mL a day; then, Remove the drain in outpatient clinic.  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  H2B not as good but better than nothing I guess  -check gastrin level w recurrent gastrits after V&Antrectomy - 14 = WNL = gastrinoma  unlikely  -consider EGD to eval gastritis, r/o stricture if no fistula confirmed on drain study Wed.  GI signed off again - see if we can get them back.  -? Pt c/o thrush / odynophagia per pt - give fluc x 3 d (ID / IM can overrule)  -no good evid of colitis on CT scan = ?CDiff colonization & not colitis ?? - defer to ID.  Cont fidaxomicin  -chronic diarrhea for years = reconsider etiology as not just C Diff.  Diarrhea w Ensure = Teach anti dumping diet  -cont ABx - f/u cultures.  WBC flat & pain less so stick w pip/tazo only if ID agrees.   X 14 d per ID.    -thoracentesis for new RLL effusion - C&S neg  so far  [name], M.D., F.A.C.S. Gastrointestinal and [facility]imally [facility]vasive [facility] [address] [place], [place] [address] [phone] Main / Paging   12/07/2014  Subjective:  Pain less Breathing better s/p 722mL drained thoracentesis N/D with ensure - switching to glucerna No V/bloating/emesis  Objective:  Vital signs:  Filed Vitals:   12/06/14 0522 12/06/14 1500 12/06/14 2122 12/07/14 0548  BP: 116/58 123/66 128/60 123/66  Pulse: 67 77 74 66  Temp: 98.2 F (36.8 C) 99.5 F (37.5 C) 100 F (37.8 C) 98.2 F (36.8 C)  TempSrc: Oral Oral Oral Oral  Resp: $Remo'17 18 18 17  'CXeoT$ Height:  Weight:      SpO2: 93% 98% 95% 95%    Last BM Date: 12/06/14  Intake/Output   Yesterday:  09/27 0701 - 09/28 0700 In: 235 [P.O.:220] Out: 5462 [Urine:1100; Drains:75] This shift:     Bowel function:  Flatus: y  BM: y  Drain: serobilious  Physical Exam:  General: Pt awake/alert/oriented x4 in no acute distress Eyes: PERRL, normal EOM.  Sclera clear.  No icterus Neuro: CN II-XII intact w/o focal sensory/motor deficits. Lymph: No head/neck/groin lymphadenopathy Psych:  No delerium/psychosis/paranoia HENT: Normocephalic, Mucus membranes moist.  No thrush Neck: Supple, No tracheal deviation Chest: No chest wall pain w  good excursion.  CTA B - breathing more easily CV:  Pulses intact.  Regular rhythm MS: Normal AROM mjr joints.  No obvious deformity Abdomen: Soft.  Nondistended.  Mildly tender at drain site only.  No evidence of peritonitis.  No incarcerated hernias. Ext:  SCDs BLE.  No mjr edema.  No cyanosis Skin: No petechiae / purpura  Results:   Labs: Results for orders placed or performed during the hospital encounter of 11/30/14 (from the past 48 hour(s))  Lactate dehydrogenase (CSF, pleural or peritoneal fluid)     Status: Abnormal   Collection Time: 12/05/14 11:15 AM  Result Value Ref Range   LD, Fluid 127 (H) 3 - 23 U/L    Comment: (NOTE) Results should be evaluated in conjunction with serum values    Fluid Type-FLDH Pleural R   Body fluid cell count with differential     Status: Abnormal   Collection Time: 12/05/14 11:15 AM  Result Value Ref Range   Fluid Type-FCT Pleural R    Color, Fluid YELLOW (A) YELLOW   Appearance, Fluid CLOUDY (A) CLEAR   WBC, Fluid 4140 (H) 0 - 1000 cu mm   Neutrophil Count, Fluid 71 (H) 0 - 25 %   Lymphs, Fluid 16 %   Monocyte-Macrophage-Serous Fluid 13 (L) 50 - 90 %   Other Cells, Fluid MESOTHELIAL CELLS PRESENT %  Glucose, pleural or peritoneal fluid     Status: None   Collection Time: 12/05/14 11:15 AM  Result Value Ref Range   Glucose, Fluid 107 mg/dL    Comment: (NOTE) No normal range established for this test Results should be evaluated in conjunction with serum values    Fluid Type-FGLU Pleural R   PH, Body Fluid     Status: None   Collection Time: 12/05/14 11:15 AM  Result Value Ref Range   pH, Body Fluid 7.8 Not Estab.    Comment: (NOTE) This test was developed and its performance characteristics determined by [facility]. It has not been cleared or approved by the Food and Drug Administration. Performed At: [facility] [place], [place] [id number] Lindon Romp MD HW:[id number]    Source of Sample PLEU      Comment: RIGHT  Culture, body fluid-bottle     Status: None (Preliminary result)   Collection Time: 12/05/14 11:15 AM  Result Value Ref Range   Specimen Description FLUID RIGHT PLEURAL    Special Requests BAA 10CCS    Culture NO GROWTH 1 DAY    Report Status PENDING   Gram stain     Status: None   Collection Time: 12/05/14 11:15 AM  Result Value Ref Range   Specimen Description FLUID RIGHT PLEURAL    Special Requests NONE    Gram Stain      MODERATE WBC PRESENT,BOTH PMN AND MONONUCLEAR NO ORGANISMS SEEN  Report Status 12/05/2014 FINAL   Pathologist smear review     Status: None   Collection Time: 12/05/14 11:15 AM  Result Value Ref Range   Path Review            Comment: REACTIVE MESOTHELIAL CELLS AND MACROPHAGES PRESENT, NO ATYPIA SEEN. Reviewed by Mali R. Rund, M.D. 12/06/14.   Lactate dehydrogenase     Status: Abnormal   Collection Time: 12/05/14  5:10 PM  Result Value Ref Range   LDH 262 (H) 98 - 192 U/L  Comprehensive metabolic panel     Status: Abnormal   Collection Time: 12/05/14  5:10 PM  Result Value Ref Range   Sodium 135 135 - 145 mmol/L   Potassium 2.8 (L) 3.5 - 5.1 mmol/L   Chloride 98 (L) 101 - 111 mmol/L   CO2 27 22 - 32 mmol/L   Glucose, Bld 105 (H) 65 - 99 mg/dL   BUN <5 (L) 6 - 20 mg/dL   Creatinine, Ser 0.54 0.44 - 1.00 mg/dL   Calcium 7.8 (L) 8.9 - 10.3 mg/dL   Total Protein 5.5 (L) 6.5 - 8.1 g/dL   Albumin 1.8 (L) 3.5 - 5.0 g/dL   AST 186 (H) 15 - 41 U/L   ALT 154 (H) 14 - 54 U/L   Alkaline Phosphatase 226 (H) 38 - 126 U/L   Total Bilirubin 0.5 0.3 - 1.2 mg/dL   GFR calc non Af Amer >60 >60 mL/min   GFR calc Af Amer >60 >60 mL/min    Comment: (NOTE) The eGFR has been calculated using the CKD EPI equation. This calculation has not been validated in all clinical situations. eGFR's persistently <60 mL/min signify possible Chronic Kidney Disease.    Anion gap 10 5 - 15  Prealbumin     Status: Abnormal   Collection Time: 12/06/14 12:24 AM   Result Value Ref Range   Prealbumin 3.7 (L) 18 - 38 mg/dL  Comprehensive metabolic panel     Status: Abnormal   Collection Time: 12/06/14 12:24 AM  Result Value Ref Range   Sodium 136 135 - 145 mmol/L   Potassium 2.8 (L) 3.5 - 5.1 mmol/L   Chloride 98 (L) 101 - 111 mmol/L   CO2 30 22 - 32 mmol/L   Glucose, Bld 113 (H) 65 - 99 mg/dL   BUN <5 (L) 6 - 20 mg/dL   Creatinine, Ser 0.53 0.44 - 1.00 mg/dL   Calcium 8.0 (L) 8.9 - 10.3 mg/dL   Total Protein 5.6 (L) 6.5 - 8.1 g/dL   Albumin 1.9 (L) 3.5 - 5.0 g/dL   AST 112 (H) 15 - 41 U/L   ALT 134 (H) 14 - 54 U/L   Alkaline Phosphatase 213 (H) 38 - 126 U/L   Total Bilirubin 0.7 0.3 - 1.2 mg/dL   GFR calc non Af Amer >60 >60 mL/min   GFR calc Af Amer >60 >60 mL/min    Comment: (NOTE) The eGFR has been calculated using the CKD EPI equation. This calculation has not been validated in all clinical situations. eGFR's persistently <60 mL/min signify possible Chronic Kidney Disease.    Anion gap 8 5 - 15  Lipase, blood     Status: None   Collection Time: 12/06/14 12:24 AM  Result Value Ref Range   Lipase 24 22 - 51 U/L  Magnesium     Status: None   Collection Time: 12/06/14 12:24 AM  Result Value Ref Range   Magnesium 2.0 1.7 - 2.4  mg/dL  CBC     Status: Abnormal   Collection Time: 12/06/14 12:24 AM  Result Value Ref Range   WBC 14.9 (H) 4.0 - 10.5 K/uL   RBC 3.67 (L) 3.87 - 5.11 MIL/uL   Hemoglobin 10.1 (L) 12.0 - 15.0 g/dL   HCT 30.0 (L) 36.0 - 46.0 %   MCV 81.7 78.0 - 100.0 fL   MCH 27.5 26.0 - 34.0 pg   MCHC 33.7 30.0 - 36.0 g/dL   RDW 15.3 11.5 - 15.5 %   Platelets 279 150 - 400 K/uL  CBC     Status: Abnormal   Collection Time: 12/07/14  5:19 AM  Result Value Ref Range   WBC 14.1 (H) 4.0 - 10.5 K/uL   RBC 3.91 3.87 - 5.11 MIL/uL   Hemoglobin 10.4 (L) 12.0 - 15.0 g/dL   HCT 32.6 (L) 36.0 - 46.0 %   MCV 83.4 78.0 - 100.0 fL   MCH 26.6 26.0 - 34.0 pg   MCHC 31.9 30.0 - 36.0 g/dL   RDW 15.6 (H) 11.5 - 15.5 %   Platelets  384 150 - 400 K/uL  Potassium     Status: Abnormal   Collection Time: 12/07/14  5:19 AM  Result Value Ref Range   Potassium 3.3 (L) 3.5 - 5.1 mmol/L  Magnesium     Status: None   Collection Time: 12/07/14  5:19 AM  Result Value Ref Range   Magnesium 2.2 1.7 - 2.4 mg/dL    Imaging / Studies: Dg Chest 1 View  12/05/2014   CLINICAL DATA:  Status post right-sided thoracentesis.  EXAM: CHEST 1 VIEW  COMPARISON:  December 04, 2014.  FINDINGS: Stable cardiomediastinal silhouette. No pneumothorax is noted. Right pleural effusion noted on prior exam is significantly smaller status post thoracentesis, although mild residual effusion remains. Elevated right hemidiaphragm is noted as well. Stable interstitial densities are noted in the left lung concerning for pulmonary edema. Bony thorax is unremarkable.  IMPRESSION: No pneumothorax status post right-sided thoracentesis. Right pleural effusion is significantly smaller compared to prior exam. Stable interstitial densities noted in left lung concerning for pulmonary edema.   Electronically Signed   By: Marijo Conception, M.D.   On: 12/05/2014 11:56   Ir Sinus/fist Tube Chk-non Gi  12/05/2014   CLINICAL DATA:  59 year old female with a history of anterior abdominal fluid collection of the right upper quadrant.  Ultrasound-guided drainage was performed 12/02/2014.  EXAM: DRAINAGE CATHETER INJECTION  TECHNIQUE: Indwelling catheter was injected under fluoroscopy.  FLUOROSCOPY TIME:  12 seconds  COMPARISON:  CT 12/01/2014, ultrasound 12/02/2014  FINDINGS: Patient is position supine position on the fluoroscopy table in the Interventional Radiology suite.  Small amount of contrast was infused through the indwelling catheter in the right upper quadrant.  No significant cavity size was present surrounding the pigtail catheter drain. No fistulous connection to bowel identified.  IMPRESSION: Injection of catheter in the right upper quadrant demonstrates no fistulous  connection to bowel. No significant abscess cavity remains.  Signed,  Dulcy Fanny. Earleen Newport DO  [facility]  [facility]  PLAN: Patient should maintain a log of al put daily after discharge.  Would refer to [facility] drain clinic in 1-2 weeks after discharge for a drain check.   Electronically Signed   By: Corrie Mckusick D.O.   On: 12/05/2014 17:42   US Thoracentesis Asp Pleural Space W/img Guide  12/05/2014   INDICATION: Symptomatic right sided pleural effusion  EXAM:  US THORACENTESIS ASP PLEURAL SPACE W/IMG GUIDE  COMPARISON:  CXR 12/04/2014.  MEDICATIONS: None  COMPLICATIONS: None immediate  TECHNIQUE: Informed written consent was obtained from the patient after a discussion of the risks, benefits and alternatives to treatment. A timeout was performed prior to the initiation of the procedure.  Initial ultrasound scanning demonstrates a right pleural effusion. The lower chest was prepped and draped in the usual sterile fashion. 1% lidocaine was used for local anesthesia.  Under direct ultrasound guidance, a 19 gauge, 7-cm, Yueh catheter was introduced. An ultrasound image was saved for documentation purposes. The thoracentesis was performed. The catheter was removed and a dressing was applied. The patient tolerated the procedure well without immediate post procedural complication. The patient was escorted to have an upright chest radiograph.  FINDINGS: A total of approximately 700 ml of serous fluid was removed. Requested samples were sent to the laboratory.  IMPRESSION: Successful ultrasound-guided right sided thoracentesis yielding 700 ml of pleural fluid.  Read By:  Tsosie Billing PA-C   Electronically Signed   By: Corrie Mckusick D.O.   On: 12/05/2014 13:08    Medications / Allergies: per chart  Antibiotics: Anti-infectives    Start     Dose/Rate Route Frequency Ordered Stop   12/06/14 0845  fluconazole (DIFLUCAN) tablet 200 mg     200 mg Oral  Daily 12/06/14 0841 12/09/14 0959   12/01/14 2230  piperacillin-tazobactam (ZOSYN) IVPB 3.375 g     3.375 g 12.5 mL/hr over 240 Minutes Intravenous 3 times per day 12/01/14 2138     12/01/14 1315  fidaxomicin (DIFICID) tablet 200 mg     200 mg Oral 2 times daily 12/01/14 1304     11/30/14 1500  metroNIDAZOLE (FLAGYL) IVPB 500 mg  Status:  Discontinued     500 mg 100 mL/hr over 60 Minutes Intravenous Every 8 hours 11/30/14 0939 11/30/14 1400   11/30/14 1315  vancomycin (VANCOCIN) 50 mg/mL oral solution 500 mg  Status:  Discontinued     500 mg Oral 4 times per day 11/30/14 1312 11/30/14 1312   11/30/14 1230  fidaxomicin (DIFICID) tablet 200 mg  Status:  Discontinued     200 mg Oral 2 times daily 11/30/14 1227 12/01/14 1304   11/30/14 0630  metroNIDAZOLE (FLAGYL) IVPB 500 mg     500 mg 100 mL/hr over 60 Minutes Intravenous  Once 11/30/14 5631 11/30/14 0756        Note: Portions of this report may have been transcribed using voice recognition software. Every effort was made to ensure accuracy; however, inadvertent computerized transcription errors may be present.   Any transcriptional errors that result from this process are unintentional.     [name], M.D., F.A.C.S. Gastrointestinal and Minimally Invasive Surgery [facility] [address], [place], [place] [address] [phone] Main / Paging   12/07/2014  CARE TEAM:  PCP: Viviana Simpler, MD  Outpatient Care Team: Patient Care Team: Venia Carbon, MD as PCP - General Ladene Artist, MD as Consulting Physician (Gastroenterology) Michael Boston, MD as Consulting Physician (General Surgery) Carlyle Basques, MD as Consulting Physician (Infectious Diseases)  Inpatient Treatment Team: Treatment Team: Attending Provider: Verlee Monte, MD; Attending Physician: Theodis Blaze, MD; Registered Nurse: Suzan Nailer, RN; Registered Nurse: Donzetta Matters, RN; Technician: Francene Finders, NT;  Registered Nurse: Roselind Rily, RN; Registered Nurse: Zachery Conch; Consulting Physician: Michael Boston, MD; Technician: Mort Sawyers, NT; Technician:  Army Chaco, NT; Technician: Wyline Copas, NT; Registered Nurse: Candida Peeling, RN; Registered Nurse: Roetta Sessions, RN; Consulting Physician: Carlyle Basques, MD; Rounding Team: Kelvin Cellar, MD; Technician: Redmond School, NT

## 2014-12-08 DIAGNOSIS — Z903 Acquired absence of stomach [part of]: Secondary | ICD-10-CM

## 2014-12-08 LAB — TOTAL BILIRUBIN, BODY FLUID: Total bilirubin, fluid: 13.5 mg/dL

## 2014-12-08 LAB — BASIC METABOLIC PANEL
Anion gap: 7 (ref 5–15)
CHLORIDE: 100 mmol/L — AB (ref 101–111)
CO2: 30 mmol/L (ref 22–32)
Calcium: 8.7 mg/dL — ABNORMAL LOW (ref 8.9–10.3)
Creatinine, Ser: 0.58 mg/dL (ref 0.44–1.00)
GFR calc Af Amer: >60 mL/min (ref >60)
GFR calc non Af Amer: >60 mL/min (ref >60)
GLUCOSE: 118 mg/dL — AB (ref 65–99)
POTASSIUM: 4.8 mmol/L (ref 3.5–5.1)
SODIUM: 137 mmol/L (ref 135–145)

## 2014-12-08 LAB — CBC
HEMATOCRIT: 31 % — AB (ref 36.0–46.0)
HEMOGLOBIN: 9.9 g/dL — AB (ref 12.0–15.0)
MCH: 26.8 pg (ref 26.0–34.0)
MCHC: 31.9 g/dL (ref 30.0–36.0)
MCV: 83.8 fL (ref 78.0–100.0)
Platelets: 431 10*3/uL — ABNORMAL HIGH (ref 150–400)
RBC: 3.7 MIL/uL — AB (ref 3.87–5.11)
RDW: 15.8 % — ABNORMAL HIGH (ref 11.5–15.5)
WBC: 13.4 10*3/uL — ABNORMAL HIGH (ref 4.0–10.5)

## 2014-12-08 MED ORDER — GI COCKTAIL ~~LOC~~
30.0000 mL | Freq: Three times a day (TID) | ORAL | Status: DC | PRN
  Filled 2014-12-08: qty 30

## 2014-12-08 MED ORDER — SODIUM CHLORIDE 0.9 % IJ SOLN: 10.0000 mL | INTRAMUSCULAR | Status: DC | PRN

## 2014-12-08 MED ORDER — FLUCONAZOLE 100 MG PO TABS
200.0000 mg | ORAL_TABLET | Freq: Every day | ORAL | Status: AC
  Administered 2014-12-08 – 2014-12-10 (×3): 200 mg via ORAL
  Filled 2014-12-08 (×2): qty 2

## 2014-12-08 NOTE — Progress Notes (Signed)
I updated the status of the patient to the patient's daughter on the phone . Philis Nettle Furphy in [place] [phone] until 4 pm, and at [phone] after 4 pm).  Explained the purpose of multidisciplinary approaches to her numerous problems.  I tried to offer hope that her mother's issues are getting gradually under control.  I made recommendations.  I answered questions.  Understanding & appreciation was expressed.  [name], M.D., F.A.C.S. Gastrointestinal and Minimally Invasive Surgery [facility] [address], [place], [place] [address] [phone] Main / Paging

## 2014-12-08 NOTE — Progress Notes (Signed)
Peripherally Inserted Central Catheter/Midline Placement  The IV Nurse has discussed with the patient and/or persons authorized to consent for the patient, the purpose of this procedure and the potential benefits and risks involved with this procedure.  The benefits include less needle sticks, lab draws from the catheter and patient may be discharged home with the catheter.  Risks include, but not limited to, infection, bleeding, blood clot (thrombus formation), and puncture of an artery; nerve damage and irregular heat beat.  Alternatives to this procedure were also discussed.  PICC/Midline Placement Documentation        Shelly Hood 12/08/2014, 6:33 PM

## 2014-12-08 NOTE — Progress Notes (Signed)
[facility] PROGRESS NOTE  Shelly Hood YSA:[id number] DOB: 10-05-1955 DOA: 11/30/2014 PCP: Viviana Simpler, MD  HPI:  Shelly Hood is a 59 y.o. 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 The patient has had C. difficile colitis since April 20 16th. 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 Diarrhea recurred and she was placed on vancomycin on 8/22. She required a second course of vancomycin that was started on 9/14. She presented to the hospital with right upper quadrant pain. She states that she had had ongoing diarrhea prior to presentation to the hospital. Imaging revealed right upper quadrant fluid collection. Right upper quadrant drain placed by IR. The patient continued to have fevers subsequent to this. On admission she had a small right-sided pleural effusion. It was noted that she was slowly becoming more hypoxic and had decreased breath sounds in right middle lung field and therefore a repeat x-ray was done which revealed a now moderate sized right sided pleural effusion. IR guided thoracentesis performed on 9/26.  Subjective: Shelly Hood the patient is doing well. She had 1 BM this morning that was "not loose, but not formed". Denies N/V/D or abdominal pain. Expresses concern about continued oral thrush.    Assessment/Plan: Sepsis - Evident with fever of 101.8 and WBC of 15.4 and evidence of infection. - Likely secondary to perihepatic abscess and C. difficile colitis. - Currently afebrile, leukocytosis improving (13.4). Patient is on antibiotics, Continue.  Clostridium difficile colitis - Started on the Fidaxomycin by ID day 7/10 - Diarrhea continues to improve. - Continue current treatment with Dificid and probiotics.  Perihepatic abscesses -  Discovered on CT scan - -IR consulted for drainage- RUQ drain placed on 9/23 - Cont Zosyn - -Follow up cultures- ID assisting with management - Intermittenlty spiked fevers but temperature curve is improving currently afebrile - History of gastric ulcer resulting in a stricture in outlet obstruction status post s/p robotic truncal vagotomy, distal gastrectomy, roux-en-y and hiatal hernia repair by Dr. Johney Maine on 06/24/14,  - Question of whether she may have had a gastric perforation or leaking anastomosis which resulted in abscesses- Upper GI with Gastrograffin does not reveal a leak - Drainage catheter injected 9/26 to look for leak-no fistulous connection to bowel sound - Diet advanced yesterday without complications - Discussed PICC line placement with patient, scheduled for today, discussed with ID likely she will need IV antibiotics for 7-10 more days.  Acute hypoxic respiratory failure-right-sided pleural effusion - 9/26 Thoracentesis of moderate right-sided pleural effusion-possibly reactive from above-mentioned abscesses - Fluid is exudative-pulmonary consulted for their opinion on whether this is reactive or infectious-they feel it is reactive as well-we'll need to continue to monitor for re-collection - 2-D echo ordered to determine if she has underlying heart failure- this reveals that she has a normal EF and grade 1 diastolic dysfunction. - Continue to wean oxygen as able, remains at 96% on [facility]  Elevated LFTs -Possibly in relation to perihepatic abscess-improved   Anorexia - Diet advanced yesterday, patient has been able to eat soft foods (yogurt, beans) without complications - Burning with acidic/salty foods, recommended to keep foods soft/bland    Depression/anxiety - Continue Prozac and Xanax  Thrush  - Patient complaining of symptoms today although previously improving with nystatin - Diflucan added by surgery yesterday without improvement  -  Will add GI cocktail to  try and relieve symptoms   Hypokalemia - Likely secondary to diarrhea (K: 2.8, Mg: 2.2) - Repleted yesterday with oral supplements, WNL today (K: 4.8)   Code Status: Full  Family Communication: None at bedside Disposition Plan: Home when stable  DVT Prophylaxis: Lovenox   Consultants:  GI  ID  IR  General Surgery   Pulmonary   Procedures:  12/02/14: RUQ drain by IR   12/05/14: R sided thoracentesis by IR   12/05/14: 2-D Echo   Antibiotics: Anti-infectives    Start     Dose/Rate Route Frequency Ordered Stop   12/06/14 0845  fluconazole (DIFLUCAN) tablet 200 mg     200 mg Oral Daily 12/06/14 0841 12/08/14 1002   12/01/14 2230  piperacillin-tazobactam (ZOSYN) IVPB 3.375 g     3.375 g 12.5 mL/hr over 240 Minutes Intravenous 3 times per day 12/01/14 2138     12/01/14 1315  fidaxomicin (DIFICID) tablet 200 mg     200 mg Oral 2 times daily 12/01/14 1304     11/30/14 1500  metroNIDAZOLE (FLAGYL) IVPB 500 mg  Status:  Discontinued     500 mg 100 mL/hr over 60 Minutes Intravenous Every 8 hours 11/30/14 0939 11/30/14 1400   11/30/14 1315  vancomycin (VANCOCIN) 50 mg/mL oral solution 500 mg  Status:  Discontinued     500 mg Oral 4 times per day 11/30/14 1312 11/30/14 1312   11/30/14 1230  fidaxomicin (DIFICID) tablet 200 mg  Status:  Discontinued     200 mg Oral 2 times daily 11/30/14 1227 12/01/14 1304   11/30/14 0630  metroNIDAZOLE (FLAGYL) IVPB 500 mg     500 mg 100 mL/hr over 60 Minutes Intravenous  Once 11/30/14 0626 11/30/14 0756        Objective: Filed Vitals:   12/08/14 0630  BP: 110/52  Pulse: 69  Temp: 98.4 F (36.9 C)  Resp: 16    Intake/Output Summary (Last 24 hours) at 12/08/14 1153 Last data filed at 12/08/14 0645  Gross per 24 hour  Intake    125 ml  Output    860 ml  Net   -735 ml   Filed Weights   11/30/14 0105  Weight: 63.192 kg (139 lb 5 oz)    Exam:   General:  Well-appearing woman, sitting up in bed with no acute  distress  Cardiovascular: RRR, no m/r/g. No JVD or peripheral edema  Respiratory: CTA b/l, no wheeze or crackles  Abdomen: Non-distended, non-tender. +BS. Drain in RUQ with appropriate drainage. Firm in area surrounding drain  Musculoskeletal: Appropriate movement of UE/LE. No cyanosis or clubbing   Neuro: A&Ox3. No focal neurological deficits   Data Reviewed: Basic Metabolic Panel:  Recent Labs Lab 12/03/14 0422 12/05/14 1710 12/06/14 0024 12/07/14 0519 12/08/14 0257  NA 135 135 136  --  137  K 3.6 2.8* 2.8* 3.3* 4.8  CL 103 98* 98*  --  100*  CO2 24 27 30   --  30  GLUCOSE 126* 105* 113*  --  118*  BUN 9 <5* <5*  --  <5*  CREATININE 0.68 0.54 0.53  --  0.58  CALCIUM 7.9* 7.8* 8.0*  --  8.7*  MG  --   --  2.0 2.2  --    Liver Function Tests:  Recent Labs Lab 12/03/14 1219 12/05/14 1710 12/06/14 0024  AST  --  186* 112*  ALT  --  154* 134*  ALKPHOS  --  226*  213*  BILITOT 0.9 0.5 0.7  PROT  --  5.5* 5.6*  ALBUMIN  --  1.8* 1.9*    Recent Labs Lab 12/03/14 1219 12/06/14 0024  LIPASE  --  24  AMYLASE 53  --    No results for input(s): AMMONIA in the last 168 hours. CBC:  Recent Labs Lab 12/04/14 0402 12/05/14 0751 12/06/14 0024 12/07/14 0519 12/08/14 0257  WBC 15.4* 14.9* 14.9* 14.1* 13.4*  HGB 10.0* 10.3* 10.1* 10.4* 9.9*  HCT 30.6* 30.8* 30.0* 32.6* 31.0*  MCV 83.8 82.4 81.7 83.4 83.8  PLT 239 258 279 384 431*   Cardiac Enzymes: No results for input(s): CKTOTAL, CKMB, CKMBINDEX, TROPONINI in the last 168 hours. BNP (last 3 results) No results for input(s): BNP in the last 8760 hours.  ProBNP (last 3 results) No results for input(s): PROBNP in the last 8760 hours.  CBG: No results for input(s): GLUCAP in the last 168 hours.  Recent Results (from the past 240 hour(s))  Culture, routine-abscess     Status: None   Collection Time: 12/02/14  6:32 PM  Result Value Ref Range Status   Specimen Description ABSCESS ABDOMEN  Final   Special  Requests NONE  Final   Gram Stain   Final    NO WBC SEEN NO SQUAMOUS EPITHELIAL CELLS SEEN NO ORGANISMS SEEN Performed at [facility]    Culture   Final    MULTIPLE ORGANISMS PRESENT, NONE PREDOMINANT Note: NO STAPHYLOCOCCUS AUREUS ISOLATED NO GROUP A STREP (S.PYOGENES) ISOLATED Performed at [facility]    Report Status 12/06/2014 FINAL  Final  Culture, body fluid-bottle     Status: None (Preliminary result)   Collection Time: 12/05/14 11:15 AM  Result Value Ref Range Status   Specimen Description FLUID RIGHT PLEURAL  Final   Special Requests BAA 10CCS  Final   Culture NO GROWTH 2 DAYS  Final   Report Status PENDING  Incomplete  Gram stain     Status: None   Collection Time: 12/05/14 11:15 AM  Result Value Ref Range Status   Specimen Description FLUID RIGHT PLEURAL  Final   Special Requests NONE  Final   Gram Stain   Final    MODERATE WBC PRESENT,BOTH PMN AND MONONUCLEAR NO ORGANISMS SEEN    Report Status 12/05/2014 FINAL  Final     Studies: No results found.  Scheduled Meds: . ALPRAZolam  0.25 mg Oral Daily  . antiseptic oral rinse  7 mL Mouth Rinse BID  . atorvastatin  20 mg Oral q1800  . famotidine  20 mg Oral BID  . feeding supplement (PRO-STAT SUGAR FREE 64)  30 mL Oral TID BM  . fidaxomicin  200 mg Oral BID  . FLUoxetine  40 mg Oral BID  . lip balm   Topical BID  . multivitamins with iron  1 tablet Oral Daily  . nystatin  5 mL Oral QID  . piperacillin-tazobactam (ZOSYN)  IV  3.375 g Intravenous 3 times per day  . saccharomyces boulardii  250 mg Oral BID  . 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  I agree with the above assessment and plan.  For full details please see Mrs. Shelly Hood, Student-PA note.  I reviewed and amended the above note as appropriate.   Birdie Hopes, MD [facility] Pager: [phone] 12/08/2014, 12:35 PM

## 2014-12-08 NOTE — Progress Notes (Addendum)
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 abscess w/o definite leak - improving s/p perc drainage & IV Abx.  Cxs negative so far  Gastritis - H2B  Hypoxia w effusion - improved after thoracentesis  SEVERE MALNUTRITION  Plan:  POSSIBLE LEAK - controlled & sealed for now -UGI not c/w leak -green output concerning for leak but drain output with normal bilirubin & amylase = not c/w leak -I suspect that she had a delayed duodenal bulb leak that is closed right now.  Post-gastrectomy type diet.   5-6 small meals w liquids & solids separated to help avoid dumping. Try supplements - perhaps more solid options since shakes cause dumping.  Kinda like a bypass but really has >50% stomach left.  Nutrition help appreciated  I again stressed to her that she needs to eat and get enough protein to avoid staying malnourished.  Try and break the cycle of infection and malnutrition.   She remains very motivated to improve Family foods PRN - OK by me Appreciate Nutrition help  -keep drain & do outpatient fistula study in 2 weeks at the interventional radiology outpatient drain clinic.  If no fistula, no significant cavity,&  output less than 30 mL a day; then, Remove the drain in outpatient clinic.  -high dose acid  blockade for presumed gastritis - switched off PPI given h/o recurrent CDiff.  H2B not as good but better than nothing I guess.  Consider EGD to eval gastritis, r/o stricture if no fistula confirmed on drain study Wed.  GI signed off again - see if we can get them back.  -Gastrin level w recurrent gastrits after V&Antrectomy = 14 = WNL = gastrinoma unlikely  -? Pt c/o thrush / odynophagia per pt - fluc x 3 d (ID / IM can overrule)  -no good evid of colitis on CT scan = ?CDiff colonization & not colitis ?? - defer to ID.  Cont fidaxomicin  -chronic diarrhea for years = reconsider etiology as not just C Diff.  Diarrhea w Ensure = Teach anti dumping diet  -cont ABx - f/u cultures.  WBC flat & pain less so stick w pip/tazo only if ID agrees.   X 14 d per ID.  5d already given only is OK by me.  Consider PO Augmentin at d/c vs ID rec.  -thoracentesis for new RLL effusion - C&S neg  so far  -hypoK - corrected - hold off on supp w K 3.3 to 4.5 this AM.  Defer to medicine  I updated the patient's status to the patient.  Have not seen any other family members for the past few days but did discuss the major plan several days before with husband and ?daughter present.  Recommendations were made.  Questions were answered.  The  patient expressed understanding & appreciation.   D/C patient from hospital when patient meets criteria (anticipate in 0-2 day(s)):  Tolerating oral intake well Ambulating well Adequate pain control without IV medications Urinating  Having flatus Disposition planning in place - drain care/teaching   [name], M.D., F.A.C.S. Gastrointestinal and Minimally Invasive Surgery [facility] [address] [place], [place] [address] [phone] Main / Paging   12/08/2014  Subjective:  Pain low but better Breathing better s/p 717mL drained thoracentesis N/D with ensure - trying other supp.  No more diarrhea No  V/bloating/emesis  Objective:  Vital signs:  Filed Vitals:   12/07/14 1500 12/07/14 1940 12/07/14 2059 12/08/14 0630  BP: 117/51  124/56 110/52  Pulse: 78  87 69  Temp: 100.1 F (37.8 C) 99.5 F (37.5 C) 98.8 F (37.1 C) 98.4 F (36.9 C)  TempSrc: Oral  Oral Oral  Resp: $Remo'18  16 16  'vcLbF$ Height:      Weight:      SpO2: 98%  91% 96%    Last BM Date: 12/07/14  Intake/Output   Yesterday:  09/28 0701 - 09/29 0700 In: 45 [P.O.:240] Out: 860 [Urine:800; Drains:60] This shift:  Total I/O In: -  Out: 220 [Urine:200; Drains:20]  Bowel function:  Flatus: y  BM: y  Drain: serobilious  Physical Exam:  General: Pt awake/alert/oriented x4 in no acute distress Eyes: PERRL, normal EOM.  Sclera clear.  No icterus Neuro: CN II-XII intact w/o focal sensory/motor deficits. Lymph: No head/neck/groin lymphadenopathy Psych:  No delerium/psychosis/paranoia HENT: Normocephalic, Mucus membranes moist.  No thrush Neck: Supple, No tracheal deviation Chest: No chest wall pain w good excursion.  CTA B - breathing more easily CV:  Pulses intact.  Regular rhythm MS: Normal AROM mjr joints.  No obvious deformity Abdomen: Soft.  Nondistended.  Mildly tender at drain site only.  No evidence of peritonitis.  No incarcerated hernias. Ext:  SCDs BLE.  No mjr edema.  No cyanosis Skin: No petechiae / purpura  Results:   Labs: Results for orders placed or performed during the hospital encounter of 11/30/14 (from the past 48 hour(s))  CBC     Status: Abnormal   Collection Time: 12/07/14  5:19 AM  Result Value Ref Range   WBC 14.1 (H) 4.0 - 10.5 K/uL   RBC 3.91 3.87 - 5.11 MIL/uL   Hemoglobin 10.4 (L) 12.0 - 15.0 g/dL   HCT 32.6 (L) 36.0 - 46.0 %   MCV 83.4 78.0 - 100.0 fL   MCH 26.6 26.0 - 34.0 pg   MCHC 31.9 30.0 - 36.0 g/dL   RDW 15.6 (H) 11.5 - 15.5 %   Platelets 384 150 - 400 K/uL  Potassium     Status: Abnormal   Collection Time: 12/07/14  5:19 AM  Result Value Ref Range   Potassium  3.3 (L) 3.5 - 5.1 mmol/L  Magnesium     Status: None   Collection Time: 12/07/14  5:19 AM  Result Value Ref Range   Magnesium 2.2 1.7 - 2.4 mg/dL  CBC     Status: Abnormal   Collection Time: 12/08/14  2:57 AM  Result Value Ref Range   WBC 13.4 (H) 4.0 - 10.5 K/uL   RBC 3.70 (L) 3.87 - 5.11 MIL/uL   Hemoglobin 9.9 (L) 12.0 - 15.0 g/dL   HCT 31.0 (L) 36.0 - 46.0 %   MCV 83.8 78.0 - 100.0 fL   MCH 26.8 26.0 - 34.0 pg  MCHC 31.9 30.0 - 36.0 g/dL   RDW 15.8 (H) 11.5 - 15.5 %   Platelets 431 (H) 150 - 400 K/uL  Basic metabolic panel     Status: Abnormal   Collection Time: 12/08/14  2:57 AM  Result Value Ref Range   Sodium 137 135 - 145 mmol/L   Potassium 4.8 3.5 - 5.1 mmol/L    Comment: DELTA CHECK NOTED   Chloride 100 (L) 101 - 111 mmol/L   CO2 30 22 - 32 mmol/L   Glucose, Bld 118 (H) 65 - 99 mg/dL   BUN <5 (L) 6 - 20 mg/dL   Creatinine, Ser 0.58 0.44 - 1.00 mg/dL   Calcium 8.7 (L) 8.9 - 10.3 mg/dL   GFR calc non Af Amer >60 >60 mL/min   GFR calc Af Amer >60 >60 mL/min    Comment: (NOTE) The eGFR has been calculated using the CKD EPI equation. This calculation has not been validated in all clinical situations. eGFR's persistently <60 mL/min signify possible Chronic Kidney Disease.    Anion gap 7 5 - 15    Imaging / Studies: No results found.  Medications / Allergies: per chart  Antibiotics: Anti-infectives    Start     Dose/Rate Route Frequency Ordered Stop   12/06/14 0845  fluconazole (DIFLUCAN) tablet 200 mg     200 mg Oral Daily 12/06/14 0841 12/09/14 0959   12/01/14 2230  piperacillin-tazobactam (ZOSYN) IVPB 3.375 g     3.375 g 12.5 mL/hr over 240 Minutes Intravenous 3 times per day 12/01/14 2138     12/01/14 1315  fidaxomicin (DIFICID) tablet 200 mg     200 mg Oral 2 times daily 12/01/14 1304     11/30/14 1500  metroNIDAZOLE (FLAGYL) IVPB 500 mg  Status:  Discontinued     500 mg 100 mL/hr over 60 Minutes Intravenous Every 8 hours 11/30/14 0939 11/30/14 1400    11/30/14 1315  vancomycin (VANCOCIN) 50 mg/mL oral solution 500 mg  Status:  Discontinued     500 mg Oral 4 times per day 11/30/14 1312 11/30/14 1312   11/30/14 1230  fidaxomicin (DIFICID) tablet 200 mg  Status:  Discontinued     200 mg Oral 2 times daily 11/30/14 1227 12/01/14 1304   11/30/14 0630  metroNIDAZOLE (FLAGYL) IVPB 500 mg     500 mg 100 mL/hr over 60 Minutes Intravenous  Once 11/30/14 4132 11/30/14 0756        Note: Portions of this report may have been transcribed using voice recognition software. Every effort was made to ensure accuracy; however, inadvertent computerized transcription errors may be present.   Any transcriptional errors that result from this process are unintentional.     [name], M.D., F.A.C.S. Gastrointestinal and Minimally Invasive Surgery [facility] [address], [place],  [address] [phone] Main / Paging   12/08/2014  CARE TEAM:  PCP: Viviana Simpler, MD  Outpatient Care Team: Patient Care Team: Venia Carbon, MD as PCP - General Ladene Artist, MD as Consulting Physician (Gastroenterology) Michael Boston, MD as Consulting Physician (General Surgery) Carlyle Basques, MD as Consulting Physician (Infectious Diseases)  Inpatient Treatment Team: Treatment Team: Attending Provider: Verlee Monte, MD; Attending Physician: Theodis Blaze, MD; Registered Nurse: Suzan Nailer, RN; Registered Nurse: Donzetta Matters, RN; Technician: Francene Finders, NT; Registered Nurse: Roselind Rily, RN; Registered Nurse: Zachery Conch; Consulting Physician: Michael Boston, MD; Technician: Mort Sawyers,  NT; Technician: Army Chaco, NT; Technician: Wyline Copas, NT; Registered Nurse: Candida Peeling, RN; Registered Nurse: Roetta Sessions, RN; Consulting Physician: Carlyle Basques, MD; Rounding Team: Kelvin Cellar, MD; Technician: Maye Hides, NT (Inactive)

## 2014-12-08 NOTE — Progress Notes (Signed)
Calorie Count Note  48 hour calorie count ordered.  Diet: Heart Healthy Supplements: 30 ml Prostat TID  Breakfast: 381 kcals and 7 grams protein Lunch: n/a Dinner: n/a Supplements: n/a  Spoke with RN who reports that pt just received a late breakfast tray. She is tolerating the Prostat supplement well.  Nutrition Dx:  Inadequate oral intake related to altered GI function as evidenced by meal completion < 50%; progressing  Goal: Patient will meet greater than or equal to 90% of their needs  Intervention: -Continue 30 ml Prostat TID -F/u with calorie count results on 12/09/14  Jenifer A. Jimmye Norman, RD, LDN, CDE Pager: [phone] After hours Pager: [phone]

## 2014-12-09 ENCOUNTER — Inpatient Hospital Stay (HOSPITAL_COMMUNITY): Payer: BLUE CROSS/BLUE SHIELD

## 2014-12-09 DIAGNOSIS — E43 Unspecified severe protein-calorie malnutrition: Secondary | ICD-10-CM | POA: Diagnosis present

## 2014-12-09 DIAGNOSIS — K2970 Gastritis, unspecified, without bleeding: Secondary | ICD-10-CM

## 2014-12-09 LAB — RETICULOCYTES
RBC.: 4.08 MIL/uL (ref 3.87–5.11)
RETIC COUNT ABSOLUTE: 61.2 10*3/uL (ref 19.0–186.0)
Retic Ct Pct: 1.5 % (ref 0.4–3.1)

## 2014-12-09 LAB — BASIC METABOLIC PANEL
ANION GAP: 11 (ref 5–15)
BUN: 7 mg/dL (ref 6–20)
CALCIUM: 8.9 mg/dL (ref 8.9–10.3)
CHLORIDE: 96 mmol/L — AB (ref 101–111)
CO2: 29 mmol/L (ref 22–32)
Creatinine, Ser: 0.65 mg/dL (ref 0.44–1.00)
GFR calc non Af Amer: >60 mL/min (ref >60)
Glucose, Bld: 110 mg/dL — ABNORMAL HIGH (ref 65–99)
POTASSIUM: 4.1 mmol/L (ref 3.5–5.1)
Sodium: 136 mmol/L (ref 135–145)

## 2014-12-09 LAB — IRON AND TIBC
IRON: 29 ug/dL (ref 28–170)
Saturation Ratios: 12 % (ref 10.4–31.8)
TIBC: 248 ug/dL — AB (ref 250–450)
UIBC: 219 ug/dL

## 2014-12-09 LAB — HEPATIC FUNCTION PANEL
ALT: 151 U/L — AB (ref 14–54)
AST: 106 U/L — AB (ref 15–41)
Albumin: 2.2 g/dL — ABNORMAL LOW (ref 3.5–5.0)
Alkaline Phosphatase: 291 U/L — ABNORMAL HIGH (ref 38–126)
Bilirubin, Direct: 0.1 mg/dL (ref 0.1–0.5)
Indirect Bilirubin: 0.4 mg/dL (ref 0.3–0.9)
TOTAL PROTEIN: 7.2 g/dL (ref 6.5–8.1)
Total Bilirubin: 0.5 mg/dL (ref 0.3–1.2)

## 2014-12-09 LAB — VITAMIN B12: Vitamin B-12: 478 pg/mL (ref 180–914)

## 2014-12-09 LAB — FOLATE: FOLATE: 17.4 ng/mL (ref >5.9)

## 2014-12-09 LAB — FERRITIN: Ferritin: 490 ng/mL — ABNORMAL HIGH (ref 11–307)

## 2014-12-09 MED ORDER — PSYLLIUM 95 % PO PACK
1.0000 | PACK | Freq: Every day | ORAL | Status: DC
  Administered 2014-12-09: 1 via ORAL
  Filled 2014-12-09 (×2): qty 1

## 2014-12-09 MED ORDER — METHOCARBAMOL 500 MG PO TABS: 1000.0000 mg | ORAL_TABLET | Freq: Four times a day (QID) | ORAL | Status: DC | PRN

## 2014-12-09 MED ORDER — VANCOMYCIN 50 MG/ML ORAL SOLUTION
125.0000 mg | Freq: Four times a day (QID) | ORAL | Status: DC
  Filled 2014-12-09: qty 2.5

## 2014-12-09 MED ORDER — ACETAMINOPHEN 500 MG PO TABS
1000.0000 mg | ORAL_TABLET | Freq: Three times a day (TID) | ORAL | Status: DC
  Administered 2014-12-09 – 2014-12-10 (×4): 1000 mg via ORAL
  Filled 2014-12-09 (×4): qty 2

## 2014-12-09 MED ORDER — POTASSIUM CHLORIDE CRYS ER 20 MEQ PO TBCR
40.0000 meq | EXTENDED_RELEASE_TABLET | Freq: Once | ORAL | Status: AC
  Administered 2014-12-09: 40 meq via ORAL
  Filled 2014-12-09: qty 2

## 2014-12-09 MED ORDER — FAMOTIDINE 20 MG PO TABS
40.0000 mg | ORAL_TABLET | Freq: Two times a day (BID) | ORAL | Status: DC
  Administered 2014-12-09 – 2014-12-10 (×2): 40 mg via ORAL
  Filled 2014-12-09 (×2): qty 2

## 2014-12-09 NOTE — Progress Notes (Signed)
[facility] PROGRESS NOTE  Shelly Hood:[id number] DOB: 05/24/1955 DOA: 11/30/2014 PCP: Viviana Simpler, MD  HPI:  Shelly Hood is a 59 y.o. 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 The patient has had C. difficile colitis since April 20 16th. 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 Diarrhea recurred and she was placed on vancomycin on 8/22. She required a second course of vancomycin that was started on 9/14. She presented to the hospital with right upper quadrant pain. She states that she had had ongoing diarrhea prior to presentation to the hospital. Imaging revealed right upper quadrant fluid collection. Right upper quadrant drain placed by IR. The patient continued to have fevers subsequent to this. On admission she had a small right-sided pleural effusion. It was noted that she was slowly becoming more hypoxic and had decreased breath sounds in right middle lung field and therefore a repeat x-ray was done which revealed a now moderate sized right sided pleural effusion. IR guided thoracentesis performed on 9/26.  Subjective: Appears to be okay, denies any fever, shortness of breath or other complaints. Patient is very concerned about her PICC line and her abdominal drain, questions that she will be able to take care of that at home. Patient reported that her husband has multiple health issues himself and cannot help. Daughter spoke with Dr. Graylon Good and requested evaluation for SNF. PT/OT to evaluate and treat.  Assessment/Plan: Sepsis - Evident with fever of 101.8 and WBC of 15.4 and evidence of infection. - Likely secondary to perihepatic abscess and C. difficile colitis. - Currently afebrile, leukocytosis improving (13.4). This is resolved after  aggressive hydration with IV fluids and IV antibiotics.  Clostridium difficile colitis - Started on the Fidaxomycin by ID day 9/10 - Diarrhea continues to improve. Getting better. - Continue current treatment with Dificid and probiotics.  Perihepatic abscesses - Discovered on CT scan - -IR consulted for drainage- RUQ drain placed on 9/23 - Cont Zosyn - -Follow up cultures- ID assisting with management - Intermittenlty spiked fevers but temperature curve is improving currently afebrile - History of gastric ulcer resulting in a stricture in outlet obstruction status post s/p robotic truncal vagotomy, distal gastrectomy, roux-en-y and hiatal hernia repair by Dr. Johney Maine on 06/24/14,  - Question of whether she may have had a gastric perforation or leaking anastomosis which resulted in abscesses- Upper GI with Gastrograffin does not reveal a leak - Drainage catheter injected 9/26 to look for leak-no fistulous connection to bowel sound - Tolerating diet, PICC line placed.  Acute hypoxic respiratory failure-right-sided pleural effusion - 9/26 Thoracentesis of moderate right-sided pleural effusion-possibly reactive from above-mentioned abscesses - Fluid is exudative-pulmonary consulted for their opinion on whether this is reactive or infectious-they feel it is reactive as well-we'll need to continue to monitor for re-collection - 2-D echo ordered to determine if she has underlying heart failure- this reveals that she has a normal EF and grade 1 diastolic dysfunction. - Continue to wean oxygen as able, remains at 96% on [facility]  Elevated LFTs -Possibly in relation to perihepatic abscess-improved   Anorexia - Diet advanced yesterday, patient has been able to eat soft foods (yogurt, beans) without complications - Burning with acidic/salty foods, recommended to keep foods soft/bland    Depression/anxiety - Continue Prozac and Xanax  Thrush  -  Patient complaining of symptoms today although  previously improving with nystatin - Diflucan added by surgery yesterday without improvement  - Will add GI cocktail to try and relieve symptoms   Hypokalemia - Likely secondary to diarrhea (K: 2.8, Mg: 2.2) - Repleted yesterday with oral supplements, WNL today (K: 4.8)   Code Status: Full  Family Communication: None at bedside Disposition Plan: Home when stable  DVT Prophylaxis: Lovenox   Consultants:  GI  ID  IR  General Surgery   Pulmonary   Procedures:  12/02/14: RUQ drain by IR   12/05/14: R sided thoracentesis by IR   12/05/14: 2-D Echo   Antibiotics: Anti-infectives    Start     Dose/Rate Route Frequency Ordered Stop   12/08/14 1400  fluconazole (DIFLUCAN) tablet 200 mg     200 mg Oral Daily 12/08/14 1247 12/11/14 0959   12/06/14 0845  fluconazole (DIFLUCAN) tablet 200 mg     200 mg Oral Daily 12/06/14 0841 12/08/14 1002   12/01/14 2230  piperacillin-tazobactam (ZOSYN) IVPB 3.375 g     3.375 g 12.5 mL/hr over 240 Minutes Intravenous 3 times per day 12/01/14 2138     12/01/14 1315  fidaxomicin (DIFICID) tablet 200 mg     200 mg Oral 2 times daily 12/01/14 1304     11/30/14 1500  metroNIDAZOLE (FLAGYL) IVPB 500 mg  Status:  Discontinued     500 mg 100 mL/hr over 60 Minutes Intravenous Every 8 hours 11/30/14 0939 11/30/14 1400   11/30/14 1315  vancomycin (VANCOCIN) 50 mg/mL oral solution 500 mg  Status:  Discontinued     500 mg Oral 4 times per day 11/30/14 1312 11/30/14 1312   11/30/14 1230  fidaxomicin (DIFICID) tablet 200 mg  Status:  Discontinued     200 mg Oral 2 times daily 11/30/14 1227 12/01/14 1304   11/30/14 0630  metroNIDAZOLE (FLAGYL) IVPB 500 mg     500 mg 100 mL/hr over 60 Minutes Intravenous  Once 11/30/14 0626 11/30/14 0756        Objective: Filed Vitals:   12/09/14 0503  BP: 103/48  Pulse: 72  Temp: 97.7 F (36.5 C)  Resp: 17    Intake/Output Summary (Last 24 hours) at 12/09/14 1147 Last data filed at 12/09/14 0900  Gross per  24 hour  Intake    700 ml  Output     25 ml  Net    675 ml   Filed Weights   11/30/14 0105  Weight: 63.192 kg (139 lb 5 oz)    Exam:   General:  Well-appearing woman, sitting up in bed with no acute distress  Cardiovascular: RRR, no m/r/g. No JVD or peripheral edema  Respiratory: CTA b/l, no wheeze or crackles  Abdomen: Non-distended, non-tender. +BS. Drain in RUQ with appropriate drainage. Firm in area surrounding drain  Musculoskeletal: Appropriate movement of UE/LE. No cyanosis or clubbing   Neuro: A&Ox3. No focal neurological deficits   Data Reviewed: Basic Metabolic Panel:  Recent Labs Lab 12/03/14 0422 12/05/14 1710 12/06/14 0024 12/07/14 0519 12/08/14 0257 12/09/14 0523  NA 135 135 136  --  137 136  K 3.6 2.8* 2.8* 3.3* 4.8 4.1  CL 103 98* 98*  --  100* 96*  CO2 24 27 30   --  30 29  GLUCOSE 126* 105* 113*  --  118* 110*  BUN 9 <5* <5*  --  <5* 7  CREATININE 0.68 0.54 0.53  --  0.58 0.65  CALCIUM 7.9* 7.8*  8.0*  --  8.7* 8.9  MG  --   --  2.0 2.2  --   --    Liver Function Tests:  Recent Labs Lab 12/03/14 1219 12/05/14 1710 12/06/14 0024  AST  --  186* 112*  ALT  --  154* 134*  ALKPHOS  --  226* 213*  BILITOT 0.9 0.5 0.7  PROT  --  5.5* 5.6*  ALBUMIN  --  1.8* 1.9*    Recent Labs Lab 12/03/14 1219 12/06/14 0024  LIPASE  --  24  AMYLASE 53  --    No results for input(s): AMMONIA in the last 168 hours. CBC:  Recent Labs Lab 12/04/14 0402 12/05/14 0751 12/06/14 0024 12/07/14 0519 12/08/14 0257  WBC 15.4* 14.9* 14.9* 14.1* 13.4*  HGB 10.0* 10.3* 10.1* 10.4* 9.9*  HCT 30.6* 30.8* 30.0* 32.6* 31.0*  MCV 83.8 82.4 81.7 83.4 83.8  PLT 239 258 279 384 431*   Cardiac Enzymes: No results for input(s): CKTOTAL, CKMB, CKMBINDEX, TROPONINI in the last 168 hours. BNP (last 3 results) No results for input(s): BNP in the last 8760 hours.  ProBNP (last 3 results) No results for input(s): PROBNP in the last 8760 hours.  CBG: No results  for input(s): GLUCAP in the last 168 hours.  Recent Results (from the past 240 hour(s))  Culture, routine-abscess     Status: None   Collection Time: 12/02/14  6:32 PM  Result Value Ref Range Status   Specimen Description ABSCESS ABDOMEN  Final   Special Requests NONE  Final   Gram Stain   Final    NO WBC SEEN NO SQUAMOUS EPITHELIAL CELLS SEEN NO ORGANISMS SEEN Performed at [facility]    Culture   Final    MULTIPLE ORGANISMS PRESENT, NONE PREDOMINANT Note: NO STAPHYLOCOCCUS AUREUS ISOLATED NO GROUP A STREP (S.PYOGENES) ISOLATED Performed at [facility]    Report Status 12/06/2014 FINAL  Final  Culture, body fluid-bottle     Status: None (Preliminary result)   Collection Time: 12/05/14 11:15 AM  Result Value Ref Range Status   Specimen Description FLUID RIGHT PLEURAL  Final   Special Requests BAA 10CCS  Final   Culture NO GROWTH 3 DAYS  Final   Report Status PENDING  Incomplete  Gram stain     Status: None   Collection Time: 12/05/14 11:15 AM  Result Value Ref Range Status   Specimen Description FLUID RIGHT PLEURAL  Final   Special Requests NONE  Final   Gram Stain   Final    MODERATE WBC PRESENT,BOTH PMN AND MONONUCLEAR NO ORGANISMS SEEN    Report Status 12/05/2014 FINAL  Final     Studies: No results found.  Scheduled Meds: . ALPRAZolam  0.25 mg Oral Daily  . antiseptic oral rinse  7 mL Mouth Rinse BID  . atorvastatin  20 mg Oral q1800  . famotidine  20 mg Oral BID  . feeding supplement (PRO-STAT SUGAR FREE 64)  30 mL Oral TID BM  . fidaxomicin  200 mg Oral BID  . fluconazole  200 mg Oral Daily  . FLUoxetine  40 mg Oral BID  . lip balm   Topical BID  . multivitamins with iron  1 tablet Oral Daily  . nystatin  5 mL Oral QID  . piperacillin-tazobactam (ZOSYN)  IV  3.375 g Intravenous 3 times per day  . saccharomyces boulardii  250 mg Oral BID  . 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

## 2014-12-09 NOTE — Clinical Social Work Note (Signed)
CSW received referral for SNF.  Case discussed with patient, family, and physician, and plan is to discharge home.  Patient expressed that she does not want to go to a SNF for her IV medications.  Case manager made aware, CSW to sign off please re-consult if social work needs arise.  Jones Broom. [place], MSW, [place]

## 2014-12-09 NOTE — Progress Notes (Addendum)
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 female  Patient Care Team: Venia Carbon, MD as PCP - General Ladene Artist, MD as Consulting Physician (Gastroenterology) Michael Boston, MD as Consulting Physician (General Surgery)  PRE-OPERATIVE DIAGNOSIS: Partial Gastric Outlet Obstruction  POST-OPERATIVE DIAGNOSIS: Near complete Gastric Outlet Obstruction  PROCEDURE:  ROBOTIC TRUNCAL VAGOTOMY  ROBOTIC DISTAL GASTRECTOMY ROBOTIC ROUX-EN-Y CONSTRUCTION ROBOTIC HIATAL HERNIA REPAIR Omentopexy of duodenum Omentopexy of Gastrojejunostomy  SURGEON: Surgeon(s): Michael Boston, MD    Assessment  RUQ collection ? abscess w/o definite leak - improving s/p perc drainage & IV Abx.  Cxs MForganisms.  No proof of leak  Gastritis   Hypoxia w effusion - improved after thoracentesis but recurrent  SEVERE MALNUTRITION  Plan:  POSSIBLE LEAK - controlled & sealed for now -UGI not c/w leak -green output concerning for leak but drain output with normal bilirubin & amylase = not c/w leak -I suspect that she had a delayed duodenal bulb leak that is closed right now. -keep drain & do outpatient fistula study in 1-2 weeks at the interventional radiology outpatient drain clinic.  Pt thinks it is next Tuesday but I  cannot see the order.  If no fistula, no significant cavity,&  output less than 30 mL a day; then, Remove the drain in outpatient clinic.  Post-gastrectomy type diet.   5-6 small meals w liquids & solids separated to help avoid dumping. Try supplements - perhaps more solid options since shakes cause dumping.  Kinda like a bypass but really has >50% stomach left.  Nutrition help appreciated  I again stressed to her that she needs to eat and get enough protein to avoid staying malnourished.  Try and break the cycle of infection and malnutrition.   No evid stricture/obstruction/leak Pt denies nausea She remains ?? motivated to improve Family foods PRN - OK by me.  They do want to give her lots of shakes though - not a great idea w diarrhea Appreciate Nutrition help PO supp protein intake better - there is hope Pt wondering "can't I just get IV nutrition?"  I rec NJ feeding tube over that - she declines & will retry eating  -high dose acid blockade for presumed gastritis - switched off PPI given h/o recurrent CDiff.  H2B not as good but better than nothing I guess.  Consider EGD to eval gastritis, r/o stricture/Hpylori since no fistula confirmed on drain study Mon.  H.pylori negative in past.   GI signed off again...  -Gastrin level w recurrent gastrits after V&Antrectomy = 14 = WNL = gastrinoma unlikely No evid of vit B12/folate deficiency.  Cont vit C Cont MVI w iron Cont PO supp -? Pt c/o thrush / odynophagia per pt - fluc x 3 d (ID / IM can overrule) - Magic mouthwash helps.  Ask  GI input if not better  -no good evid of colitis on CT scan = ?CDiff colonization & not colitis ?? - defer to ID.  Cont fidaxomicin  -chronic diarrhea for years = reconsider etiology as not just C Diff.  Diarrhea w Ensure = Teach anti dumping diet  -cont ABx - f/u cultures.  WBC flat & pain less so stick w pip/tazo only if ID agrees.   X 14d IV ABx per ID.  -thoracentesis for new RLL effusion - C&S neg  so far.   Recurrent  Hypoxia w normal ECHO - recheck CXR  -hypoK - corrected - hold off on supp w K 3.3 to 4.5 this AM.  Defer to medicine  I updated the patient's status to the patient.  Have not seen any other family members for the past few days but did discuss the major plan several days before with husband and ?daughter present.  Recommendations were made.  Questions were answered.  The patient expressed understanding & appreciation.   D/C patient from hospital when patient meets criteria (anticipate in 1-2 day(s)):  Tolerating oral intake well Ambulating well Adequate pain control without IV medications Urinating  Having flatus Disposition planning in place - drain care/teaching.  Family very concerned about pt going home w PICC & drain & possible oxygen.  Explain CSW/CM evals w HH - sytill very worried.  Will look at SNF - defer to primary IMed service  I updated the patient's status to the patient & the patient's spouse, daughter, and family.  Also with CSW.  Recommendations were made.  Questions were answered.  They expressed understanding & appreciation.  Surgical issues stable.  I will be available next week.  PRN until then    [name], M.D., F.A.C.S. Gastrointestinal and Minimally Invasive Surgery [facility] [address] [place], [place] [address] [phone] Main / Paging   12/09/2014  Subjective:  Pain low but doesn't like to get up or breathe because it hurts - wont take pain meds Back on oxygen N/D with ensure - trying other supp.  No more diarrhea No V/bloating/emesis Still w sore tongue - MM helps but not fluconazole No HB/reflux. No belching No dysphagia   Objective:  Vital signs:  Filed Vitals:   12/08/14 0630 12/08/14 1500 12/08/14 2212 12/09/14 0503  BP: 110/52 126/53 100/57 103/48  Pulse: 69 79 69 72  Temp: 98.4 F (36.9 C) 99 F (37.2 C)  97.7 F (36.5 C)  TempSrc: Oral Oral Oral Oral  Resp: $Remo'16 20 18  17  'jETCV$ Height:      Weight:      SpO2: 96% 97% 95% 92%    Last BM Date: 12/08/14  Intake/Output   Yesterday:  09/29 0701 - 09/30 0700 In: 460 [P.O.:460] Out: 25 [Drains:25] This shift:     Bowel function:  Flatus: y  BM: y  Drain: serous  Physical Exam:  General: Pt awake/alert/oriented x4 in no acute distress Eyes: PERRL, normal EOM.  Sclera clear.  No icterus Neuro: CN II-XII intact w/o focal sensory/motor deficits. Lymph: No head/neck/groin lymphadenopathy Psych:  No delerium/psychosis/paranoia.  Anxious but somewhat consolable HENT: Normocephalic, Mucus membranes moist.  No thrush Neck: Supple, No tracheal deviation Chest: No chest wall pain w good excursion.  CTA B - breathing more easily CV:  Pulses intact.  Regular rhythm MS: Normal AROM mjr joints.  No obvious deformity Abdomen: Soft.  Nondistended.  Mildly tender at drain site only.  Serous.  No evidence of peritonitis.  No incarcerated hernias. Ext:  SCDs BLE.  No mjr edema.  No cyanosis Skin: No petechiae / purpura  Results:   Labs: Results for orders placed or performed during the hospital encounter of 11/30/14 (from the past 48 hour(s))  CBC     Status: Abnormal   Collection Time: 12/08/14  2:57 AM  Result Value Ref Range   WBC 13.4 (H) 4.0 - 10.5 K/uL   RBC 3.70 (L) 3.87 - 5.11 MIL/uL   Hemoglobin 9.9 (L) 12.0 - 15.0 g/dL   HCT 31.0 (L) 36.0 - 46.0 %   MCV 83.8 78.0 - 100.0 fL   MCH 26.8 26.0 - 34.0 pg   MCHC 31.9 30.0 - 36.0 g/dL   RDW 15.8 (H) 11.5 - 15.5 %   Platelets 431 (H) 150 - 400 K/uL  Basic metabolic panel     Status: Abnormal   Collection Time: 12/08/14  2:57 AM  Result Value Ref Range   Sodium 137 135 - 145 mmol/L   Potassium 4.8 3.5 - 5.1 mmol/L    Comment: DELTA CHECK NOTED   Chloride 100 (L) 101 - 111 mmol/L   CO2 30 22 - 32 mmol/L   Glucose, Bld 118 (H) 65 - 99 mg/dL   BUN <5 (L) 6 - 20 mg/dL   Creatinine, Ser 0.58 0.44 - 1.00 mg/dL   Calcium 8.7 (L) 8.9 - 10.3 mg/dL   GFR  calc non Af Amer >60 >60 mL/min   GFR calc Af Amer >60 >60 mL/min    Comment: (NOTE) The eGFR has been calculated using the CKD EPI equation. This calculation has not been validated in all clinical situations. eGFR's persistently <60 mL/min signify possible Chronic Kidney Disease.    Anion gap 7 5 - 15  Basic metabolic panel     Status: Abnormal   Collection Time: 12/09/14  5:23 AM  Result Value Ref Range   Sodium 136 135 - 145 mmol/L   Potassium 4.1 3.5 - 5.1 mmol/L   Chloride 96 (L) 101 - 111 mmol/L   CO2 29 22 - 32 mmol/L   Glucose, Bld 110 (H) 65 - 99 mg/dL   BUN 7 6 - 20 mg/dL   Creatinine, Ser 0.65 0.44 - 1.00 mg/dL   Calcium 8.9 8.9 - 10.3 mg/dL   GFR calc non Af Amer >60 >60 mL/min   GFR calc Af Amer >60 >60 mL/min    Comment: (NOTE) The eGFR has been calculated using the CKD EPI equation. This calculation has not been validated in all clinical situations. eGFR's persistently <60 mL/min signify possible Chronic Kidney Disease.    Anion gap 11 5 - 15  Vitamin B12     Status: None   Collection Time: 12/09/14  5:23 AM  Result Value Ref Range   Vitamin B-12 478 180 - 914 pg/mL    Comment: (NOTE) This assay is not validated for testing neonatal or myeloproliferative syndrome specimens for Vitamin B12 levels.   Folate     Status: None   Collection Time: 12/09/14  5:23 AM  Result Value Ref Range   Folate 17.4 >5.9 ng/mL  Iron and TIBC     Status: Abnormal   Collection Time: 12/09/14  5:23 AM  Result Value Ref Range   Iron 29 28 - 170 ug/dL   TIBC 248 (L) 250 - 450 ug/dL   Saturation Ratios 12 10.4 - 31.8 %   UIBC 219 ug/dL  Ferritin  Status: Abnormal   Collection Time: 12/09/14  5:23 AM  Result Value Ref Range   Ferritin 490 (H) 11 - 307 ng/mL  Reticulocytes     Status: None   Collection Time: 12/09/14  5:23 AM  Result Value Ref Range   Retic Ct Pct 1.5 0.4 - 3.1 %   RBC. 4.08 3.87 - 5.11 MIL/uL   Retic Count, Manual 61.2 19.0 - 186.0 K/uL    Imaging /  Studies: No results found.  Medications / Allergies: per chart  Antibiotics: Anti-infectives    Start     Dose/Rate Route Frequency Ordered Stop   12/08/14 1400  fluconazole (DIFLUCAN) tablet 200 mg     200 mg Oral Daily 12/08/14 1247 12/11/14 0959   12/06/14 0845  fluconazole (DIFLUCAN) tablet 200 mg     200 mg Oral Daily 12/06/14 0841 12/08/14 1002   12/01/14 2230  piperacillin-tazobactam (ZOSYN) IVPB 3.375 g     3.375 g 12.5 mL/hr over 240 Minutes Intravenous 3 times per day 12/01/14 2138     12/01/14 1315  fidaxomicin (DIFICID) tablet 200 mg     200 mg Oral 2 times daily 12/01/14 1304     11/30/14 1500  metroNIDAZOLE (FLAGYL) IVPB 500 mg  Status:  Discontinued     500 mg 100 mL/hr over 60 Minutes Intravenous Every 8 hours 11/30/14 0939 11/30/14 1400   11/30/14 1315  vancomycin (VANCOCIN) 50 mg/mL oral solution 500 mg  Status:  Discontinued     500 mg Oral 4 times per day 11/30/14 1312 11/30/14 1312   11/30/14 1230  fidaxomicin (DIFICID) tablet 200 mg  Status:  Discontinued     200 mg Oral 2 times daily 11/30/14 1227 12/01/14 1304   11/30/14 0630  metroNIDAZOLE (FLAGYL) IVPB 500 mg     500 mg 100 mL/hr over 60 Minutes Intravenous  Once 11/30/14 3662 11/30/14 0756        Note: Portions of this report may have been transcribed using voice recognition software. Every effort was made to ensure accuracy; however, inadvertent computerized transcription errors may be present.   Any transcriptional errors that result from this process are unintentional.     [name], M.D., F.A.C.S. Gastrointestinal and Minimally Invasive Surgery [facility] [address], [place], [place] [address] [phone] Main / Paging   12/09/2014  CARE TEAM:  PCP: Viviana Simpler, MD  Outpatient Care Team: Patient Care Team: Venia Carbon, MD as PCP - General Ladene Artist, MD as Consulting Physician (Gastroenterology) Michael Boston, MD as  Consulting Physician (General Surgery) Carlyle Basques, MD as Consulting Physician (Infectious Diseases)  Inpatient Treatment Team: Treatment Team: Attending Provider: Verlee Monte, MD; Attending Physician: Theodis Blaze, MD; Registered Nurse: Suzan Nailer, RN; Registered Nurse: Donzetta Matters, RN; Technician: Francene Finders, NT; Registered Nurse: Roselind Rily, RN; Registered Nurse: Zachery Conch; Consulting Physician: Michael Boston, MD; Technician: Mort Sawyers, NT; Technician: Army Chaco, NT; Registered Nurse: Candida Peeling, RN; Registered Nurse: Roetta Sessions, RN; Consulting Physician: Carlyle Basques, MD; Rounding Team: Kelvin Cellar, MD; Technician: Ronalee Red, NT; Registered Nurse: Westly Pam, RN; Registered Nurse: Rosalva Ferron, RN

## 2014-12-09 NOTE — Progress Notes (Signed)
Calorie Count Note  48 hour calorie count ordered.  Diet: Heart Healthy Supplements: 30 ml Prostat TID  Spoke with RN, who reports pt did fair yesterday with meal trays. She did not each much breakfast this AM. Per RN, pt is fairly selective about what she eats (suspect due to fear of diarrhea). She continues to take her Prostat supplements well.   Day 1  Breakfast: 381 kcals and 7 grams protein Lunch: 171 kcals and 17 grams protein Dinner: 133 kcals and 7 grams protein Supplements: 30 ml Prostat TID (300 kcals and 45 grams protein)  Total intake: 985 kcal (58% of minimum estimated needs)  76 protein (95% of minimum estimated needs)  Day 2  Breakfast: 93 kcals and 6 grams protein Lunch: n/a Dinner: n/a Supplements: 30 ml Prostat this AM thus far (100 kcals and 15 grams protein)  Nutrition Dx: Inadequate oral intake related to altered GI function as evidenced by meal completion < 50%; progressing  Goal: Patient will meet greater than or equal to 90% of their needs; progressing  Intervention:  -Continue with 30 ml Prostat TID -F/u with calorie count results on Monday, 12/12/14  Jenifer A. Jimmye Norman, RD, LDN, CDE Pager: [phone] After hours Pager: [phone]

## 2014-12-09 NOTE — Progress Notes (Signed)
Pharmacy Antibiotic Time-Out Note  Shelly Hood is a 59 y.o. year-old female admitted on 11/30/2014.  The patient is currently on Zosyn, fidaxomicin, and fluconazole for perihepatic abscess, possible thrush and possible CDiff.   Recent Labs Lab 12/04/14 0402 12/05/14 0751 12/06/14 0024 12/07/14 0519 12/08/14 0257  WBC 15.4* 14.9* 14.9* 14.1* 13.4*    Recent Labs Lab 12/03/14 0422 12/05/14 1710 12/06/14 0024 12/08/14 0257 12/09/14 0523  CREATININE 0.68 0.54 0.53 0.58 0.65   Estimated Creatinine Clearance: 63.5 mL/min (by C-G formula based on Cr of 0.65). Tmax/24h 99  Antimicrobial allergies: cefotetan - thrombocytopenia, metronidazole intolerance  Antimicrobials this admission: Dificid 9/21 >>10/1 Zosyn 9/22 >> Fluconazole 9/30>>10/2 PO Vanc PTA - resume 10/2 to prevent relapse while on Zosyn   Microbiology Results: 9/23 abd abscess - neg 9/26 body fluid cx - ngtd  Assessment:  o After discussion with Dr. Baxter Flattery, MD, the length of therapy for fidaxomidin for Cdiff is  10 days. The stop date is entered and will be 10/1.  Will convert to oral vancomycin on 10/2.  Plan:  Stop fidaxomicin tomorrow and start vancomycin 125mg  po QID on 10/2  Thank you for allowing pharmacy to be a part of this patient's care.  Candie Mile PharmD 12/09/2014 1:19 PM

## 2014-12-09 NOTE — Care Management Note (Addendum)
Case Management Note  Patient Details  Name: Shelly Hood MRN: [id number] Date of Birth: 10-29-1955  Subjective/Objective:                    Action/Plan:  Spoke to patient and family at bedside , explained home health and confirmed face sheet information .   Patient has walker at home   Nurse will check oxygen sats to see if home oxygen needed.  Patient will need a prescription for normal saline flushes .  Expected Discharge Date:                  Expected Discharge Plan:  [place]  In-House Referral:     Discharge planning Services  CM Consult  Post Acute Care Choice:  Home Health Choice offered to:  Patient  DME Arranged:    DME Agency:     HH Arranged:  RN, PT, IV Antibiotics HH Agency:  [facility]  Status of Service:  In process, will continue to follow  Medicare Important Message Given:    Date Medicare IM Given:    Medicare IM give by:    Date Additional Medicare IM Given:    Additional Medicare Important Message give by:     If discussed at [place] of Stay Meetings, dates discussed:    Additional Comments:  Marilu Favre, RN 12/09/2014, 2:17 PM

## 2014-12-09 NOTE — Discharge Instructions (Signed)
TAKE ANTIACID MEDICATION FOR THE REST OF YOUR LIFE (PPI like Nexium)  DRAIN CARE:   You have a closed bulb drain to help you heal.  A bulb drain is a small, plastic reservoir which creates a gentle suction. It is used to remove excess fluid from a surgical wound. The color and amount of fluid will vary. Immediately after surgery, the fluid is bright red. It may gradually change to a yellow color. When the amount decreases to about 1 or 2 tablespoons (15 to 30 cc) per 24 hours, your caregiver will usually remove it.  DAILY CARE  Keep the bulb compressed at all times, except while emptying it. The compression creates suction.   Keep sites where the tubes enter the skin dry and covered with a light bandage (dressing).   Tape the tubes to your skin, 1 to 2 inches below the insertion sites, to keep from pulling on your stitches. Tubes are stitched in place and will not slip out.   Pin the bulb to your shirt (not to your pants) with a safety pin.   For the first few days after surgery, there usually is more fluid in the bulb. Empty the bulb whenever it becomes half full because the bulb does not create enough suction if it is too full. Include this amount in your 24 hour totals.   When the amount of drainage decreases, empty the bulb at the same time every day. Write down the amounts and the 24 hour totals. Your caregiver will want to know them. This helps your caregiver know when the tubes can be removed.   (We anticipate removing the drain in 1-3 weeks, depending on when the output is <50mL a day for 2+ days)  If there is drainage around the tube sites, change dressings and keep the area dry. If you see a clot in the tube, leave it alone. However, if the tube does not appear to be draining, let your caregiver know.  TO EMPTY THE BULB  Open the stopper to release suction.   Holding the stopper out of the way, pour drainage into the measuring cup that was sent home with you.   Measure and  write down the amount. If there are 2 bulbs, note the amount of drainage from bulb 1 or bulb 2 and keep the totals separate. Your caregiver will want to know which tube is draining more.   Compress the bulb by folding it in half.   Replace the stopper.   Check the tape that holds the tube to your skin, and pin the bulb to your shirt.  SEEK MEDICAL CARE IF:  The drainage develops a bad odor.   You have an oral temperature above 102 F (38.9 C).   The amount of drainage from your wound suddenly increases or decreases.   You accidentally pull out your drain.   You have any other questions or concerns.  MAKE SURE YOU:   Understand these instructions.   Will watch your condition.   Will get help right away if you are not doing well or get worse.     Call our office if you have any questions about your drain. [phone]    Dumping Syndrome Diet Dumping syndrome is term used to describe a group of symptoms that occur when the stomach empties too quickly. Dumping syndrome usually happens after a stomach surgery or gastric bypass surgery for weight loss. The symptoms may occur 10 minutes to 3 hours after eating.  SYMPTOMS  Symptoms of the stomach emptying too quickly are:  Abdominal cramps.  Abdominal pain.  Nausea.  Feeling dizzy.  Feeling weak.  High heart rate.  Sweating. These symptoms can be controlled by changing the type of food you eat. Avoiding sweet and fatty foods can be helpful. Foods that are too sweet are known as concentrated sweets.  CHOOSING FOODS  Breads and Starches. These should be limited in general, depending on type of gastric surgery.  Allowed: Whole wheat bread, brown rice, whole grain pasta, corn, beans, potatoes without added fat.  Avoid: Sweet rolls, pastries, sugary cereals of any type. Vegetables  Allowed: Fresh, frozen, and canned vegetables.  Avoid: Creamed or breaded vegetables. Vegetables in a cheese sauce. Fruit  Allowed:  All fresh, canned in natural juice or light syrup, or frozen fruits.  Avoid: Canned fruit in heavy syrup, commercially prepared fruit smoothies, fruit juice. Meat and Meat Substitutes  Allowed: Plain beef, chicken, fish, [place], lamb, veal, pork, ham. Eggs, soy meat substitutes, nuts.  Avoid: Creamed, breaded or fried meat, fish, or fowl. Full-fat sausage products. Milk  Allowed: Low-fat or fat-free milk, low-fat plain yogurt or light yogurt products.  Avoid: Milk (whole, 2%). Half and half, heavy cream, whipping cream. Soups and Combination Foods  Allowed: Bouillon, broth, vegetable soups, clear soups, consomms. Homemade soups made with low-fat ingredients  Avoid: Cream soups, chowders. Macaroni and cheese, pizza. Desserts and Sweets  Allowed: Sugar-free gelatin, sugar-free pudding.  Avoid: All desserts including sugar-free pies, cakes, candies, cookies. Fats and Oils  Allowed: Small amounts of added fat (1 tsp) including butter, margarines, dressings, vegetable oils, shortening, mayonnaise, sugar-free nondairy cream. Reduced-fat cream cheese, low-fat sour cream, peanut butter (1 tbs).  Avoid: Bacon, cream, cream cheese, sour cream, fried foods. Beverages  Allowed: Sugar-free flavored water, decaf unsweetened tea and coffee. Sugar-free hot chocolate.  Avoid: Hot chocolate. Regular sugar and diet carbonated beverages, juice, any other regular sugar sweetened beverages Condiments  Allowed: Sugar substitutes, sugar-free jam and jelly, sugar-free syrup.  Avoid: Honey, sugar, syrup, jam, jelly, chocolate syrup, molasses, brown sugar, corn syrup, agave nectar. Document Released: 02/14/2011 Document Revised: 05/20/2011 Document Reviewed: 02/14/2011 [facility] Patient Information 2015 [place], [place]. This information is not intended to replace advice given to you by your health care provider. Make sure you discuss any questions you have with your health care provider.    GETTING  TO GOOD BOWEL HEALTH. Irregular bowel habits such as constipation and diarrhea can lead to many problems over time.  Having one soft bowel movement a day is the most important way to prevent further problems.  The anorectal canal is designed to handle stretching and feces to safely manage our ability to get rid of solid waste (feces, poop, stool) out of our body.  BUT, hard constipated stools can act like ripping concrete bricks and diarrhea can be a burning fire to this very sensitive area of our body, causing inflamed hemorrhoids, anal fissures, increasing risk is perirectal abscesses, abdominal pain/bloating, an making irritable bowel worse.      The goal: ONE SOFT BOWEL MOVEMENT A DAY!  To have soft, regular bowel movements:   Drink plenty of fluids, consider 4-6 tall glasses of water a day.    Take plenty of fiber.  Fiber is the undigested part of plant food that passes into the colon, acting s natures broom to encourage bowel motility and movement.  Fiber can absorb and hold large amounts of water. This results in a larger, bulkier stool, which is  soft and easier to pass. Work gradually over several weeks up to 6 servings a day of fiber (25g a day even more if needed) in the form of: o Vegetables -- Root (potatoes, carrots, turnips), leafy green (lettuce, salad greens, celery, spinach), or cooked high residue (cabbage, broccoli, etc) o Fruit -- Fresh (unpeeled skin & pulp), Dried (prunes, apricots, cherries, etc ),  or stewed ( applesauce)  o Whole grain breads, pasta, etc (whole wheat)  o Bran cereals   Bulking Agents -- This type of water-retaining fiber generally is easily obtained each day by one of the following:  o Psyllium bran -- The psyllium plant is remarkable because its ground seeds can retain so much water. This product is available as Metamucil, Konsyl, Effersyllium, Per Diem Fiber, or the less expensive generic preparation in drug and health food stores. Although labeled a  laxative, it really is not a laxative.  o Methylcellulose -- This is another fiber derived from wood which also retains water. It is available as Citrucel. o Polyethylene Glycol - and artificial fiber commonly called Miralax or Glycolax.  It is helpful for people with gassy or bloated feelings with regular fiber o Flax Seed - a less gassy fiber than psyllium  No reading or other relaxing activity while on the toilet. If bowel movements take longer than 5 minutes, you are too constipated  AVOID CONSTIPATION.  High fiber and water intake usually takes care of this.  Sometimes a laxative is needed to stimulate more frequent bowel movements, but   Laxatives are not a good long-term solution as it can wear the colon out.  They can help jump-start bowels if constipated, but should be relied on constantly without discussing with your doctor o Osmotics (Milk of Magnesia, Fleets phosphosoda, Magnesium citrate, MiraLax, GoLytely) are safer than  o Stimulants (Senokot, Castor Oil, Dulcolax, Ex Lax)    o Avoid taking laxatives for more than 7 days in a row.   IF SEVERELY CONSTIPATED, try a Bowel Retraining Program: o Do not use laxatives.  o Eat a diet high in roughage, such as bran cereals and leafy vegetables.  o Drink six (6) ounces of prune or apricot juice each morning.  o Eat two (2) large servings of stewed fruit each day.  o Take one (1) heaping tablespoon of a psyllium-based bulking agent twice a day. Use sugar-free sweetener when possible to avoid excessive calories.  o Eat a normal breakfast.  o Set aside 15 minutes after breakfast to sit on the toilet, but do not strain to have a bowel movement.  o If you do not have a bowel movement by the third day, use an enema and repeat the above steps.   Controlling diarrhea o Switch to liquids and simpler foods for a few days to avoid stressing your intestines further. o Avoid dairy products (especially milk & ice cream) for a short time.  The  intestines often can lose the ability to digest lactose when stressed. o Avoid foods that cause gassiness or bloating.  Typical foods include beans and other legumes, cabbage, broccoli, and dairy foods.  Every person has some sensitivity to other foods, so listen to our body and avoid those foods that trigger problems for you. o Adding fiber (Citrucel, Metamucil, psyllium, Miralax) gradually can help thicken stools by absorbing excess fluid and retrain the intestines to act more normally.  Slowly increase the dose over a few weeks.  Too much fiber too soon can backfire and cause cramping &  bloating. o Probiotics (such as active yogurt, Align, etc) may help repopulate the intestines and colon with normal bacteria and calm down a sensitive digestive tract.  Most studies show it to be of mild help, though, and such products can be costly. o Medicines: - Bismuth subsalicylate (ex. Kayopectate, Pepto Bismol) every 30 minutes for up to 6 doses can help control diarrhea.  Avoid if pregnant. - Loperamide (Immodium) can slow down diarrhea.  Start with two tablets (4mg  total) first and then try one tablet every 6 hours.  Avoid if you are having fevers or severe pain.  If you are not better or start feeling worse, stop all medicines and call your doctor for advice o Call your doctor if you are getting worse or not better.  Sometimes further testing (cultures, endoscopy, X-ray studies, bloodwork, etc) may be needed to help diagnose and treat the cause of the diarrhea.  TROUBLESHOOTING IRREGULAR BOWELS 1) Avoid extremes of bowel movements (no bad constipation/diarrhea) 2) Miralax 17gm mixed in 8oz. water or juice-daily. May use BID as needed.  3) Gas-x,Phazyme, etc. as needed for gas & bloating.  4) Soft,bland diet. No spicy,greasy,fried foods.  5) Prilosec over-the-counter as needed  6) May hold gluten/wheat products from diet to see if symptoms improve.  7)  May try probiotics (Align, Activa, etc) to help calm  the bowels down 7) If symptoms become worse call back immediately.  Managing Pain  Pain after surgery or related to activity is often due to strain/injury to muscle, tendon, nerves and/or incisions.  This pain is usually short-term and will improve in a few months.   Many people find it helpful to do the following things TOGETHER to help speed the process of healing and to get back to regular activity more quickly:  1. Avoid heavy physical activity at first a. No lifting greater than 20 pounds at first, then increase to lifting as tolerated over the next few weeks b. Do not push through the pain.  Listen to your body and avoid positions and maneuvers than reproduce the pain.  Wait a few days before trying something more intense c. Walking is okay as tolerated, but go slowly and stop when getting sore.  If you can walk 30 minutes without stopping or pain, you can try more intense activity (running, jogging, aerobics, cycling, swimming, treadmill, sex, sports, weightlifting, etc ) d. Remember: If it hurts to do it, then dont do it!  2. Take Acetaminophen Anti-inflammatory medication i. Acetaminophen 500mg  tabs (Tylenol) 1-2 pills with every meal and just before bedtime (avoid if you have liver problems) ii. Take with food/snack around the clock for 1-2 weeks iii. This helps the muscle and nerve tissues become less irritable and calm down faster  3. Use a Heating pad or Ice/Cold Pack a. 4-6 times a day b. May use warm bath/hottub  or showers  4. Try Gentle Massage and/or Stretching  a. at the area of pain many times a day b. stop if you feel pain - do not overdo it  Try these steps together to help you body heal faster and avoid making things get worse.  Doing just one of these things may not be enough.    If you are not getting better after two weeks or are noticing you are getting worse, contact our office for further advice; we may need to re-evaluate you & see what other things we  can do to help.   DRAIN CARE:   You have a  closed bulb drain to help you heal.  A bulb drain is a small, plastic reservoir which creates a gentle suction. It is used to remove excess fluid from a surgical wound. The color and amount of fluid will vary. Immediately after surgery, the fluid is bright red. It may gradually change to a yellow color. When the amount decreases to about 1 or 2 tablespoons (15 to 30 cc) per 24 hours, your caregiver will usually remove it.  DAILY CARE  Keep the bulb compressed at all times, except while emptying it. The compression creates suction.   Keep sites where the tubes enter the skin dry and covered with a light bandage (dressing).   Tape the tubes to your skin, 1 to 2 inches below the insertion sites, to keep from pulling on your stitches. Tubes are stitched in place and will not slip out.   Pin the bulb to your shirt (not to your pants) with a safety pin.   For the first few days after surgery, there usually is more fluid in the bulb. Empty the bulb whenever it becomes half full because the bulb does not create enough suction if it is too full. Include this amount in your 24 hour totals.   When the amount of drainage decreases, empty the bulb at the same time every day. Write down the amounts and the 24 hour totals. Your caregiver will want to know them. This helps your caregiver know when the tubes can be removed.   (We anticipate removing the drain in 1-3 weeks, depending on when the output is <70mL a day for 2+ days)  If there is drainage around the tube sites, change dressings and keep the area dry. If you see a clot in the tube, leave it alone. However, if the tube does not appear to be draining, let your caregiver know.  TO EMPTY THE BULB  Open the stopper to release suction.   Holding the stopper out of the way, pour drainage into the measuring cup that was sent home with you.   Measure and write down the amount. If there are 2 bulbs, note the  amount of drainage from bulb 1 or bulb 2 and keep the totals separate. Your caregiver will want to know which tube is draining more.   Compress the bulb by folding it in half.   Replace the stopper.   Check the tape that holds the tube to your skin, and pin the bulb to your shirt.  SEEK MEDICAL CARE IF:  The drainage develops a bad odor.   You have an oral temperature above 102 F (38.9 C).   The amount of drainage from your wound suddenly increases or decreases.   You accidentally pull out your drain.   You have any other questions or concerns.  MAKE SURE YOU:   Understand these instructions.   Will watch your condition.   Will get help right away if you are not doing well or get worse.     Call our office if you have any questions about your drain. [phone]

## 2014-12-09 NOTE — Progress Notes (Signed)
[name] for Infectious Disease    Date of Admission:  11/30/2014   Total days of antibiotics 9        Day 9 fidoxamicin        Day 8 piptazo        Day 3 fluconazole           ID: Shelly Hood is a 59 y.o. female with  Hx of refractory c.difficile presents with new onset RUQ pain, mild transaminitis found to have perihepatic abscess 5 x 7cm concern for etiology, perf peptic ulcer vs. Now contained duodenal stump. Ruled out  anastomosis leak from previous roux en Y, antrectomy, ruled out colitis from c.difficile. Presentation also complicated by pleural effusion s/p 732mL thoracentesis. 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 No fevers, starting to eat solids without difficulty  Medications:  . acetaminophen  1,000 mg Oral TID  . ALPRAZolam  0.25 mg Oral Daily  . antiseptic oral rinse  7 mL Mouth Rinse BID  . atorvastatin  20 mg Oral q1800  . famotidine  40 mg Oral BID  . feeding supplement (PRO-STAT SUGAR FREE 64)  30 mL Oral TID BM  . fidaxomicin  200 mg Oral BID  . fluconazole  200 mg Oral Daily  . FLUoxetine  40 mg Oral BID  . lip balm   Topical BID  . multivitamins with iron  1 tablet Oral Daily  . nystatin  5 mL Oral QID  . piperacillin-tazobactam (ZOSYN)  IV  3.375 g Intravenous 3 times per day  . psyllium  1 packet Oral Daily  . saccharomyces boulardii  250 mg Oral BID  . [START ON 12/11/2014] vancomycin   125 mg Oral 4 times per day  . vitamin C  500 mg Oral BID    Objective: Vital signs in last 24 hours: Temp:  [97.7 F (36.5 C)-98.8 F (37.1 C)] 98.8 F (37.1 C) (09/30 1500) Pulse Rate:  [69-72] 71 (09/30 1500) Resp:  [17-18] 18 (09/30 1500) BP: (100-103)/(48-57) 103/52 mmHg (09/30 1500) SpO2:  [92 %-95 %] 92 % (09/30 1500) Did not examine  Lab Results  Recent Labs  12/07/14 0519 12/08/14 0257 12/09/14 0523  WBC 14.1* 13.4*  --   HGB 10.4* 9.9*  --   HCT 32.6* 31.0*  --   NA  --  137 136  K 3.3* 4.8 4.1  CL  --  100* 96*  CO2  --  30 29  BUN  --  <5* 7  CREATININE  --  0.58 0.65   Liver Panel  Recent Labs  12/09/14 1526  PROT 7.2  ALBUMIN 2.2*  AST 106*  ALT 151*  ALKPHOS 291*  BILITOT 0.5  BILIDIR 0.1  IBILI 0.4    Studies/Results: Dg Abd Acute W/chest  12/09/2014   CLINICAL DATA:  Low-grade fever.  Abdominal abscess.  Abdominal pain  EXAM: DG ABDOMEN ACUTE W/ 1V CHEST  COMPARISON:  CT abdomen 12/01/2014  FINDINGS: Mild to moderate right pleural effusion and right lower lobe airspace disease unchanged from 12/05/2014. No pneumothorax. Left lung clear. Right arm PICC tip in the SVC.  Two-view abdomen reveals pigtail catheter right upper quadrant in satisfactory position. Air-fluid levels in the transverse colon suggesting mild ileus. No small bowel obstruction. Air in the rectum. Surgical bowel clips overlying the stomach and in the left abdomen. Negative for free air. No renal calculi.  IMPRESSION: No change right pleural effusion and right lower lobe airspace disease.  Mild colonic ileus. Right upper quadrant drainage catheter in satisfactory position.   Electronically Signed   By: Franchot Gallo M.D.   On: 12/09/2014 13:37     Assessment/Plan: Right upper quadrant perihepatic abscess =Fever curve down and wbc slowly trending down. She is currently on day 8 of Iv piptazo. Will plan to continue antibiotics for total of 14 d to finish on 12/15/14. Recommend  repeat imagine on 10/6 or 10/7 to assess when to pull drain.   Refractory c.difficile =  Currently day 9 of 10 dificid in part for prophylaxis/treatment. For discharge, will do oral vanco 125mg  BID x 10 days for prophylaxis while she is on antibiotics.  Chronic diarrhea = it would be useful to see what her bowel habits look like with the reinitiation of diet. I support seeing if we can differentiate if she is having a pattern consistent with dumping syndrome. Agree with Dr. Johney [place]' recs with advancing diet.. Continue with postgastrectomy diet training reinforcement  Gastritis = recommend h2 blockers instead of ppi to minimize risk of recurrent c.difficile   Will have her follow up in teh ID clinic in 2 wk  [place], [facility] for Infectious Diseases Cell: [phone] Pager: [phone]  12/09/2014, 5:06 PM

## 2014-12-09 NOTE — Progress Notes (Signed)
i spoke with patient's daughter, Philis Nettle KZLDJT, regarding patient's treatment plan. She is very concerned of how her mother can be able to rehabilitate at home since the patient's husband has some health problems and no other support nearby. She feels her mother is still quite limited, questioned if she would need to stay in the hospital, or skilled facility for rehab given that she is still having odonyphagia, poor po intake, oxygen dependence, and require IV abtx.  Will talk with Dr. Rae Lips for discharge planning.

## 2014-12-09 NOTE — Evaluation (Signed)
Occupational Therapy Evaluation Patient Details Name: Shelly Hood MRN: [id number] DOB: [birth year] Today's Date: 12/09/2014    History of Present Illness 59 y.o. 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 The patient has had C. difficile colitis since April 20 16th. 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 Diarrhea recurred and she was placed on vancomycin on 8/22. She required a second course of vancomycin that was started on 9/14. She presented to the hospital with right upper quadrant pain. She states that she had had ongoing diarrhea prior to presentation to the hospital. Imaging revealed right upper quadrant fluid collection. Right upper quadrant drain placed by IR. The patient continued to have fevers subsequent to this. On admission she had a small right-sided pleural effusion. It was noted that she was slowly becoming more hypoxic and had decreased breath sounds in right middle lung field and therefore a repeat x-ray was done which revealed a now moderate sized right sided pleural effusion. IR guided thoracentesis performed on 9/26.   Clinical Impression   Pt reports of 7/10 pain with reports of having medication prior to OT arrival. Pt performing tasks at mod I level secondary to increased time needed secondary to pain. Pt has no other OT needs at this time.     Follow Up Recommendations  No OT follow up    Equipment Recommendations  None recommended by OT    Recommendations for Other Services       Precautions / Restrictions Precautions Precautions: None Restrictions Weight Bearing Restrictions: No      Mobility Bed Mobility Overal bed mobility: Independent                Transfers Overall transfer level: Modified independent                General transfer comment: increased time secondary to pain    Balance Overall balance assessment: Independent                                          ADL Overall ADL's : At baseline                                       General ADL Comments: Pt obtained clothing materials and donned and doffed pants and socks with mod I. Pt ambulated to toilet without use of device and performed all toileting tasks independently this session. Pt managing  IV pole this session secondary to her receiving fluids as well. Pt ambulated 250' this session without  use of AD and mod I. OT did recommend  pt have spouse provide supervision when she initially attempts to get into the shower for safety.               Pertinent Vitals/Pain Pain Assessment: 0-10 Pain Score: 7  Pain Descriptors / Indicators: Aching;Tender Pain Intervention(s): Monitored during session;Repositioned     Hand Dominance Right   Extremity/Trunk Assessment Upper Extremity Assessment Upper Extremity Assessment: Overall WFL for tasks assessed   Lower Extremity Assessment Lower Extremity Assessment: Overall WFL for tasks assessed   Cervical / Trunk Assessment Cervical / Trunk  Assessment: Normal   Communication Communication Communication: No difficulties   Cognition Arousal/Alertness: Awake/alert Behavior During Therapy: WFL for tasks assessed/performed Overall Cognitive Status: Within Functional Limits for tasks assessed                                Home Living Family/patient expects to be discharged to:: Private residence Living Arrangements: Spouse/significant other Available Help at Discharge: Family;Friend(s);Available 24 hours/day Type of Home: House Home Access: Stairs to enter [employer] of Steps: 1 Entrance Stairs-Rails: None Home Layout: One level     Bathroom Shower/Tub: Walk-in Hydrologist: Standard Bathroom  Accessibility: Yes How Accessible: Accessible via walker Home Equipment: Bedside commode;Shower seat          Prior Functioning/Environment Level of Independence: Independent             OT Diagnosis: Acute pain         OT Goals(Current goals can be found in the care plan section) Acute Rehab OT Goals Patient Stated Goal: to go home OT Goal Formulation: With patient Time For Goal Achievement: 12/23/14 Potential to Achieve Goals: Good  OT Frequency:     Barriers to D/C:    none  known at this time          End of Session    Activity Tolerance: Patient tolerated treatment well Patient left: in bed;with call bell/phone within reach   Time: 5188-4166 OT Time Calculation (min): 13 min Charges:  OT General Charges $OT Visit: 1 Procedure OT Evaluation $Initial OT Evaluation Tier I: 1 Procedure  Phineas Semen, MS, OTR/L 12/09/2014, 4:56 PM

## 2014-12-10 LAB — CULTURE, BODY FLUID W GRAM STAIN -BOTTLE: Culture: NO GROWTH

## 2014-12-10 LAB — BASIC METABOLIC PANEL
Anion gap: 9 (ref 5–15)
BUN: 12 mg/dL (ref 6–20)
CHLORIDE: 99 mmol/L — AB (ref 101–111)
CO2: 29 mmol/L (ref 22–32)
Calcium: 8.8 mg/dL — ABNORMAL LOW (ref 8.9–10.3)
Creatinine, Ser: 0.71 mg/dL (ref 0.44–1.00)
GFR calc Af Amer: >60 mL/min (ref >60)
GFR calc non Af Amer: >60 mL/min (ref >60)
GLUCOSE: 105 mg/dL — AB (ref 65–99)
POTASSIUM: 4.6 mmol/L (ref 3.5–5.1)
Sodium: 137 mmol/L (ref 135–145)

## 2014-12-10 LAB — CBC
HEMATOCRIT: 33.3 % — AB (ref 36.0–46.0)
Hemoglobin: 10.7 g/dL — ABNORMAL LOW (ref 12.0–15.0)
MCH: 27 pg (ref 26.0–34.0)
MCHC: 32.1 g/dL (ref 30.0–36.0)
MCV: 84.1 fL (ref 78.0–100.0)
Platelets: 498 10*3/uL — ABNORMAL HIGH (ref 150–400)
RBC: 3.96 MIL/uL (ref 3.87–5.11)
RDW: 16 % — AB (ref 11.5–15.5)
WBC: 10.2 10*3/uL (ref 4.0–10.5)

## 2014-12-10 MED ORDER — VANCOMYCIN HCL 125 MG PO CAPS: 125.0000 mg | ORAL_CAPSULE | Freq: Four times a day (QID) | ORAL | Status: DC

## 2014-12-10 MED ORDER — FAMOTIDINE 40 MG PO TABS: 40.0000 mg | ORAL_TABLET | Freq: Two times a day (BID) | ORAL | Status: DC

## 2014-12-10 MED ORDER — OXYCODONE HCL 5 MG PO TABS: 5.0000 mg | ORAL_TABLET | Freq: Four times a day (QID) | ORAL | Status: DC | PRN

## 2014-12-10 MED ORDER — VANCOMYCIN 50 MG/ML ORAL SOLUTION: 125.0000 mg | Freq: Four times a day (QID) | ORAL | Status: DC

## 2014-12-10 MED ORDER — HEPARIN SOD (PORK) LOCK FLUSH 100 UNIT/ML IV SOLN
250.0000 [IU] | INTRAVENOUS | Status: AC | PRN
  Administered 2014-12-10: 250 [IU]

## 2014-12-10 MED ORDER — PIPERACILLIN-TAZOBACTAM 3.375 G IVPB: 3.3750 g | Freq: Three times a day (TID) | INTRAVENOUS | Status: DC

## 2014-12-10 MED ORDER — LIDOCAINE VISCOUS 2 % MT SOLN: 20.0000 mL | OROMUCOSAL | Status: DC | PRN

## 2014-12-10 NOTE — Progress Notes (Signed)
CM met with pt and spouse in room to see if I could fax the prescriptions to expedite their transition to home.  Unfortunately, it is pain medication and will have to be physically brought into pharmacy.  Cm has confirmed with [facility] concern ing this evening's SOC.  Family feel if they are able to leave the hospital prior to 17:00 they will have ample time to go to the pharmacy on the way home prior to the [facility] this evening.  No other CM needs were communicated.

## 2014-12-10 NOTE — Progress Notes (Signed)
SATURATION QUALIFICATIONS: (This note is used to comply with regulatory documentation for home oxygen)  Patient Saturations on Room Air at Rest = 96%   Patient Saturations on Room Air while Ambulating = 94%  Patient Saturations on No oxygen was used while ambulating  Please briefly explain why patient needs home oxygen: Patients saturations stayed at 94 and above while ambulating 2 laps around the entire unit. No home oxygen should be needed.

## 2014-12-10 NOTE — Discharge Summary (Signed)
Physician Discharge Summary  Shelly Hood JSE:[id number] DOB: [birth year] DOA: 11/30/2014  PCP: Viviana Simpler, MD  Admit date: 11/30/2014 Discharge date: 12/10/2014  Time spent: 40 minutes  Recommendations for Outpatient Follow-up:  1. Follow-up with primary care physician within one week. 2. Follow-up with Dr. Johney Maine and Dr. Baxter Flattery in 1 week. 3. IR to follow-up with the perihepatic percutaneous drain, patient will have study on October 4, all services including general surgery, ID and internal medicine preferred this study to be postponed to the end of the week if possible. 4. Zosyn finish status 12/15/14. 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   Clostridium difficile colitis   Leukocytosis   Elevated LFTs   RUQ pain   Peptic ulcer disease   Elevated WBC count   Fever   Gastritis   Protein-calorie malnutrition, severe   Hypoxia   S/P distal gastrectomy/vagotomy with Roux-en-Y reconstruction - SHE HAS NOT HAD A GASTRIC BYPASS   Severe protein-energy malnutrition   Discharge Condition: Stable  Diet recommendation: Heart healthy, bariatric diet  Filed Weights   11/30/14 0105  Weight: 63.192 kg (139 lb 5 oz)    History of present illness:  Patient is 59 year old female with known history of pyloric ulcer, esophageal reflux disease, pyloric stenosis, gastric outlet obstruction and status post laparoscopic Roux-en-Y bypass and hiatal hernia repair, and 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 Carlean Purl, now presented to [facility] emergency department with progressively worsening watery diarrhea especially over the past 24 hours, associated with constant and sharp right upper and mid area abdominal pain, 10/10 in severity and occasionally but not  consistently radiating to entire abdominal area. Patient denies any specific alleviating factors, pain seems to be worse with eating. Patient reports this has been associated with nausea, poor oral intake, malaise. Patient denies fevers and chills, no chest pain or shortness of breath. Patient explains she has had up to 17 episodes of watery diarrhea per day and has significantly affected the quality of her life. Patient also explains that she has been previously on oral vancomycin and Flagyl and neither medication helped with C. Difficile.  In emergency department, patient noted mild distress due to abdominal pain, vital signs were stable, blood work notable for WBC 14.6. TRH asked to admit for further evaluation and GI team consulted for assistance.  Hospital Course:   Sepsis - Evident with fever of 101.8 and WBC of 15.4 and evidence of infection. - Likely secondary to perihepatic abscess and C. difficile colitis. - Currently afebrile, leukocytosis improving (13.4). This is resolved after aggressive hydration with IV fluids and IV antibiotics. - On discharge Zosyn to complete total 14 days of IV antibiotics.  Clostridium difficile colitis - Started on the Fidaxomycin by ID, received 10 days. - Diarrhea continues to improved, please note that patient had baseline has chronic diarrhea. - ID recommended 10 more days of vancomycin, prescription given.  Perihepatic abscesses - Discovered on CT scan - -IR consulted for drainage- RUQ drain placed on 9/23 - Cont Zosyn - -Follow up cultures- ID assisting with management - Intermittenlty spiked fevers but temperature curve is improving currently afebrile - History of gastric ulcer resulting in a stricture in outlet obstruction status post s/p robotic truncal vagotomy, distal gastrectomy, roux-en-y and hiatal hernia repair by Dr. Johney Maine on 06/24/14,  -  Question of whether she may have had a gastric perforation or leaking anastomosis which resulted in  abscesses- Upper GI with Gastrograffin does not reveal a leak - Drainage catheter injected 9/26 to look for leak-no fistulous connection to bowel sound - Tolerating diet, defervesced,  PICC line placed, IV Zosyn at home to complete total of 14 days of IV antibiotics.  Acute hypoxic respiratory failure-right-sided pleural effusion - 9/26 Thoracentesis of moderate right-sided pleural effusion-possibly reactive from above-mentioned abscesses - Fluid is exudative-pulmonary consulted for their opinion on whether this is reactive or infectious-they feel it is reactive as well-we'll need to continue to monitor for re-collection - 2-D echo ordered to determine if she has underlying heart failure- this reveals that she has a normal EF and grade 1 diastolic dysfunction. - Weaned off of oxygen, oxygen saturation in mid to high 90s on room air.  Elevated LFTs -Possibly in relation to perihepatic abscess-improved   Anorexia - Diet advanced yesterday, patient has been able to eat soft foods (yogurt, beans) without complications - Burning with acidic/salty foods, recommended to keep foods soft/bland   Depression/anxiety - Continue Prozac and Xanax  Thrush  - Patient complaining of symptoms today although previously improving with nystatin - Given Diflucan for 5 days while she was in the hospital. - Tongue look reddish (glossitis more than is oral thrush), ferritin, folate and B12 were normal. - Given lidocaine viscous for mouth pain on discharge.  Hypokalemia - Likely secondary to diarrhea (K: 2.8, Mg: 2.2) - Repleted aggressively with oral supplements, on discharge is 4.6.  Gastritis -Per general surgery patient has gastritis, she was on Protonix. -Discharged home on Pepcid 40 mg twice a day, discontinued PPI because of risk of C. difficile.  Severe protein energy malnutrition -Has lost BUN of 1.9, prealbumin was 3.7. -Patient has had difficulties with eating since she had the gastric surgery  plus the prolonged illness with C. Difficile.   Procedures:  Placement of percutaneous abscess drain by IR on 12/02/2014.  US guided right-sided thoracentesis with removal of 700 mL of serous fluids done by IR on 12/05/2014  Consultations:  General surgery.  ID  Discharge Exam: Filed Vitals:   12/10/14 0530  BP: 99/42  Pulse: 66  Temp: 98.2 F (36.8 C)  Resp: 17   General: Alert and awake, oriented x3, not in any acute distress. HEENT: anicteric sclera, pupils reactive to light and accommodation, EOMI CVS: S1-S2 clear, no murmur rubs or gallops Chest: clear to auscultation bilaterally, no wheezing, rales or rhonchi Abdomen: soft nontender, nondistended, normal bowel sounds, no organomegaly Extremities: no cyanosis, clubbing or edema noted bilaterally Neuro: Cranial nerves II-XII intact, no focal neurological deficits  Discharge Instructions   Discharge Instructions    Call MD for:  extreme fatigue    Complete by:  As directed      Call MD for:  hives    Complete by:  As directed      Call MD for:  persistant nausea and vomiting    Complete by:  As directed      Call MD for:  redness, tenderness, or signs of infection (pain, swelling, redness, odor or green/yellow discharge around incision site)    Complete by:  As directed      Call MD for:  severe uncontrolled pain    Complete by:  As directed      Call MD for:    Complete by:  As directed   Temperature > 101.15F     Diet -  low sodium heart healthy    Complete by:  As directed      Diet - low sodium heart healthy    Complete by:  As directed      Discharge instructions    Complete by:  As directed   Please see discharge instruction sheets.  Also refer to handout given an office.  Please call our office if you have any questions or concerns [phone]     Discharge wound care:    Complete by:  As directed   If you have closed incisions, shower and bathe over these incisions with soap and water every day.   Remove all surgical dressings on postoperative day #3.  You do not need to replace dressings over the closed incisions unless you feel more comfortable with a Band-Aid covering it.   If you have an open wound that requires packing, please see wound care instructions.  In general, remove all dressings, wash wound with soap and water and then replace with saline moistened gauze.  Do the dressing change at least every day.  Please call our office [phone] if you have further questions.     Driving Restrictions    Complete by:  As directed   No driving until off narcotics and can safely swerve away without pain during an emergency     Increase activity slowly    Complete by:  As directed   Walk an hour a day.  Use 20-30 minute walks.  When you can walk 30 minutes without difficulty, it is fine to restart low impact/moderate activities such as biking, jogging, swimming, sexual activity, etc.  Eventually you can increase to unrestricted activity when not feeling pain.  If you feel pain: STOP!Marland Kitchen   Let pain protect you from overdoing it.  Use ice/heat & over-the-counter pain medications to help minimize soreness.  If that is not enough, then use your narcotic pain prescription as needed to remain active.  It is better to take extra pain medications and be more active than to stay bedridden to avoid all pain medications.     Increase activity slowly    Complete by:  As directed      Lifting restrictions    Complete by:  As directed   Avoid heavy lifting initially.  Do not push through pain.  You have no specific weight limit - if it hurts to do, DON'T DO IT.   If you feel no pain, you are not injuring anything.  Pain will protect you from injury.  Coughing and sneezing are far more stressful to your incision than any lifting.  Avoid resuming heavy lifting / intense activity until off all narcotic pain medications.  When ready to exercise more, give yourself 2 weeks to gradually get back to full intense  exercise/activity.     May shower / Bathe    Complete by:  As directed      May walk up steps    Complete by:  As directed      Sexual Activity Restrictions    Complete by:  As directed   Sexual activity as tolerated.  Do not push through pain.  Pain will protect you from injury.     Walk with assistance    Complete by:  As directed   Walk over an hour a day.  May use a walker/cane/companion to help with balance and stamina.          Current Discharge Medication List    START taking  these medications   Details  famotidine (PEPCID) 40 MG tablet Take 1 tablet (40 mg total) by mouth 2 (two) times daily. Qty: 60 tablet, Refills: 0    lidocaine (XYLOCAINE) 2 % solution Use as directed 20 mLs in the mouth or throat as needed for mouth pain. Qty: 100 mL, Refills: 0    oxyCODONE (OXY IR/ROXICODONE) 5 MG immediate release tablet Take 1 tablet (5 mg total) by mouth every 6 (six) hours as needed for moderate pain, severe pain or breakthrough pain. Qty: 20 tablet, Refills: 0    piperacillin-tazobactam (ZOSYN) 3.375 GM/50ML IVPB Inject 50 mLs (3.375 g total) into the vein every 8 (eight) hours. Qty: 50 mL, Refills: 1      CONTINUE these medications which have CHANGED   Details  vancomycin (VANCOCIN HCL) 125 MG capsule Take 1 capsule (125 mg total) by mouth 4 (four) times daily. Qty: 40 capsule, Refills: 0   Associated Diagnoses: Recurrent Clostridium difficile diarrhea      CONTINUE these medications which have NOT CHANGED   Details  acetaminophen (TYLENOL) 500 MG tablet Take 1,000 mg by mouth every 6 (six) hours as needed for mild pain or fever (pain and fever).     ALPRAZolam (XANAX) 0.25 MG tablet Take 0.25 mg by mouth daily.     atorvastatin (LIPITOR) 20 MG tablet Take 1 tablet (20 mg total) by mouth daily at 6 PM. Qty: 90 tablet, Refills: 3    FLUoxetine (PROZAC) 40 MG capsule Take 1 capsule (40 mg total) by mouth daily. Qty: 90 capsule, Refills: 3    saccharomyces  boulardii (FLORASTOR) 250 MG capsule Take 500 mg by mouth daily.      STOP taking these medications     diphenoxylate-atropine (LOMOTIL) 2.5-0.025 MG per tablet        Allergies  Allergen Reactions  . Cefotetan Other (See Comments)    Thrombocytopenia, plat ct 18K  . Naproxen     ulcers  . Nsaids     DUODENAL ULCER  . Aspirin     Ulcers   . Ibuprofen     ulcers   . Toradol [Ketorolac Tromethamine]     ulcers  . Codeine Sulfate Nausea Only    REACTION: nausea only tolerates hydrocodone  . Ensure [Nutritional Supplements] Diarrhea    Dumping syndrome  . Flagyl [Metronidazole] Other (See Comments)    Weak, bad taste    Follow-up Information    Follow up with GROSS,STEVEN C., MD. Schedule an appointment as soon as possible for a visit in 2 weeks.   Specialty:  General Surgery   Why:  To follow up after your hospital stay   Contact information:   [place] [address] [phone]        The results of significant diagnostics from this hospitalization (including imaging, microbiology, ancillary and laboratory) are listed below for reference.    Significant Diagnostic Studies: Dg Chest 1 View  12/05/2014   CLINICAL DATA:  Status post right-sided thoracentesis.  EXAM: CHEST 1 VIEW  COMPARISON:  December 04, 2014.  FINDINGS: Stable cardiomediastinal silhouette. No pneumothorax is noted. Right pleural effusion noted on prior exam is significantly smaller status post thoracentesis, although mild residual effusion remains. Elevated right hemidiaphragm is noted as well. Stable interstitial densities are noted in the left lung concerning for pulmonary edema. Bony thorax is unremarkable.  IMPRESSION: No pneumothorax status post right-sided thoracentesis. Right pleural effusion is significantly smaller compared to prior exam. Stable interstitial  densities noted in left lung concerning for pulmonary edema.   Electronically Signed   By: Marijo Conception,  M.D.   On: 12/05/2014 11:56   Dg Chest 2 View  12/04/2014   CLINICAL DATA:  Hypoxia.  Right upper quadrant pain.  EXAM: CHEST  2 VIEW  COMPARISON:  Included lung bases from CT abdomen/ pelvis 12/01/2014  FINDINGS: Moderate right pleural effusion with adjacent basilar airspace disease, appears increased from prior CT. Minimal blunting of left costophrenic angle, likely small left pleural effusion. There is no pulmonary edema. Heart size appears normal, mediastinal contours partially obscured. No pneumothorax. No acute osseous abnormalities are seen. A drain is present in the right upper quadrant of the abdomen.  IMPRESSION: Increased size of right pleural effusion, now moderate in degree. Associated basilar airspace disease, likely compressive atelectasis. Small left pleural effusion.   Electronically Signed   By: Jeb Levering M.D.   On: 12/04/2014 18:51   Dg Abd 1 View  11/30/2014   CLINICAL DATA:  RIGHT lower quadrant pain, recent history of C difficile colitis.  EXAM: ABDOMEN - 1 VIEW  COMPARISON:  CT abdomen and pelvis November 25, 2014  FINDINGS: The bowel gas pattern is normal. No radio-opaque calculi or other significant radiographic abnormality are seen. No free air. Soft tissue planes and included osseous structures are nonsuspicious.  IMPRESSION: Negative.   Electronically Signed   By: Elon Alas M.D.   On: 11/30/2014 05:25   Nm Hepatobiliary Including Gb  12/01/2014   CLINICAL DATA:  Right upper quadrant abdominal pain with elevated liver function studies. History of gastric outlet obstruction and pyloric ulcer. Initial encounter.  EXAM: NUCLEAR MEDICINE HEPATOBILIARY IMAGING  TECHNIQUE: Sequential images of the abdomen were obtained out to 60 minutes following intravenous administration of radiopharmaceutical.  RADIOPHARMACEUTICALS:  5.6 mCi Tc-44m  Choletec IV  COMPARISON:  Abdominal CT 11/25/2014.  FINDINGS: There is homogeneous hepatic activity with spontaneous opacification of the  gallbladder, biliary system and small bowel. No evidence of bile leak or obstruction.  IMPRESSION: Normal examination.  The cystic and common bile ducts are patent.   Electronically Signed   By: Richardean Sale M.D.   On: 12/01/2014 11:28   US Abdomen Complete  11/25/2014   CLINICAL DATA:  Right lower quadrant pain 2 days.  EXAM: ULTRASOUND ABDOMEN COMPLETE  COMPARISON:  None.  FINDINGS: Gallbladder: No gallstones or wall thickening visualized. No sonographic Murphy sign noted.  Common bile duct: Diameter: 3.0 mm.  Liver: 1.9 cm echogenic focus within the central liver likely a small hemangioma or focal fatty change.  IVC: No abnormality visualized.  Pancreas: Visualized portion unremarkable.  Spleen: Size and appearance within normal limits.  Right Kidney: Length: 10.8 cm. Echogenicity within normal limits. No mass or hydronephrosis visualized.  Left Kidney: Length: Lower limits of normal in size measuring 8.8 cm. Echogenicity within normal limits. No mass or hydronephrosis visualized.  Abdominal aorta: No aneurysm visualized.  Other findings: None.  IMPRESSION: No acute hepatobiliary disease.  Focal echogenic center liver lesion measuring 1.9 cm likely a hemangioma versus focal fatty change.   Electronically Signed   By: Marin Olp M.D.   On: 11/25/2014 11:49   Ct Abdomen Pelvis W Contrast  12/01/2014   CLINICAL DATA:  Nausea, fever and right upper quadrant abdominal pain for 6 days. 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  EXAM: CT ABDOMEN AND PELVIS WITH CONTRAST  TECHNIQUE: Multidetector CT imaging of the abdomen and pelvis was performed using  the standard protocol following bolus administration of intravenous contrast.  CONTRAST:  178mL OMNIPAQUE IOHEXOL 300 MG/ML  SOLN  COMPARISON:  11/25/2014  FINDINGS: Lower chest: There is a new right-sided pleural effusion with overlying atelectasis. There is also a left basilar atelectasis. The heart is normal in size. No pericardial effusion. The  distal esophagus is grossly normal.  Hepatobiliary: No focal hepatic lesions or intrahepatic biliary dilatation. There are this subdiaphragmatic abscesses anterior to the liver. The upper abscess measures a maximum 5 cm in the lower abscess measures a maximum of 7 cm. No intrahepatic abscess. There is inflammatory phlegmon in the right upper quadrant near the patient's prior gastric bypass surgery. I do not see any definite leaking oral contrast but I assume is a perforation that is sealed off. There is fluid between the stomach and the liver.  Pancreas: No mass, inflammation or ductal dilatation.  Spleen: No focal lesions.  Normal size.  Adrenals/Urinary Tract: Stable scarring changes involving the upper pole region of left kidney. The adrenal glands and right kidney are normal.  Stomach/Bowel: There is fairly marked wall thickening involving the residual stomach suggesting gastritis or peptic ulcer disease. A perforated peptic ulcer is possible but the jejunal loop of small bowel at the anastomosis appears normal. No findings for small bowel obstruction. No free air. The colon is unremarkable except for moderate stool.  Vascular/Lymphatic: Stable aortic calcifications. The branch vessels are patent. The major venous structures are patent. Probable inflammatory/hyperplastic right upper quadrant lymph nodes. No retroperitoneal adenopathy.  Other: The uterus and ovaries are normal. The bladder is normal. No pelvic mass. There is a small amount of free pelvic fluid. No inguinal mass or adenopathy.  Musculoskeletal: No significant bony findings.  IMPRESSION: 1. Persistent inflammatory phlegmon involving the space between the liver and the stomach. Interval development of perihepatic abscesses as described above. This could be due to anastomotic breakdown along the hepatic margin of the stomach which has sealed off leak or perforated peptic ulcer. The stomach wall is markedly thickened. No obvious leaking oral contrast.  2. New right pleural effusion and overlying atelectasis.   Electronically Signed   By: Marijo Sanes M.D.   On: 12/01/2014 19:28   Ct Abdomen Pelvis W Contrast  11/25/2014   CLINICAL DATA:  Right upper quadrant pain for 3 4 days, Clostridium difficile infection for 5 weeks  EXAM: CT ABDOMEN AND PELVIS WITH CONTRAST  TECHNIQUE: Multidetector CT imaging of the abdomen and pelvis was performed using the standard protocol following bolus administration of intravenous contrast.  CONTRAST:  168mL OMNIPAQUE IOHEXOL 300 MG/ML  SOLN  COMPARISON:  None.  FINDINGS: Lower chest:  No significant opacities at the lung bases  Hepatobiliary: Negative  Pancreas: Negative  Spleen: Negative  Adrenals/Urinary Tract: Small nonobstructing calculi right kidney. Scarring upper pole left kidney.  Stomach/Bowel: Status post gastric bypass. Nonobstructive gas pattern. No significant smaller large bowel wall thickening. Gastrojejunostomy noted. Moderate inflammation in the vicinity of the hepatic flexure of the colon, primarily located in the abdominal fat immediately anterior and primarily cranial to the flexure. No evidence of pneumatosis or pneumoperitoneum.  Vascular/Lymphatic: Mild aortic calcification.  Reproductive: No significant abnormalities  Other: No ascites  Musculoskeletal: negative  IMPRESSION: Acute inflammatory change in the fat medially anterior to the hepatic flexure of the colon. Differential diagnostic possibilities include postoperative fat necrosis, epiploic appendagitis, focal colitis, or mass, which is less likely.   Electronically Signed   By: Skipper Cliche M.D.   On: 11/25/2014  14:29   Ir Sinus/fist Tube Chk-non Gi  12/05/2014   CLINICAL DATA:  59 year old female with a history of anterior abdominal fluid collection of the right upper quadrant.  Ultrasound-guided drainage was performed 12/02/2014.  EXAM: DRAINAGE CATHETER INJECTION  TECHNIQUE: Indwelling catheter was injected under fluoroscopy.  FLUOROSCOPY  TIME:  12 seconds  COMPARISON:  CT 12/01/2014, ultrasound 12/02/2014  FINDINGS: Patient is position supine position on the fluoroscopy table in the Interventional Radiology suite.  Small amount of contrast was infused through the indwelling catheter in the right upper quadrant.  No significant cavity size was present surrounding the pigtail catheter drain. No fistulous connection to bowel identified.  IMPRESSION: Injection of catheter in the right upper quadrant demonstrates no fistulous connection to bowel. No significant abscess cavity remains.  Signed,  Dulcy Fanny. Earleen Newport DO  [facility]  [facility]  PLAN: Patient should maintain a log of al put daily after discharge.  Would refer to [facility] drain clinic in 1-2 weeks after discharge for a drain check.   Electronically Signed   By: Corrie Mckusick D.O.   On: 12/05/2014 17:42   Dg Abd 2 Views  12/01/2014   CLINICAL DATA:  Right upper quadrant pain for 1 week.  EXAM: ABDOMEN - 2 VIEW  COMPARISON:  November 30, 2014  FINDINGS: The bowel gas pattern is normal. Moderate bowel content is identified in the colon. There is no evidence of free air. Surgical sutures identified in the left upper and mid abdomen. No radio-opaque calculi or other significant radiographic abnormality is seen. There is mild atelectasis of left lung base.  IMPRESSION: No bowel obstruction or free air. Moderate bowel content identified throughout colon.   Electronically Signed   By: Abelardo Diesel M.D.   On: 12/01/2014 12:27   Dg Abd Acute W/chest  12/09/2014   CLINICAL DATA:  Low-grade fever.  Abdominal abscess.  Abdominal pain  EXAM: DG ABDOMEN ACUTE W/ 1V CHEST  COMPARISON:  CT abdomen 12/01/2014  FINDINGS: Mild to moderate right pleural effusion and right lower lobe airspace disease unchanged from 12/05/2014. No pneumothorax. Left lung clear. Right arm PICC tip in the SVC.  Two-view abdomen reveals pigtail catheter right upper  quadrant in satisfactory position. Air-fluid levels in the transverse colon suggesting mild ileus. No small bowel obstruction. Air in the rectum. Surgical bowel clips overlying the stomach and in the left abdomen. Negative for free air. No renal calculi.  IMPRESSION: No change right pleural effusion and right lower lobe airspace disease.  Mild colonic ileus. Right upper quadrant drainage catheter in satisfactory position.   Electronically Signed   By: Franchot Gallo M.D.   On: 12/09/2014 13:37   Dg Duanne Limerick W/water Sol Cm  12/03/2014   CLINICAL DATA:  Abscesses around the stomach and liver. History of peptic ulcer disease and distal antrectomy with gastrojejunostomy. Concern for anastomotic leak.  EXAM: WATER SOLUBLE UPPER GI SERIES  TECHNIQUE: Single-column upper GI series was performed using water soluble contrast.  CONTRAST:  17mL OMNIPAQUE IOHEXOL 300 MG/ML  SOLN  COMPARISON:  CT 12/01/2014.  FLUOROSCOPY TIME:  Radiation Exposure Index (as provided by the fluoroscopic device):  If the device does not provide the exposure index:  Fluoroscopy Time (in minutes and seconds):  1 minutes 24 seconds  Number of Acquired Images:  18  FINDINGS: No esophageal stricture. No evidence of leak from the stomach. There is mild fold thickening in the distal stomach suggesting gastritis. There is prompt emptying  of the stomach into the jejunum. No anastomotic leak. No evidence of small bowel obstruction.  IMPRESSION: No evidence of anastomotic leak or obstruction. Mild fold thickening in the distal stomach likely reflects gastritis.   Electronically Signed   By: Rolm Baptise M.D.   On: 12/03/2014 12:53   [place] Image Guided Drainage By Percutaneous Catheter  12/02/2014   CLINICAL DATA:  59 year old female with a history of prior gastric bypass procedure with subsequent postoperative fluid collection in the upper abdomen. She has been referred for drainage.  EXAM: ULTRASOUND GUIDED DRAINAGE OF RIGHT UPPER QUADRANT FLUID COLLECTION   MEDICATIONS: 2.0 mg IV Versed; 200 mcg IV Fentanyl  Total Moderate Sedation Time: 20  PROCEDURE: The procedure, risks, benefits, and alternatives were explained to the patient. Questions regarding the procedure were encouraged and answered. The patient understands and consents to the procedure.  Patient is position supine position on a stretcher. Ultrasound survey of the upper abdomen was performed with images stored and sent to PACs.  The right upper quadrant was prepped with chlorhexidine in a sterile fashion, and a sterile drape was applied covering the operative field. A sterile gown and sterile gloves were used for the procedure. Local anesthesia was provided with 1% Lidocaine.  Using ultrasound guidance the skin and subcutaneous tissues were generously infiltrated with 1% lidocaine to the level of the abdominal wall.  Using ultrasound imaging a 10 French drain was advanced into the fluid collection using a trocar technique.  Once the drain was in place approximately 90 cc of bilious fluid was aspirated. A sample sent to the lab.  Retention suture was placed in the drain was placed to gravity bag.  Patient tolerated the procedure well and remained hemodynamically stable throughout.  No complications encountered and no significant blood loss encounter.  COMPLICATIONS: None.  FINDINGS: Ultrasound images demonstrate fluid collection overlying the liver.  90 cc of bilious fluid was drained from the fluid collection.  IMPRESSION: Status post ultrasound-guided drain placement into right upper quadrant fluid collection with 90 cc of bilious fluid aspirated.  Signed,  Dulcy Fanny. Earleen Newport, DO  [facility]  [facility]   Electronically Signed   By: Corrie Mckusick D.O.   On: 12/02/2014 18:08   US Thoracentesis Asp Pleural Space W/img Guide  12/05/2014   INDICATION: Symptomatic right sided pleural effusion  EXAM: US THORACENTESIS ASP PLEURAL SPACE W/IMG GUIDE  COMPARISON:  CXR  12/04/2014.  MEDICATIONS: None  COMPLICATIONS: None immediate  TECHNIQUE: Informed written consent was obtained from the patient after a discussion of the risks, benefits and alternatives to treatment. A timeout was performed prior to the initiation of the procedure.  Initial ultrasound scanning demonstrates a right pleural effusion. The lower chest was prepped and draped in the usual sterile fashion. 1% lidocaine was used for local anesthesia.  Under direct ultrasound guidance, a 19 gauge, 7-cm, Yueh catheter was introduced. An ultrasound image was saved for documentation purposes. The thoracentesis was performed. The catheter was removed and a dressing was applied. The patient tolerated the procedure well without immediate post procedural complication. The patient was escorted to have an upright chest radiograph.  FINDINGS: A total of approximately 700 ml of serous fluid was removed. Requested samples were sent to the laboratory.  IMPRESSION: Successful ultrasound-guided right sided thoracentesis yielding 700 ml of pleural fluid.  Read By:  Tsosie Billing PA-C   Electronically Signed   By: Corrie Mckusick D.O.   On: 12/05/2014 13:08  Microbiology: Recent Results (from the past 240 hour(s))  Culture, routine-abscess     Status: None   Collection Time: 12/02/14  6:32 PM  Result Value Ref Range Status   Specimen Description ABSCESS ABDOMEN  Final   Special Requests NONE  Final   Gram Stain   Final    NO WBC SEEN NO SQUAMOUS EPITHELIAL CELLS SEEN NO ORGANISMS SEEN Performed at [facility]    Culture   Final    MULTIPLE ORGANISMS PRESENT, NONE PREDOMINANT Note: NO STAPHYLOCOCCUS AUREUS ISOLATED NO GROUP A STREP (S.PYOGENES) ISOLATED Performed at [facility]    Report Status 12/06/2014 FINAL  Final  Culture, body fluid-bottle     Status: None (Preliminary result)   Collection Time: 12/05/14 11:15 AM  Result Value Ref Range Status   Specimen Description FLUID RIGHT PLEURAL  Final    Special Requests BAA 10CCS  Final   Culture NO GROWTH 4 DAYS  Final   Report Status PENDING  Incomplete  Gram stain     Status: None   Collection Time: 12/05/14 11:15 AM  Result Value Ref Range Status   Specimen Description FLUID RIGHT PLEURAL  Final   Special Requests NONE  Final   Gram Stain   Final    MODERATE WBC PRESENT,BOTH PMN AND MONONUCLEAR NO ORGANISMS SEEN    Report Status 12/05/2014 FINAL  Final     Labs: Basic Metabolic Panel:  Recent Labs Lab 12/05/14 1710 12/06/14 0024 12/07/14 0519 12/08/14 0257 12/09/14 0523 12/10/14 0500  NA 135 136  --  137 136 137  K 2.8* 2.8* 3.3* 4.8 4.1 4.6  CL 98* 98*  --  100* 96* 99*  CO2 27 30  --  30 29 29   GLUCOSE 105* 113*  --  118* 110* 105*  BUN <5* <5*  --  <5* 7 12  CREATININE 0.54 0.53  --  0.58 0.65 0.71  CALCIUM 7.8* 8.0*  --  8.7* 8.9 8.8*  MG  --  2.0 2.2  --   --   --    Liver Function Tests:  Recent Labs Lab 12/03/14 1219 12/05/14 1710 12/06/14 0024 12/09/14 1526  AST  --  186* 112* 106*  ALT  --  154* 134* 151*  ALKPHOS  --  226* 213* 291*  BILITOT 0.9 0.5 0.7 0.5  PROT  --  5.5* 5.6* 7.2  ALBUMIN  --  1.8* 1.9* 2.2*    Recent Labs Lab 12/03/14 1219 12/06/14 0024  LIPASE  --  24  AMYLASE 53  --    No results for input(s): AMMONIA in the last 168 hours. CBC:  Recent Labs Lab 12/05/14 0751 12/06/14 0024 12/07/14 0519 12/08/14 0257 12/10/14 0500  WBC 14.9* 14.9* 14.1* 13.4* 10.2  HGB 10.3* 10.1* 10.4* 9.9* 10.7*  HCT 30.8* 30.0* 32.6* 31.0* 33.3*  MCV 82.4 81.7 83.4 83.8 84.1  PLT 258 279 384 431* 498*   Cardiac Enzymes: No results for input(s): CKTOTAL, CKMB, CKMBINDEX, TROPONINI in the last 168 hours. BNP: BNP (last 3 results) No results for input(s): BNP in the last 8760 hours.  ProBNP (last 3 results) No results for input(s): PROBNP in the last 8760 hours.  CBG: No results for input(s): GLUCAP in the last 168 hours.     Signed:  Knowledge Escandon A  [facility] 12/10/2014, 10:16 AM

## 2014-12-10 NOTE — Progress Notes (Signed)
PT Cancellation Note  Patient Details Name: Shelly Hood MRN: [id number] DOB: [birth year]   Cancelled Treatment:    Reason Eval/Treat Not Completed: PT screened, no needs identified, will sign off.  Has been walking independently and is not in pain.   Ramond Dial 12/10/2014, 3:12 PM   Mee Hives, PT MS Acute Rehab Dept. Number: [facility] [id number] and [place] [phone]

## 2014-12-10 NOTE — Progress Notes (Signed)
CM confirmed with [facility] rep, Tiffany of [place] this evening for IV ABX.  Pt will receive last run in hospital at 12:00 and pt will receive 1st home dose this evening.  No other CM needs were communicated.

## 2014-12-10 NOTE — Progress Notes (Signed)
Subjective: She is pleasant.  No distress.  Alert and cooperative. She thinks she is going home today. Medical service looking at SNF. Tolerating regular diet. Minimal pain. No stools recorded. Drainage light green, only 30 mL out in 24 hours. Afebrile.  Heart rate 66.  WBC down to 10,200.  Hemoglobin 10.7.  Creatinine 0.71.  Glucose 105.  Objective: Vital signs in last 24 hours: Temp:  [97.9 F (36.6 C)-98.8 F (37.1 C)] 98.2 F (36.8 C) (10/01 0530) Pulse Rate:  [63-71] 66 (10/01 0530) Resp:  [17-18] 17 (10/01 0530) BP: (94-103)/(42-55) 99/42 mmHg (10/01 0530) SpO2:  [92 %-96 %] 96 % (10/01 0530) Last BM Date: 01/07/15  Intake/Output from previous day: 09/30 0701 - 10/01 0700 In: 685 [P.O.:680] Out: 30 [Drains:30] Intake/Output this shift:    General appearance: Alert.  Very pleasant.  No distress.  Very cooperative. Resp: clear to auscultation bilaterally GI: Soft.  Nondistended.  Minimal tenderness at drain site.  Drainage is green tinged but very thin and serous.  Nothing exudative.  No hernias. Extremities: No edema or tenderness.  Lab Results:   Recent Labs  12/08/14 0257 12/10/14 0500  WBC 13.4* 10.2  HGB 9.9* 10.7*  HCT 31.0* 33.3*  PLT 431* 498*   BMET  Recent Labs  12/09/14 0523 12/10/14 0500  NA 136 137  K 4.1 4.6  CL 96* 99*  CO2 29 29  GLUCOSE 110* 105*  BUN 7 12  CREATININE 0.65 0.71  CALCIUM 8.9 8.8*   PT/INR No results for input(s): LABPROT, INR in the last 72 hours. ABG No results for input(s): PHART, HCO3 in the last 72 hours.  Invalid input(s): PCO2, PO2  Studies/Results: Dg Abd Acute W/chest  12/09/2014   CLINICAL DATA:  Low-grade fever.  Abdominal abscess.  Abdominal pain  EXAM: DG ABDOMEN ACUTE W/ 1V CHEST  COMPARISON:  CT abdomen 12/01/2014  FINDINGS: Mild to moderate right pleural effusion and right lower lobe airspace disease unchanged from 12/05/2014. No pneumothorax. Left lung clear. Right arm PICC tip in the SVC.   Two-view abdomen reveals pigtail catheter right upper quadrant in satisfactory position. Air-fluid levels in the transverse colon suggesting mild ileus. No small bowel obstruction. Air in the rectum. Surgical bowel clips overlying the stomach and in the left abdomen. Negative for free air. No renal calculi.  IMPRESSION: No change right pleural effusion and right lower lobe airspace disease.  Mild colonic ileus. Right upper quadrant drainage catheter in satisfactory position.   Electronically Signed   By: Franchot Gallo Hood.D.   On: 12/09/2014 13:37    Anti-infectives: Anti-infectives    Start     Dose/Rate Route Frequency Ordered Stop   12/11/14 0600  vancomycin (VANCOCIN) 50 mg/mL oral solution 125 mg     125 mg Oral 4 times per day 12/09/14 1317     12/08/14 1400  fluconazole (DIFLUCAN) tablet 200 mg     200 mg Oral Daily 12/08/14 1247 12/11/14 0959   12/06/14 0845  fluconazole (DIFLUCAN) tablet 200 mg     200 mg Oral Daily 12/06/14 0841 12/08/14 1002   12/01/14 2230  piperacillin-tazobactam (ZOSYN) IVPB 3.375 g     3.375 g 12.5 mL/hr over 240 Minutes Intravenous 3 times per day 12/01/14 2138     12/01/14 1315  fidaxomicin (DIFICID) tablet 200 mg     200 mg Oral 2 times daily 12/01/14 1304 12/11/14 0959   11/30/14 1500  metroNIDAZOLE (FLAGYL) IVPB 500 mg  Status:  Discontinued  500 mg 100 mL/hr over 60 Minutes Intravenous Every 8 hours 11/30/14 0939 11/30/14 1400   11/30/14 1315  vancomycin (VANCOCIN) 50 mg/mL oral solution 500 mg  Status:  Discontinued     500 mg Oral 4 times per day 11/30/14 1312 11/30/14 1312   11/30/14 1230  fidaxomicin (DIFICID) tablet 200 mg  Status:  Discontinued     200 mg Oral 2 times daily 11/30/14 1227 12/01/14 1304   11/30/14 0630  metroNIDAZOLE (FLAGYL) IVPB 500 mg     500 mg 100 mL/hr over 60 Minutes Intravenous  Once 11/30/14 0626 11/30/14 0756     Assessment/Plan:  Right upper quadrant abdominal abscess.   Possible delayed duodenal stump  leak. Gastrografin upper GI negative for leak, however.  Leak would be unusual this far out from original surgery in April. Continue drainage.  This can be managed as an outpatient Heart healthy postgastrectomy diet Antibiotics per infectious disease See Dr. Lanell Persons note from yesterday for detailed discharge plan.  Likely going home or SNF with PICC and drain and oxygen.  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  His is being actively treated at this time Leukocytosis resolved Protein calorie malnutrition, moderately severe Depression and anxiety.  On Prozac and Xanax Thrush, improving with nystatin and Diflucan   LOS: 10 days    Shelly Hood 12/10/2014

## 2014-12-12 ENCOUNTER — Telehealth: Payer: Self-pay | Admitting: Internal Medicine

## 2014-12-12 NOTE — Telephone Encounter (Signed)
Shelly Hood called asking about her appointment with [facility], she states that Dr. Baxter Flattery does not want her to go tomorrow but it is still scheduled for tomorrow at 8:15 am

## 2014-12-12 NOTE — Telephone Encounter (Signed)
Ok good

## 2014-12-12 NOTE — Telephone Encounter (Signed)
Already rescheduled til thurs.

## 2014-12-13 ENCOUNTER — Other Ambulatory Visit: Payer: BLUE CROSS/BLUE SHIELD

## 2014-12-15 ENCOUNTER — Ambulatory Visit
Admission: RE | Admit: 2014-12-15 | Discharge: 2014-12-15 | Disposition: A | Discharge status: Home / Self Care | Payer: BLUE CROSS/BLUE SHIELD | Source: Ambulatory Visit | Attending: Interventional Radiology | Admitting: Interventional Radiology

## 2014-12-15 ENCOUNTER — Other Ambulatory Visit: Payer: Self-pay | Admitting: Interventional Radiology

## 2014-12-15 ENCOUNTER — Ambulatory Visit
Admission: RE | Admit: 2014-12-15 | Discharge: 2014-12-15 | Disposition: A | Discharge status: Home / Self Care | Payer: BLUE CROSS/BLUE SHIELD | Source: Ambulatory Visit | Attending: Surgery | Admitting: Surgery

## 2014-12-15 DIAGNOSIS — IMO0001 Reserved for inherently not codable concepts without codable children: Secondary | ICD-10-CM

## 2014-12-15 DIAGNOSIS — K651 Peritoneal abscess: Principal | ICD-10-CM

## 2014-12-15 DIAGNOSIS — T814XXD Infection following a procedure, subsequent encounter: Principal | ICD-10-CM

## 2014-12-15 DIAGNOSIS — T8149XA Infection following a procedure, other surgical site, initial encounter: Secondary | ICD-10-CM

## 2014-12-15 DIAGNOSIS — T8143XA Infection following a procedure, organ and space surgical site, initial encounter: Secondary | ICD-10-CM | POA: Insufficient documentation

## 2014-12-15 MED ORDER — IOPAMIDOL (ISOVUE-300) INJECTION 61%
100.0000 mL | Freq: Once | INTRAVENOUS | Status: AC | PRN
  Administered 2014-12-15: 100 mL via INTRAVENOUS

## 2014-12-15 NOTE — Progress Notes (Signed)
Patient ID: Shelly Hood, female   DOB: Feb 11, 1956, 59 y.o.   MRN: [id number]       Chief Complaint: Postop right upper quadrant abscess drain. Subsequent encounter. Outpatient follow-up.  Referring Physician(s): Claretha Cooper  History of Present Illness: Shelly Hood is a 59 y.o. female with a postop abdominal abscess, status post percutaneous drainage 2 weeks ago. Abscess drain is in the right upper quadrant, subcapsular  periHepatic in location. Patient returns for outpatient drain follow-up. No significant abdominal pain or fevers. She is tolerating a regular diet. Minimal bilious drainage output reported.  CT today demonstrates near-complete resolution of the right upper quadrant subcapsular perihepatic collection. Stable drain catheter position. No new collections. Negative for bowel obstruction or ileus  Drain injection following the CT demonstrates a small patent fistula from the collapse abscess cavity to the duodenal stump.  Past Medical History  Diagnosis Date  . Panic attacks   . High cholesterol   . Obesity   . Gastric outlet obstruction   . Pyloric ulcer   . Depression   . GERD (gastroesophageal reflux disease)   . Pyloric stenosis   . Clostridium difficile infection   . Clostridium difficile colitis 11/30/2014  . Peptic ulcer disease 12/02/2014  . Thrombocytopenia 06/26/2014    Past Surgical History  Procedure Laterality Date  . Rhinoplasty      X3  . Colonscopy  AGE 76  . Vagotomy N/A 06/24/2014    Procedure: ROBOTIC TRUNCAL VAGOTOMY ;  Surgeon: Tray Klayman Boston, MD;  Location: [facility];  Service: General;  Laterality: N/A;  This is [name]'s preference  . Antrectomy N/A 06/24/2014    Procedure: ROBOTIC DISTAL GASTRECTOMY;  Surgeon: Darya Bigler Boston, MD;  Location: [facility];  Service: General;  Laterality: N/A;  . Laparoscopic roux-en-y gastric bypass with hiatal hernia repair N/A 06/24/2014    Procedure: ROBOTIC ROUX-EN-Y CONSTRUCTION WITH HIATAL HERNIA REPAIR;   Surgeon: Alisah Grandberry Boston, MD;  Location: [facility];  Service: General;  Laterality: N/A;  . Colonoscopy N/A 10/19/2014    Procedure: COLONOSCOPY;  Surgeon: Gatha Mayer, MD;  Location: [place];  Service: Endoscopy;  Laterality: N/A;  with FMT  . Fecal transplant N/A 10/19/2014    Procedure: FECAL TRANSPLANT;  Surgeon: Gatha Mayer, MD;  Location: [place];  Service: Endoscopy;  Laterality: N/A;    Allergies: Cefotetan; Naproxen; Nsaids; Aspirin; Ibuprofen; Toradol; Codeine sulfate; Ensure; and Flagyl  Medications: Prior to Admission medications   Medication Sig Start Date End Date Taking? Authorizing Provider  acetaminophen (TYLENOL) 500 MG tablet Take 1,000 mg by mouth every 6 (six) hours as needed for mild pain or fever (pain and fever).     Historical Provider, MD  ALPRAZolam Duanne Moron) 0.25 MG tablet Take 0.25 mg by mouth daily.     Historical Provider, MD  atorvastatin (LIPITOR) 20 MG tablet Take 1 tablet (20 mg total) by mouth daily at 6 PM. Patient taking differently: Take 20 mg by mouth daily.  04/27/14   Venia Carbon, MD  famotidine (PEPCID) 40 MG tablet Take 1 tablet (40 mg total) by mouth 2 (two) times daily. 12/10/14   Verlee Monte, MD  FLUoxetine (PROZAC) 40 MG capsule Take 1 capsule (40 mg total) by mouth daily. 04/27/14   Venia Carbon, MD  lidocaine (XYLOCAINE) 2 % solution Use as directed 20 mLs in the mouth or throat as needed for mouth pain. 12/10/14   Verlee Monte, MD  oxyCODONE (OXY IR/ROXICODONE) 5 MG immediate release tablet Take 1  tablet (5 mg total) by mouth every 6 (six) hours as needed for moderate pain, severe pain or breakthrough pain. 12/10/14   Verlee Monte, MD  piperacillin-tazobactam (ZOSYN) 3.375 GM/50ML IVPB Inject 50 mLs (3.375 g total) into the vein every 8 (eight) hours. 12/10/14   Verlee Monte, MD  saccharomyces boulardii (FLORASTOR) 250 MG capsule Take 500 mg by mouth daily.    Historical Provider, MD  vancomycin (VANCOCIN) 50 mg/mL oral solution Take  2.5 mLs (125 mg total) by mouth every 6 (six) hours. 12/11/14   Verlee Monte, MD     Family History  Problem Relation Age of Onset  . Diabetes Mother   . Diabetes Maternal Grandmother   . Heart disease Father   . Colon cancer Neg Hx     Social History   Social History  . Marital Status: Married    Spouse Name: N/A  . Number of Children: 2  . Years of Education: N/A   Occupational History  . Real estate management     [employer] in [place] Topics  . Smoking status: Former Smoker -- 0.25 packs/day for 30 years    Types: Cigarettes    Quit date: 06/18/2014  . Smokeless tobacco: Never Used  . Alcohol Use: No  . Drug Use: No  . Sexual Activity:    Partners: Male    Birth Control/ Protection: None   Other Topics Concern  . Not on file   Social History Narrative    Review of Systems: A 12 point ROS discussed and pertinent positives are indicated in the HPI above.  All other systems are negative.  Review of Systems  Constitutional: Negative for fever, diaphoresis, activity change, appetite change and fatigue.    Vital Signs: BP 127/54 mmHg  Pulse 65  Temp(Src) 97.6 F (36.4 C) (Oral)  SpO2 99%  Physical Exam  Constitutional: She appears well-developed and well-nourished. No distress.  Abdominal: Soft. Bowel sounds are normal. She exhibits no distension and no mass. There is no tenderness. There is no guarding.  Right upper quadrant catheter site is clean, dry and intact. No signs of infection. Minimal bilious colored output in the drain bag.  Skin: She is not diaphoretic.    Imaging: Dg Chest 1 View  12/05/2014   CLINICAL DATA:  Status post right-sided thoracentesis.  EXAM: CHEST 1 VIEW  COMPARISON:  December 04, 2014.  FINDINGS: Stable cardiomediastinal silhouette. No pneumothorax is noted. Right pleural effusion noted on prior exam is significantly smaller status post thoracentesis, although mild residual effusion remains. Elevated right  hemidiaphragm is noted as well. Stable interstitial densities are noted in the left lung concerning for pulmonary edema. Bony thorax is unremarkable.  IMPRESSION: No pneumothorax status post right-sided thoracentesis. Right pleural effusion is significantly smaller compared to prior exam. Stable interstitial densities noted in left lung concerning for pulmonary edema.   Electronically Signed   By: Marijo Conception, M.D.   On: 12/05/2014 11:56   Dg Chest 2 View  12/04/2014   CLINICAL DATA:  Hypoxia.  Right upper quadrant pain.  EXAM: CHEST  2 VIEW  COMPARISON:  Included lung bases from CT abdomen/ pelvis 12/01/2014  FINDINGS: Moderate right pleural effusion with adjacent basilar airspace disease, appears increased from prior CT. Minimal blunting of left costophrenic angle, likely small left pleural effusion. There is no pulmonary edema. Heart size appears normal, mediastinal contours partially obscured. No pneumothorax. No acute osseous abnormalities are seen. A drain is present in the  right upper quadrant of the abdomen.  IMPRESSION: Increased size of right pleural effusion, now moderate in degree. Associated basilar airspace disease, likely compressive atelectasis. Small left pleural effusion.   Electronically Signed   By: Jeb Levering M.D.   On: 12/04/2014 18:51   Dg Abd 1 View  11/30/2014   CLINICAL DATA:  RIGHT lower quadrant pain, recent history of C difficile colitis.  EXAM: ABDOMEN - 1 VIEW  COMPARISON:  CT abdomen and pelvis November 25, 2014  FINDINGS: The bowel gas pattern is normal. No radio-opaque calculi or other significant radiographic abnormality are seen. No free air. Soft tissue planes and included osseous structures are nonsuspicious.  IMPRESSION: Negative.   Electronically Signed   By: Elon Alas M.D.   On: 11/30/2014 05:25   Nm Hepatobiliary Including Gb  12/01/2014   CLINICAL DATA:  Right upper quadrant abdominal pain with elevated liver function studies. History of gastric  outlet obstruction and pyloric ulcer. Initial encounter.  EXAM: NUCLEAR MEDICINE HEPATOBILIARY IMAGING  TECHNIQUE: Sequential images of the abdomen were obtained out to 60 minutes following intravenous administration of radiopharmaceutical.  RADIOPHARMACEUTICALS:  5.6 mCi Tc-3m  Choletec IV  COMPARISON:  Abdominal CT 11/25/2014.  FINDINGS: There is homogeneous hepatic activity with spontaneous opacification of the gallbladder, biliary system and small bowel. No evidence of bile leak or obstruction.  IMPRESSION: Normal examination.  The cystic and common bile ducts are patent.   Electronically Signed   By: Richardean Sale M.D.   On: 12/01/2014 11:28   US Abdomen Complete  11/25/2014   CLINICAL DATA:  Right lower quadrant pain 2 days.  EXAM: ULTRASOUND ABDOMEN COMPLETE  COMPARISON:  None.  FINDINGS: Gallbladder: No gallstones or wall thickening visualized. No sonographic Murphy sign noted.  Common bile duct: Diameter: 3.0 mm.  Liver: 1.9 cm echogenic focus within the central liver likely a small hemangioma or focal fatty change.  IVC: No abnormality visualized.  Pancreas: Visualized portion unremarkable.  Spleen: Size and appearance within normal limits.  Right Kidney: Length: 10.8 cm. Echogenicity within normal limits. No mass or hydronephrosis visualized.  Left Kidney: Length: Lower limits of normal in size measuring 8.8 cm. Echogenicity within normal limits. No mass or hydronephrosis visualized.  Abdominal aorta: No aneurysm visualized.  Other findings: None.  IMPRESSION: No acute hepatobiliary disease.  Focal echogenic center liver lesion measuring 1.9 cm likely a hemangioma versus focal fatty change.   Electronically Signed   By: Marin Olp M.D.   On: 11/25/2014 11:49   Ct Abdomen Pelvis W Contrast  12/15/2014   CLINICAL DATA:  Abdominal abscess, history of partial gastric resection and Roux-en-Y bypass. Status post anterior right upper quadrant perihepatic abscess drain 12/02/2014. Subsequent  encounter.  EXAM: CT ABDOMEN AND PELVIS WITH CONTRAST  TECHNIQUE: Multidetector CT imaging of the abdomen and pelvis was performed using the standard protocol following bolus administration of intravenous contrast.  CONTRAST:  164mL ISOVUE-300 IOPAMIDOL (ISOVUE-300) INJECTION 61%  COMPARISON:  12/05/2014  FINDINGS: Lower chest: Trace residual right pleural effusion. Minor right base subpleural atelectasis. Left lung base clear. Normal heart size. No pericardial or pleural effusion.  Abdomen: Anterior right upper quadrant subcapsular abscess drain is stable in position. Significant decrease in the fluid collection. Small amount of air/ fluid noted just lateral and superior to the drain site. No new collections present. Trace amount of subdiaphragmatic fluid.  Liver, gallbladder, biliary system, pancreas, spleen, adrenal glands, and right kidney are within normal limits for age and demonstrate no acute process.  Chronic left renal upper pole cortical scarring and thinning as before. No interval change. No renal obstruction or hydronephrosis.  Atherosclerosis of the aorta without aneurysm or occlusive process.  Negative for bowel obstruction, dilatation, ileus, or free air.  Pelvis: Minor sigmoid diverticulosis. Uterus and adnexae normal in size. Urinary bladder collapsed. No acute distal bowel process. No inguinal abnormality or hernia. No pelvic free fluid, fluid collection, hemorrhage, abscess or adenopathy.  No acute osseous finding  IMPRESSION: Nearly resolved right upper quadrant subcapsular hepatic abscess. Trace amount of air-fluid superior and lateral to the drain site.  No new abdominal or pelvic collections  Stable postoperative findings from Roux-en-Y bypass  Nearly resolved right effusion and right base atelectasis.  PLAN: Fluoroscopic drain injection to evaluate for any residual fistula.   Electronically Signed   By: Jerilynn Mages.  Jaloni Sorber M.D.   On: 12/15/2014 14:28   Ct Abdomen Pelvis W Contrast  12/01/2014    CLINICAL DATA:  Nausea, fever and right upper quadrant abdominal pain for 6 days. 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  EXAM: CT ABDOMEN AND PELVIS WITH CONTRAST  TECHNIQUE: Multidetector CT imaging of the abdomen and pelvis was performed using the standard protocol following bolus administration of intravenous contrast.  CONTRAST:  174mL OMNIPAQUE IOHEXOL 300 MG/ML  SOLN  COMPARISON:  11/25/2014  FINDINGS: Lower chest: There is a new right-sided pleural effusion with overlying atelectasis. There is also a left basilar atelectasis. The heart is normal in size. No pericardial effusion. The distal esophagus is grossly normal.  Hepatobiliary: No focal hepatic lesions or intrahepatic biliary dilatation. There are this subdiaphragmatic abscesses anterior to the liver. The upper abscess measures a maximum 5 cm in the lower abscess measures a maximum of 7 cm. No intrahepatic abscess. There is inflammatory phlegmon in the right upper quadrant near the patient's prior gastric bypass surgery. I do not see any definite leaking oral contrast but I assume is a perforation that is sealed off. There is fluid between the stomach and the liver.  Pancreas: No mass, inflammation or ductal dilatation.  Spleen: No focal lesions.  Normal size.  Adrenals/Urinary Tract: Stable scarring changes involving the upper pole region of left kidney. The adrenal glands and right kidney are normal.  Stomach/Bowel: There is fairly marked wall thickening involving the residual stomach suggesting gastritis or peptic ulcer disease. A perforated peptic ulcer is possible but the jejunal loop of small bowel at the anastomosis appears normal. No findings for small bowel obstruction. No free air. The colon is unremarkable except for moderate stool.  Vascular/Lymphatic: Stable aortic calcifications. The branch vessels are patent. The major venous structures are patent. Probable inflammatory/hyperplastic right upper quadrant lymph nodes. No  retroperitoneal adenopathy.  Other: The uterus and ovaries are normal. The bladder is normal. No pelvic mass. There is a small amount of free pelvic fluid. No inguinal mass or adenopathy.  Musculoskeletal: No significant bony findings.  IMPRESSION: 1. Persistent inflammatory phlegmon involving the space between the liver and the stomach. Interval development of perihepatic abscesses as described above. This could be due to anastomotic breakdown along the hepatic margin of the stomach which has sealed off leak or perforated peptic ulcer. The stomach wall is markedly thickened. No obvious leaking oral contrast. 2. New right pleural effusion and overlying atelectasis.   Electronically Signed   By: Marijo Sanes M.D.   On: 12/01/2014 19:28   Ct Abdomen Pelvis W Contrast  11/25/2014   CLINICAL DATA:  Right upper quadrant pain for  3 4 days, Clostridium difficile infection for 5 weeks  EXAM: CT ABDOMEN AND PELVIS WITH CONTRAST  TECHNIQUE: Multidetector CT imaging of the abdomen and pelvis was performed using the standard protocol following bolus administration of intravenous contrast.  CONTRAST:  182mL OMNIPAQUE IOHEXOL 300 MG/ML  SOLN  COMPARISON:  None.  FINDINGS: Lower chest:  No significant opacities at the lung bases  Hepatobiliary: Negative  Pancreas: Negative  Spleen: Negative  Adrenals/Urinary Tract: Small nonobstructing calculi right kidney. Scarring upper pole left kidney.  Stomach/Bowel: Status post gastric bypass. Nonobstructive gas pattern. No significant smaller large bowel wall thickening. Gastrojejunostomy noted. Moderate inflammation in the vicinity of the hepatic flexure of the colon, primarily located in the abdominal fat immediately anterior and primarily cranial to the flexure. No evidence of pneumatosis or pneumoperitoneum.  Vascular/Lymphatic: Mild aortic calcification.  Reproductive: No significant abnormalities  Other: No ascites  Musculoskeletal: negative  IMPRESSION: Acute inflammatory change  in the fat medially anterior to the hepatic flexure of the colon. Differential diagnostic possibilities include postoperative fat necrosis, epiploic appendagitis, focal colitis, or mass, which is less likely.   Electronically Signed   By: Skipper Cliche M.D.   On: 11/25/2014 14:29   Ir Sinus/fist Tube Chk-non Gi  12/05/2014   CLINICAL DATA:  59 year old female with a history of anterior abdominal fluid collection of the right upper quadrant.  Ultrasound-guided drainage was performed 12/02/2014.  EXAM: DRAINAGE CATHETER INJECTION  TECHNIQUE: Indwelling catheter was injected under fluoroscopy.  FLUOROSCOPY TIME:  12 seconds  COMPARISON:  CT 12/01/2014, ultrasound 12/02/2014  FINDINGS: Patient is position supine position on the fluoroscopy table in the Interventional Radiology suite.  Small amount of contrast was infused through the indwelling catheter in the right upper quadrant.  No significant cavity size was present surrounding the pigtail catheter drain. No fistulous connection to bowel identified.  IMPRESSION: Injection of catheter in the right upper quadrant demonstrates no fistulous connection to bowel. No significant abscess cavity remains.  Signed,  Dulcy Fanny. Earleen Newport DO  [facility]  [facility]  PLAN: Patient should maintain a log of al put daily after discharge.  Would refer to [facility] drain clinic in 1-2 weeks after discharge for a drain check.   Electronically Signed   By: Corrie Mckusick D.O.   On: 12/05/2014 17:42   Dg Abd 2 Views  12/01/2014   CLINICAL DATA:  Right upper quadrant pain for 1 week.  EXAM: ABDOMEN - 2 VIEW  COMPARISON:  November 30, 2014  FINDINGS: The bowel gas pattern is normal. Moderate bowel content is identified in the colon. There is no evidence of free air. Surgical sutures identified in the left upper and mid abdomen. No radio-opaque calculi or other significant radiographic abnormality is seen. There is mild  atelectasis of left lung base.  IMPRESSION: No bowel obstruction or free air. Moderate bowel content identified throughout colon.   Electronically Signed   By: Abelardo Diesel M.D.   On: 12/01/2014 12:27   Dg Abd Acute W/chest  12/09/2014   CLINICAL DATA:  Low-grade fever.  Abdominal abscess.  Abdominal pain  EXAM: DG ABDOMEN ACUTE W/ 1V CHEST  COMPARISON:  CT abdomen 12/01/2014  FINDINGS: Mild to moderate right pleural effusion and right lower lobe airspace disease unchanged from 12/05/2014. No pneumothorax. Left lung clear. Right arm PICC tip in the SVC.  Two-view abdomen reveals pigtail catheter right upper quadrant in satisfactory position. Air-fluid levels in the transverse colon suggesting mild ileus. No  small bowel obstruction. Air in the rectum. Surgical bowel clips overlying the stomach and in the left abdomen. Negative for free air. No renal calculi.  IMPRESSION: No change right pleural effusion and right lower lobe airspace disease.  Mild colonic ileus. Right upper quadrant drainage catheter in satisfactory position.   Electronically Signed   By: Franchot Gallo M.D.   On: 12/09/2014 13:37   Dg Duanne Limerick W/water Sol Cm  12/03/2014   CLINICAL DATA:  Abscesses around the stomach and liver. History of peptic ulcer disease and distal antrectomy with gastrojejunostomy. Concern for anastomotic leak.  EXAM: WATER SOLUBLE UPPER GI SERIES  TECHNIQUE: Single-column upper GI series was performed using water soluble contrast.  CONTRAST:  22mL OMNIPAQUE IOHEXOL 300 MG/ML  SOLN  COMPARISON:  CT 12/01/2014.  FLUOROSCOPY TIME:  Radiation Exposure Index (as provided by the fluoroscopic device):  If the device does not provide the exposure index:  Fluoroscopy Time (in minutes and seconds):  1 minutes 24 seconds  Number of Acquired Images:  18  FINDINGS: No esophageal stricture. No evidence of leak from the stomach. There is mild fold thickening in the distal stomach suggesting gastritis. There is prompt emptying of the  stomach into the jejunum. No anastomotic leak. No evidence of small bowel obstruction.  IMPRESSION: No evidence of anastomotic leak or obstruction. Mild fold thickening in the distal stomach likely reflects gastritis.   Electronically Signed   By: Rolm Baptise M.D.   On: 12/03/2014 12:53   [place] Image Guided Drainage By Percutaneous Catheter  12/02/2014   CLINICAL DATA:  59 year old female with a history of prior gastric bypass procedure with subsequent postoperative fluid collection in the upper abdomen. She has been referred for drainage.  EXAM: ULTRASOUND GUIDED DRAINAGE OF RIGHT UPPER QUADRANT FLUID COLLECTION  MEDICATIONS: 2.0 mg IV Versed; 200 mcg IV Fentanyl  Total Moderate Sedation Time: 20  PROCEDURE: The procedure, risks, benefits, and alternatives were explained to the patient. Questions regarding the procedure were encouraged and answered. The patient understands and consents to the procedure.  Patient is position supine position on a stretcher. Ultrasound survey of the upper abdomen was performed with images stored and sent to PACs.  The right upper quadrant was prepped with chlorhexidine in a sterile fashion, and a sterile drape was applied covering the operative field. A sterile gown and sterile gloves were used for the procedure. Local anesthesia was provided with 1% Lidocaine.  Using ultrasound guidance the skin and subcutaneous tissues were generously infiltrated with 1% lidocaine to the level of the abdominal wall.  Using ultrasound imaging a 10 French drain was advanced into the fluid collection using a trocar technique.  Once the drain was in place approximately 90 cc of bilious fluid was aspirated. A sample sent to the lab.  Retention suture was placed in the drain was placed to gravity bag.  Patient tolerated the procedure well and remained hemodynamically stable throughout.  No complications encountered and no significant blood loss encounter.  COMPLICATIONS: None.  FINDINGS: Ultrasound  images demonstrate fluid collection overlying the liver.  90 cc of bilious fluid was drained from the fluid collection.  IMPRESSION: Status post ultrasound-guided drain placement into right upper quadrant fluid collection with 90 cc of bilious fluid aspirated.  Signed,  Dulcy Fanny. Earleen Newport, DO  [facility]  [facility]   Electronically Signed   By: Corrie Mckusick D.O.   On: 12/02/2014 18:08   US Thoracentesis Asp Pleural Space W/img Guide  12/05/2014  INDICATION: Symptomatic right sided pleural effusion  EXAM: US THORACENTESIS ASP PLEURAL SPACE W/IMG GUIDE  COMPARISON:  CXR 12/04/2014.  MEDICATIONS: None  COMPLICATIONS: None immediate  TECHNIQUE: Informed written consent was obtained from the patient after a discussion of the risks, benefits and alternatives to treatment. A timeout was performed prior to the initiation of the procedure.  Initial ultrasound scanning demonstrates a right pleural effusion. The lower chest was prepped and draped in the usual sterile fashion. 1% lidocaine was used for local anesthesia.  Under direct ultrasound guidance, a 19 gauge, 7-cm, Yueh catheter was introduced. An ultrasound image was saved for documentation purposes. The thoracentesis was performed. The catheter was removed and a dressing was applied. The patient tolerated the procedure well without immediate post procedural complication. The patient was escorted to have an upright chest radiograph.  FINDINGS: A total of approximately 700 ml of serous fluid was removed. Requested samples were sent to the laboratory.  IMPRESSION: Successful ultrasound-guided right sided thoracentesis yielding 700 ml of pleural fluid.  Read By:  Tsosie Billing PA-C   Electronically Signed   By: Corrie Mckusick D.O.   On: 12/05/2014 13:08    Labs:  CBC:  Recent Labs  12/06/14 0024 12/07/14 0519 12/08/14 0257 12/10/14 0500  WBC 14.9* 14.1* 13.4* 10.2  HGB 10.1* 10.4* 9.9* 10.7*  HCT 30.0* 32.6*  31.0* 33.3*  PLT 279 384 431* 498*    COAGS:  Recent Labs  12/02/14 0759  INR 1.20  APTT 28    BMP:  Recent Labs  12/06/14 0024 12/07/14 0519 12/08/14 0257 12/09/14 0523 12/10/14 0500  NA 136  --  137 136 137  K 2.8* 3.3* 4.8 4.1 4.6  CL 98*  --  100* 96* 99*  CO2 30  --  30 29 29   GLUCOSE 113*  --  118* 110* 105*  BUN <5*  --  <5* 7 12  CALCIUM 8.0*  --  8.7* 8.9 8.8*  CREATININE 0.53  --  0.58 0.65 0.71  GFRNONAA >60  --  >60 >60 >60  GFRAA >60  --  >60 >60 >60    LIVER FUNCTION TESTS:  Recent Labs  12/01/14 0522 12/03/14 1219 12/05/14 1710 12/06/14 0024 12/09/14 1526  BILITOT 0.4 0.9 0.5 0.7 0.5  AST 19  --  186* 112* 106*  ALT 36  --  154* 134* 151*  ALKPHOS 95  --  226* 213* 291*  PROT 6.6  --  5.5* 5.6* 7.2  ALBUMIN 2.8*  --  1.8* 1.9* 2.2*     Assessment and Plan:  CT confirms near-complete resolution of the right upper quadrant subcapsular perihepatic collection. 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  Plan: Discontinue drain flushing. Keep to gravity drainage. Follow-up drain injection only in 3 weeks. Discussed with Dr. Johney Maine.  SignedGreggory Keen 12/15/2014, 2:33 PM   I spent a total of    15 Minutes in face to face in clinical consultation, greater than 50% of which was counseling/coordinating care for this patient with a right upper quadrant subcapsular perihepatic abscess drain.

## 2014-12-15 NOTE — Progress Notes (Signed)
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  Discussed with interventional radiology. Dr. Annamaria Boots & I agreed to repeat drain study only in a few weeks. No more abscess cavity so no need for repeat CT scan. I would like to wait 3 weeks until another patient's nutrition is better. Interventional radiology to set this up.  Thanks,  [name], M.D., F.A.C.S. Gastrointestinal and Minimally Invasive Surgery [facility] [address] [place], [place] [address] [phone] Main / Paging   ______

## 2014-12-19 ENCOUNTER — Telehealth: Payer: Self-pay | Admitting: Internal Medicine

## 2014-12-19 DIAGNOSIS — F419 Anxiety disorder, unspecified: Secondary | ICD-10-CM | POA: Diagnosis not present

## 2014-12-19 DIAGNOSIS — K219 Gastro-esophageal reflux disease without esophagitis: Secondary | ICD-10-CM | POA: Diagnosis not present

## 2014-12-19 DIAGNOSIS — K651 Peritoneal abscess: Secondary | ICD-10-CM | POA: Diagnosis not present

## 2014-12-19 DIAGNOSIS — E43 Unspecified severe protein-calorie malnutrition: Secondary | ICD-10-CM | POA: Diagnosis not present

## 2014-12-19 NOTE — Telephone Encounter (Signed)
Patient called stating that she is having night sweats, unable to sleep, jittery stomach, patient states that she can't live like this any longer. She called Dr. Johney Maine office and they advised her to call Dr. Baxter Flattery. I asked the patient had she called her PCP and she stated no. I advised her that this sounded like a primary care concern unless she felt like it was related to her C-DIFF diagnosis or Vancomycin she states no it wasn't. Patient was adiment that I give Dr. Baxter Flattery the message and ask her or a nurse to call the patient back.  Please advise

## 2014-12-20 ENCOUNTER — Telehealth: Payer: Self-pay | Admitting: Internal Medicine

## 2014-12-20 ENCOUNTER — Telehealth: Payer: Self-pay | Admitting: Gastroenterology

## 2014-12-20 DIAGNOSIS — K651 Peritoneal abscess: Principal | ICD-10-CM

## 2014-12-20 DIAGNOSIS — A0472 Enterocolitis due to Clostridium difficile, not specified as recurrent: Secondary | ICD-10-CM

## 2014-12-20 DIAGNOSIS — T814XXS Infection following a procedure, sequela: Principal | ICD-10-CM

## 2014-12-20 DIAGNOSIS — IMO0001 Reserved for inherently not codable concepts without codable children: Secondary | ICD-10-CM

## 2014-12-20 NOTE — Telephone Encounter (Signed)
Patient has a referral in the system from Dr., Silvio Pate to transfer to anther GI for a second opinion.  She also sees Dr. Baxter Flattery this week.  She will ask her about referral to [facility] at [facility].

## 2014-12-20 NOTE — Telephone Encounter (Signed)
Referral placed.

## 2014-12-20 NOTE — Telephone Encounter (Signed)
i will call her back this am to check in on her

## 2014-12-20 NOTE — Telephone Encounter (Signed)
Ok thank you 

## 2014-12-20 NOTE — Telephone Encounter (Signed)
Shelly Hood daughter called [name] wants to go ahead with gi referral She would like to go to [facility] [place]  Which ever can see her first Dr office stated they need medical records Pt would like referral ASAP

## 2014-12-21 ENCOUNTER — Other Ambulatory Visit: Payer: Self-pay | Admitting: *Deleted

## 2014-12-21 MED ORDER — ALPRAZOLAM 0.25 MG PO TABS: 0.2500 mg | ORAL_TABLET | Freq: Every day | ORAL | Status: DC

## 2014-12-21 NOTE — Telephone Encounter (Signed)
Approved: written for #90 x 0 1 daily prn

## 2014-12-21 NOTE — Telephone Encounter (Signed)
Not sure when the last time she had a refill, form on your desk from [facility]

## 2014-12-21 NOTE — Telephone Encounter (Signed)
rx faxed to pharmacy [facility] to [facility]

## 2014-12-21 NOTE — Telephone Encounter (Signed)
i have called pt and left message

## 2014-12-22 ENCOUNTER — Ambulatory Visit (INDEPENDENT_AMBULATORY_CARE_PROVIDER_SITE_OTHER): Payer: BLUE CROSS/BLUE SHIELD | Admitting: Internal Medicine

## 2014-12-22 ENCOUNTER — Encounter: Payer: Self-pay | Admitting: Internal Medicine

## 2014-12-22 VITALS — BP 110/68 | HR 76 | Temp 97.8°F | Ht 59.0 in | Wt 134.0 lb

## 2014-12-22 DIAGNOSIS — Z8619 Personal history of other infectious and parasitic diseases: Secondary | ICD-10-CM

## 2014-12-22 DIAGNOSIS — E43 Unspecified severe protein-calorie malnutrition: Secondary | ICD-10-CM | POA: Diagnosis not present

## 2014-12-22 DIAGNOSIS — Z8719 Personal history of other diseases of the digestive system: Secondary | ICD-10-CM | POA: Diagnosis not present

## 2014-12-22 LAB — CBC WITH DIFFERENTIAL/PLATELET
BASOS PCT: 0 % (ref 0–1)
Basophils Absolute: 0 10*3/uL (ref 0.0–0.1)
EOS ABS: 0.1 10*3/uL (ref 0.0–0.7)
Eosinophils Relative: 1 % (ref 0–5)
HCT: 37.3 % (ref 36.0–46.0)
HEMOGLOBIN: 12.2 g/dL (ref 12.0–15.0)
LYMPHS ABS: 2 10*3/uL (ref 0.7–4.0)
Lymphocytes Relative: 21 % (ref 12–46)
MCH: 27.4 pg (ref 26.0–34.0)
MCHC: 32.7 g/dL (ref 30.0–36.0)
MCV: 83.6 fL (ref 78.0–100.0)
MONO ABS: 0.5 10*3/uL (ref 0.1–1.0)
MONOS PCT: 5 % (ref 3–12)
MPV: 8.8 fL (ref 8.6–12.4)
NEUTROS ABS: 6.9 10*3/uL (ref 1.7–7.7)
Neutrophils Relative %: 73 % (ref 43–77)
Platelets: 433 10*3/uL — ABNORMAL HIGH (ref 150–400)
RBC: 4.46 MIL/uL (ref 3.87–5.11)
RDW: 17.6 % — ABNORMAL HIGH (ref 11.5–15.5)
WBC: 9.4 10*3/uL (ref 4.0–10.5)

## 2014-12-22 MED ORDER — VANCOMYCIN HCL 125 MG PO CAPS: 125.0000 mg | ORAL_CAPSULE | Freq: Three times a day (TID) | ORAL | Status: DC

## 2014-12-22 NOTE — Progress Notes (Signed)
Subjective:    Patient ID: Shelly Hood, female    DOB: 28-Apr-1955, 59 y.o.   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 She was recently hospitalized with intra-abdominal abscess thought to be related to duodenal stump leak which has sealed off, but still had evidence of fluid collection slowly decreasing in size with drainage in place as well as antibiotics.  She reports only having 1 bm in the morning, semi formed, yellow stool, disintegrates when flushed.  Eating frequent protein small meals per dr. Johney Maine' directions Allergies  Allergen Reactions  . Cefotetan Other (See Comments)    Thrombocytopenia, plat ct 18K  . Naproxen     ulcers  . Nsaids     DUODENAL ULCER  . Aspirin     Ulcers   . Ibuprofen     ulcers   . Toradol [Ketorolac Tromethamine]     ulcers  . Codeine Sulfate Nausea Only    REACTION: nausea only tolerates hydrocodone  . Ensure [Nutritional Supplements] Diarrhea    Dumping syndrome  . Flagyl [Metronidazole] Other (See Comments)    Weak, bad taste    Current Outpatient Prescriptions on File Prior to Visit  Medication Sig Dispense Refill  . acetaminophen (TYLENOL) 500 MG tablet Take 1,000 mg by mouth every 6 (six) hours as needed for mild pain or fever (pain and fever).     . ALPRAZolam (XANAX) 0.25 MG tablet Take 1 tablet (0.25 mg total) by mouth daily. 90 tablet 0  . atorvastatin (LIPITOR) 20 MG tablet Take 1 tablet (20 mg total) by mouth daily at 6 PM. (Patient taking differently: Take 20 mg by mouth daily. ) 90 tablet 3  . famotidine (PEPCID) 40 MG tablet Take 1 tablet (40 mg total) by mouth 2 (two) times daily. 60 tablet 0  . FLUoxetine (PROZAC) 40 MG capsule Take 1 capsule (40 mg total) by mouth daily. 90 capsule 3  . lidocaine (XYLOCAINE) 2 % solution Use as directed 20 mLs in the mouth or throat  as needed for mouth pain. 100 mL 0  . oxyCODONE (OXY IR/ROXICODONE) 5 MG immediate release tablet Take 1 tablet (5 mg total) by mouth every 6 (six) hours as needed for moderate pain, severe pain or breakthrough pain. 20 tablet 0  . saccharomyces boulardii (FLORASTOR) 250 MG capsule Take 500 mg by mouth daily.    . piperacillin-tazobactam (ZOSYN) 3.375 GM/50ML IVPB Inject 50 mLs (3.375 g total) into the vein every 8 (eight) hours. (Patient not taking: Reported on 12/22/2014) 50 mL 1   No current facility-administered medications on file prior to visit.   Active Ambulatory Problems    Diagnosis Date Noted  . Hyperlipemia 07/30/2006  . Anxiety disorder 07/30/2006  . ALLERGIC RHINITIS 07/30/2006  . OSTEOPENIA 10/14/2006  . Obesity 04/15/2011  . 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 Clostridium difficile colitis 11/30/2014  . Leukocytosis 11/30/2014  . Elevated LFTs   . RUQ pain   . RUQ abd abscess, possible delayed duodenal stump leak   . Peptic ulcer disease 12/02/2014  . Elevated WBC count   . Fever   . Gastritis 12/05/2014  . Protein-calorie malnutrition, severe ([place]) 12/05/2014  . Hypoxia   . S/P distal gastrectomy/vagotomy with Roux-en-Y reconstruction - SHE HAS NOT HAD A GASTRIC BYPASS 12/08/2014  . Severe protein-energy malnutrition ([place]) 12/09/2014  . Postoperative intra-abdominal abscess ([place])  Resolved Ambulatory Problems    Diagnosis Date Noted  . TOBACCO ABUSE 07/30/2006  . DEPRESSION 07/30/2006  . GERD 02/22/2009  . LIVER FUNCTION TESTS, ABNORMAL 08/22/2008  . Acute upper respiratory infections of unspecified site 03/06/2011  . Routine general medical examination at a health care facility 04/15/2011  . Viral wart 04/15/2011  . Upper respiratory infection 05/28/2013  . Abdominal pain, epigastric 11/23/2013  . Dermatofibroma of trunk 11/23/2013  . 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 Thrombocytopenia ([place]) 06/26/2014  . Diarrhea  09/15/2014  . Hx of Clostridium difficile infection 09/15/2014  . Clostridium difficile diarrhea 11/30/2014  . Abdominal pain   . Abnormal CT scan, colon   . Abdominal abscess ([place])   . C. difficile colitis    Past Medical History  Diagnosis Date  . Panic attacks   . High cholesterol   . Gastric outlet obstruction   . Pyloric ulcer   . Depression   . GERD (gastroesophageal reflux disease)   . Pyloric stenosis   . Clostridium difficile infection     Review of Systems     Objective:   Physical Exam BP 110/68 mmHg  Pulse 76  Temp(Src) 97.8 F (36.6 C) (Oral)  Ht 4\' 11"  (1.499 m)  Wt 134 lb (60.782 kg)  BMI 27.05 kg/m2 Physical Exam  Constitutional:  oriented to person, place, and time. appears well-developed and well-nourished. No distress.  HENT: /AT, PERRLA, no scleral icterus Mouth/Throat: Oropharynx is clear and moist. No oropharyngeal exudate.  Cardiovascular: Normal rate, regular rhythm and normal heart sounds. Exam reveals no gallop and no friction rub.  No murmur heard.  Pulmonary/Chest: Effort normal and breath sounds normal. No respiratory distress.  has no wheezes.  Neck = supple, no nuchal rigidity Abdominal: Soft. Bowel sounds are normal.  exhibits no distension. There is no tenderness. RUQ drain in place Lymphadenopathy: no cervical adenopathy. 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 Will finish out oral vancomycin taper  Intra-abdominal abscess = she finished IV antibiotics. No need for further course. Repeat imaging in the coming 2 wks per dr. Johney Maine to determine when to remove drain  Malnutrition = check pre alb

## 2014-12-23 LAB — PREALBUMIN: PREALBUMIN: 31 mg/dL (ref 17–34)

## 2015-01-03 ENCOUNTER — Ambulatory Visit
Admission: RE | Admit: 2015-01-03 | Discharge: 2015-01-03 | Disposition: A | Discharge status: Home / Self Care | Payer: BLUE CROSS/BLUE SHIELD | Source: Ambulatory Visit | Attending: Interventional Radiology | Admitting: Interventional Radiology

## 2015-01-03 ENCOUNTER — Inpatient Hospital Stay: Payer: BLUE CROSS/BLUE SHIELD | Admitting: Internal Medicine

## 2015-01-03 DIAGNOSIS — T814XXD Infection following a procedure, subsequent encounter: Principal | ICD-10-CM

## 2015-01-03 DIAGNOSIS — K651 Peritoneal abscess: Principal | ICD-10-CM

## 2015-01-03 DIAGNOSIS — IMO0001 Reserved for inherently not codable concepts without codable children: Secondary | ICD-10-CM

## 2015-01-05 ENCOUNTER — Other Ambulatory Visit: Payer: BLUE CROSS/BLUE SHIELD

## 2015-01-26 ENCOUNTER — Ambulatory Visit: Payer: BLUE CROSS/BLUE SHIELD | Admitting: Internal Medicine

## 2015-03-20 ENCOUNTER — Other Ambulatory Visit: Payer: Self-pay | Admitting: *Deleted

## 2015-03-20 MED ORDER — ALPRAZOLAM 0.25 MG PO TABS: 0.2500 mg | ORAL_TABLET | Freq: Every day | ORAL | Status: DC

## 2015-03-20 NOTE — Telephone Encounter (Signed)
12/21/2014, rx to be printed to fax into [facility] on faxed form.

## 2015-03-20 NOTE — Telephone Encounter (Signed)
Form and script faxed to [facility]

## 2015-03-20 NOTE — Telephone Encounter (Signed)
Approved: okay #[id number] x 0 on form

## 2015-04-26 ENCOUNTER — Ambulatory Visit (INDEPENDENT_AMBULATORY_CARE_PROVIDER_SITE_OTHER): Payer: BLUE CROSS/BLUE SHIELD | Admitting: Family Medicine

## 2015-04-26 ENCOUNTER — Encounter: Payer: Self-pay | Admitting: Family Medicine

## 2015-04-26 VITALS — BP 122/64 | HR 65 | Temp 97.7°F | Ht 59.0 in | Wt 140.2 lb

## 2015-04-26 DIAGNOSIS — M67911 Unspecified disorder of synovium and tendon, right shoulder: Secondary | ICD-10-CM

## 2015-04-26 DIAGNOSIS — M7541 Impingement syndrome of right shoulder: Secondary | ICD-10-CM | POA: Diagnosis not present

## 2015-04-26 MED ORDER — METHYLPREDNISOLONE ACETATE 40 MG/ML IJ SUSP
80.0000 mg | Freq: Once | INTRAMUSCULAR | Status: AC
  Administered 2015-04-26: 80 mg via INTRA_ARTICULAR

## 2015-04-26 NOTE — Patient Instructions (Signed)

## 2015-04-26 NOTE — Progress Notes (Signed)
Dr. Frederico Hamman T. Treysean Petruzzi, MD, [facility] [place], [address] Phone: [phone] Fax: [phone]  04/26/2015  Patient: Shelly Hood, MRN: [id number], DOB: 1955-12-01, 60 y.o.  Primary Physician:  Viviana Simpler, MD   Chief Complaint  Patient presents with  . Shoulder Pain    Right   Subjective:   This 60 y.o. female patient noted above presents with shoulder pain that has been ongoing for 2-3 years. there is no history of trauma or accident recently The patient denies neck pain or radicular symptoms. Denies dislocation, subluxation, separation of the shoulder. The patient does complain of pain in the overhead plane with significant painful arc of motion.  For several years, R shoulder is bothreing her. Vacuuming will hurt and keep awake at night. ? Pops.  Pain with ext and abd.   Medications Tried: Tylenol Ice or Heat: minimally helpful Tried PT: No  Prior shoulder Injury: No Prior surgery: No Prior fracture: No  The PMH, PSH, Social History, Family History, Medications, and allergies have been reviewed in [facility], and have been updated if relevant.  Patient Active Problem List   Diagnosis Date Noted  . Postoperative intra-abdominal abscess ([place])   . Severe protein-energy malnutrition ([place]) 12/09/2014  . S/P distal gastrectomy/vagotomy with Roux-en-Y reconstruction - SHE HAS NOT HAD A GASTRIC BYPASS 12/08/2014  . Hypoxia   . Gastritis 12/05/2014  . Protein-calorie malnutrition, severe ([place]) 12/05/2014  . Elevated WBC count   . Fever   . Peptic ulcer disease 12/02/2014  . RUQ pain   . RUQ abd abscess, possible delayed duodenal stump leak   . Clostridium difficile colitis 11/30/2014  . Leukocytosis 11/30/2014  . Elevated LFTs   . 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 Obesity 04/15/2011  . OSTEOPENIA 10/14/2006  . Hyperlipemia 07/30/2006  . Anxiety disorder  07/30/2006  . ALLERGIC RHINITIS 07/30/2006    Past Medical History  Diagnosis Date  . Panic attacks   . High cholesterol   . Obesity   . Gastric outlet obstruction   . Pyloric ulcer   . Depression   . GERD (gastroesophageal reflux disease)   . Pyloric stenosis   . Clostridium difficile infection   . Clostridium difficile colitis 11/30/2014  . Peptic ulcer disease 12/02/2014  . Thrombocytopenia ([place]) 06/26/2014    Past Surgical History  Procedure Laterality Date  . Rhinoplasty      X3  . Colonscopy  AGE 33  . Vagotomy N/A 06/24/2014    Procedure: ROBOTIC TRUNCAL VAGOTOMY ;  Surgeon: Michael Boston, MD;  Location: [facility];  Service: General;  Laterality: N/A;  This is [name]'s preference  . Antrectomy N/A 06/24/2014    Procedure: ROBOTIC DISTAL GASTRECTOMY;  Surgeon: Michael Boston, MD;  Location: [facility];  Service: General;  Laterality: N/A;  . Laparoscopic roux-en-y gastric bypass with hiatal hernia repair N/A 06/24/2014    Procedure: ROBOTIC ROUX-EN-Y CONSTRUCTION WITH HIATAL HERNIA REPAIR;  Surgeon: Michael Boston, MD;  Location: [facility];  Service: General;  Laterality: N/A;  . Colonoscopy N/A 10/19/2014    Procedure: COLONOSCOPY;  Surgeon: Gatha Mayer, MD;  Location: [facility];  Service: Endoscopy;  Laterality: N/A;  with FMT  . Fecal transplant N/A 10/19/2014    Procedure: FECAL TRANSPLANT;  Surgeon: Gatha Mayer, MD;  Location: [facility];  Service: Endoscopy;  Laterality: N/A;    Social History   Social History  . Marital Status: Married  Spouse Name: N/A  . Number of Children: 2  . Years of Education: N/A   Occupational History  . Real estate management     [employer] in [place] Topics  . Smoking status: Former Smoker -- 0.25 packs/day for 30 years    Types: Cigarettes    Quit date: 06/18/2014  . Smokeless tobacco: Never Used  . Alcohol Use: No  . Drug Use: No  . Sexual Activity:    Partners: Male    Birth Control/ Protection: None     Other Topics Concern  . Not on file   Social History Narrative    Family History  Problem Relation Age of Onset  . Diabetes Mother   . Diabetes Maternal Grandmother   . Heart disease Father   . Colon cancer Neg Hx     Allergies  Allergen Reactions  . Cefotetan Other (See Comments)    Thrombocytopenia, plat ct 18K  . Naproxen     ulcers  . Nsaids     DUODENAL ULCER  . Aspirin     Ulcers   . Ibuprofen     ulcers   . Toradol [Ketorolac Tromethamine]     ulcers  . Codeine Sulfate Nausea Only    REACTION: nausea only tolerates hydrocodone  . Ensure [Nutritional Supplements] Diarrhea    Dumping syndrome  . Flagyl [Metronidazole] Other (See Comments)    Weak, bad taste     Medication list reviewed and updated in full in [place].  GEN: No fevers, chills. Nontoxic. Primarily MSK c/o today. MSK: Detailed in the HPI GI: tolerating PO intake without difficulty Neuro: No numbness, parasthesias, or tingling associated. Otherwise the pertinent positives of the ROS are noted above.   Objective:   Blood pressure 122/64, pulse 65, temperature 97.7 F (36.5 C), temperature source Oral, height 4\' 11"  (1.499 m), weight 140 lb 4 oz (63.617 kg).  GEN: Well-developed,well-nourished,in no acute distress; alert,appropriate and cooperative throughout examination HEENT: Normocephalic and atraumatic without obvious abnormalities. Ears, externally no deformities PULM: Breathing comfortably in no respiratory distress EXT: No clubbing, cyanosis, or edema PSYCH: Normally interactive. Cooperative during the interview. Pleasant. Friendly and conversant. Not anxious or depressed appearing. Normal, full affect.  Shoulder: R Inspection: No muscle wasting or winging Ecchymosis/edema: neg  AC joint, scapula, clavicle: NT Cervical spine: NT, full ROM Spurling's: neg Abduction: full, 5/5 Flexion: full, 5/5 IR, full, lift-off: 5/5 ER at neutral: full, 5/5 AC crossover:  neg Neer: pos Hawkins: pos Drop Test: neg Empty Can: pos Supraspinatus insertion: mild-mod T Bicipital groove: NT Speed's: neg Yergason's: neg Sulcus sign: neg Scapular dyskinesis: none C5-T1 intact  Neuro: Sensation intact Grip 5/5   Radiology: No results found.  Assessment and Plan:    Impingement syndrome of right shoulder - Plan: Ambulatory referral to Physical Therapy, methylPREDNISolone acetate (DEPO-MEDROL) injection 80 mg  Tendinopathy of rotator cuff, right - Plan: Ambulatory referral to Physical Therapy, methylPREDNISolone acetate (DEPO-MEDROL) injection 80 mg  Rotator cuff strengthening and scapular stabilization exercises were reviewed with the patient.  [facility] RTC and scapular stabilization program or MOON shoulder protocol given to the patient. Retraining shoulder mechanics and function was emphasized to the patient with rehab done at least 5-6 days a week.  The patient could benefit from formal PT to assist with scapular stabilization and RTC strengthening.  SubAC Injection, R Verbal consent was obtained from the patient. Risks (including rare infection), benefits, and alternatives were explained. Patient prepped with Chloraprep and  Ethyl Chloride used for anesthesia. The subacromial space was injected using the posterior approach. The patient tolerated the procedure well and had decreased pain post injection. No complications. Injection: 8 cc of Lidocaine 1% and 2 mL of Depo-Medrol 40 mg. Needle: 22 gauge    Follow-up: Return in about 6 weeks (around 06/07/2015).  New Prescriptions   No medications on file   Modified Medications   No medications on file   Orders Placed This Encounter  Procedures  . Ambulatory referral to Physical Therapy    Signed,  Frederico Hamman T. Takhia Spoon, MD   Patient's Medications  New Prescriptions   No medications on file  Previous Medications   ACETAMINOPHEN (TYLENOL) 500 MG TABLET    Take 1,000 mg by mouth every 6 (six) hours  as needed for mild pain or fever (pain and fever).    ALPRAZOLAM (XANAX) 0.25 MG TABLET    Take 1 tablet (0.25 mg total) by mouth daily.   ATORVASTATIN (LIPITOR) 20 MG TABLET    Take 1 tablet (20 mg total) by mouth daily at 6 PM.   FAMOTIDINE (PEPCID) 40 MG TABLET    Take 1 tablet (40 mg total) by mouth 2 (two) times daily.   FLUOXETINE (PROZAC) 40 MG CAPSULE    Take 1 capsule (40 mg total) by mouth daily.   LIDOCAINE (XYLOCAINE) 2 % SOLUTION    Use as directed 20 mLs in the mouth or throat as needed for mouth pain.   OXYCODONE (OXY IR/ROXICODONE) 5 MG IMMEDIATE RELEASE TABLET    Take 1 tablet (5 mg total) by mouth every 6 (six) hours as needed for moderate pain, severe pain or breakthrough pain.   SACCHAROMYCES BOULARDII (FLORASTOR) 250 MG CAPSULE    Take 500 mg by mouth daily.  Modified Medications   No medications on file  Discontinued Medications   VANCOMYCIN (VANCOCIN) 125 MG CAPSULE    Take 1 capsule (125 mg total) by mouth 3 (three) times daily. X 7 d. Then BID x 7 d. Then once daily x 7d. Then 1 tab every 3 days

## 2015-04-26 NOTE — Progress Notes (Signed)
Pre visit review using our clinic review tool, if applicable. No additional management support is needed unless otherwise documented below in the visit note. 

## 2015-05-05 ENCOUNTER — Encounter: Payer: Self-pay | Admitting: Obstetrics & Gynecology

## 2015-05-05 ENCOUNTER — Encounter: Payer: Self-pay | Admitting: *Deleted

## 2015-05-05 ENCOUNTER — Ambulatory Visit (INDEPENDENT_AMBULATORY_CARE_PROVIDER_SITE_OTHER): Payer: BLUE CROSS/BLUE SHIELD | Admitting: Obstetrics & Gynecology

## 2015-05-05 VITALS — BP 103/62 | HR 65 | Resp 18 | Ht 59.0 in | Wt 139.0 lb

## 2015-05-05 DIAGNOSIS — Z124 Encounter for screening for malignant neoplasm of cervix: Secondary | ICD-10-CM | POA: Diagnosis not present

## 2015-05-05 DIAGNOSIS — Z1151 Encounter for screening for human papillomavirus (HPV): Secondary | ICD-10-CM | POA: Diagnosis not present

## 2015-05-05 DIAGNOSIS — N9089 Other specified noninflammatory disorders of vulva and perineum: Secondary | ICD-10-CM

## 2015-05-05 DIAGNOSIS — Z01419 Encounter for gynecological examination (general) (routine) without abnormal findings: Secondary | ICD-10-CM

## 2015-05-05 NOTE — Progress Notes (Signed)
Subjective:    Shelly Hood is a 60 y.o. MW female who presents for an annual exam. The patient has no complaints today. The patient is not currently sexually active. GYN screening history: last pap: was normal. The patient wears seatbelts: yes. The patient participates in regular exercise: yes. Has the patient ever been transfused or tattooed?: no. The patient reports that there is not domestic violence in her life.   Menstrual History: OB History    No data available      Menarche age: 43  No LMP recorded. Patient is postmenopausal.  LMP 60 yo.     The following portions of the patient's history were reviewed and updated as appropriate: allergies, current medications, past family history, past medical history, past social history, past surgical history and problem list.  Review of Systems Pertinent items are noted in HPI. Married for 24 years, Secondary school teacher. mammo due, UTD on flu vaccine.   Objective:    BP 103/62 mmHg  Pulse 65  Resp 18  Ht 4\' 11"  (1.499 m)  Wt 139 lb (63.05 kg)  BMI 28.06 kg/m2  General Appearance:    Alert, cooperative, no distress, appears stated age  Head:    Normocephalic, without obvious abnormality, atraumatic  Eyes:    PERRL, conjunctiva/corneas clear, EOM's intact, fundi    benign, both eyes  Ears:    Normal TM's and external ear canals, both ears  Nose:   Nares normal, septum midline, mucosa normal, no drainage    or sinus tenderness  Throat:   Lips, mucosa, and tongue normal; teeth and gums normal  Neck:   Supple, symmetrical, trachea midline, no adenopathy;    thyroid:  no enlargement/tenderness/nodules; no carotid   bruit or JVD  Back:     Symmetric, no curvature, ROM normal, no CVA tenderness  Lungs:     Clear to auscultation bilaterally, respirations unlabored  Chest Wall:    No tenderness or deformity   Heart:    Regular rate and rhythm, S1 and S2 normal, no murmur, rub   or gallop  Breast Exam:    No tenderness, masses, or nipple  abnormality  Abdomen:     Soft, non-tender, bowel sounds active all four quadrants,    no masses, no organomegaly  Genitalia:    Normal female without lesion, discharge or tenderness, 3 mm raw, red area with white parts to it at the introitus, NSSA, NT, mobile, normal adnexal exam, I prepped the area with betadine and injected 1% lidocaine, and used a 36mm punch. Silver nitrate was used for hemostasis. She tolerated the procedure well     Extremities:   Extremities normal, atraumatic, no cyanosis or edema  Pulses:   2+ and symmetric all extremities  Skin:   Skin color, texture, turgor normal, no rashes or lesions  Lymph nodes:   Cervical, supraclavicular, and axillary nodes normal  Neurologic:   CNII-XII intact, normal strength, sensation and reflexes    throughout  .    Assessment:    Healthy female exam.   Vulvar lesion   Plan:     Mammogram. Thin prep Pap smear. with cotesting Await pathology

## 2015-05-08 LAB — CYTOLOGY - PAP

## 2015-05-09 ENCOUNTER — Telehealth: Payer: Self-pay | Admitting: *Deleted

## 2015-05-09 NOTE — Telephone Encounter (Signed)
-----   Message from Francia Greaves sent at 05/09/2015  1:32 PM EST ----- Regarding: Test Results Contact: [phone] Would like test results, also if you could ask her would she like me to schedule her mammogram appt, they wanted her to have one after her visit but she never stopped at my desk on her way out.  If she has had one before? Where at? [facility] or [facility]? Mornings or Afternoons? Any particular day of the week works better? Hx of Breast cancer-herself or family? Implants? Reduction?  Or you could give the phone back to me once your done and I will ask her.

## 2015-05-09 NOTE — Telephone Encounter (Signed)
Called Dr Hulan Fray to review surgical pathology, Dr Hulan Fray will call patient directly to discuss results.

## 2015-05-11 ENCOUNTER — Ambulatory Visit (INDEPENDENT_AMBULATORY_CARE_PROVIDER_SITE_OTHER): Payer: BLUE CROSS/BLUE SHIELD | Admitting: Obstetrics & Gynecology

## 2015-05-11 ENCOUNTER — Encounter: Payer: Self-pay | Admitting: Obstetrics & Gynecology

## 2015-05-11 VITALS — BP 113/66 | HR 89 | Resp 18 | Wt 143.0 lb

## 2015-05-11 DIAGNOSIS — D071 Carcinoma in situ of vulva: Secondary | ICD-10-CM | POA: Diagnosis not present

## 2015-05-11 NOTE — Progress Notes (Signed)
   Subjective:    Patient ID: Shelly Hood, female    DOB: 1955-10-08, 60 y.o.   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   Review of Systems     Objective:   Physical Exam  [id number] Vulvoscopy done and a wide lesion (probable VIN1 or 2) noted about a cm on each side of the previous biopsy.      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

## 2015-05-17 ENCOUNTER — Ambulatory Visit: Payer: BLUE CROSS/BLUE SHIELD | Attending: Gynecologic Oncology | Admitting: Gynecologic Oncology

## 2015-05-17 ENCOUNTER — Encounter: Payer: Self-pay | Admitting: Gynecologic Oncology

## 2015-05-17 ENCOUNTER — Ambulatory Visit
Admission: RE | Admit: 2015-05-17 | Discharge: 2015-05-17 | Disposition: A | Discharge status: Home / Self Care | Payer: BLUE CROSS/BLUE SHIELD | Source: Ambulatory Visit | Attending: Obstetrics & Gynecology | Admitting: Obstetrics & Gynecology

## 2015-05-17 VITALS — BP 137/72 | HR 65 | Temp 97.9°F | Resp 18 | Ht 59.0 in | Wt 140.7 lb

## 2015-05-17 DIAGNOSIS — K219 Gastro-esophageal reflux disease without esophagitis: Secondary | ICD-10-CM | POA: Insufficient documentation

## 2015-05-17 DIAGNOSIS — D071 Carcinoma in situ of vulva: Secondary | ICD-10-CM | POA: Diagnosis not present

## 2015-05-17 DIAGNOSIS — F410 Panic disorder [episodic paroxysmal anxiety] without agoraphobia: Secondary | ICD-10-CM | POA: Insufficient documentation

## 2015-05-17 DIAGNOSIS — F329 Major depressive disorder, single episode, unspecified: Secondary | ICD-10-CM | POA: Diagnosis not present

## 2015-05-17 DIAGNOSIS — E7800 Pure hypercholesterolemia, unspecified: Secondary | ICD-10-CM | POA: Insufficient documentation

## 2015-05-17 DIAGNOSIS — Z01419 Encounter for gynecological examination (general) (routine) without abnormal findings: Secondary | ICD-10-CM

## 2015-05-17 DIAGNOSIS — Z1231 Encounter for screening mammogram for malignant neoplasm of breast: Secondary | ICD-10-CM | POA: Diagnosis not present

## 2015-05-17 DIAGNOSIS — Z87891 Personal history of nicotine dependence: Secondary | ICD-10-CM | POA: Insufficient documentation

## 2015-05-17 DIAGNOSIS — K259 Gastric ulcer, unspecified as acute or chronic, without hemorrhage or perforation: Secondary | ICD-10-CM | POA: Diagnosis not present

## 2015-05-17 DIAGNOSIS — E669 Obesity, unspecified: Secondary | ICD-10-CM | POA: Diagnosis not present

## 2015-05-17 NOTE — Patient Instructions (Signed)
Surgery at [facility] on 05/26/15. Pre-op visit at [facility] clinic D at 9:30 on Friday 05/19/15.

## 2015-05-17 NOTE — Progress Notes (Signed)
Consult Note: Gyn-Onc  Shelly Hood 60 y.o. female  CC:  Chief Complaint  Patient presents with  . VIN III , Vulvar Intraepithelial neoplasia    New Consultation    HPI: Patient is seen today in consultation at the request of Dr. Hulan Fray.  Patient is a very pleasant 60 year old gravida 2 para 2 who has a very complicated past medical history but is doing very well. She was seen by Dr. Hulan Fray for her routine GYN care on 05/05/2015. At that time a 3 mm lobulated area of white hyperkeratosis at the level of the introitus was identified. Biopsies revealed VIN-III with positive margins. She did have a Pap smear at that visit which was unremarkable. She comes in today for evaluation of the above. She states she herself never knew she had that lesion. She states since the biopsy she does have a little burning with urination but otherwise has been completely asymptomatic. She's been menopausal since the age of 60. She never took any hormone replacement therapy. She's not had any bleeding. She denies a change in her bowel or bladder habits. She does have very loose stools and he can be erratic depending on what she's eaten. For example, yesterday she had 7 bowel movements today she's not had any. She denies any blood. She states that recently 13 levels have been very good. She's gained about 5 pounds and feels very healthy at this weight. There are no cancers in the family. She has 2 children and 2 grandchildren. She is a 58 year old granddaughter and a 39-year-old grandson. She's never had an abnormal Pap smears. She is up-to-date on her mammograms.  Review of Systems  Constitutional: Denies fever. Skin: + dry skin  Cardiovascular: No chest pain, shortness of breath, or edema  Gastro Intestinal: No nausea, vomiting, constipation, + diarrhea reported. Genitourinary: Denies vaginal bleeding and discharge.   Current Meds:  Outpatient Encounter Prescriptions as of 05/17/2015  Medication Sig  . ALPRAZolam  (XANAX) 0.25 MG tablet Take 1 tablet (0.25 mg total) by mouth daily.  Marland Kitchen atorvastatin (LIPITOR) 20 MG tablet Take 1 tablet (20 mg total) by mouth daily at 6 PM. (Patient taking differently: Take 20 mg by mouth daily. )  . FLUoxetine (PROZAC) 40 MG capsule Take 1 capsule (40 mg total) by mouth daily.  . Probiotic Product (PROBIOTIC ADVANCED PO) Take by mouth.  Marland Kitchen acetaminophen (TYLENOL) 500 MG tablet Take 1,000 mg by mouth every 6 (six) hours as needed for mild pain or fever (pain and fever). Reported on 05/17/2015   No facility-administered encounter medications on file as of 05/17/2015.    Allergy:  Allergies  Allergen Reactions  . Cefotetan Other (See Comments)    Thrombocytopenia, plat ct 18K  . Naproxen     ulcers  . Nsaids     DUODENAL ULCER  . Aspirin     Ulcers   . Ibuprofen     ulcers   . Toradol [Ketorolac Tromethamine]     ulcers  . Codeine Sulfate Nausea Only    REACTION: nausea only tolerates hydrocodone  . Flagyl [Metronidazole] Other (See Comments)    Weak, bad taste     Social Hx:   Social History   Social History  . Marital Status: Married    Spouse Name: N/A  . Number of Children: 2  . Years of Education: N/A   Occupational History  . 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  Smoking status: Former Smoker -- 0.25 packs/day for 30 years    Types: Cigarettes    Quit date: 06/18/2014  . Smokeless tobacco: Never Used  . Alcohol Use: No  . Drug Use: No  . Sexual Activity:    Partners: Male    Birth Control/ Protection: None   Other Topics Concern  . Not on file   Social History Narrative    Past Surgical Hx:  Past Surgical History  Procedure Laterality Date  . Rhinoplasty      X3  . Colonscopy  AGE 54  . Vagotomy N/A 06/24/2014    Procedure: ROBOTIC TRUNCAL VAGOTOMY ;  Surgeon: Michael Boston, MD;  Location: [facility];  Service: General;  Laterality: N/A;  This is [name]'s preference  . Antrectomy N/A  06/24/2014    Procedure: ROBOTIC DISTAL GASTRECTOMY;  Surgeon: Michael Boston, MD;  Location: [facility];  Service: General;  Laterality: N/A;  . Laparoscopic roux-en-y gastric bypass with hiatal hernia repair N/A 06/24/2014    Procedure: ROBOTIC ROUX-EN-Y CONSTRUCTION WITH HIATAL HERNIA REPAIR;  Surgeon: Michael Boston, MD;  Location: [facility];  Service: General;  Laterality: N/A;  . Colonoscopy N/A 10/19/2014    Procedure: COLONOSCOPY;  Surgeon: Gatha Mayer, MD;  Location: [place];  Service: Endoscopy;  Laterality: N/A;  with FMT  . Fecal transplant N/A 10/19/2014    Procedure: FECAL TRANSPLANT;  Surgeon: Gatha Mayer, MD;  Location: [place];  Service: Endoscopy;  Laterality: N/A;    Past Medical Hx:  Past Medical History  Diagnosis Date  . Panic attacks   . High cholesterol   . Obesity   . Gastric outlet obstruction   . Pyloric ulcer   . Depression   . GERD (gastroesophageal reflux disease)   . Pyloric stenosis   . Clostridium difficile infection   . Clostridium difficile colitis 11/30/2014  . Peptic ulcer disease 12/02/2014  . Thrombocytopenia ([place]) 06/26/2014    Oncology Hx:   No history exists.    Family Hx:  Family History  Problem Relation Age of Onset  . Diabetes Mother   . Diabetes Maternal Grandmother   . Heart disease Father   . Colon cancer Neg Hx     Vitals:  Blood pressure 137/72, pulse 65, temperature 97.9 F (36.6 C), temperature source Oral, resp. rate 18, height 4\' 11"  (1.499 m), weight 140 lb 11.2 oz (63.821 kg), SpO2 99 %.  Physical Exam: Well-nourished well-developed female in no acute distress.  Groins: No lymphadenopathy.  Pelvic: Normal female genitalia with the exception of a raised hyperkeratotic erythematous lesion on the posterior fourchette. Prior biopsy site is identified. The biopsies site measures approximate 4 mm in the area of dysplastic skin extends towards the patient's right side but is limited to the introitus. There are no visible  lesions.  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 Options including wide local excision versus laser were discussed. After discussion of the pros and cons of both strategies the patient wishes to proceed with laser ablation. She was given a date for surgery here at [facility] however as her husband has upcoming surgery on the 20th she Brother have her surgery done as soon as possible and therefore scheduled for laser ablation on March 17 at [facility]. She has a preoperative visit at [facility] on March 10 at 9:30.  Risks and benefits the procedure were discussed with the patient. We discussed postoperative care including Silvadene ointment and lidocaine jelly. She has multiple antibiotic allergies but not to sulfa.  Her questions as well as those of her family were elicited in answer to her satisfaction.  We appreciate the opportunity to partner in the care of this very lovely patient.  [place] A., MD 05/17/2015, 11:07 AM

## 2015-05-19 ENCOUNTER — Encounter: Payer: Self-pay | Admitting: Internal Medicine

## 2015-05-19 ENCOUNTER — Ambulatory Visit (INDEPENDENT_AMBULATORY_CARE_PROVIDER_SITE_OTHER): Payer: BLUE CROSS/BLUE SHIELD | Admitting: Internal Medicine

## 2015-05-19 ENCOUNTER — Encounter: Payer: Self-pay | Admitting: Gynecologic Oncology

## 2015-05-19 VITALS — BP 96/68 | HR 71 | Temp 97.5°F | Ht 59.25 in | Wt 139.0 lb

## 2015-05-19 DIAGNOSIS — E785 Hyperlipidemia, unspecified: Secondary | ICD-10-CM

## 2015-05-19 DIAGNOSIS — F419 Anxiety disorder, unspecified: Secondary | ICD-10-CM

## 2015-05-19 DIAGNOSIS — Z0001 Encounter for general adult medical examination with abnormal findings: Secondary | ICD-10-CM | POA: Insufficient documentation

## 2015-05-19 DIAGNOSIS — Z0000 Encounter for general adult medical examination without abnormal findings: Secondary | ICD-10-CM | POA: Diagnosis not present

## 2015-05-19 MED ORDER — FLUOXETINE HCL 40 MG PO CAPS: 40.0000 mg | ORAL_CAPSULE | Freq: Every day | ORAL | Status: DC

## 2015-05-19 MED ORDER — ATORVASTATIN CALCIUM 20 MG PO TABS
20.0000 mg | ORAL_TABLET | Freq: Every day | ORAL | Status: DC
Start: 2015-05-19 — End: 2016-06-01

## 2015-05-19 MED ORDER — ALPRAZOLAM 0.25 MG PO TABS: 0.2500 mg | ORAL_TABLET | Freq: Every day | ORAL | Status: DC

## 2015-05-19 NOTE — Progress Notes (Signed)
Pre visit review using our clinic review tool, if applicable. No additional management support is needed unless otherwise documented below in the visit note. 

## 2015-05-19 NOTE — Assessment & Plan Note (Signed)
Finally over the C diff UTD on all cancer screening Working on healthy lifestyle Quit smoking ~1 year ago!!

## 2015-05-19 NOTE — Addendum Note (Signed)
Addended by: Ellamae Sia on: 05/19/2015 02:25 PM   Modules accepted: Orders

## 2015-05-19 NOTE — Progress Notes (Signed)
Records faxed to [facility] at [facility] for patient's upcoming procedure

## 2015-05-19 NOTE — Progress Notes (Signed)
Subjective:    Patient ID: Shelly Hood, female    DOB: Sep 07, 1955, 60 y.o.   MRN: [id number]  HPI Here for physical  GI issues are better Still has bouts of diarrhea---bad days. Nothing persistent No abdominal pain Still on probiotic Appetite is good  Now with VIN  Going to have laser next week  No problems with statin  Mood has been fair Anxious with the medical issues---probably related to the pain meds (from abscess) Uses 1 xanax daily in AM. No prn  Current Outpatient Prescriptions on File Prior to Visit  Medication Sig Dispense Refill  . acetaminophen (TYLENOL) 500 MG tablet Take 1,000 mg by mouth every 6 (six) hours as needed for mild pain or fever (pain and fever). Reported on 05/17/2015    . ALPRAZolam (XANAX) 0.25 MG tablet Take 1 tablet (0.25 mg total) by mouth daily. 90 tablet 0  . atorvastatin (LIPITOR) 20 MG tablet Take 1 tablet (20 mg total) by mouth daily at 6 PM. (Patient taking differently: Take 20 mg by mouth daily. ) 90 tablet 3  . FLUoxetine (PROZAC) 40 MG capsule Take 1 capsule (40 mg total) by mouth daily. 90 capsule 3  . Probiotic Product (PROBIOTIC ADVANCED PO) Take by mouth.     No current facility-administered medications on file prior to visit.    Allergies  Allergen Reactions  . Cefotetan Other (See Comments)    Thrombocytopenia, plat ct 18K  . Naproxen     ulcers  . Nsaids     DUODENAL ULCER  . Aspirin     Ulcers   . Ibuprofen     ulcers   . Toradol [Ketorolac Tromethamine]     ulcers  . Codeine Sulfate Nausea Only    REACTION: nausea only tolerates hydrocodone  . Flagyl [Metronidazole] Other (See Comments)    Weak, bad taste     Past Medical History  Diagnosis Date  . Panic attacks   . High cholesterol   . Obesity   . Gastric outlet obstruction   . Pyloric ulcer   . Depression   . GERD (gastroesophageal reflux disease)   . Pyloric stenosis   . Clostridium difficile infection   . Clostridium difficile colitis  11/30/2014  . Peptic ulcer disease 12/02/2014  . Thrombocytopenia ([place]) 06/26/2014    Past Surgical History  Procedure Laterality Date  . Rhinoplasty      X3  . Colonscopy  AGE 34  . Vagotomy N/A 06/24/2014    Procedure: ROBOTIC TRUNCAL VAGOTOMY ;  Surgeon: Michael Boston, MD;  [facility];  Service: General;  Laterality: N/A;  This is [name]'s preference  . Antrectomy N/A 06/24/2014    Procedure: ROBOTIC DISTAL GASTRECTOMY;  Surgeon: Michael Boston, MD;  [facility];  Service: General;  Laterality: N/A;  . Laparoscopic roux-en-y gastric bypass with hiatal hernia repair N/A 06/24/2014    Procedure: ROBOTIC ROUX-EN-Y CONSTRUCTION WITH HIATAL HERNIA REPAIR;  Surgeon: Michael Boston, MD;  [facility];  Service: General;  Laterality: N/A;  . Colonoscopy N/A 10/19/2014    Procedure: COLONOSCOPY;  Surgeon: Gatha Mayer, MD;  Location: [place];  Service: Endoscopy;  Laterality: N/A;  with FMT  . Fecal transplant N/A 10/19/2014    Procedure: FECAL TRANSPLANT;  Surgeon: Gatha Mayer, MD;  Location: [place];  Service: Endoscopy;  Laterality: N/A;    Family History  Problem Relation Age of Onset  . Diabetes Mother   . Diabetes Maternal Grandmother   . Heart  disease Father   . Colon cancer Neg Hx   . Breast cancer Paternal Grandmother     Social History   Social History  . Marital Status: Married    Spouse Name: N/A  . Number of Children: 2  . Years of Education: N/A   Occupational History  . 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 Smoking status: Former Smoker -- 0.25 packs/day for 30 years    Types: Cigarettes    Quit date: 06/18/2014  . Smokeless tobacco: Never Used  . Alcohol Use: No  . Drug Use: No  . Sexual Activity:    Partners: Male    Birth Control/ Protection: None   Other Topics Concern  . Not on file   Social History Narrative   Review of Systems  Constitutional: Negative for fatigue.       Has  gained 5# back since severe illness Wears seat belt  HENT: Negative for dental problem, hearing loss, tinnitus and trouble swallowing.        Keeps up with dentist  Eyes: Negative for visual disturbance.       No diplopia or unilateral vision loss  Respiratory: Negative for cough and shortness of breath.   Cardiovascular: Negative for chest pain, palpitations and leg swelling.  Gastrointestinal: Positive for diarrhea. Negative for nausea and abdominal pain.       Heartburn controlled with PPI  Endocrine: Negative for polydipsia and polyuria.  Genitourinary: Negative for dysuria, hematuria and dyspareunia.  Musculoskeletal: Negative for back pain, joint swelling and arthralgias.       Right shoulder better since injection  Skin: Negative for rash.       No suspicious lesions  Allergic/Immunologic: Negative for environmental allergies and immunocompromised state.  Neurological: Negative for dizziness, syncope, weakness, light-headedness and headaches.       Shaky at times--not from nerves. No persistent tremor Better if she eats   Hematological: Negative for adenopathy. Does not bruise/bleed easily.  Psychiatric/Behavioral: Negative for sleep disturbance and dysphoric mood. The patient is nervous/anxious.        Objective:   Physical Exam  Constitutional: She is oriented to person, place, and time. She appears well-developed and well-nourished.  HENT:  Head: Normocephalic and atraumatic.  Right Ear: External ear normal.  Left Ear: External ear normal.  Mouth/Throat: Oropharynx is clear and moist. No oropharyngeal exudate.  Eyes: Conjunctivae are normal. Pupils are equal, round, and reactive to light.  Neck: Normal range of motion. Neck supple. No thyromegaly present.  Cardiovascular: Normal rate, regular rhythm, normal heart sounds and intact distal pulses.  Exam reveals no gallop.   No murmur heard. Pulmonary/Chest: Effort normal and breath sounds normal. No respiratory  distress. She has no wheezes. She has no rales.  Abdominal: Soft. There is no tenderness.  Musculoskeletal: She exhibits no edema or tenderness.  Lymphadenopathy:    She has no cervical adenopathy.  Neurological: She is alert and oriented to person, place, and time.  Skin: No rash noted. No erythema.  Psychiatric: She has a normal mood and affect. Her behavior is normal.          Assessment & Plan:

## 2015-05-19 NOTE — Assessment & Plan Note (Signed)
No problems with statin Due for labs 

## 2015-05-19 NOTE — Assessment & Plan Note (Signed)
Doing well on current meds.

## 2015-05-23 ENCOUNTER — Other Ambulatory Visit (INDEPENDENT_AMBULATORY_CARE_PROVIDER_SITE_OTHER): Payer: BLUE CROSS/BLUE SHIELD

## 2015-05-23 DIAGNOSIS — F419 Anxiety disorder, unspecified: Secondary | ICD-10-CM

## 2015-05-23 DIAGNOSIS — Z0000 Encounter for general adult medical examination without abnormal findings: Secondary | ICD-10-CM

## 2015-05-23 DIAGNOSIS — E785 Hyperlipidemia, unspecified: Secondary | ICD-10-CM

## 2015-05-23 LAB — LIPID PANEL
Cholesterol: 171 mg/dL (ref 0–200)
HDL: 42.5 mg/dL (ref >39.00)
NonHDL: 128.12
Total CHOL/HDL Ratio: 4
Triglycerides: 202 mg/dL — ABNORMAL HIGH (ref 0.0–149.0)
VLDL: 40.4 mg/dL — AB (ref 0.0–40.0)

## 2015-05-23 LAB — CBC WITH DIFFERENTIAL/PLATELET
BASOS PCT: 0.3 % (ref 0.0–3.0)
Basophils Absolute: 0 10*3/uL (ref 0.0–0.1)
EOS ABS: 0.1 10*3/uL (ref 0.0–0.7)
Eosinophils Relative: 1.3 % (ref 0.0–5.0)
HCT: 39.6 % (ref 36.0–46.0)
HEMOGLOBIN: 13.4 g/dL (ref 12.0–15.0)
Lymphocytes Relative: 38 % (ref 12.0–46.0)
Lymphs Abs: 2 10*3/uL (ref 0.7–4.0)
MCHC: 34 g/dL (ref 30.0–36.0)
MCV: 90.3 fl (ref 78.0–100.0)
MONO ABS: 0.5 10*3/uL (ref 0.1–1.0)
Monocytes Relative: 9.4 % (ref 3.0–12.0)
NEUTROS ABS: 2.7 10*3/uL (ref 1.4–7.7)
Neutrophils Relative %: 51 % (ref 43.0–77.0)
PLATELETS: 253 10*3/uL (ref 150.0–400.0)
RBC: 4.38 Mil/uL (ref 3.87–5.11)
RDW: 13.3 % (ref 11.5–15.5)
WBC: 5.2 10*3/uL (ref 4.0–10.5)

## 2015-05-23 LAB — LDL CHOLESTEROL, DIRECT: LDL DIRECT: 96 mg/dL

## 2015-05-23 LAB — COMPREHENSIVE METABOLIC PANEL
ALBUMIN: 4.1 g/dL (ref 3.5–5.2)
ALT: 32 U/L (ref 0–35)
AST: 23 U/L (ref 0–37)
Alkaline Phosphatase: 89 U/L (ref 39–117)
BILIRUBIN TOTAL: 0.4 mg/dL (ref 0.2–1.2)
BUN: 8 mg/dL (ref 6–23)
CHLORIDE: 106 meq/L (ref 96–112)
CO2: 29 mEq/L (ref 19–32)
CREATININE: 0.73 mg/dL (ref 0.40–1.20)
Calcium: 9.4 mg/dL (ref 8.4–10.5)
GFR: 87.23 mL/min (ref >60.00)
Glucose, Bld: 87 mg/dL (ref 70–99)
Potassium: 4.4 mEq/L (ref 3.5–5.1)
SODIUM: 143 meq/L (ref 135–145)
Total Protein: 6.9 g/dL (ref 6.0–8.3)

## 2015-05-26 HISTORY — PX: LASER ABLATION: SHX1947

## 2015-05-30 ENCOUNTER — Ambulatory Visit: Payer: BLUE CROSS/BLUE SHIELD | Admitting: Obstetrics & Gynecology

## 2015-06-07 ENCOUNTER — Ambulatory Visit: Payer: BLUE CROSS/BLUE SHIELD | Admitting: Family Medicine

## 2015-06-21 ENCOUNTER — Encounter: Payer: Self-pay | Admitting: Gynecologic Oncology

## 2015-06-21 ENCOUNTER — Ambulatory Visit: Payer: BLUE CROSS/BLUE SHIELD | Attending: Gynecologic Oncology | Admitting: Gynecologic Oncology

## 2015-06-21 VITALS — BP 105/47 | HR 64 | Temp 98.0°F | Resp 18 | Ht 59.0 in | Wt 141.6 lb

## 2015-06-21 DIAGNOSIS — F329 Major depressive disorder, single episode, unspecified: Secondary | ICD-10-CM | POA: Insufficient documentation

## 2015-06-21 DIAGNOSIS — Z87891 Personal history of nicotine dependence: Secondary | ICD-10-CM | POA: Diagnosis not present

## 2015-06-21 DIAGNOSIS — D071 Carcinoma in situ of vulva: Secondary | ICD-10-CM | POA: Diagnosis not present

## 2015-06-21 DIAGNOSIS — Z79899 Other long term (current) drug therapy: Secondary | ICD-10-CM | POA: Diagnosis not present

## 2015-06-21 DIAGNOSIS — E669 Obesity, unspecified: Secondary | ICD-10-CM | POA: Insufficient documentation

## 2015-06-21 DIAGNOSIS — Z803 Family history of malignant neoplasm of breast: Secondary | ICD-10-CM | POA: Insufficient documentation

## 2015-06-21 DIAGNOSIS — E7800 Pure hypercholesterolemia, unspecified: Secondary | ICD-10-CM | POA: Insufficient documentation

## 2015-06-21 DIAGNOSIS — K219 Gastro-esophageal reflux disease without esophagitis: Secondary | ICD-10-CM | POA: Diagnosis not present

## 2015-06-21 NOTE — Patient Instructions (Signed)
Follow-up with Dr. Byron Peacock in 4 months.  

## 2015-06-21 NOTE — Progress Notes (Signed)
Consult Note: Gyn-Onc  Shelly Hood 60 y.o. female  CC:  Chief Complaint  Patient presents with  . VIN III    MD follow up visit    HPI: Patient was seen today in consultation at the request of Dr. Hulan Fray.  Patient is a very pleasant 60 year old gravida 2 para 2 who has a very complicated past medical history but is doing very well. She was seen by Dr. Hulan Fray for her routine GYN care on 05/05/2015. At that time a 3 mm lobulated area of white hyperkeratosis at the level of the introitus was identified. Biopsies revealed VIN-III with positive margins. She did have a Pap smear at that visit which was unremarkable. She comes in today for evaluation of the above. She states she herself never knew she had that lesion. She states since the biopsy she does have a little burning with urination but otherwise has been completely asymptomatic. She's been menopausal since the age of 60. She never took any hormone replacement therapy. She's not had any bleeding. She denies a change in her bowel or bladder habits. She does have very loose stools and he can be erratic depending on what she's eaten. For example, yesterday she had 7 bowel movements today she's not had any. She denies any blood. She states that recently 13 levels have been very good. She's gained about 5 pounds and feels very healthy at this weight. There are no cancers in the family. She has 2 children and 2 grandchildren. She is a 58 year old granddaughter and a 48-year-old grandson. She's never had an abnormal Pap smears. She is up-to-date on her mammograms.  Due to scheduling she underwent a laser ablation of the vulva at [facility] on March 17. Her procedure was uncomplicated. She comes in today for a postoperative check.  Current Meds:  Outpatient Encounter Prescriptions as of 06/21/2015  Medication Sig  . acetaminophen (TYLENOL) 500 MG tablet Take 1,000 mg by mouth every 6 (six) hours as needed for mild pain or fever (pain and fever). Reported on  05/17/2015  . ALPRAZolam (XANAX) 0.25 MG tablet Take 1 tablet (0.25 mg total) by mouth daily.  Marland Kitchen atorvastatin (LIPITOR) 20 MG tablet Take 1 tablet (20 mg total) by mouth daily.  Marland Kitchen FLUoxetine (PROZAC) 40 MG capsule Take 1 capsule (40 mg total) by mouth daily.  Marland Kitchen lidocaine (XYLOCAINE) 2 % jelly   . Probiotic Product (PROBIOTIC ADVANCED PO) Take by mouth.  . SSD 1 % cream    No facility-administered encounter medications on file as of 06/21/2015.    Allergy:  Allergies  Allergen Reactions  . Cefotetan Other (See Comments)    Thrombocytopenia, plat ct 18K  . Naproxen     ulcers  . Nsaids     DUODENAL ULCER  . Aspirin     Ulcers   . Ibuprofen     ulcers   . Toradol [Ketorolac Tromethamine]     ulcers  . Codeine Sulfate Nausea Only    REACTION: nausea only tolerates hydrocodone  . Flagyl [Metronidazole] Other (See Comments)    Weak, bad taste     Social Hx:   Social History   Social History  . Marital Status: Married    Spouse Name: N/A  . Number of Children: 2  . Years of Education: N/A   Occupational History  . 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 Smoking status: Former Smoker -- 0.25 packs/day for 30 years  Types: Cigarettes    Quit date: 06/18/2014  . Smokeless tobacco: Never Used  . Alcohol Use: No  . Drug Use: No  . Sexual Activity:    Partners: Male    Birth Control/ Protection: None   Other Topics Concern  . Not on file   Social History Narrative    Past Surgical Hx:  Past Surgical History  Procedure Laterality Date  . Rhinoplasty      X3  . Colonscopy  AGE 67  . Vagotomy N/A 06/24/2014    Procedure: ROBOTIC TRUNCAL VAGOTOMY ;  Surgeon: Michael Boston, MD;  [facility];  Service: General;  Laterality: N/A;  This is [name]'s preference  . Antrectomy N/A 06/24/2014    Procedure: ROBOTIC DISTAL GASTRECTOMY;  Surgeon: Michael Boston, MD;  [facility];  Service: General;  Laterality: N/A;  .  Laparoscopic roux-en-y gastric bypass with hiatal hernia repair N/A 06/24/2014    Procedure: ROBOTIC ROUX-EN-Y CONSTRUCTION WITH HIATAL HERNIA REPAIR;  Surgeon: Michael Boston, MD;  [facility];  Service: General;  Laterality: N/A;  . Colonoscopy N/A 10/19/2014    Procedure: COLONOSCOPY;  Surgeon: Gatha Mayer, MD;  Location: [place];  Service: Endoscopy;  Laterality: N/A;  with FMT  . Fecal transplant N/A 10/19/2014    Procedure: FECAL TRANSPLANT;  Surgeon: Gatha Mayer, MD;  Location: [place];  Service: Endoscopy;  Laterality: N/A;    Past Medical Hx:  Past Medical History  Diagnosis Date  . Panic attacks   . High cholesterol   . Obesity   . Gastric outlet obstruction   . Pyloric ulcer   . Depression   . GERD (gastroesophageal reflux disease)   . Pyloric stenosis   . Clostridium difficile infection   . Clostridium difficile colitis 11/30/2014  . Peptic ulcer disease 12/02/2014  . Thrombocytopenia ([place]) 06/26/2014    Oncology Hx:   No history exists.    Family Hx:  Family History  Problem Relation Age of Onset  . Diabetes Mother   . Diabetes Maternal Grandmother   . Heart disease Father   . Colon cancer Neg Hx   . Breast cancer Paternal Grandmother     Vitals:  Blood pressure 105/47, pulse 64, temperature 98 F (36.7 C), temperature source Oral, resp. rate 18, height 4\' 11"  (1.499 m), weight 141 lb 9.6 oz (64.229 kg), SpO2 99 %.  Physical Exam: Well-nourished well-developed female in no acute distress.  Pelvic: Normal female genitalia Which is healing well status post laser ablation. There is a slight raised area right at the middle of the fourchette consistent with scarring. 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 She will return to see me in 4 months for follow-up visit. At that time if there is no evidence of dysplasia will return to the care of Dr. Hulan Fray.  She was instructed to do vulvar checks once a month and to notify us if there's any changes. She  is comfortable doing that. She was shown her vulva today with the mirror.  We appreciate the opportunity to partner in the care of this very lovely patient.  Aramis Zobel A., MD 06/21/2015, 9:17 AM

## 2015-08-14 ENCOUNTER — Ambulatory Visit (INDEPENDENT_AMBULATORY_CARE_PROVIDER_SITE_OTHER): Payer: BLUE CROSS/BLUE SHIELD | Admitting: Obstetrics & Gynecology

## 2015-08-14 ENCOUNTER — Encounter: Payer: Self-pay | Admitting: Obstetrics & Gynecology

## 2015-08-14 VITALS — BP 126/73 | HR 73 | Ht 59.0 in | Wt 139.0 lb

## 2015-08-14 DIAGNOSIS — N9089 Other specified noninflammatory disorders of vulva and perineum: Secondary | ICD-10-CM | POA: Diagnosis not present

## 2015-08-14 DIAGNOSIS — D071 Carcinoma in situ of vulva: Secondary | ICD-10-CM | POA: Diagnosis not present

## 2015-08-14 LAB — HIV ANTIBODY (ROUTINE TESTING W REFLEX): HIV: NONREACTIVE

## 2015-08-14 MED ORDER — GABAPENTIN 300 MG PO CAPS: 300.0000 mg | ORAL_CAPSULE | Freq: Three times a day (TID) | ORAL | Status: DC

## 2015-08-14 MED ORDER — VALACYCLOVIR HCL 1 G PO TABS: 1000.0000 mg | ORAL_TABLET | Freq: Two times a day (BID) | ORAL | Status: DC

## 2015-08-14 NOTE — Progress Notes (Signed)
CLINIC ENCOUNTER NOTE  History:  60 y.o. 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 Alycia Rossetti at [facility] on 05/26/15.  She reports development of painful lesions on her left labium that have progressively increased in size and gotten more painful.  She has noticed scabs over these lesions.  Uses lidocaine jelly which helped with the pain. She denies any abnormal vaginal discharge, bleeding, other concerns.   Past Medical History  Diagnosis Date  . Panic attacks   . High cholesterol   . Obesity   . Gastric outlet obstruction   . Pyloric ulcer   . Depression   . GERD (gastroesophageal reflux disease)   . Pyloric stenosis   . Clostridium difficile infection   . Clostridium difficile colitis 11/30/2014  . Peptic ulcer disease 12/02/2014  . Thrombocytopenia ([place]) 06/26/2014    Past Surgical History  Procedure Laterality Date  . Rhinoplasty      X3  . Colonoscopy  AGE 61  . Vagotomy N/A 06/24/2014    Procedure: ROBOTIC TRUNCAL VAGOTOMY ;  Surgeon: Michael Boston, MD;  [facility];  Service: General;  Laterality: N/A;  This is [name]'s preference  . Antrectomy N/A 06/24/2014    Procedure: ROBOTIC DISTAL GASTRECTOMY;  Surgeon: Michael Boston, MD;  [facility]: [facility];  Service: General;  Laterality: N/A;  . Laparoscopic roux-en-y gastric bypass with hiatal hernia repair N/A 06/24/2014    Procedure: ROBOTIC ROUX-EN-Y CONSTRUCTION WITH HIATAL HERNIA REPAIR;  Surgeon: Michael Boston, MD;  [facility];  Service: General;  Laterality: N/A;  . Colonoscopy N/A 10/19/2014    Procedure: COLONOSCOPY;  Surgeon: Gatha Mayer, MD;  Location: [place];  Service: Endoscopy;  Laterality: N/A;  with FMT  . Fecal transplant N/A 10/19/2014    Procedure: FECAL TRANSPLANT;  Surgeon: Gatha Mayer, MD;  Location: [place];  Service: Endoscopy;  Laterality: N/A;  . Laser ablation  05/26/2015    CO2 laser ablation of the vulva  for VIN III    The following portions of the patient's history were reviewed and updated as appropriate: allergies, current medications, past family history, past medical history, past social history, past surgical history and problem list.   Health Maintenance:  Normal pap and negative HRHPV on 05/05/15.    Review of Systems:  Pertinent items noted in HPI and remainder of comprehensive ROS otherwise negative.  Objective:  Physical Exam BP 126/73 mmHg  Pulse 73  Ht 4\' 11"  (1.499 m)  Wt 139 lb (63.05 kg)  BMI 28.06 kg/m2 CONSTITUTIONAL: Well-developed, well-nourished female in no acute distress.  HENT:  Normocephalic, atraumatic. External right and left ear normal. Oropharynx is clear and moist EYES: Conjunctivae and EOM are normal. Pupils are equal, round, and reactive to light. No scleral icterus.  NECK: Normal range of motion, supple, no masses SKIN: Skin is warm and dry. No rash noted. Not diaphoretic. No erythema. No pallor. NEUROLOGIC: Alert and oriented to person, place, and time. Normal reflexes, muscle tone coordination. No cranial nerve deficit noted. PSYCHIATRIC: Normal mood and affect. Normal behavior. Normal judgment and thought content. CARDIOVASCULAR: Normal heart rate noted RESPIRATORY: Effort and breath sounds normal, no problems with respiration noted ABDOMEN: Soft, no distention noted.   MUSCULOSKELETAL: Normal range of motion. No edema noted. PELVIC: Left labium majus with lesions noted looking like deflated blisters, with surrounding erythema and small amount of serous drainage.  Has  significant underlying induration.  Tender to touch.  Superior lesion was unroofed, and specimen was sent for culture.   Assessment & Plan:  Vulvar lesion with h/o VIN III Likely of viral etiology; suspect HSV or vulva shingles.  Less likely related to VIN III, but this will need to be evaluated if lesion does not improve.  Will treat with Valtrex, reevaluate in one week. Cultures and  labs obtained. - valACYclovir (VALTREX) 1000 MG tablet; Take 1 tablet (1,000 mg total) by mouth 2 (two) times daily. Take for ten days.  Dispense: 20 tablet; Refill: 3 - gabapentin (NEURONTIN) 300 MG capsule; Take 1 capsule (300 mg total) by mouth 3 (three) times daily.  Dispense: 30 capsule; Refill: 3 - HIV antibody - RPR - HSV(herpes smplx)abs-1+2(IgG+IgM)-bld - Herpes simplex virus culture - Viral culture  Return in about 1 week (around 08/21/2015), or if symptoms worsen or fail to improve, for Follow up .  Total face-to-face time with patient: 15 minutes. Over 50% of encounter was spent on counseling and coordination of care.   Verita Schneiders, MD, [place] Attending Obstetrician & Gynecologist, [place] for [facility]

## 2015-08-14 NOTE — Patient Instructions (Signed)
Return to clinic for any scheduled appointments or for any gynecologic concerns as needed.   

## 2015-08-15 LAB — RPR

## 2015-08-16 LAB — HERPES SIMPLEX VIRUS CULTURE: ORGANISM ID, BACTERIA: NOT DETECTED

## 2015-08-17 ENCOUNTER — Ambulatory Visit: Payer: BLUE CROSS/BLUE SHIELD | Admitting: Obstetrics & Gynecology

## 2015-08-17 LAB — HSV(HERPES SMPLX)ABS-I+II(IGG+IGM)-BLD
HERPES SIMPLEX VRS I-IGM AB (EIA): 0.56 {index}
HSV 1 Glycoprotein G Ab, IgG: 21.3 Index — ABNORMAL HIGH (ref <0.90)

## 2015-08-18 ENCOUNTER — Ambulatory Visit (INDEPENDENT_AMBULATORY_CARE_PROVIDER_SITE_OTHER): Payer: BLUE CROSS/BLUE SHIELD | Admitting: Obstetrics and Gynecology

## 2015-08-18 ENCOUNTER — Encounter: Payer: Self-pay | Admitting: Obstetrics and Gynecology

## 2015-08-18 VITALS — BP 122/71 | HR 82 | Resp 16

## 2015-08-18 DIAGNOSIS — R102 Pelvic and perineal pain: Secondary | ICD-10-CM

## 2015-08-18 DIAGNOSIS — D071 Carcinoma in situ of vulva: Secondary | ICD-10-CM | POA: Diagnosis not present

## 2015-08-18 MED ORDER — LIDOCAINE HCL 2 % EX GEL: Freq: Two times a day (BID) | CUTANEOUS | Status: DC | PRN

## 2015-08-18 MED ORDER — BACITRACIN 500 UNIT/GM EX OINT: 1.0000 "application " | TOPICAL_OINTMENT | Freq: Two times a day (BID) | CUTANEOUS | Status: DC

## 2015-08-18 NOTE — Patient Instructions (Signed)
Do Sitz Baths three times a day; you can buy a bowl that sits in the toilet at the drug store  Apply the bacitracin, which you can buy over the counter from the drug store, to the area before putting on a pad to keep it from sticking to the pad/panty liner  Call [facility] for an appointment for evaluation; later this month is fine if they can't see you till then  How to Take a Sitz Bath A sitz bath is a warm water bath that is taken while you are sitting down. The water should only come up to your hips and should cover your buttocks. Your health care provider may recommend a sitz bath to help you:   Clean the lower part of your body, including your genital area.  With itching.  With pain.  With sore muscles or muscles that tighten or spasm. HOW TO TAKE A SITZ BATH Take 3-4 sitz baths per day or as told by your health care provider. 1. Partially fill a bathtub with warm water. You will only need the water to be deep enough to cover your hips and buttocks when you are sitting in it. 2. If your health care provider told you to put medicine in the water, follow the directions exactly. 3. Sit in the water and open the tub drain a little. 4. Turn on the warm water again to keep the tub at the correct level. Keep the water running constantly. 5. Soak in the water for 15-20 minutes or as told by your health care provider. 6. After the sitz bath, pat the affected area dry first. Do not rub it. 7. Be careful when you stand up after the sitz bath because you may feel dizzy. SEEK MEDICAL CARE IF:  Your symptoms get worse. Do not continue with sitz baths if your symptoms get worse.  You have new symptoms. Do not continue with sitz baths until you talk with your health care provider.   This information is not intended to replace advice given to you by your health care provider. Make sure you discuss any questions you have with your health care provider.   Document Released: 11/18/2003 Document Revised:  07/12/2014 Document Reviewed: 02/23/2014 Elsevier Interactive Patient Education [employer].

## 2015-08-18 NOTE — Progress Notes (Signed)
Obstetrics and Gynecology Visit Return Patient Evaluation  Appointment Date: 08/18/2015  Primary Care Provider: Viviana Hood  Referring Provider: self   Chief Complaint: continued left vulvar pain  History of Present Illness: Shelly Hood is a 60 y.o. seen for the above CC.PMHx seen for VIN 3 s/p [facility] CO2 laser ablation by Dr. Alycia Rossetti in 05/2015.  She was seen for post op visit by her one month later and was doing well and put on a 6 month follow up schedule. She came on 6/5 for evaluation and saw Dr. Harolyn Rutherford  For right vulvar pain. She put her on valtrex and neurontin and the patient was to continue the lidogel that she had left over from surgery. From the ablation note and per the patient, the ablation was done at the posterior fourchette and not in the area involved. RPR, HIV, HSV culture negative.  She states the s/s are the same and persisting and came in because she states that it looks different.  She is taking the valtrex and continuing the use the lidogel but not the neurontin b/c the lidogel was felt to be working okay.   No fevers, chills, nausea, vomiting, abdominal pain, dysuria  Review of Systems:   Her 12 point review of systems is negative or as noted in the History of Present Illness.  Patient Active Problem List   Diagnosis Date Noted  . Vulvar pain 08/18/2015  . Preventative health care 05/19/2015  . VIN III (vulvar intraepithelial neoplasia III) 05/11/2015  . S/P distal gastrectomy/vagotomy with Roux-en-Y reconstruction - SHE HAS NOT HAD A GASTRIC BYPASS 12/08/2014  . Peptic ulcer disease 12/02/2014  . Clostridium difficile colitis 11/30/2014  . 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 OSTEOPENIA 10/14/2006  . Hyperlipemia 07/30/2006  . Anxiety disorder 07/30/2006  . ALLERGIC RHINITIS 07/30/2006    The following portions of the patient's history were reviewed and updated as appropriate: allergies, current medications, past family  history, past medical history, past social history, past surgical history and problem list.  Past Medical History:  Past Medical History  Diagnosis Date  . Panic attacks   . High cholesterol   . Obesity   . Gastric outlet obstruction   . Pyloric ulcer   . Depression   . GERD (gastroesophageal reflux disease)   . Pyloric stenosis   . Clostridium difficile infection   . Clostridium difficile colitis 11/30/2014  . Peptic ulcer disease 12/02/2014  . Thrombocytopenia ([place]) 06/26/2014    Past Surgical History:  Past Surgical History  Procedure Laterality Date  . Rhinoplasty      X3  . Colonoscopy  AGE 27  . Vagotomy N/A 06/24/2014    Procedure: ROBOTIC TRUNCAL VAGOTOMY ;  Surgeon: Michael Boston, MD;  [facility];  Service: General;  Laterality: N/A;  This is [name]'s preference  . Antrectomy N/A 06/24/2014    Procedure: ROBOTIC DISTAL GASTRECTOMY;  Surgeon: Michael Boston, MD;  [facility];  Service: General;  Laterality: N/A;  . Laparoscopic roux-en-y gastric bypass with hiatal hernia repair N/A 06/24/2014    Procedure: ROBOTIC ROUX-EN-Y CONSTRUCTION WITH HIATAL HERNIA REPAIR;  Surgeon: Michael Boston, MD;  [facility];  Service: General;  Laterality: N/A;  . Colonoscopy N/A 10/19/2014    Procedure: COLONOSCOPY;  Surgeon: Gatha Mayer, MD;  Location: [place];  Service: Endoscopy;  Laterality: N/A;  with FMT  . Fecal transplant N/A 10/19/2014    Procedure: FECAL TRANSPLANT;  Surgeon: Gatha Mayer, MD;  Location: [facility]  ENDOSCOPY;  Service: Endoscopy;  Laterality: N/A;  . Laser ablation  05/26/2015    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    Medications [name] had no medications administered during this visit. Current Outpatient Prescriptions  Medication Sig Dispense Refill  . lidocaine (XYLOCAINE) 2 % jelly Apply topically 2 (two) times daily as needed. 30 mL 1  . valACYclovir (VALTREX) 1000 MG tablet Take 1 tablet (1,000 mg total) by mouth 2 (two) times daily.  Take for ten days. 20 tablet 3  . acetaminophen (TYLENOL) 500 MG tablet Take 1,000 mg by mouth every 6 (six) hours as needed for mild pain or fever (pain and fever). Reported on 05/17/2015    . ALPRAZolam (XANAX) 0.25 MG tablet Take 1 tablet (0.25 mg total) by mouth daily. 90 tablet 0  . atorvastatin (LIPITOR) 20 MG tablet Take 1 tablet (20 mg total) by mouth daily. 90 tablet 3  . bacitracin 500 UNIT/GM ointment Apply 1 application topically 2 (two) times daily. 15 g 0  . FLUoxetine (PROZAC) 40 MG capsule Take 1 capsule (40 mg total) by mouth daily. 90 capsule 3  . gabapentin (NEURONTIN) 300 MG capsule Take 1 capsule (300 mg total) by mouth 3 (three) times daily. (Patient not taking: Reported on 08/18/2015) 30 capsule 3  . oxyCODONE (ROXICODONE) 5 MG immediate release tablet Take 5 mg by mouth. Reported on 08/18/2015    . Probiotic Product (PROBIOTIC ADVANCED PO) Take by mouth. Reported on 08/18/2015    . SSD 1 % cream Reported on 08/18/2015     No current facility-administered medications for this visit.    Allergies Cefotetan; Naproxen; Nsaids; Aspirin; Ibuprofen; Toradol; Codeine sulfate; and Flagyl   Physical Exam:  BP 122/71 mmHg  Pulse 82  Resp 16 There is no weight on file to calculate BMI. General appearance: Well nourished, well developed female in no acute distress.  Abdomen: nttp, nd.  Neuro/Psych:  Normal mood and affect.  Skin:  Warm and dry.  Lymphatic:  No inguinal lymphadenopathy.   Pelvic exam: is not limited by body habitus EGBUS:  Left vulva normal except two lesions at the 3 (1x2cm) and 2 (1x1cm) o'clock position. They are about 75mm from each other with the 2 o'clock lesion closer to the clitoris. They are minimally TTP and have a white pustule like scab over it and minimally erythematous circumferentially around these areas. This area was numbed with lidocaine spray and then swabbed with betadine several times. Using a sterile cotton tipped applicator, I unroofed these areas,  about halfway, and no drainage apparent and the bases didn't seem fulminately infected and just slightly erythematous. These areas were left unroofed and cleaned with more betadine. Just lateral to the 3 o'clock area is a symmetric, 60mm, black/brown spot, non irregular border that appears like a benign nevus right vulva and posterior fourchette normal. Moderate atrophy  Laboratory: as above  Assessment: vulvar pustule, pt stable  Plan: recommend sitz baths tid, finish out valtrex course, continue with lidogel and can do use the neurontin PRN. Pt told to also put bacitracin over area and use panty liner to keep it from sticking to liner. [facility] called and will try and put her in the next wk or two; pt to also call them to follow up. Pt told that needs to be evaluated by them, ultimately, to possibly be biopsied.   RTC PRN  Durene Romans MD Attending Center for [employer] farm manager)

## 2015-08-21 ENCOUNTER — Ambulatory Visit: Payer: BLUE CROSS/BLUE SHIELD | Admitting: Obstetrics & Gynecology

## 2015-08-22 LAB — OTHER SOLSTAS TEST: COST - REFERRED ASSAY: 17495

## 2015-09-13 ENCOUNTER — Other Ambulatory Visit: Payer: Self-pay

## 2015-09-13 MED ORDER — ALPRAZOLAM 0.25 MG PO TABS: 0.2500 mg | ORAL_TABLET | Freq: Every day | ORAL | Status: DC

## 2015-09-13 NOTE — Telephone Encounter (Signed)
Approved: #[id number] x 0 Form signed 

## 2015-09-13 NOTE — Telephone Encounter (Signed)
Form faxed back to [facility] 

## 2015-09-13 NOTE — Telephone Encounter (Signed)
Received fax form from [facility] for refill of Alprazolam. Form in Dr Alla German InBox on his desk. Last written 05-19-15 Last OV 05-19-15 No Future OV

## 2015-11-01 ENCOUNTER — Ambulatory Visit: Payer: BLUE CROSS/BLUE SHIELD | Attending: Gynecologic Oncology | Admitting: Gynecologic Oncology

## 2015-11-01 ENCOUNTER — Encounter: Payer: Self-pay | Admitting: Gynecologic Oncology

## 2015-11-01 VITALS — BP 109/50 | HR 64 | Temp 98.3°F | Resp 18 | Wt 141.0 lb

## 2015-11-01 DIAGNOSIS — Z8711 Personal history of peptic ulcer disease: Secondary | ICD-10-CM | POA: Insufficient documentation

## 2015-11-01 DIAGNOSIS — Z885 Allergy status to narcotic agent status: Secondary | ICD-10-CM | POA: Diagnosis not present

## 2015-11-01 DIAGNOSIS — Z903 Acquired absence of stomach [part of]: Secondary | ICD-10-CM | POA: Insufficient documentation

## 2015-11-01 DIAGNOSIS — Z833 Family history of diabetes mellitus: Secondary | ICD-10-CM | POA: Diagnosis not present

## 2015-11-01 DIAGNOSIS — D696 Thrombocytopenia, unspecified: Secondary | ICD-10-CM | POA: Insufficient documentation

## 2015-11-01 DIAGNOSIS — D071 Carcinoma in situ of vulva: Secondary | ICD-10-CM | POA: Diagnosis not present

## 2015-11-01 DIAGNOSIS — F329 Major depressive disorder, single episode, unspecified: Secondary | ICD-10-CM | POA: Insufficient documentation

## 2015-11-01 DIAGNOSIS — Z8619 Personal history of other infectious and parasitic diseases: Secondary | ICD-10-CM | POA: Diagnosis not present

## 2015-11-01 DIAGNOSIS — K219 Gastro-esophageal reflux disease without esophagitis: Secondary | ICD-10-CM | POA: Diagnosis not present

## 2015-11-01 DIAGNOSIS — Z881 Allergy status to other antibiotic agents status: Secondary | ICD-10-CM | POA: Diagnosis not present

## 2015-11-01 DIAGNOSIS — Z8249 Family history of ischemic heart disease and other diseases of the circulatory system: Secondary | ICD-10-CM | POA: Insufficient documentation

## 2015-11-01 DIAGNOSIS — Z886 Allergy status to analgesic agent status: Secondary | ICD-10-CM | POA: Diagnosis not present

## 2015-11-01 DIAGNOSIS — Z87891 Personal history of nicotine dependence: Secondary | ICD-10-CM | POA: Insufficient documentation

## 2015-11-01 DIAGNOSIS — Z800 Family history of malignant neoplasm of digestive organs: Secondary | ICD-10-CM | POA: Diagnosis not present

## 2015-11-01 DIAGNOSIS — Z888 Allergy status to other drugs, medicaments and biological substances status: Secondary | ICD-10-CM | POA: Insufficient documentation

## 2015-11-01 DIAGNOSIS — Z803 Family history of malignant neoplasm of breast: Secondary | ICD-10-CM | POA: Insufficient documentation

## 2015-11-01 NOTE — Progress Notes (Signed)
Consult Note: Gyn-Onc  Shelly Hood 60 y.o. female  CC:  Chief Complaint  Patient presents with  . Vulvar intraepithelial neoplasia    Follow up    HPI: Patient was seen today in consultation at the request of Dr. Hulan Hood.  Patient is a very pleasant 60 year old gravida 2 para 2 who has a very complicated past medical history but is doing very well. She was seen by Dr. Hulan Hood for her routine GYN care on 05/05/2015. At that time a 3 mm lobulated area of white hyperkeratosis at the level of the introitus was identified. Biopsies revealed VIN-III with positive margins. She did have a Pap smear at that visit which was unremarkable. She comes in today for evaluation of the above. She states she herself never knew she had that lesion. She states since the biopsy she does have a little burning with urination but otherwise has been completely asymptomatic. She's been menopausal since the age of 78. She never took any hormone replacement therapy. She's not had any bleeding. She denies a change in her bowel or bladder habits. She does have very loose stools and he can be erratic depending on what she's eaten. For example, yesterday she had 7 bowel movements today she's not had any. She denies any blood. She states that recently 13 levels have been very good. She's gained about 5 pounds and feels very healthy at this weight. There are no cancers in the family. She has 2 children and 2 grandchildren. She is a 64 year old granddaughter and a 18-year-old grandson. She's never had an abnormal Pap smears. She is up-to-date on her mammograms.  Due to scheduling she underwent a laser ablation of the vulva at [facility] on March 17. Her procedure was uncomplicated. I saw her for a postoperative check in April. In June she had an upper set of acute left vulvar pain. She brought me pictures of the lesion today. She had a markedly edematous and erythematous left vulva with a small necrotic 2-3 mm punctate lesion with 4 satellite  lesions that were purulent in nature. She was seen by gynecology and had negative RPR, HIV, an HSV cultures. Follow-up continued to revealed a small ulcers that were removed. No other cultures were obtained. She states that she loose her lidocaine jelly and ultimately symptoms 1 away. Since that time with the resolution of the symptoms she's had no further episodes or issues. She denies any changes in any topical agents or detergents. She denied any knowledge of being bitten by any insects or tick bite.  She currently has no itching or other lesions appreciated.  Current Meds:  Outpatient Encounter Prescriptions as of 11/01/2015  Medication Sig  . acetaminophen (TYLENOL) 500 MG tablet Take 1,000 mg by mouth every 6 (six) hours as needed for mild pain or fever (pain and fever). Reported on 05/17/2015  . ALPRAZolam (XANAX) 0.25 MG tablet Take 1 tablet (0.25 mg total) by mouth daily.  Marland Kitchen atorvastatin (LIPITOR) 20 MG tablet Take 1 tablet (20 mg total) by mouth daily.  . bacitracin 500 UNIT/GM ointment Apply 1 application topically 2 (two) times daily.  Marland Kitchen FLUoxetine (PROZAC) 40 MG capsule Take 1 capsule (40 mg total) by mouth daily.  Marland Kitchen gabapentin (NEURONTIN) 300 MG capsule Take 1 capsule (300 mg total) by mouth 3 (three) times daily.  Marland Kitchen lidocaine (XYLOCAINE) 2 % jelly Apply topically 2 (two) times daily as needed.  Marland Kitchen oxyCODONE (ROXICODONE) 5 MG immediate release tablet Take 5 mg by mouth. Reported on 08/18/2015  .  Probiotic Product (PROBIOTIC ADVANCED PO) Take by mouth. Reported on 08/18/2015  . SSD 1 % cream Reported on 08/18/2015  . valACYclovir (VALTREX) 1000 MG tablet Take 1 tablet (1,000 mg total) by mouth 2 (two) times daily. Take for ten days.   No facility-administered encounter medications on file as of 11/01/2015.     Allergy:  Allergies  Allergen Reactions  . Cefotetan Other (See Comments)    Thrombocytopenia, plat ct 18K  . Naproxen     ulcers  . Nsaids     DUODENAL ULCER  . Aspirin      Ulcers   . Ibuprofen     ulcers   . Toradol [Ketorolac Tromethamine]     ulcers  . Codeine Sulfate Nausea Only    REACTION: nausea only tolerates hydrocodone  . Flagyl [Metronidazole] Other (See Comments)    Weak, bad taste     Social Hx:   Social History   Social History  . Marital status: Married    Spouse name: N/A  . Number of children: 2  . Years of education: N/A   Occupational History  . 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 Smoking status: Former Smoker    Packs/day: 0.25    Years: 30.00    Types: Cigarettes    Quit date: 06/18/2014  . Smokeless tobacco: Never Used  . Alcohol use No  . Drug use: No  . Sexual activity: No   Other Topics Concern  . Not on file   Social History Narrative  . No narrative on file    Past Surgical Hx:  Past Surgical History:  Procedure Laterality Date  . ANTRECTOMY N/A 06/24/2014   Procedure: ROBOTIC DISTAL GASTRECTOMY;  Surgeon: Michael Boston, MD;  [facility];  Service: General;  Laterality: N/A;  . COLONOSCOPY  AGE 80  . COLONOSCOPY N/A 10/19/2014   Procedure: COLONOSCOPY;  Surgeon: Gatha Mayer, MD;  Location: [place];  Service: Endoscopy;  Laterality: N/A;  with FMT  . FECAL TRANSPLANT N/A 10/19/2014   Procedure: FECAL TRANSPLANT;  Surgeon: Gatha Mayer, MD;  Location: [place];  Service: Endoscopy;  Laterality: N/A;  . LAPAROSCOPIC ROUX-EN-Y GASTRIC BYPASS WITH HIATAL HERNIA REPAIR N/A 06/24/2014   Procedure: ROBOTIC ROUX-EN-Y CONSTRUCTION WITH HIATAL HERNIA REPAIR;  Surgeon: Michael Boston, MD;  [facility];  Service: General;  Laterality: N/A;  . 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 RHINOPLASTY     X3  . VAGOTOMY N/A 06/24/2014   Procedure: ROBOTIC TRUNCAL VAGOTOMY ;  Surgeon: Michael Boston, MD;  [facility];  Service: General;  Laterality: N/A;  This is [name]'s preference    Past Medical Hx:  Past Medical  History:  Diagnosis Date  . Clostridium difficile colitis 11/30/2014  . Clostridium difficile infection   . Depression   . Gastric outlet obstruction   . GERD (gastroesophageal reflux disease)   . High cholesterol   . Obesity   . Panic attacks   . Peptic ulcer disease 12/02/2014  . Pyloric stenosis   . Pyloric ulcer   . Thrombocytopenia ([place]) 06/26/2014    Oncology Hx:   No history exists.    Family Hx:  Family History  Problem Relation Age of Onset  . Diabetes Mother   . Diabetes Maternal Grandmother   . Heart disease Father   . Breast cancer Paternal Grandmother   .  Colon cancer Neg Hx     Vitals:  Blood pressure (!) 109/50, pulse 64, temperature 98.3 F (36.8 C), temperature source Oral, resp. rate 18, weight 141 lb (64 kg), SpO2 98 %.  Physical Exam: Well-nourished well-developed female in no acute distress.  Pelvic: Normal female genitalia. She has no scarring or residual sequela from the issue on the left vulva. There is no area of any hyperkeratosis or evidence of recurrent vulvar dysplasia. There is a slight raised area right at the middle of the fourchette consistent with scarring.  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 She will return to see me in 6 months for follow-up visit. At that time if there is no evidence of dysplasia will return to the care of Dr. Hulan Hood.  She was instructed to do vulvar checks once a month and to notify us if there's any changes.   She wishes to know what exactly occurred this summer with regarding to the vulvar lesion. I cannot really comment except to me it does look like an insect bite with a small necrotic punctate area in the satellite purulence. However, it remains to be unknown. She will deathly notify us if there's recurrent issue.  Jamiya Nims A., MD 11/01/2015, 9:55 AM

## 2015-11-01 NOTE — Patient Instructions (Signed)
Plan to follow up in six months or sooner if needed.  Please call in October or November to schedule.

## 2015-12-11 ENCOUNTER — Other Ambulatory Visit: Payer: Self-pay

## 2015-12-11 MED ORDER — ALPRAZOLAM 0.25 MG PO TABS: 0.2500 mg | ORAL_TABLET | Freq: Every day | ORAL | 0 refills | Status: DC

## 2015-12-11 NOTE — Telephone Encounter (Signed)
Last written and faxed to [facility] 09-13-15 #[phone] Last OV 05-19-15 No Future OV  Rx will need to be printed, signed, and faxed back to [facility] at [phone]

## 2015-12-11 NOTE — Telephone Encounter (Signed)
Rx faxed from external fax to [facility]

## 2015-12-14 DIAGNOSIS — Z23 Encounter for immunization: Secondary | ICD-10-CM | POA: Diagnosis not present

## 2015-12-15 ENCOUNTER — Encounter: Payer: Self-pay | Admitting: Internal Medicine

## 2015-12-15 ENCOUNTER — Ambulatory Visit (INDEPENDENT_AMBULATORY_CARE_PROVIDER_SITE_OTHER): Payer: BLUE CROSS/BLUE SHIELD | Admitting: Internal Medicine

## 2015-12-15 DIAGNOSIS — N3001 Acute cystitis with hematuria: Secondary | ICD-10-CM

## 2015-12-15 LAB — POC URINALSYSI DIPSTICK (AUTOMATED)
BILIRUBIN UA: NEGATIVE
GLUCOSE UA: NEGATIVE
KETONES UA: NEGATIVE
Nitrite, UA: NEGATIVE
Protein, UA: NEGATIVE
SPEC GRAV UA: 1.015
UROBILINOGEN UA: 0.2
pH, UA: 6

## 2015-12-15 MED ORDER — SULFAMETHOXAZOLE-TRIMETHOPRIM 800-160 MG PO TABS: 1.0000 | ORAL_TABLET | Freq: Two times a day (BID) | ORAL | 1 refills | Status: DC

## 2015-12-15 NOTE — Progress Notes (Signed)
Subjective:    Patient ID: Shelly Hood, female    DOB: 1955/07/11, 60 y.o.   MRN: [id number]  HPI Here due to urinary symptoms woke around midnight and felt urinary urgency Got up and it was painful + dysuria as well as the cramps Saw red blood and it felt like contractions (blood in urine--- blood on paper when wiping) Felt better sitting up --so didn't lie back down Got more urgency--but then just passing blood  Urgency is some better this morning --but persists No fever No sweats or chills  May have had 1 UTI 30 years ago  Current Outpatient Prescriptions on File Prior to Visit  Medication Sig Dispense Refill  . acetaminophen (TYLENOL) 500 MG tablet Take 1,000 mg by mouth every 6 (six) hours as needed for mild pain or fever (pain and fever). Reported on 05/17/2015    . ALPRAZolam (XANAX) 0.25 MG tablet Take 1 tablet (0.25 mg total) by mouth daily. 90 tablet 0  . atorvastatin (LIPITOR) 20 MG tablet Take 1 tablet (20 mg total) by mouth daily. 90 tablet 3  . FLUoxetine (PROZAC) 40 MG capsule Take 1 capsule (40 mg total) by mouth daily. 90 capsule 3  . Probiotic Product (PROBIOTIC ADVANCED PO) Take by mouth. Reported on 08/18/2015     No current facility-administered medications on file prior to visit.     Allergies  Allergen Reactions  . Cefotetan Other (See Comments)    Thrombocytopenia, plat ct 18K  . Naproxen     ulcers  . Nsaids     DUODENAL ULCER  . Aspirin     Ulcers   . Ibuprofen     ulcers   . Toradol [Ketorolac Tromethamine]     ulcers  . Codeine Sulfate Nausea Only    REACTION: nausea only tolerates hydrocodone  . Flagyl [Metronidazole] Other (See Comments)    Weak, bad taste     Past Medical History:  Diagnosis Date  . Clostridium difficile colitis 11/30/2014  . Clostridium difficile infection   . Depression   . Gastric outlet obstruction   . GERD (gastroesophageal reflux disease)   . High cholesterol   . Obesity   . Panic attacks   . Peptic  ulcer disease 12/02/2014  . Pyloric stenosis   . Pyloric ulcer   . Thrombocytopenia ([place]) 06/26/2014    Past Surgical History:  Procedure Laterality Date  . ANTRECTOMY N/A 06/24/2014   Procedure: ROBOTIC DISTAL GASTRECTOMY;  Surgeon: Michael Boston, MD;  [facility];  Service: General;  Laterality: N/A;  . COLONOSCOPY  AGE 34  . COLONOSCOPY N/A 10/19/2014   Procedure: COLONOSCOPY;  Surgeon: Gatha Mayer, MD;  Location: [place];  Service: Endoscopy;  Laterality: N/A;  with FMT  . FECAL TRANSPLANT N/A 10/19/2014   Procedure: FECAL TRANSPLANT;  Surgeon: Gatha Mayer, MD;  Location: [place] [place];  Service: Endoscopy;  Laterality: N/A;  . LAPAROSCOPIC ROUX-EN-Y GASTRIC BYPASS WITH HIATAL HERNIA REPAIR N/A 06/24/2014   Procedure: ROBOTIC ROUX-EN-Y CONSTRUCTION WITH HIATAL HERNIA REPAIR;  Surgeon: Michael Boston, MD;  [facility];  Service: General;  Laterality: N/A;  . 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 RHINOPLASTY     X3  . VAGOTOMY N/A 06/24/2014   Procedure: ROBOTIC TRUNCAL VAGOTOMY ;  Surgeon: Michael Boston, MD;  [facility];  Service: General;  Laterality: N/A;  This is [name]'s preference    Family History  Problem Relation  Age of Onset  . Diabetes Mother   . Diabetes Maternal Grandmother   . Heart disease Father   . Breast cancer Paternal Grandmother   . Colon cancer Neg Hx     Social History   Social History  . Marital status: Married    Spouse name: N/A  . Number of children: 2  . Years of education: N/A   Occupational History  . 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 Smoking status: Former Smoker    Packs/day: 0.25    Years: 30.00    Types: Cigarettes    Quit date: 06/18/2014  . Smokeless tobacco: Never Used  . Alcohol use No  . Drug use: No  . Sexual activity: No   Other Topics Concern  . Not on file   Social History Narrative  . No narrative on file    Review of Systems Vulvar lesion is healed No N/V Appetite is okay Same chronic diarrhea No recent sex    Objective:   Physical Exam  Abdominal: Soft. She exhibits no distension. There is no tenderness. There is no rebound and no guarding.  Musculoskeletal:  No CVA tenderness          Assessment & Plan:

## 2015-12-15 NOTE — Assessment & Plan Note (Signed)
Fairly classic symptoms--but hasn't had in decades Will treat with septra She is already on probiotic for history of C diff Hopefully 3 days will be enough

## 2015-12-15 NOTE — Addendum Note (Signed)
Addended by: Pilar Grammes on: 12/15/2015 11:13 AM   Modules accepted: Orders

## 2015-12-15 NOTE — Progress Notes (Signed)
Pre visit review using our clinic review tool, if applicable. No additional management support is needed unless otherwise documented below in the visit note. 

## 2016-02-17 DIAGNOSIS — R300 Dysuria: Secondary | ICD-10-CM | POA: Diagnosis not present

## 2016-02-17 DIAGNOSIS — N390 Urinary tract infection, site not specified: Secondary | ICD-10-CM | POA: Diagnosis not present

## 2016-02-17 DIAGNOSIS — R319 Hematuria, unspecified: Secondary | ICD-10-CM | POA: Diagnosis not present

## 2016-06-01 ENCOUNTER — Other Ambulatory Visit: Payer: Self-pay | Admitting: Internal Medicine

## 2016-06-05 ENCOUNTER — Other Ambulatory Visit: Payer: Self-pay

## 2016-06-05 MED ORDER — ALPRAZOLAM 0.25 MG PO TABS: 0.2500 mg | ORAL_TABLET | Freq: Every day | ORAL | 0 refills | Status: DC

## 2016-06-05 NOTE — Telephone Encounter (Signed)
I have faxed the refill to [facility]

## 2016-06-05 NOTE — Telephone Encounter (Signed)
Last written 12-11-15 #90 Last OV  05-19-15 Next OV 06-28-16  Form on desk in [place] to sign and fax back for refill

## 2016-06-05 NOTE — Telephone Encounter (Signed)
Approved: #[id number] x 0 Form signed 

## 2016-06-09 IMAGING — US US ABDOMEN COMPLETE
1 series · 14 of 25 positions shown · non-contrast
Comparison: None.

CLINICAL DATA: Right lower quadrant pain 2 days.

EXAM:
ULTRASOUND ABDOMEN COMPLETE

[Series 1: us abdomen complete · 0.24mm/px · 14 of 82 slices shown]
[im 1/82]
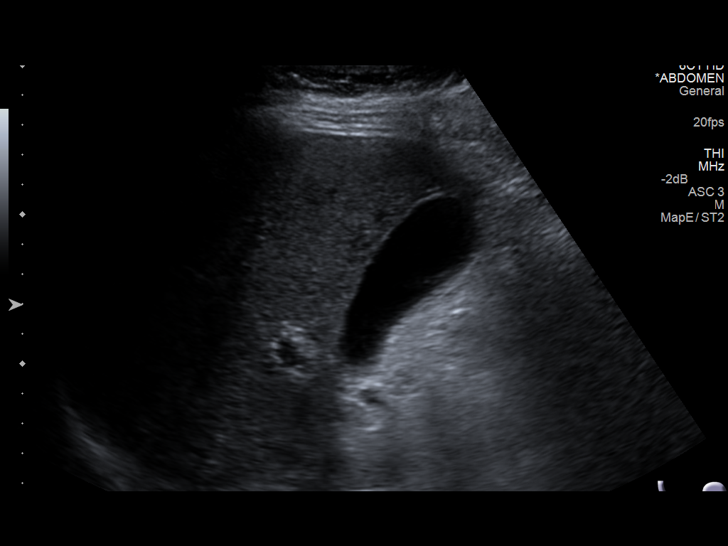
[im 7/82]
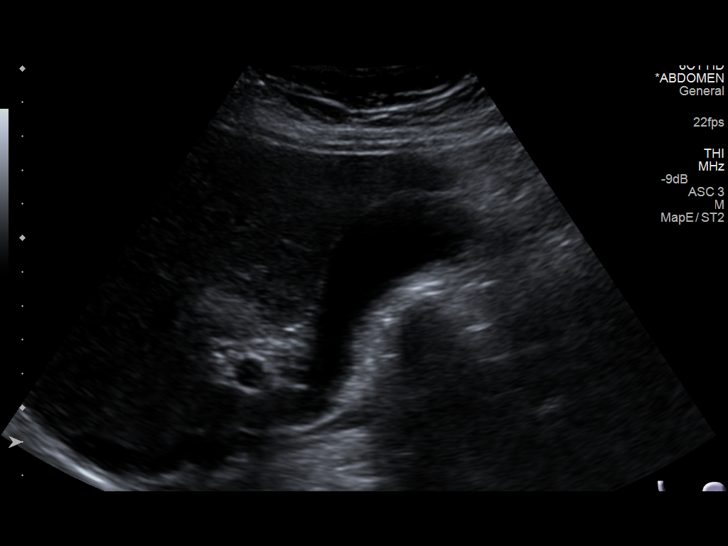
[im 14/82]
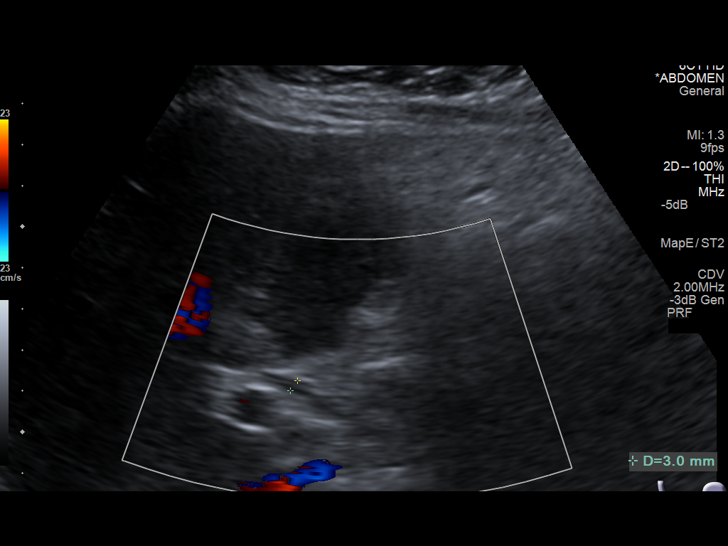
[im 21/82]
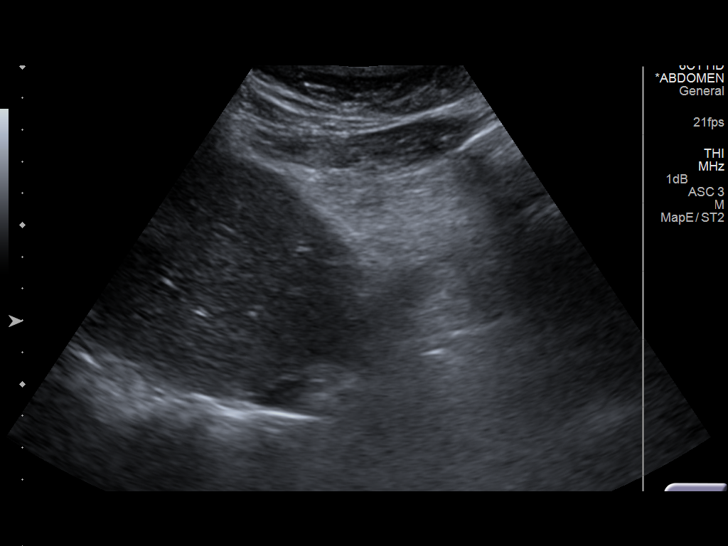
[im 28/82]
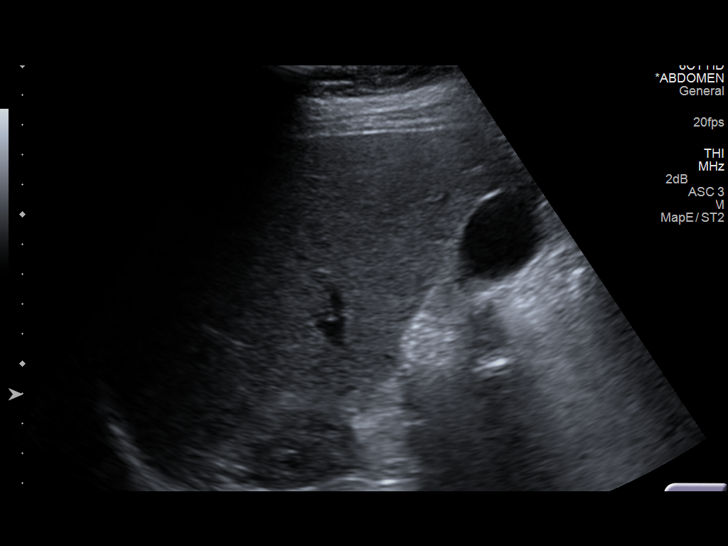
[im 31/82]
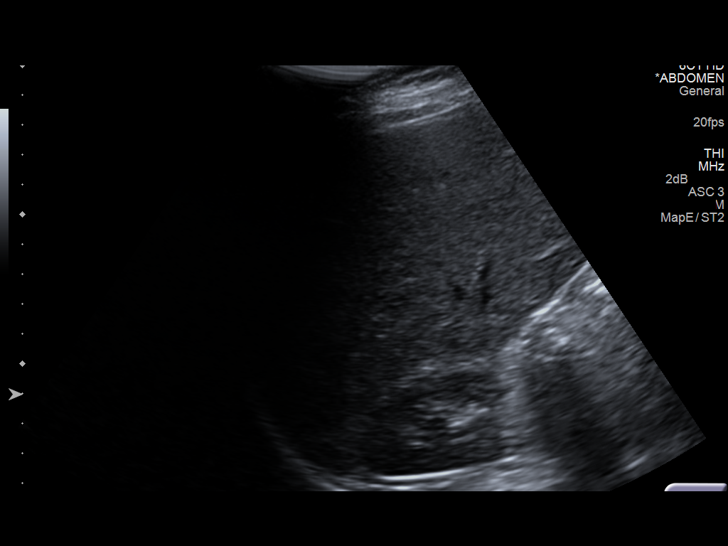
[im 38/82]
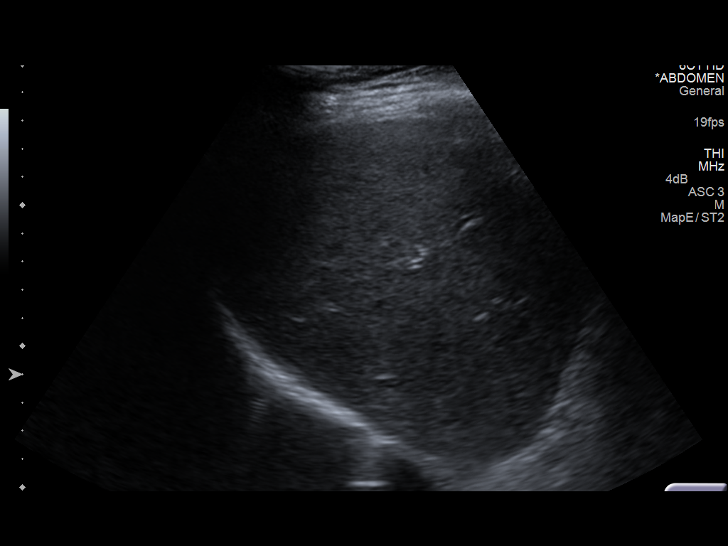
[im 44/82]
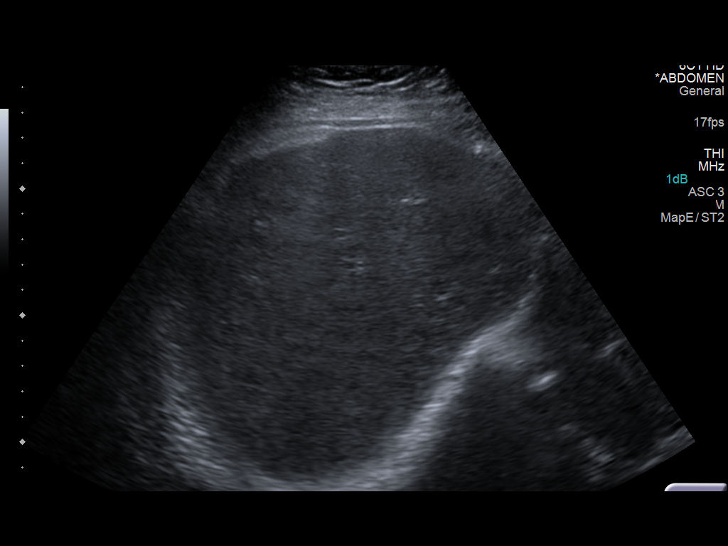
[im 51/82]
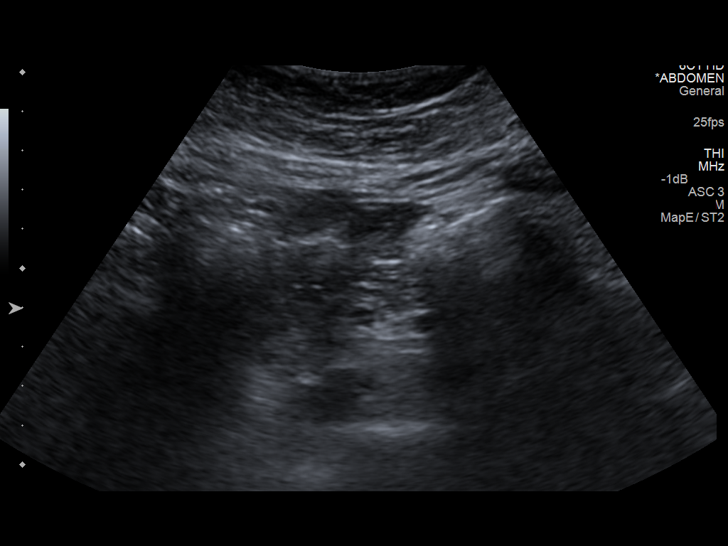
[im 55/82]
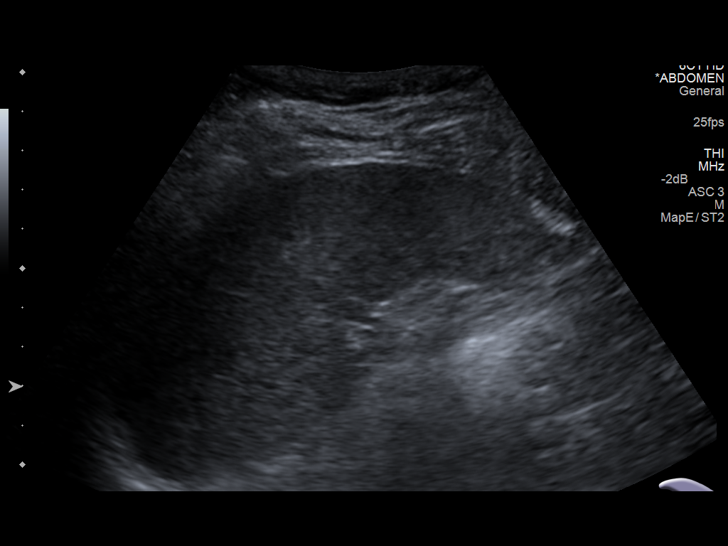
[im 61/82]
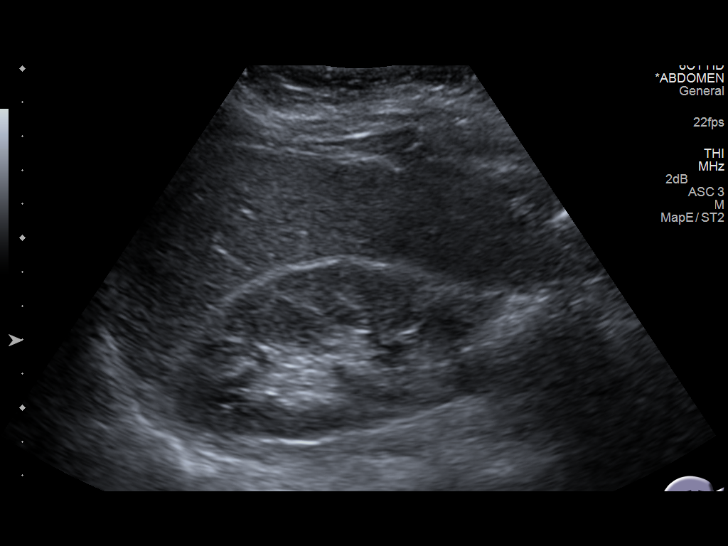
[im 68/82]
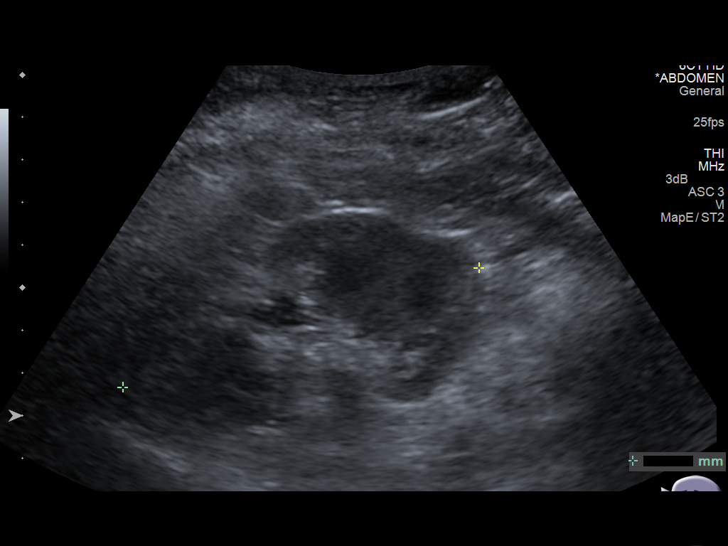
[im 75/82]
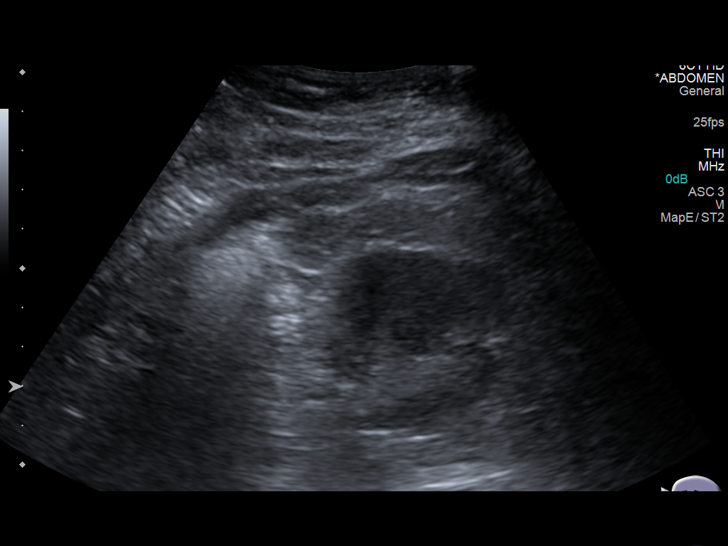
[im 82/82]
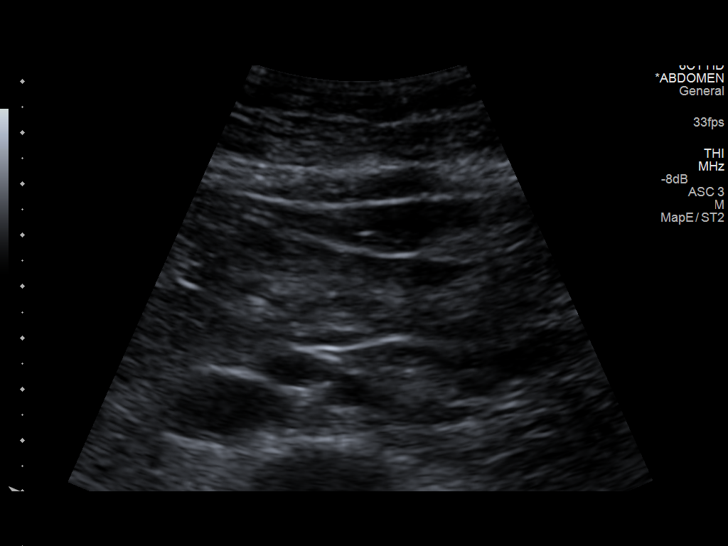

[14 of 25 positions shown; findings below may reference images not displayed]

FINDINGS: Gallbladder: No gallstones or wall thickening visualized. No
sonographic Murphy sign noted.

Common bile duct: Diameter: 3.0 mm.

Liver: 1.9 cm echogenic focus within the central liver likely a
small hemangioma or focal fatty change.

IVC: No abnormality visualized.

Pancreas: Visualized portion unremarkable.

Spleen: Size and appearance within normal limits.

Right Kidney: Length: 10.8 cm. Echogenicity within normal limits. No
mass or hydronephrosis visualized.

Left Kidney: Length: Lower limits of normal in size measuring
cm. Echogenicity within normal limits. No mass or hydronephrosis
visualized.

Abdominal aorta: No aneurysm visualized.

Other findings: None.
IMPRESSION: No acute hepatobiliary disease.

Focal echogenic center liver lesion measuring 1.9 cm likely a
hemangioma versus focal fatty change.

## 2016-06-13 ENCOUNTER — Ambulatory Visit (INDEPENDENT_AMBULATORY_CARE_PROVIDER_SITE_OTHER): Payer: BLUE CROSS/BLUE SHIELD | Admitting: Obstetrics & Gynecology

## 2016-06-13 ENCOUNTER — Encounter: Payer: Self-pay | Admitting: Obstetrics & Gynecology

## 2016-06-13 VITALS — BP 111/70 | HR 71 | Resp 18 | Ht 59.0 in | Wt 144.0 lb

## 2016-06-13 DIAGNOSIS — Z1151 Encounter for screening for human papillomavirus (HPV): Secondary | ICD-10-CM

## 2016-06-13 DIAGNOSIS — M899 Disorder of bone, unspecified: Secondary | ICD-10-CM | POA: Diagnosis not present

## 2016-06-13 DIAGNOSIS — Z01419 Encounter for gynecological examination (general) (routine) without abnormal findings: Secondary | ICD-10-CM | POA: Diagnosis not present

## 2016-06-13 DIAGNOSIS — E785 Hyperlipidemia, unspecified: Secondary | ICD-10-CM | POA: Diagnosis not present

## 2016-06-13 DIAGNOSIS — M949 Disorder of cartilage, unspecified: Secondary | ICD-10-CM | POA: Diagnosis not present

## 2016-06-13 NOTE — Progress Notes (Signed)
Subjective:    Shelly Hood is a 61 y.o. MW P2 female who presents for an annual exam. The patient has no complaints today. The patient is sexually active. GYN screening history: last pap: was normal. The patient wears seatbelts: yes. The patient participates in regular exercise: yes. Has the patient ever been transfused or tattooed?: no. The patient reports that there is not domestic violence in her life.   Menstrual History: OB History    Gravida Para Term Preterm AB Living   2         2   SAB TAB Ectopic Multiple Live Births           2      Menarche age: 38 No LMP recorded. Patient is postmenopausal. Menopause at 61 yo.    The following portions of the patient's history were reviewed and updated as appropriate: allergies, current medications, past family history, past medical history, past social history, past surgical history and problem list.  Review of Systems Pertinent items are noted in HPI.   Married for 25 years, uses lube with sex, no dyspareunia FH- + breast cancer pGM, no gyn or colon cancer   Objective:    BP 111/70 (BP Location: Left Arm, Patient Position: Sitting, Cuff Size: Normal)   Pulse 71   Resp 18   Ht 4\' 11"  (1.499 m)   Wt 144 lb (65.3 kg)   BMI 29.08 kg/m   General Appearance:    Alert, cooperative, no distress, appears stated age  Head:    Normocephalic, without obvious abnormality, atraumatic  Eyes:    PERRL, conjunctiva/corneas clear, EOM's intact, fundi    benign, both eyes  Ears:    Normal TM's and external ear canals, both ears  Nose:   Nares normal, septum midline, mucosa normal, no drainage    or sinus tenderness  Throat:   Lips, mucosa, and tongue normal; teeth and gums normal  Neck:   Supple, symmetrical, trachea midline, no adenopathy;    thyroid:  no enlargement/tenderness/nodules; no carotid   bruit or JVD  Back:     Symmetric, no curvature, ROM normal, no CVA tenderness  Lungs:     Clear to auscultation bilaterally, respirations  unlabored  Chest Wall:    No tenderness or deformity   Heart:    Regular rate and rhythm, S1 and S2 normal, no murmur, rub   or gallop  Breast Exam:    No tenderness, masses, or nipple abnormality  Abdomen:     Soft, non-tender, bowel sounds active all four quadrants,    no masses, no organomegaly  Genitalia:    Normal female without lesion, discharge or tenderness, no vulvar lesions, severe v v a, normal appearing cervix, NSSA, NT, no adnexal masses     Extremities:   Extremities normal, atraumatic, no cyanosis or edema  Pulses:   2+ and symmetric all extremities  Skin:   Skin color, texture, turgor normal, no rashes or lesions  Lymph nodes:   Cervical, supraclavicular, and axillary nodes normal  Neurologic:   CNII-XII intact, normal strength, sensation and reflexes    throughout  .    Assessment:    Healthy female exam.    Plan:     Thin prep Pap smear. with testing Fasting labs today and she will follow up with Dr. Silvio Pate next week

## 2016-06-16 IMAGING — US US IMAGE GUIDED DRAINAGE BY PERCUTANEOUS CATHETER
1 series · 8 of 8 positions shown · non-contrast
Comparison: none

CLINICAL DATA: 58-year-old female with a history of prior gastric
bypass procedure with subsequent postoperative fluid collection in
the upper abdomen. She has been referred for drainage.

[Series 1: us image guided drainage by percutaneous catheter · 0.19mm/px · 8 of 8 slices shown]
[im 1/8]
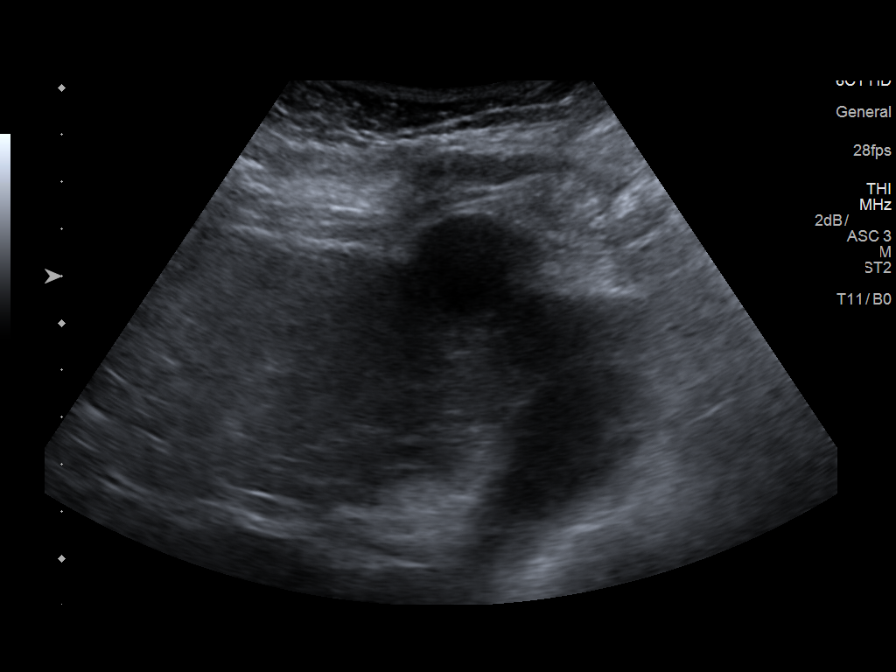
[im 2/8]
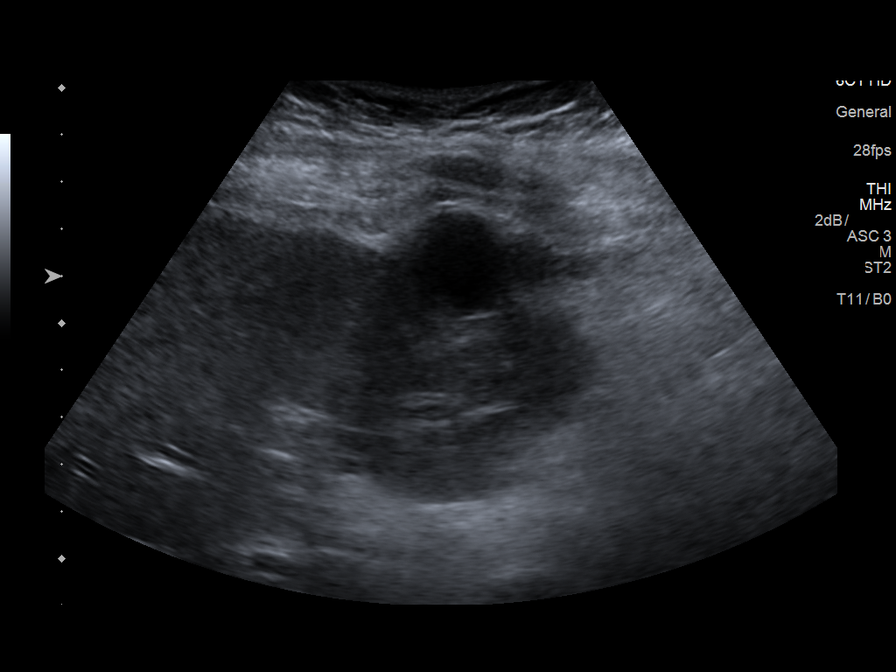
[im 3/8]
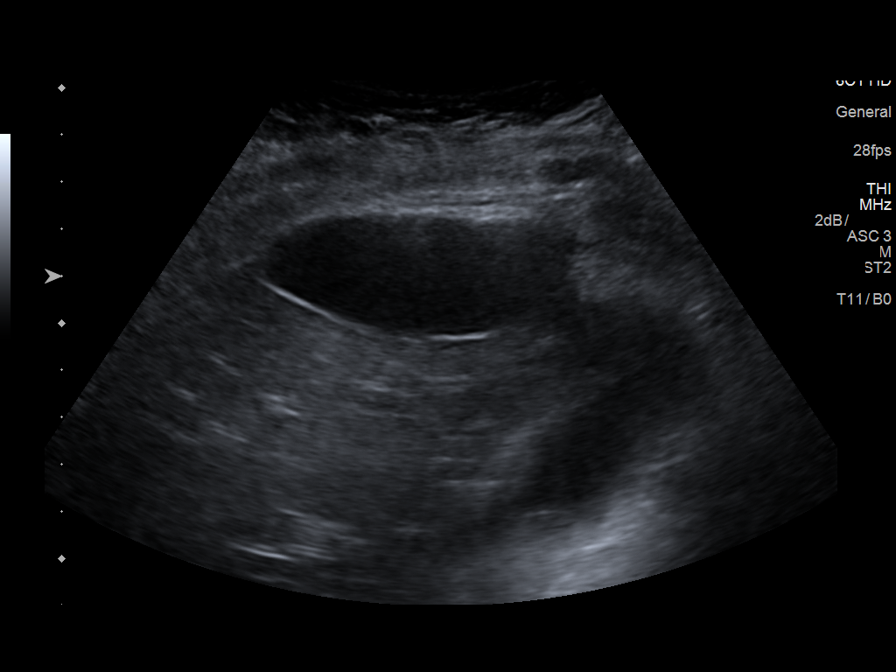
[im 4/8]
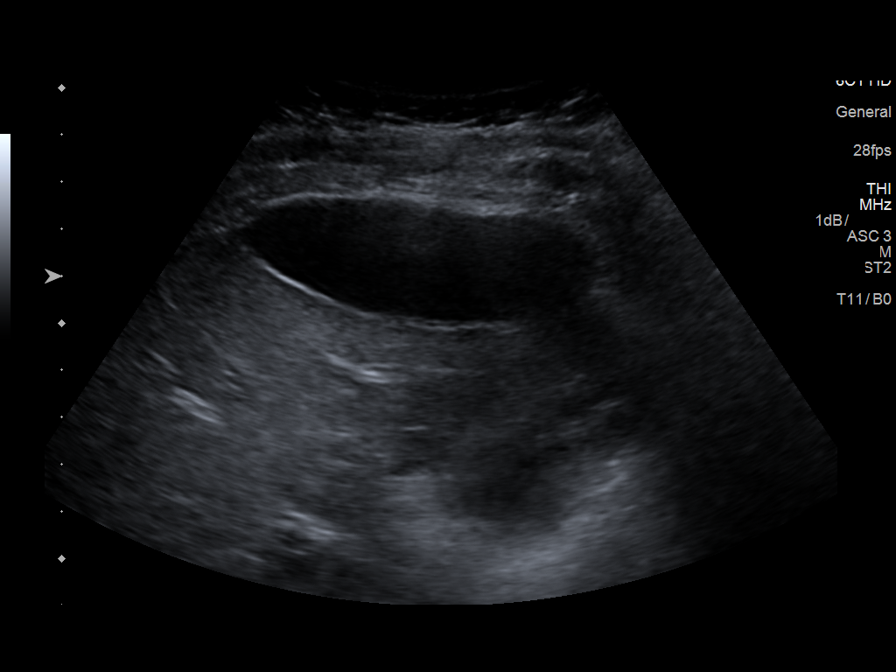
[im 5/8]
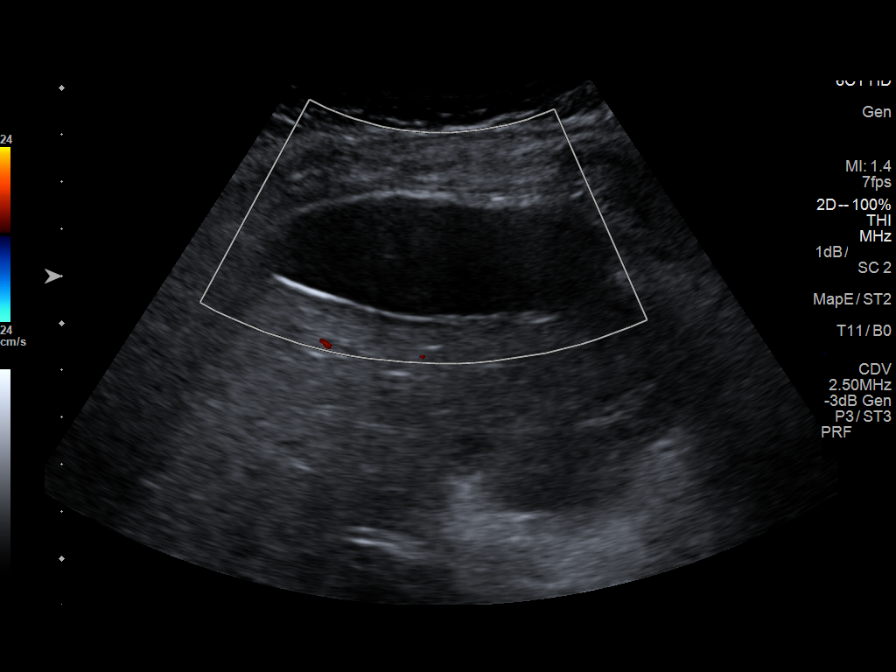
[im 6/8]
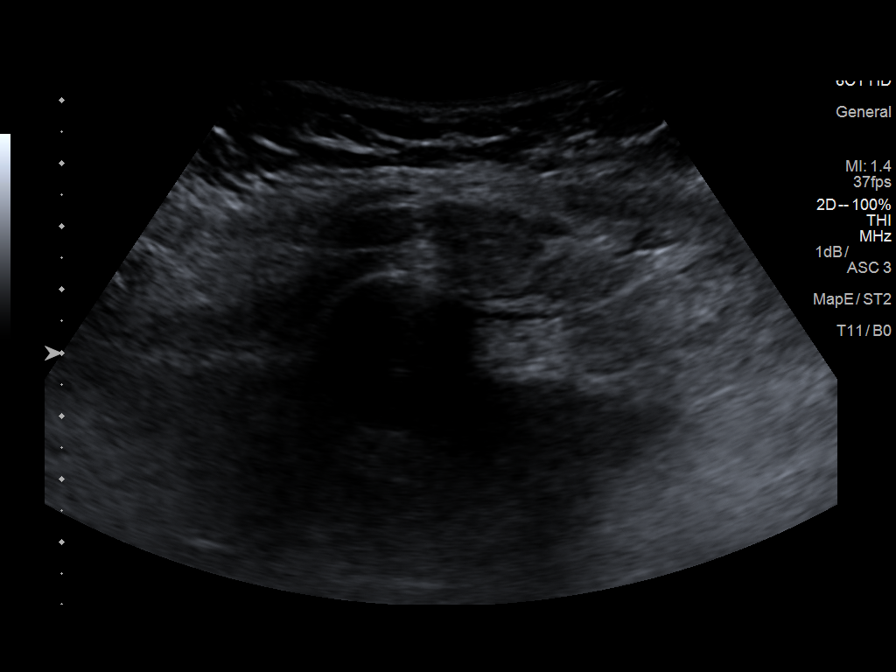
[im 7/8]
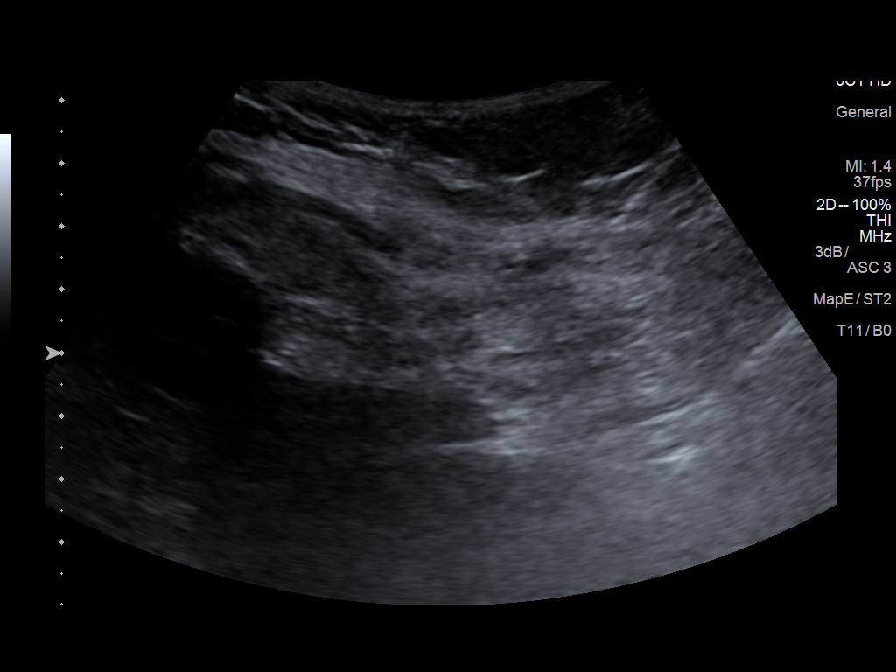
[im 8/8]
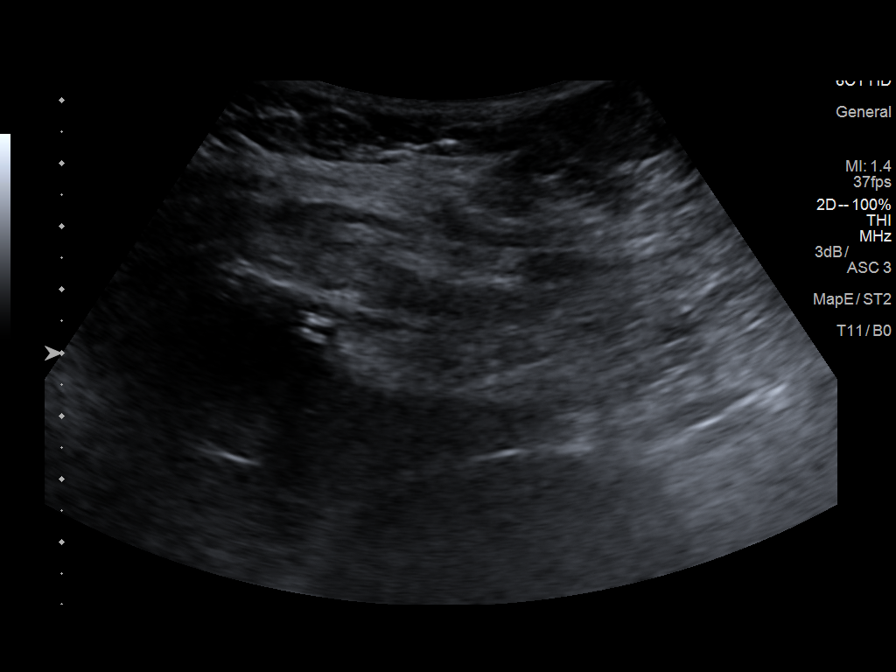

[8 of 8 positions shown; findings below may reference images not displayed]

EXAM:
ULTRASOUND GUIDED DRAINAGE OF RIGHT UPPER QUADRANT FLUID COLLECTION

MEDICATIONS:
2.0 mg IV Versed; 200 mcg IV Fentanyl

Total Moderate Sedation Time: 20

PROCEDURE:
The procedure, risks, benefits, and alternatives were explained to
the patient. Questions regarding the procedure were encouraged and
answered. The patient understands and consents to the procedure.

Patient is position supine position on a stretcher. Ultrasound
survey of the upper abdomen was performed with images stored and
sent to PACs.

The right upper quadrant was prepped with chlorhexidine in a sterile
fashion, and a sterile drape was applied covering the operative
field. A sterile gown and sterile gloves were used for the
procedure. Local anesthesia was provided with 1% Lidocaine.

Using ultrasound guidance the skin and subcutaneous tissues were
generously infiltrated with 1% lidocaine to the level of the
abdominal wall.

Using ultrasound imaging a 10 French drain was advanced into the
fluid collection using a trocar technique.

Once the drain was in place approximately 90 cc of bilious fluid was
aspirated. A sample sent to the lab.

Retention suture was placed in the drain was placed to gravity bag.

Patient tolerated the procedure well and remained hemodynamically
stable throughout.

No complications encountered and no significant blood loss
encounter.

COMPLICATIONS:
None.
FINDINGS: Ultrasound images demonstrate fluid collection overlying the liver.

90 cc of bilious fluid was drained from the fluid collection.
IMPRESSION: Status post ultrasound-guided drain placement into right upper
quadrant fluid collection with 90 cc of bilious fluid aspirated.

## 2016-06-17 LAB — COMPREHENSIVE METABOLIC PANEL
ALBUMIN: 4.5 g/dL (ref 3.5–5.5)
ALT: 38 IU/L — ABNORMAL HIGH (ref 0–32)
AST: 32 IU/L (ref 0–40)
Albumin/Globulin Ratio: 2 (ref 1.2–2.2)
Alkaline Phosphatase: 98 IU/L (ref 39–117)
BUN/Creatinine Ratio: 12 (ref 9–23)
BUN: 9 mg/dL (ref 6–24)
Bilirubin Total: 0.4 mg/dL (ref 0.0–1.2)
CALCIUM: 9.3 mg/dL (ref 8.7–10.2)
CO2: 25 mmol/L (ref 18–29)
Chloride: 101 mmol/L (ref 96–106)
Creatinine, Ser: 0.76 mg/dL (ref 0.57–1.00)
GFR, EST AFRICAN AMERICAN: 100 mL/min/{1.73_m2} (ref >59)
GFR, EST NON AFRICAN AMERICAN: 87 mL/min/{1.73_m2} (ref >59)
GLOBULIN, TOTAL: 2.3 g/dL (ref 1.5–4.5)
Glucose: 77 mg/dL (ref 65–99)
Potassium: 4.7 mmol/L (ref 3.5–5.2)
SODIUM: 141 mmol/L (ref 134–144)
TOTAL PROTEIN: 6.8 g/dL (ref 6.0–8.5)

## 2016-06-17 LAB — CBC
HEMOGLOBIN: 13.5 g/dL (ref 11.1–15.9)
Hematocrit: 41.1 % (ref 34.0–46.6)
MCH: 30.1 pg (ref 26.6–33.0)
MCHC: 32.8 g/dL (ref 31.5–35.7)
MCV: 92 fL (ref 79–97)
PLATELETS: 310 10*3/uL (ref 150–379)
RBC: 4.48 x10E6/uL (ref 3.77–5.28)
RDW: 13.8 % (ref 12.3–15.4)
WBC: 6.4 10*3/uL (ref 3.4–10.8)

## 2016-06-17 LAB — CYTOLOGY - PAP
DIAGNOSIS: NEGATIVE
HPV (WINDOPATH): NOT DETECTED

## 2016-06-17 LAB — LIPID PANEL
Chol/HDL Ratio: 4 ratio (ref 0.0–4.4)
Cholesterol, Total: 189 mg/dL (ref 100–199)
HDL: 47 mg/dL (ref >39)
LDL CALC: 91 mg/dL (ref 0–99)
Triglycerides: 257 mg/dL — ABNORMAL HIGH (ref 0–149)
VLDL CHOLESTEROL CAL: 51 mg/dL — AB (ref 5–40)

## 2016-06-17 LAB — VITAMIN D 1,25 DIHYDROXY
Vitamin D 1, 25 (OH)2 Total: 54 pg/mL
Vitamin D2 1, 25 (OH)2: <10 pg/mL
Vitamin D3 1, 25 (OH)2: 54 pg/mL

## 2016-06-17 LAB — TSH: TSH: 1.27 u[IU]/mL (ref 0.450–4.500)

## 2016-06-19 ENCOUNTER — Telehealth: Payer: Self-pay | Admitting: *Deleted

## 2016-06-19 IMAGING — US US THORACENTESIS ASP PLEURAL SPACE W/IMG GUIDE
1 series · 3 of 3 positions shown · non-contrast
Comparison: CXR 12/04/2014.

MEDICATIONS:
None

COMPLICATIONS:
None immediate

INDICATION: Symptomatic right sided pleural effusion

EXAM:
US THORACENTESIS ASP PLEURAL SPACE W/IMG GUIDE
TECHNIQUE: Informed written consent was obtained from the patient after a
discussion of the risks, benefits and alternatives to treatment. A
timeout was performed prior to the initiation of the procedure.

[Series 1: us thoracentesis asp pleural space w/img guide · 0.26mm/px · 3 of 3 slices shown]
[im 1/3]
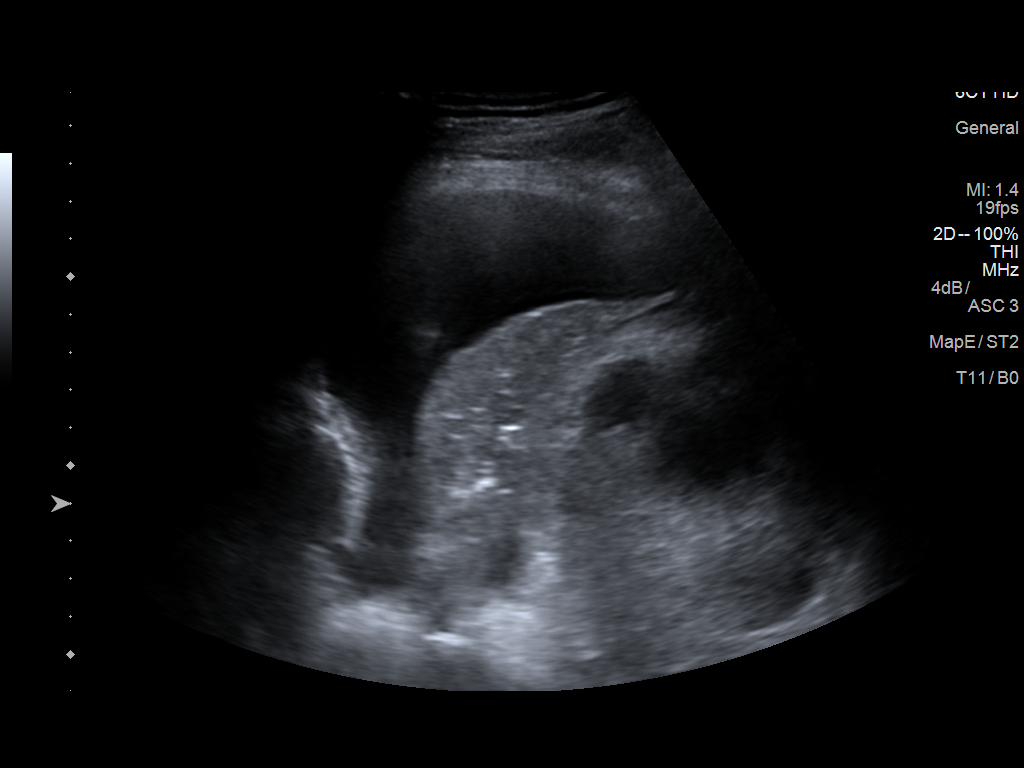
[im 2/3]
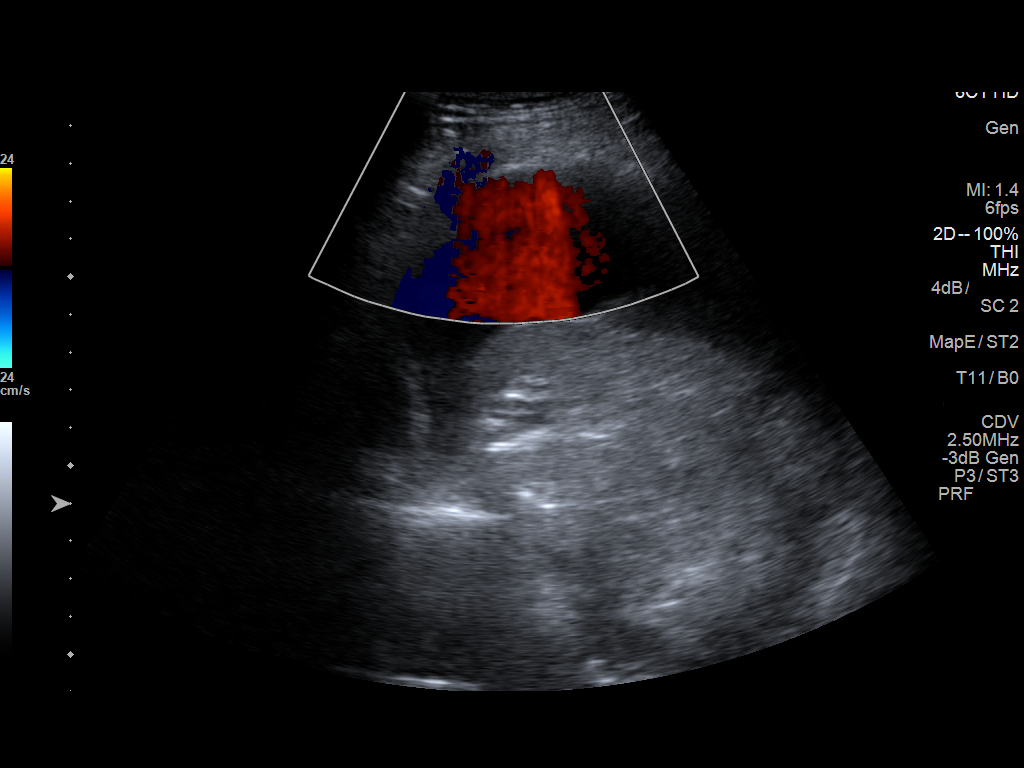
[im 3/3]
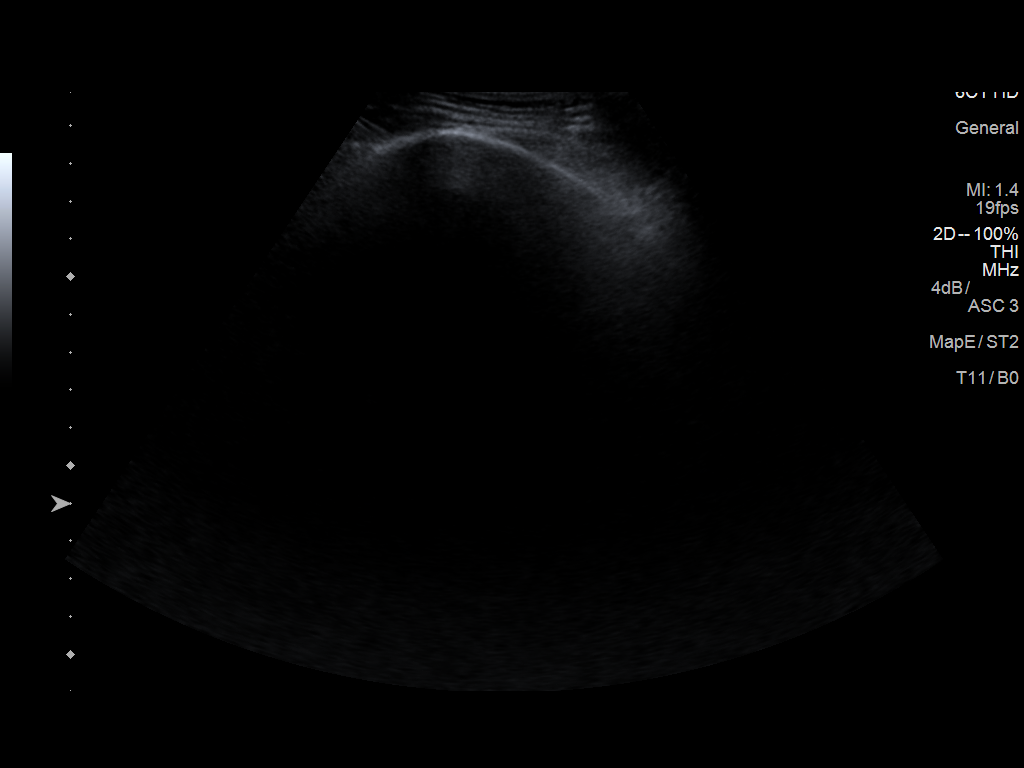

[3 of 3 positions shown; findings below may reference images not displayed]

Initial ultrasound scanning demonstrates a right pleural effusion.
The lower chest was prepped and draped in the usual sterile fashion.
1% lidocaine was used for local anesthesia.

Under direct ultrasound guidance, a 19 gauge, 7-cm, Yueh catheter
was introduced. An ultrasound image was saved for documentation
purposes. The thoracentesis was performed. The catheter was removed
and a dressing was applied. The patient tolerated the procedure well
without immediate post procedural complication. The patient was
escorted to have an upright chest radiograph.
FINDINGS: A total of approximately 700 ml of serous fluid was removed.
Requested samples were sent to the laboratory.
IMPRESSION: Successful ultrasound-guided right sided thoracentesis yielding 700
ml of pleural fluid.

## 2016-06-19 NOTE — Telephone Encounter (Signed)
Informed pt of results and recommendation.

## 2016-06-19 NOTE — Telephone Encounter (Signed)
-----   Message from Emily Filbert, MD sent at 06/19/2016 10:55 AM EDT ----- She needs to see Dr. Silvio Pate about her lipids. Thanks

## 2016-06-28 ENCOUNTER — Ambulatory Visit (INDEPENDENT_AMBULATORY_CARE_PROVIDER_SITE_OTHER): Payer: BLUE CROSS/BLUE SHIELD | Admitting: Internal Medicine

## 2016-06-28 ENCOUNTER — Encounter: Payer: Self-pay | Admitting: Internal Medicine

## 2016-06-28 VITALS — BP 102/70 | HR 67 | Temp 98.2°F | Ht 59.75 in | Wt 145.0 lb

## 2016-06-28 DIAGNOSIS — Z0000 Encounter for general adult medical examination without abnormal findings: Secondary | ICD-10-CM

## 2016-06-28 DIAGNOSIS — F419 Anxiety disorder, unspecified: Secondary | ICD-10-CM

## 2016-06-28 MED ORDER — ALPRAZOLAM 0.25 MG PO TABS: 0.2500 mg | ORAL_TABLET | Freq: Every day | ORAL | 0 refills | Status: DC | PRN

## 2016-06-28 MED ORDER — ATORVASTATIN CALCIUM 20 MG PO TABS: 20.0000 mg | ORAL_TABLET | Freq: Every day | ORAL | 3 refills | Status: DC

## 2016-06-28 MED ORDER — FLUOXETINE HCL 40 MG PO CAPS: 40.0000 mg | ORAL_CAPSULE | Freq: Every day | ORAL | 3 refills | Status: DC

## 2016-06-28 NOTE — Assessment & Plan Note (Signed)
Doing well Asked her to try using the alprazolam as needed

## 2016-06-28 NOTE — Assessment & Plan Note (Signed)
Healthy Colon due 2026 mammo due next year Just had pap Works on fitness

## 2016-06-28 NOTE — Progress Notes (Signed)
Pre visit review using our clinic review tool, if applicable. No additional management support is needed unless otherwise documented below in the visit note. 

## 2016-06-28 NOTE — Progress Notes (Signed)
Subjective:    Patient ID: Shelly Hood, female    DOB: 1955/09/30, 61 y.o.   MRN: [id number]  HPI Here for physical Recent gyn exam with Dr Darlys Gales is good  Still very busy at work Discussed trying to make time off for herself Trying to eat right---walks regularly Discussed resistance exercise  Current Outpatient Prescriptions on File Prior to Visit  Medication Sig Dispense Refill  . acetaminophen (TYLENOL) 500 MG tablet Take 1,000 mg by mouth every 6 (six) hours as needed for mild pain or fever (pain and fever). Reported on 05/17/2015    . ALPRAZolam (XANAX) 0.25 MG tablet Take 1 tablet (0.25 mg total) by mouth daily. 90 tablet 0  . atorvastatin (LIPITOR) 20 MG tablet TAKE 1 TABLET DAILY 90 tablet 0  . FLUoxetine (PROZAC) 40 MG capsule TAKE 1 CAPSULE DAILY 90 capsule 0  . Probiotic Product (PROBIOTIC ADVANCED PO) Take by mouth. Reported on 08/18/2015     No current facility-administered medications on file prior to visit.     Allergies  Allergen Reactions  . Cefotetan Other (See Comments)    Thrombocytopenia, plat ct 18K  . Naproxen     ulcers  . Nsaids     DUODENAL ULCER  . Aspirin     Ulcers   . Ibuprofen     ulcers   . Toradol [Ketorolac Tromethamine]     ulcers  . Codeine Sulfate Nausea Only    REACTION: nausea only tolerates hydrocodone  . Flagyl [Metronidazole] Other (See Comments)    Weak, bad taste     Past Medical History:  Diagnosis Date  . Clostridium difficile colitis 11/30/2014  . Clostridium difficile infection   . Depression   . Gastric outlet obstruction   . GERD (gastroesophageal reflux disease)   . High cholesterol   . Obesity   . Panic attacks   . Peptic ulcer disease 12/02/2014  . Pyloric stenosis   . Pyloric ulcer   . Thrombocytopenia ([place]) 06/26/2014    Past Surgical History:  Procedure Laterality Date  . ANTRECTOMY N/A 06/24/2014   Procedure: ROBOTIC DISTAL GASTRECTOMY;  Surgeon: Michael Boston, MD;  [facility];  Service:  General;  Laterality: N/A;  . COLONOSCOPY  AGE 52  . COLONOSCOPY N/A 10/19/2014   Procedure: COLONOSCOPY;  Surgeon: Gatha Mayer, MD;  Location: [facility];  Service: Endoscopy;  Laterality: N/A;  with FMT  . FECAL TRANSPLANT N/A 10/19/2014   Procedure: FECAL TRANSPLANT;  Surgeon: Gatha Mayer, MD;  Location: [facility];  Service: Endoscopy;  Laterality: N/A;  . LAPAROSCOPIC ROUX-EN-Y GASTRIC BYPASS WITH HIATAL HERNIA REPAIR N/A 06/24/2014   Procedure: ROBOTIC ROUX-EN-Y CONSTRUCTION WITH HIATAL HERNIA REPAIR;  Surgeon: Michael Boston, MD;  [facility];  Service: General;  Laterality: N/A;  . 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 RHINOPLASTY     X3  . VAGOTOMY N/A 06/24/2014   Procedure: ROBOTIC TRUNCAL VAGOTOMY ;  Surgeon: Michael Boston, MD;  [facility];  Service: General;  Laterality: N/A;  This is [name]'s preference    Family History  Problem Relation Age of Onset  . Diabetes Mother   . Diabetes Maternal Grandmother   . Heart disease Father   . Breast cancer Paternal Grandmother   . Colon cancer Neg Hx     Social History   Social History  . Marital status: Married    Spouse name: N/A  . Number of  children: 2  . Years of education: N/A   Occupational History  . 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 Smoking status: Former Smoker    Packs/day: 0.25    Years: 30.00    Types: Cigarettes    Quit date: 06/18/2014  . Smokeless tobacco: Never Used  . Alcohol use No  . Drug use: No  . Sexual activity: Not Currently    Partners: Male    Birth control/ protection: Post-menopausal   Other Topics Concern  . Not on file   Social History Narrative  . No narrative on file   Review of Systems  Constitutional: Negative for fatigue and unexpected weight change.       Wears seat belt  HENT: Negative for dental problem, hearing loss and tinnitus.        Keeps up with dentist  Eyes:  Negative for visual disturbance.       No diplopia or unilateral vision loss  Respiratory: Negative for cough, chest tightness and shortness of breath.   Cardiovascular: Negative for chest pain, palpitations and leg swelling.  Gastrointestinal: Positive for diarrhea. Negative for constipation and nausea.       Still on probiotic  Endocrine: Negative for polydipsia and polyuria.  Genitourinary: Negative for dyspareunia, dysuria and hematuria.       Had recurrent UTI--but resolved with Rx  Musculoskeletal: Positive for arthralgias. Negative for back pain and joint swelling.       Right shoulder still painful at times  Skin: Negative for rash.       Dry skin No suspicious lesions  Allergic/Immunologic: Negative for environmental allergies and immunocompromised state.  Neurological: Negative for dizziness, syncope, light-headedness and headaches.  Hematological: Negative for adenopathy. Does not bruise/bleed easily.  Psychiatric/Behavioral: Positive for dysphoric mood. Negative for sleep disturbance.       Mood controlled with daily fluoxetine and AM alprazolam       Objective:   Physical Exam  Constitutional: She is oriented to person, place, and time. She appears well-developed and well-nourished. No distress.  HENT:  Head: Normocephalic and atraumatic.  Right Ear: External ear normal.  Left Ear: External ear normal.  Mouth/Throat: Oropharynx is clear and moist. No oropharyngeal exudate.  Eyes: Conjunctivae are normal. Pupils are equal, round, and reactive to light.  Neck: Normal range of motion. Neck supple. No thyromegaly present.  Cardiovascular: Normal rate, regular rhythm, normal heart sounds and intact distal pulses.  Exam reveals no gallop.   No murmur heard. Pulmonary/Chest: Effort normal and breath sounds normal. No respiratory distress. She has no wheezes. She has no rales.  Abdominal: Soft. There is no tenderness.  Musculoskeletal: She exhibits no edema or tenderness.    Lymphadenopathy:    She has no cervical adenopathy.  Neurological: She is alert and oriented to person, place, and time.  Skin: No rash noted. No erythema.  Psychiatric: She has a normal mood and affect. Her behavior is normal.          Assessment & Plan:

## 2016-08-19 ENCOUNTER — Other Ambulatory Visit: Payer: Self-pay | Admitting: Internal Medicine

## 2016-11-06 ENCOUNTER — Encounter: Payer: Self-pay | Admitting: Internal Medicine

## 2016-11-06 ENCOUNTER — Ambulatory Visit (INDEPENDENT_AMBULATORY_CARE_PROVIDER_SITE_OTHER): Payer: BLUE CROSS/BLUE SHIELD | Admitting: Internal Medicine

## 2016-11-06 VITALS — BP 118/64 | HR 62 | Temp 97.5°F | Wt 146.0 lb

## 2016-11-06 DIAGNOSIS — G500 Trigeminal neuralgia: Secondary | ICD-10-CM | POA: Diagnosis not present

## 2016-11-06 MED ORDER — VALACYCLOVIR HCL 1 G PO TABS: 2000.0000 mg | ORAL_TABLET | Freq: Once | ORAL | 1 refills | Status: AC

## 2016-11-06 MED ORDER — ALPRAZOLAM 0.25 MG PO TABS: 0.2500 mg | ORAL_TABLET | Freq: Every day | ORAL | 0 refills | Status: DC | PRN

## 2016-11-06 NOTE — Progress Notes (Signed)
Subjective:    Patient ID: Shelly Hood, female    DOB: 1955-12-03, 61 y.o.   MRN: [id number]  HPI Here due to ear pain  Left ear pain is "exrutiating, shooting" pain Gets pain in temple, hair --face hurt a few days ago Has had this before--usually gets fever blister after Tried tylenol last night--not really helpful Used left over oxycodone--this did help Is some better today  No rash on face Did spend some time outside doing yard work 4 days ago---face got all red. Took a while to settle down  Current Outpatient Prescriptions on File Prior to Visit  Medication Sig Dispense Refill  . acetaminophen (TYLENOL) 500 MG tablet Take 1,000 mg by mouth every 6 (six) hours as needed for mild pain or fever (pain and fever). Reported on 05/17/2015    . ALPRAZolam (XANAX) 0.25 MG tablet Take 1 tablet (0.25 mg total) by mouth daily as needed for anxiety. 1 tablet 0  . atorvastatin (LIPITOR) 20 MG tablet TAKE 1 TABLET DAILY 90 tablet 3  . FLUoxetine (PROZAC) 40 MG capsule TAKE 1 CAPSULE DAILY 90 capsule 3  . Probiotic Product (PROBIOTIC ADVANCED PO) Take by mouth. Reported on 08/18/2015     No current facility-administered medications on file prior to visit.     Allergies  Allergen Reactions  . Cefotetan Other (See Comments)    Thrombocytopenia, plat ct 18K  . Naproxen     ulcers  . Nsaids     DUODENAL ULCER  . Aspirin     Ulcers   . Ibuprofen     ulcers   . Toradol [Ketorolac Tromethamine]     ulcers  . Codeine Sulfate Nausea Only    REACTION: nausea only tolerates hydrocodone  . Flagyl [Metronidazole] Other (See Comments)    Weak, bad taste     Past Medical History:  Diagnosis Date  . Clostridium difficile colitis 11/30/2014  . Clostridium difficile infection   . Depression   . Gastric outlet obstruction   . GERD (gastroesophageal reflux disease)   . High cholesterol   . Obesity   . Panic attacks   . Peptic ulcer disease 12/02/2014  . Pyloric stenosis   . Pyloric  ulcer   . Thrombocytopenia ([place]) 06/26/2014    Past Surgical History:  Procedure Laterality Date  . ANTRECTOMY N/A 06/24/2014   Procedure: ROBOTIC DISTAL GASTRECTOMY;  Surgeon: Michael Boston, MD;  [facility];  Service: General;  Laterality: N/A;  . COLONOSCOPY  AGE 30  . COLONOSCOPY N/A 10/19/2014   Procedure: COLONOSCOPY;  Surgeon: Gatha Mayer, MD;  Location: [place];  Service: Endoscopy;  Laterality: N/A;  with FMT  . FECAL TRANSPLANT N/A 10/19/2014   Procedure: FECAL TRANSPLANT;  Surgeon: Gatha Mayer, MD;  Location: [place];  Service: Endoscopy;  Laterality: N/A;  . LAPAROSCOPIC ROUX-EN-Y GASTRIC BYPASS WITH HIATAL HERNIA REPAIR N/A 06/24/2014   Procedure: ROBOTIC ROUX-EN-Y CONSTRUCTION WITH HIATAL HERNIA REPAIR;  Surgeon: Michael Boston, MD;  [facility];  Service: General;  Laterality: N/A;  . 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 RHINOPLASTY     X3  . VAGOTOMY N/A 06/24/2014   Procedure: ROBOTIC TRUNCAL VAGOTOMY ;  Surgeon: Michael Boston, MD;  [facility];  Service: General;  Laterality: N/A;  This is [name]'s preference    Family History  Problem Relation Age of Onset  . Diabetes Mother   . Diabetes Maternal Grandmother   .  Heart disease Father   . Heart disease Brother        Quarry manager  . Diabetes Brother   . Breast cancer Paternal Grandmother   . Colon cancer Neg Hx     Social History   Social History  . Marital status: Married    Spouse name: N/A  . Number of children: 2  . Years of education: N/A   Occupational History  . 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 Smoking status: Former Smoker    Packs/day: 0.25    Years: 30.00    Types: Cigarettes    Quit date: 06/18/2014  . Smokeless tobacco: Never Used  . Alcohol use No  . Drug use: No  . Sexual activity: Not Currently    Partners: Male    Birth control/ protection: Post-menopausal    Other Topics Concern  . Not on file   Social History Narrative  . No narrative on file   Review of Systems  Not sick No cold symptoms No fever No hearing loss or tinnitus [place] for [place]     Objective:   Physical Exam  HENT:  TMs and canals are normal No tragal pain  Neurological:  Normal sensation in face          Assessment & Plan:

## 2016-11-06 NOTE — Assessment & Plan Note (Signed)
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

## 2016-11-06 NOTE — Patient Instructions (Addendum)
Please let me know if you are still having pain on Tuesday.

## 2016-12-02 ENCOUNTER — Telehealth: Payer: Self-pay

## 2016-12-02 NOTE — Telephone Encounter (Signed)
Pt [facility]; so far [facility] does not [facility] rx and pt was seen and got rx on 11/06/16. Pt said due to ins coverage she cannot get filled at local pharmacy. Pt wants to know if new xanax rx could be faxed to [facility]. Pt request cb.

## 2016-12-03 NOTE — Telephone Encounter (Signed)
Please prepare Rx but not sure we can fax to them without some kind of cover sheet, etc

## 2016-12-03 NOTE — Telephone Encounter (Signed)
I am not at a computer where I can print rxs.

## 2016-12-04 MED ORDER — ALPRAZOLAM 0.25 MG PO TABS: 0.2500 mg | ORAL_TABLET | Freq: Every day | ORAL | 0 refills | Status: DC | PRN

## 2016-12-04 NOTE — Telephone Encounter (Signed)
rx printed and waiting for signature tomorrow.

## 2016-12-05 NOTE — Telephone Encounter (Signed)
Spoke to pt. Faxed rx to [facility]

## 2017-01-08 DIAGNOSIS — Z23 Encounter for immunization: Secondary | ICD-10-CM | POA: Diagnosis not present

## 2017-02-27 ENCOUNTER — Other Ambulatory Visit: Payer: Self-pay | Admitting: *Deleted

## 2017-02-27 ENCOUNTER — Telehealth: Payer: Self-pay | Admitting: Internal Medicine

## 2017-02-27 MED ORDER — ATORVASTATIN CALCIUM 20 MG PO TABS: 20.0000 mg | ORAL_TABLET | Freq: Every day | ORAL | 3 refills | Status: DC

## 2017-02-27 MED ORDER — FLUOXETINE HCL 40 MG PO CAPS: 40.0000 mg | ORAL_CAPSULE | Freq: Every day | ORAL | 3 refills | Status: DC

## 2017-02-27 NOTE — Telephone Encounter (Incomplete)
Copied from [place] [phone]. Topic: Quick Communication - Rx Refill/Question >> Feb 27, 2017  1:57 PM Clack, Laban Emperor wrote: Has the patient contacted their pharmacy? yes   (Agent: If no, request that the patient contact the pharmacy for the refill.)   Preferred Pharmacy (with phone number or street name): [place], [place] [phone] (Phone) [phone] (Fax)  Pt is requesting a med refill on her atorvastatin (LIPITOR) 20 MG tablet [[id number]] and FLUoxetine (PROZAC) 40 MG capsule [[id number]]. She states that she has about 10 days left with the medication.   Agent: Please be advised that RX refills may take up to 3 business days. We ask that you follow-up with your pharmacy.

## 2017-02-27 NOTE — Telephone Encounter (Signed)
Copied from [place] [phone]. Topic: Quick Communication - Rx Refill/Question >> Feb 27, 2017  1:57 PM Clack, Laban Emperor wrote: Has the patient contacted their pharmacy? yes  (Agent: If no, request that the patient contact the pharmacy for the refill.)   Preferred Pharmacy (with phone number or street name): [place], [place] [phone] (Phone) [phone] (Fax)  Pt is requesting a med refill on her atorvastatin (LIPITOR) 20 MG tablet [[id number]] and FLUoxetine (PROZAC) 40 MG capsule [[id number]]. She states that she has about 10 days left with the medication.   Agent: Please be advised that RX refills may take up to 3 business days. We ask that you follow-up with your pharmacy.

## 2017-03-03 ENCOUNTER — Other Ambulatory Visit: Payer: Self-pay

## 2017-03-03 MED ORDER — ALPRAZOLAM 0.25 MG PO TABS: 0.2500 mg | ORAL_TABLET | Freq: Every day | ORAL | 0 refills | Status: DC | PRN

## 2017-03-03 NOTE — Telephone Encounter (Signed)
Received fax form from [facility] for Alprazolam 0.25mg  tablet #90 last filled 12-04-16. Last OV 11-06-16 Next OV 07-04-17.  Fax form in Dr Alla German Inbox on his desk to sign so it can be faxed back to [facility].

## 2017-03-03 NOTE — Telephone Encounter (Signed)
Form faxed

## 2017-03-03 NOTE — Telephone Encounter (Signed)
Approved: #90 x 0 Form signed

## 2017-03-24 ENCOUNTER — Telehealth: Payer: Self-pay | Admitting: Internal Medicine

## 2017-03-24 NOTE — Telephone Encounter (Signed)
Almost all sinus infections---especially in the first week--are viral and antibiotics won't help. I don't think she needs to come in now, but I can't phone in an antibiotic. Is she asking for something else?

## 2017-03-24 NOTE — Telephone Encounter (Signed)
Copied from [place] [id number]. Topic: Quick Communication - See Telephone Encounter >> Mar 24, 2017  8:33 AM Ahmed Prima L wrote: CRM for notification. See Telephone encounter for:   03/24/17.  Patient said she has sinus issues since Thursday & a bad cough. She is asking for something to be called in for her. Advised her she needs to be seen (refused to make an appt) Over the counter meds has not helped.  [place], Allen Call back number is [phone]

## 2017-03-24 NOTE — Telephone Encounter (Signed)
I spoke with pt and she is complaining with sinus pressure and h/a across forehead;non prod cough; ? Fever on and off. Pt said she does not feel like coming in for appt.total care.Please advise.

## 2017-03-24 NOTE — Telephone Encounter (Signed)
Spoke to pt. She is going to try Mucinex-D and will call us Thursday if she is not any better.

## 2017-04-10 IMAGING — CT CT ABD-PELV W/ CM
2 of 5 series · 15 of 46 positions shown, 17 images · IV contrast (Omni 300)
Comparison: 11/25/2014

CLINICAL DATA: Nausea, fever and right upper quadrant abdominal
pain for 6 days. History of gastric outlet surgery in June 2014.

EXAM:
CT ABDOMEN AND PELVIS WITH CONTRAST
TECHNIQUE: Multidetector CT imaging of the abdomen and pelvis was performed
using the standard protocol following bolus administration of
intravenous contrast.
CONTRAST:  100mL OMNIPAQUE IOHEXOL 300 MG/ML  SOLN

[Series 2: abd/pelv 5.0 i30f 3 · axial · 0.83mm/px · z∈[+886,+1306]mm · 12 of 96 slices shown, 14 images]
[im 6/96  soft-tissue]
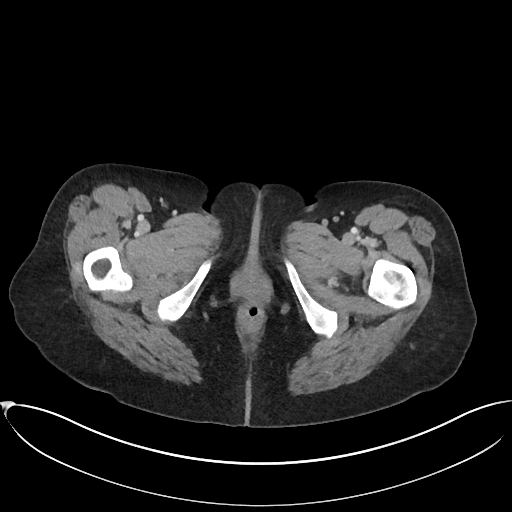
[im 6/96  bone]
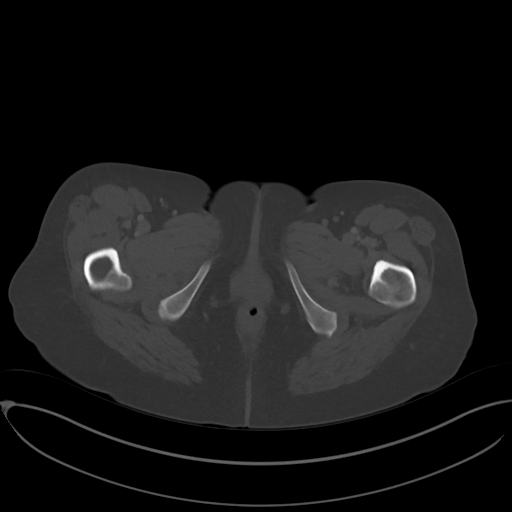
[im 16/96  soft-tissue]
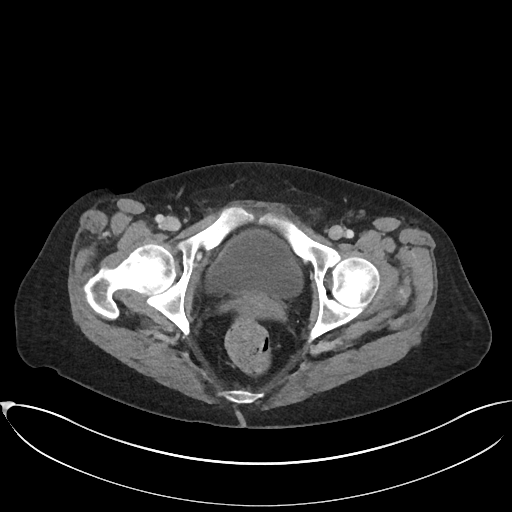
[im 22/96  soft-tissue]
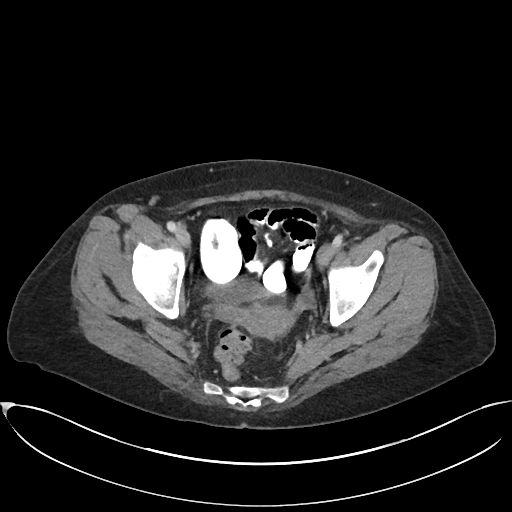
[im 27/96  soft-tissue]
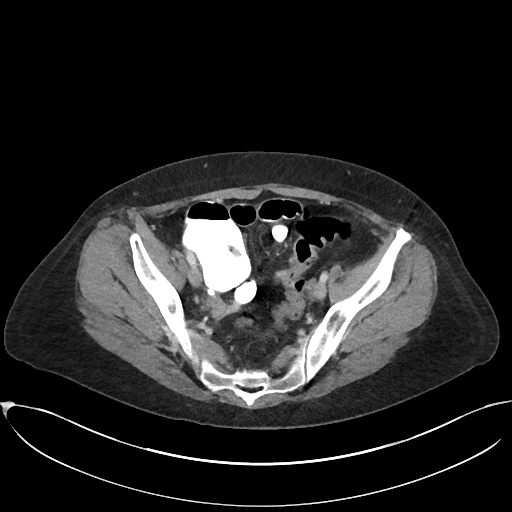
[im 37/96  soft-tissue]
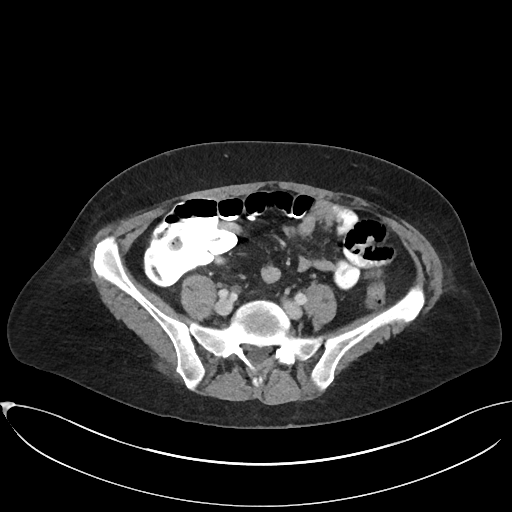
[im 43/96  soft-tissue]
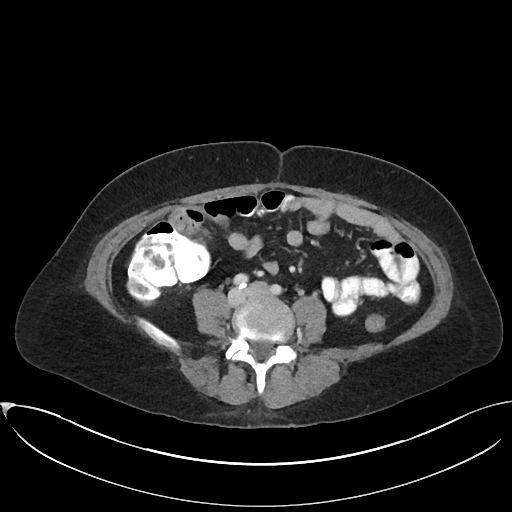
[im 53/96  soft-tissue]
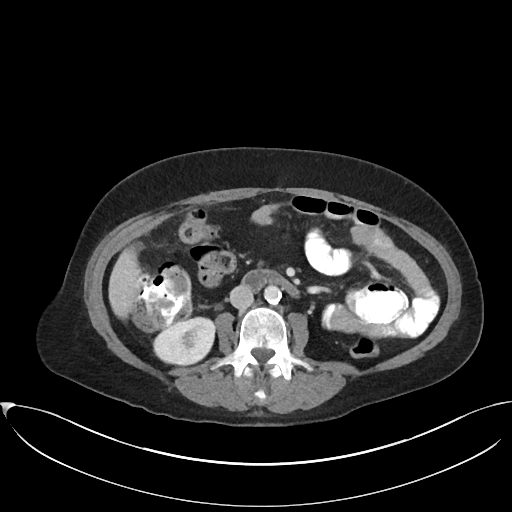
[im 59/96  soft-tissue]
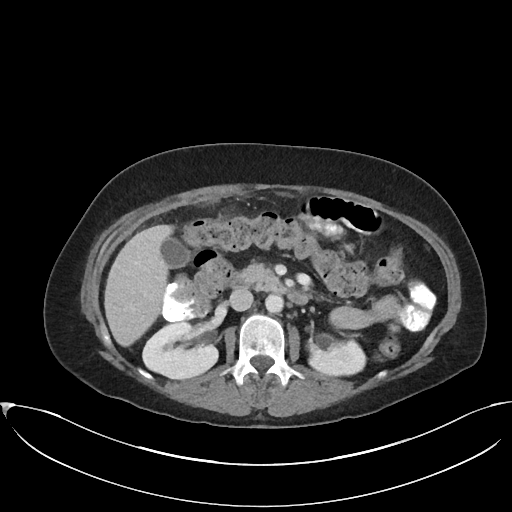
[im 69/96  soft-tissue]
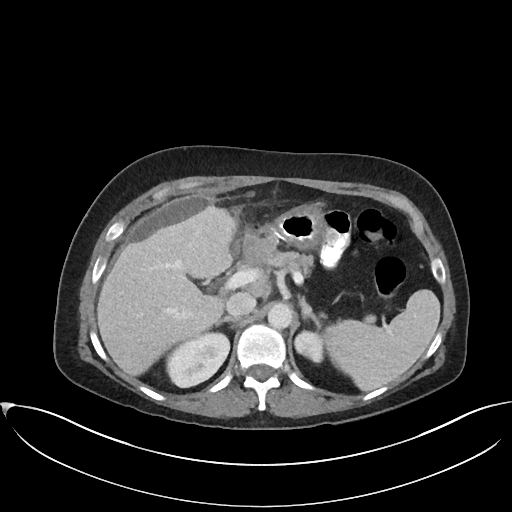
[im 69/96  bone]
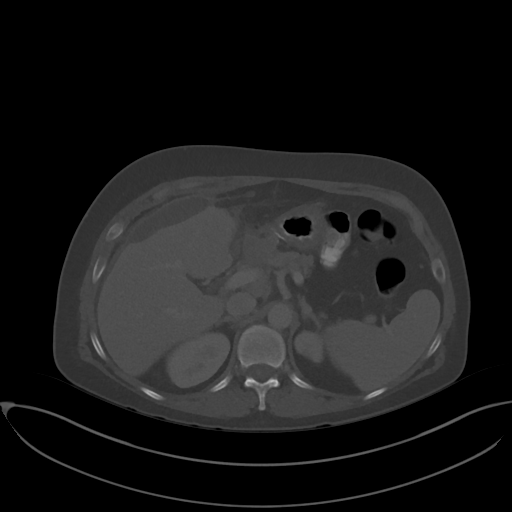
[im 74/96  soft-tissue]
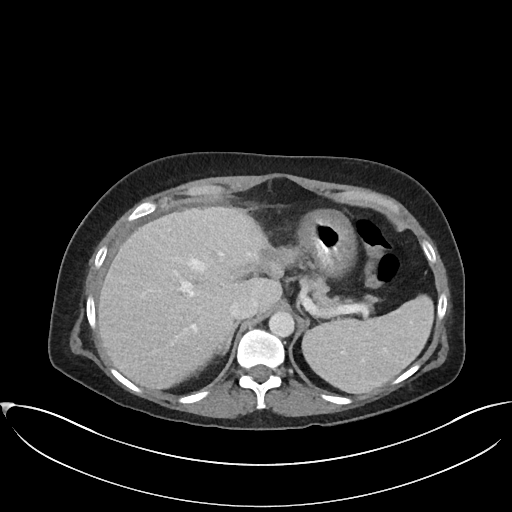
[im 80/96  soft-tissue]
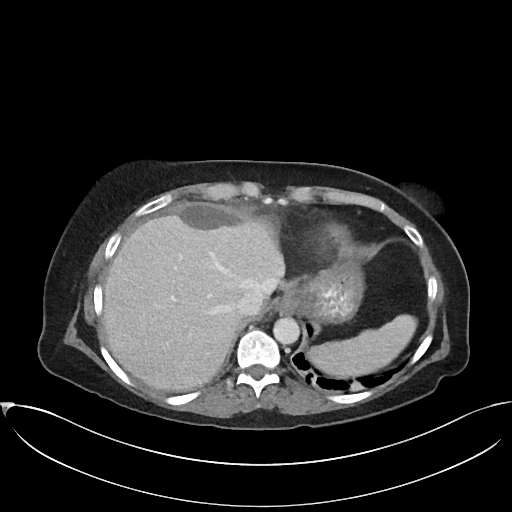
[im 90/96  soft-tissue]
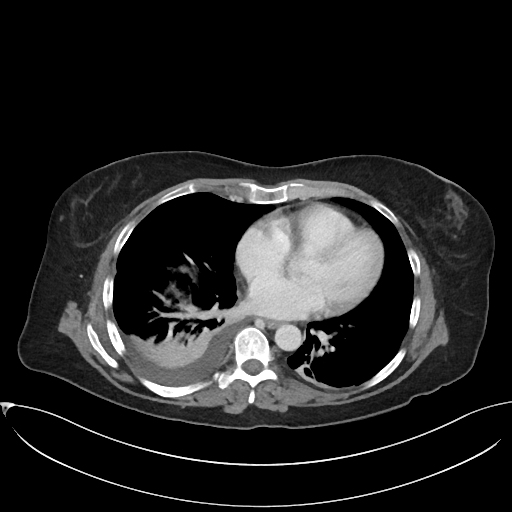

[Series 5: abd/pelv 2.0 cor · coronal · 0.69mm/px · 3 of 131 slices shown]
[im 44/131  soft-tissue]
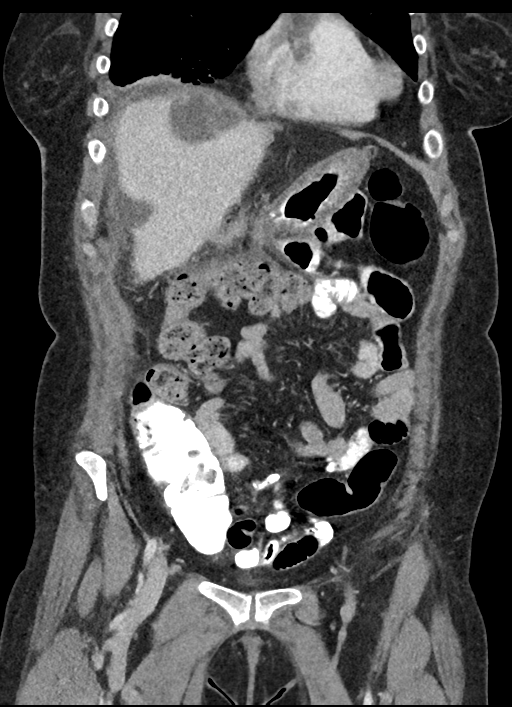
[im 58/131  soft-tissue]
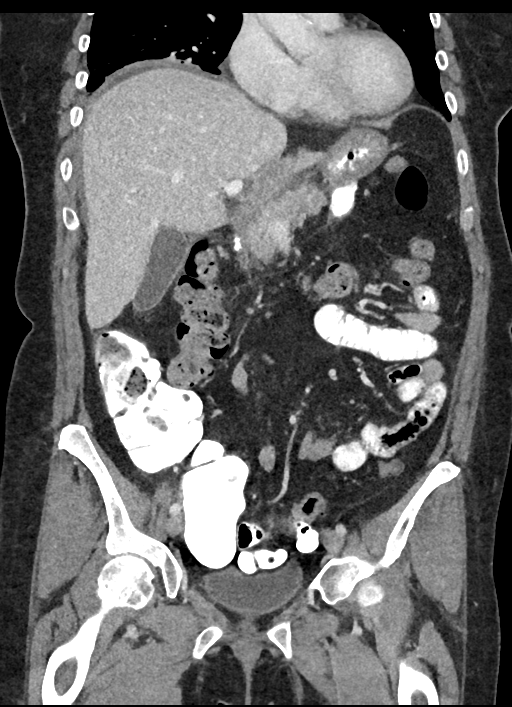
[im 73/131  soft-tissue]
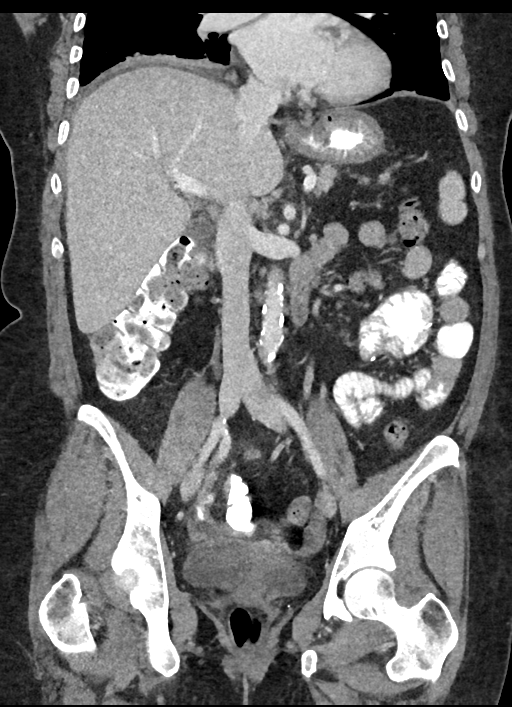

[15 of 46 positions shown; findings below may reference images not displayed]

FINDINGS: Lower chest: There is a new right-sided pleural effusion with
overlying atelectasis. There is also a left basilar atelectasis. The
heart is normal in size. No pericardial effusion. The distal
esophagus is grossly normal.

Hepatobiliary: No focal hepatic lesions or intrahepatic biliary
dilatation. There are this subdiaphragmatic abscesses anterior to
the liver. The upper abscess measures a maximum 5 cm in the lower
abscess measures a maximum of 7 cm. No intrahepatic abscess. There
is inflammatory phlegmon in the right upper quadrant near the
patient's prior gastric bypass surgery. I do not see any definite
leaking oral contrast but I assume is a perforation that is sealed
off. There is fluid between the stomach and the liver.

Pancreas: No mass, inflammation or ductal dilatation.

Spleen: No focal lesions.  Normal size.

Adrenals/Urinary Tract: Stable scarring changes involving the upper
pole region of left kidney. The adrenal glands and right kidney are
normal.

Stomach/Bowel: There is fairly marked wall thickening involving the
residual stomach suggesting gastritis or peptic ulcer disease. A
perforated peptic ulcer is possible but the jejunal loop of small
bowel at the anastomosis appears normal. No findings for small bowel
obstruction. No free air. The colon is unremarkable except for
moderate stool.

Vascular/Lymphatic: Stable aortic calcifications. The branch vessels
are patent. The major venous structures are patent. Probable
inflammatory/hyperplastic right upper quadrant lymph nodes. No
retroperitoneal adenopathy.

Other: The uterus and ovaries are normal. The bladder is normal. No
pelvic mass. There is a small amount of free pelvic fluid. No
inguinal mass or adenopathy.

Musculoskeletal: No significant bony findings.
IMPRESSION: 1. Persistent inflammatory phlegmon involving the space between the
liver and the stomach. Interval development of perihepatic abscesses
as described above. This could be due to anastomotic breakdown along
the hepatic margin of the stomach which has sealed off leak or
perforated peptic ulcer. The stomach wall is markedly thickened. No
obvious leaking oral contrast.
2. New right pleural effusion and overlying atelectasis.

## 2017-04-10 IMAGING — DX DG ABDOMEN 2V
2 series · 2 of 2 positions shown · non-contrast
Comparison: November 30, 2014

CLINICAL DATA: Right upper quadrant pain for 1 week.

EXAM:
ABDOMEN - 2 VIEW

[abdomen erect]
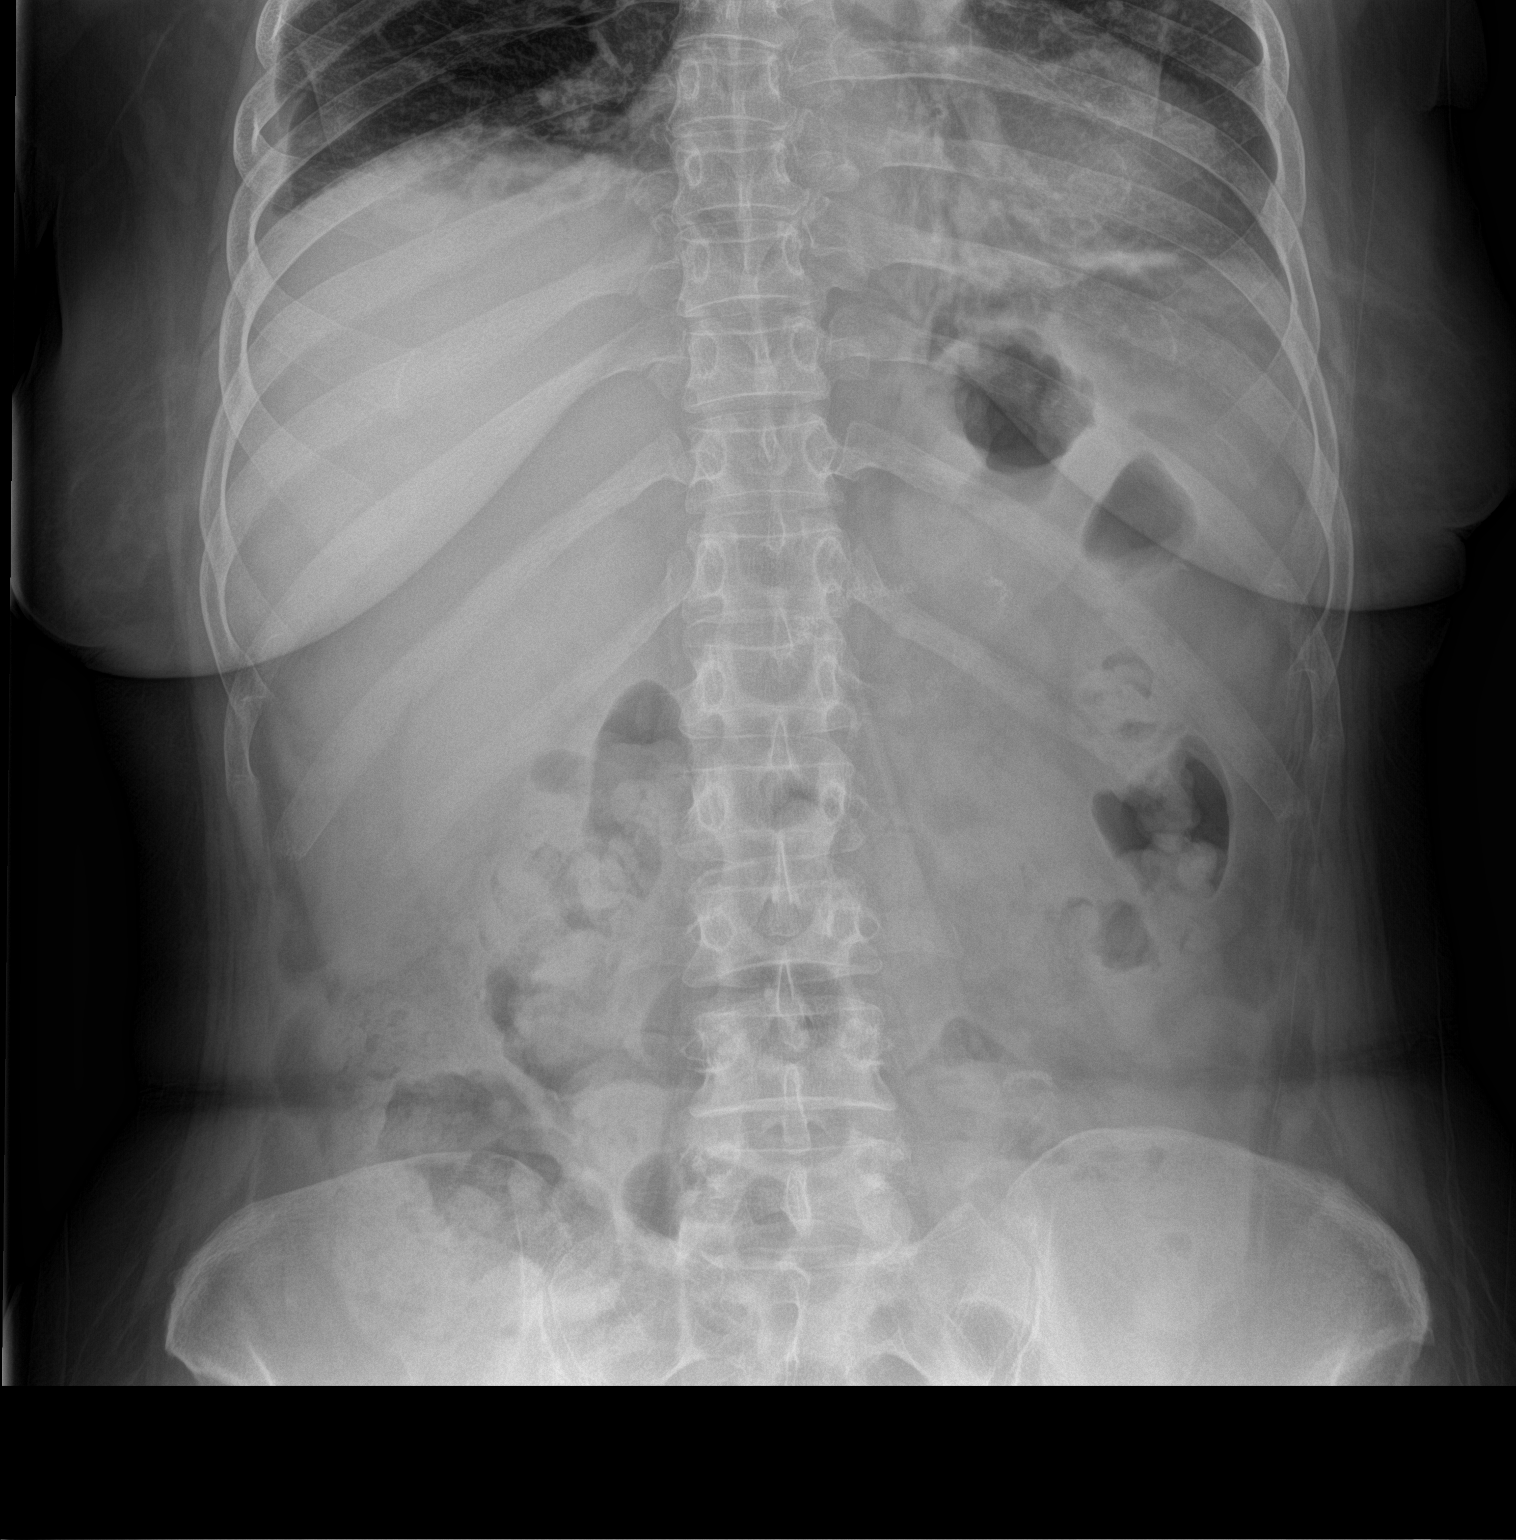

[abdomen supine]
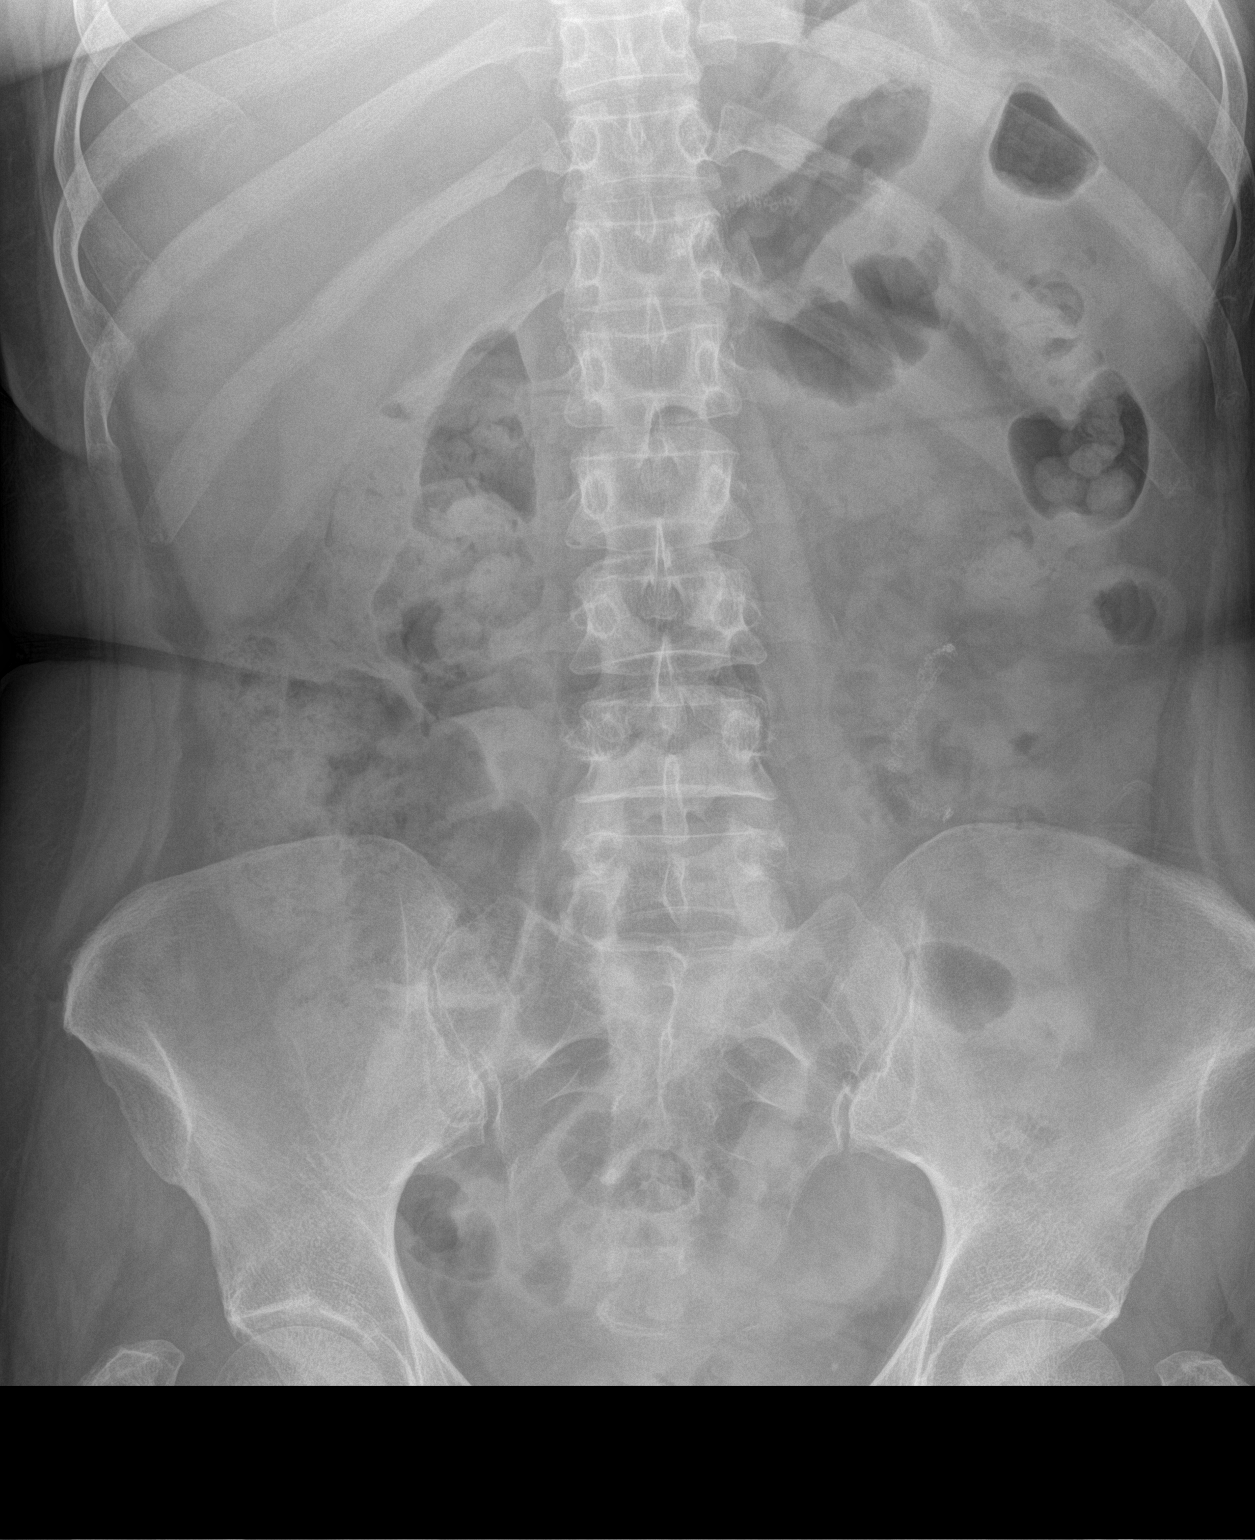

[2 of 2 positions shown; findings below may reference images not displayed]

FINDINGS: The bowel gas pattern is normal. Moderate bowel content is
identified in the colon. There is no evidence of free air. Surgical
sutures identified in the left upper and mid abdomen. No
radio-opaque calculi or other significant radiographic abnormality
is seen. There is mild atelectasis of left lung base.
IMPRESSION: No bowel obstruction or free air. Moderate bowel content identified
throughout colon.

## 2017-05-08 ENCOUNTER — Encounter: Payer: Self-pay | Admitting: Radiology

## 2017-07-04 ENCOUNTER — Encounter: Payer: Self-pay | Admitting: Internal Medicine

## 2017-07-04 ENCOUNTER — Ambulatory Visit (INDEPENDENT_AMBULATORY_CARE_PROVIDER_SITE_OTHER): Payer: BLUE CROSS/BLUE SHIELD | Admitting: Internal Medicine

## 2017-07-04 VITALS — BP 102/68 | HR 68 | Temp 97.5°F | Ht 59.5 in | Wt 144.0 lb

## 2017-07-04 DIAGNOSIS — M7501 Adhesive capsulitis of right shoulder: Secondary | ICD-10-CM | POA: Diagnosis not present

## 2017-07-04 DIAGNOSIS — Z0000 Encounter for general adult medical examination without abnormal findings: Secondary | ICD-10-CM

## 2017-07-04 DIAGNOSIS — M7500 Adhesive capsulitis of unspecified shoulder: Secondary | ICD-10-CM | POA: Insufficient documentation

## 2017-07-04 DIAGNOSIS — E785 Hyperlipidemia, unspecified: Secondary | ICD-10-CM

## 2017-07-04 DIAGNOSIS — F419 Anxiety disorder, unspecified: Secondary | ICD-10-CM

## 2017-07-04 LAB — COMPREHENSIVE METABOLIC PANEL
ALBUMIN: 4 g/dL (ref 3.5–5.2)
ALT: 22 U/L (ref 0–35)
AST: 23 U/L (ref 0–37)
Alkaline Phosphatase: 94 U/L (ref 39–117)
BUN: 8 mg/dL (ref 6–23)
CALCIUM: 9.2 mg/dL (ref 8.4–10.5)
CO2: 29 mEq/L (ref 19–32)
CREATININE: 0.78 mg/dL (ref 0.40–1.20)
Chloride: 104 mEq/L (ref 96–112)
GFR: 80.22 mL/min (ref >60.00)
Glucose, Bld: 92 mg/dL (ref 70–99)
POTASSIUM: 4.8 meq/L (ref 3.5–5.1)
Sodium: 142 mEq/L (ref 135–145)
Total Bilirubin: 0.3 mg/dL (ref 0.2–1.2)
Total Protein: 6.7 g/dL (ref 6.0–8.3)

## 2017-07-04 LAB — LIPID PANEL
CHOL/HDL RATIO: 4
CHOLESTEROL: 158 mg/dL (ref 0–200)
HDL: 43.1 mg/dL (ref >39.00)
NonHDL: 115.35
TRIGLYCERIDES: 226 mg/dL — AB (ref 0.0–149.0)
VLDL: 45.2 mg/dL — AB (ref 0.0–40.0)

## 2017-07-04 LAB — LDL CHOLESTEROL, DIRECT: Direct LDL: 91 mg/dL

## 2017-07-04 LAB — CBC
HCT: 38.9 % (ref 36.0–46.0)
Hemoglobin: 13.2 g/dL (ref 12.0–15.0)
MCHC: 34 g/dL (ref 30.0–36.0)
MCV: 91.5 fl (ref 78.0–100.0)
PLATELETS: 281 10*3/uL (ref 150.0–400.0)
RBC: 4.25 Mil/uL (ref 3.87–5.11)
RDW: 13.3 % (ref 11.5–15.5)
WBC: 6.2 10*3/uL (ref 4.0–10.5)

## 2017-07-04 MED ORDER — FLUOXETINE HCL 40 MG PO CAPS: 40.0000 mg | ORAL_CAPSULE | Freq: Every day | ORAL | 3 refills | Status: DC

## 2017-07-04 MED ORDER — ATORVASTATIN CALCIUM 20 MG PO TABS: 20.0000 mg | ORAL_TABLET | Freq: Every day | ORAL | 3 refills | Status: DC

## 2017-07-04 MED ORDER — ALPRAZOLAM 0.25 MG PO TABS: 0.2500 mg | ORAL_TABLET | Freq: Every day | ORAL | 0 refills | Status: DC | PRN

## 2017-07-04 NOTE — Progress Notes (Signed)
Subjective:    Patient ID: Shelly Hood, female    DOB: January 16, 1956, 62 y.o.   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 acetaminophen (TYLENOL) 500 MG tablet Take 1,000 mg by mouth every 6 (six) hours as needed for mild pain or fever (pain and fever). Reported on 05/17/2015    . ALPRAZolam (XANAX) 0.25 MG tablet Take 1 tablet (0.25 mg total) by mouth daily as needed for anxiety. 90 tablet 0  . atorvastatin (LIPITOR) 20 MG tablet Take 1 tablet (20 mg total) by mouth daily. 90 tablet 3  . FLUoxetine (PROZAC) 40 MG capsule Take 1 capsule (40 mg total) by mouth daily. 90 capsule 3  . Probiotic Product (PROBIOTIC ADVANCED PO) Take by mouth. Reported on 08/18/2015     No current facility-administered medications on file prior to visit.     Allergies  Allergen Reactions  . Cefotetan Other (See Comments)    Thrombocytopenia, plat ct 18K  . Naproxen     ulcers  . Nsaids     DUODENAL ULCER  . Aspirin     Ulcers   . Ibuprofen     ulcers   . Toradol [Ketorolac Tromethamine]     ulcers  . Codeine Sulfate Nausea Only    REACTION: nausea only tolerates hydrocodone  . Flagyl [Metronidazole] Other (See Comments)    Weak, bad taste     Past Medical History:  Diagnosis Date  . Clostridium difficile colitis 11/30/2014  . Clostridium difficile infection   . Depression   . Gastric outlet obstruction   . GERD (gastroesophageal reflux disease)   . High cholesterol   . Obesity   . Panic attacks   .  Peptic ulcer disease 12/02/2014  . Pyloric stenosis   . Pyloric ulcer   . Thrombocytopenia ([place]) 06/26/2014    Past Surgical History:  Procedure Laterality Date  . ANTRECTOMY N/A 06/24/2014   Procedure: ROBOTIC DISTAL GASTRECTOMY;  Surgeon: Michael Boston, MD;  [facility];  Service: General;  Laterality: N/A;  . COLONOSCOPY  AGE 25  . COLONOSCOPY N/A 10/19/2014   Procedure: COLONOSCOPY;  Surgeon: Gatha Mayer, MD;  Location: [place] [facility];  Service: Endoscopy;  Laterality: N/A;  with FMT  . FECAL TRANSPLANT N/A 10/19/2014   Procedure: FECAL TRANSPLANT;  Surgeon: Gatha Mayer, MD;  Location: [facility];  Service: Endoscopy;  Laterality: N/A;  . LAPAROSCOPIC ROUX-EN-Y GASTRIC BYPASS WITH HIATAL HERNIA REPAIR N/A 06/24/2014   Procedure: ROBOTIC ROUX-EN-Y CONSTRUCTION WITH HIATAL HERNIA REPAIR;  Surgeon: Michael Boston, MD;  [facility];  Service: General;  Laterality: N/A;  . 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 RHINOPLASTY     X3  . VAGOTOMY N/A 06/24/2014   Procedure: ROBOTIC TRUNCAL VAGOTOMY ;  Surgeon: Michael Boston, MD;  Location: [facility];  Service: General;  Laterality: N/A;  This is [name]'s preference    Family History  Problem  Relation Age of Onset  . Diabetes Mother   . Diabetes Maternal Grandmother   . Heart disease Father   . Heart disease Brother        Quarry manager  . Diabetes Brother   . Breast cancer Paternal Grandmother   . Colon cancer Neg Hx     Social History   Socioeconomic History  . Marital status: Married    Spouse name: Not on file  . Number of children: 2  . Years of education: Not on file  . Highest education level: Not on file  Occupational History  . 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 Financial resource strain: Not on file  . Food insecurity:    Worry: Not on file    Inability: Not on file  . Transportation needs:    Medical: Not on file      Non-medical: Not on file  Tobacco Use  . Smoking status: Former Smoker    Packs/day: 0.25    Years: 30.00    Pack years: 7.50    Types: Cigarettes    Last attempt to quit: 06/18/2014    Years since quitting: 3.0  . Smokeless tobacco: Never Used  Substance and Sexual Activity  . Alcohol use: No    Alcohol/week: 0.0 oz  . Drug use: No  . Sexual activity: Not Currently    Partners: Male    Birth control/protection: Post-menopausal  Lifestyle  . Physical activity:    Days per week: Not on file    Minutes per session: Not on file  . Stress: Not on file  Relationships  . Social connections:    Talks on phone: Not on file    Gets together: Not on file    Attends religious service: Not on file    Active member of club or organization: Not on file    Attends meetings of clubs or organizations: Not on file    Relationship status: Not on file  . Intimate partner violence:    Fear of current or ex partner: Not on file    Emotionally abused: Not on file    Physically abused: Not on file    Forced sexual activity: Not on file  Other Topics Concern  . Not on file  Social History Narrative  . Not on file   Review of Systems  Constitutional: Negative for fatigue and unexpected weight change.       Wears seat belt  HENT: Negative for dental problem, hearing loss, tinnitus and trouble swallowing.        Keeps up with dentist  Eyes: Negative for visual disturbance.       No diplopia or unilateral vision loss  Gastrointestinal: Negative for abdominal pain, blood in stool and constipation.       No heartburn Still gets fecal urgency---usually makes it in time  Endocrine: Negative for polydipsia and polyuria.  Genitourinary: Negative for difficulty urinating, dyspareunia, dysuria and hematuria.  Musculoskeletal: Negative for back pain and joint swelling.  Skin:       No suspicious lesions---discussed screening exam with derm  Allergic/Immunologic: Negative for environmental  allergies and immunocompromised state.  Neurological: Negative for dizziness, syncope, light-headedness and headaches.  Hematological: Negative for adenopathy. Does not bruise/bleed easily.  Psychiatric/Behavioral: Negative for dysphoric mood and sleep disturbance.       Objective:   Physical Exam  Constitutional: She is oriented to person, place, and time. She appears well-developed and well-nourished.  No distress.  HENT:  Head: Normocephalic and atraumatic.  Right Ear: External ear normal.  Left Ear: External ear normal.  Mouth/Throat: Oropharynx is clear and moist. No oropharyngeal exudate.  Eyes: Pupils are equal, round, and reactive to light. Conjunctivae are normal.  Neck: Normal range of motion. No thyromegaly present.  Cardiovascular: Normal rate, regular rhythm, normal heart sounds and intact distal pulses. Exam reveals no gallop.  No murmur heard. Pulmonary/Chest: Effort normal and breath sounds normal. No respiratory distress. She has no wheezes. She has no rales.  Abdominal: Soft. There is no tenderness.  Musculoskeletal: She exhibits no edema or tenderness.  Right shoulder--- no bursa tenderness, internal/external rotation okay but limited abduction  Lymphadenopathy:    She has no cervical adenopathy.  Neurological: She is alert and oriented to person, place, and time.  Skin: No rash noted. No erythema.  Psychiatric: She has a normal mood and affect. Her behavior is normal.          Assessment & Plan:

## 2017-07-04 NOTE — Patient Instructions (Signed)
Please set up your screening mammogram. 

## 2017-07-04 NOTE — Assessment & Plan Note (Signed)
Doing well with primary prevention with statin

## 2017-07-04 NOTE — Assessment & Plan Note (Signed)
Healthy Discussed fitness Mammogram due now Colon 2026 Will set up with gyn and I recommended derm evaluation (preventative) Yearly flu vaccine

## 2017-07-04 NOTE — Assessment & Plan Note (Signed)
Worsening again over 6 months Will set up for injection

## 2017-07-04 NOTE — Assessment & Plan Note (Signed)
Doing well with the alprazolam every morning

## 2017-07-21 ENCOUNTER — Encounter: Payer: Self-pay | Admitting: Internal Medicine

## 2017-07-21 ENCOUNTER — Ambulatory Visit: Payer: BLUE CROSS/BLUE SHIELD | Admitting: Internal Medicine

## 2017-07-21 VITALS — BP 114/66 | HR 69 | Temp 98.0°F | Ht 60.0 in | Wt 144.0 lb

## 2017-07-21 DIAGNOSIS — M7501 Adhesive capsulitis of right shoulder: Secondary | ICD-10-CM | POA: Diagnosis not present

## 2017-07-21 MED ORDER — METHYLPREDNISOLONE ACETATE 40 MG/ML IJ SUSP
40.0000 mg | Freq: Once | INTRAMUSCULAR | Status: AC
  Administered 2017-07-21: 40 mg via INTRAMUSCULAR

## 2017-07-21 NOTE — Assessment & Plan Note (Signed)
Discussed alternatives Verbal consent for injection  Posterior approach to right shoulder Sterile prep, ethyl chloride then 2cc 1% plain lido 40mg  depomedrol/5cc 1% lido instilled in shoulder Tolerated well Was able to move arm better after injection Discussed home care

## 2017-07-21 NOTE — Addendum Note (Signed)
Addended by: Pilar Grammes on: 07/21/2017 03:35 PM   Modules accepted: Orders

## 2017-07-21 NOTE — Progress Notes (Signed)
Subjective:    Patient ID: Shelly Hood, female    DOB: [birth year], 62 y.o.   MRN: [id number]  HPI Here for injection of right shoulder  Current Outpatient Medications on File Prior to Visit  Medication Sig Dispense Refill  . acetaminophen (TYLENOL) 500 MG tablet Take 1,000 mg by mouth every 6 (six) hours as needed for mild pain or fever (pain and fever). Reported on 05/17/2015    . ALPRAZolam (XANAX) 0.25 MG tablet Take 1 tablet (0.25 mg total) by mouth daily as needed for anxiety. 90 tablet 0  . atorvastatin (LIPITOR) 20 MG tablet Take 1 tablet (20 mg total) by mouth daily. 90 tablet 3  . FLUoxetine (PROZAC) 40 MG capsule Take 1 capsule (40 mg total) by mouth daily. 90 capsule 3  . Probiotic Product (PROBIOTIC ADVANCED PO) Take by mouth. Reported on 08/18/2015     No current facility-administered medications on file prior to visit.     Allergies  Allergen Reactions  . Cefotetan Other (See Comments)    Thrombocytopenia, plat ct 18K  . Naproxen     ulcers  . Nsaids     DUODENAL ULCER  . Aspirin     Ulcers   . Ibuprofen     ulcers   . Toradol [Ketorolac Tromethamine]     ulcers  . Codeine Sulfate Nausea Only    REACTION: nausea only tolerates hydrocodone  . Flagyl [Metronidazole] Other (See Comments)    Weak, bad taste     Past Medical History:  Diagnosis Date  . Clostridium difficile colitis 11/30/2014  . Clostridium difficile infection   . Depression   . Gastric outlet obstruction   . GERD (gastroesophageal reflux disease)   . High cholesterol   . Obesity   . Panic attacks   . Peptic ulcer disease 12/02/2014  . Pyloric stenosis   . Pyloric ulcer   . Thrombocytopenia ([place]) 06/26/2014    Past Surgical History:  Procedure Laterality Date  . ANTRECTOMY N/A 06/24/2014   Procedure: ROBOTIC DISTAL GASTRECTOMY;  Surgeon: Michael Boston, MD;  [facility];  Service: General;  Laterality: N/A;  . COLONOSCOPY  AGE 51  . COLONOSCOPY N/A 10/19/2014   Procedure:  COLONOSCOPY;  Surgeon: Gatha Mayer, MD;  Location: [facility];  Service: Endoscopy;  Laterality: N/A;  with FMT  . FECAL TRANSPLANT N/A 10/19/2014   Procedure: FECAL TRANSPLANT;  Surgeon: Gatha Mayer, MD;  Location: [facility];  Service: Endoscopy;  Laterality: N/A;  . LAPAROSCOPIC ROUX-EN-Y GASTRIC BYPASS WITH HIATAL HERNIA REPAIR N/A 06/24/2014   Procedure: ROBOTIC ROUX-EN-Y CONSTRUCTION WITH HIATAL HERNIA REPAIR;  Surgeon: Michael Boston, MD;  [facility];  Service: General;  Laterality: N/A;  . 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 RHINOPLASTY     X3  . VAGOTOMY N/A 06/24/2014   Procedure: ROBOTIC TRUNCAL VAGOTOMY ;  Surgeon: Michael Boston, MD;  [facility];  Service: General;  Laterality: N/A;  This is [name]'s preference    Family History  Problem Relation Age of Onset  . Diabetes Mother   . Diabetes Maternal Grandmother   . Heart disease Father   . Heart disease Brother        Quarry manager  . Diabetes Brother   . Breast cancer Paternal Grandmother   . Colon cancer Neg Hx     Social History   Socioeconomic History  . Marital status: Married    Spouse name:  Not on file  . Number of children: 2  . Years of education: Not on file  . Highest education level: Not on file  Occupational History  . 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 Financial resource strain: Not on file  . Food insecurity:    Worry: Not on file    Inability: Not on file  . Transportation needs:    Medical: Not on file    Non-medical: Not on file  Tobacco Use  . Smoking status: Former Smoker    Packs/day: 0.25    Years: 30.00    Pack years: 7.50    Types: Cigarettes    Last attempt to quit: 06/18/2014    Years since quitting: 3.0  . Smokeless tobacco: Never Used  Substance and Sexual Activity  . Alcohol use: No    Alcohol/week: 0.0 oz  . Drug use: No  . Sexual activity: Not Currently     Partners: Male    Birth control/protection: Post-menopausal  Lifestyle  . Physical activity:    Days per week: Not on file    Minutes per session: Not on file  . Stress: Not on file  Relationships  . Social connections:    Talks on phone: Not on file    Gets together: Not on file    Attends religious service: Not on file    Active member of club or organization: Not on file    Attends meetings of clubs or organizations: Not on file    Relationship status: Not on file  . Intimate partner violence:    Fear of current or ex partner: Not on file    Emotionally abused: Not on file    Physically abused: Not on file    Forced sexual activity: Not on file  Other Topics Concern  . Not on file  Social History Narrative  . Not on file   Review of Systems     Objective:   Physical Exam        Assessment & Plan:

## 2017-08-08 ENCOUNTER — Encounter: Payer: Self-pay | Admitting: Obstetrics & Gynecology

## 2017-08-08 ENCOUNTER — Ambulatory Visit (INDEPENDENT_AMBULATORY_CARE_PROVIDER_SITE_OTHER): Payer: BLUE CROSS/BLUE SHIELD | Admitting: Obstetrics & Gynecology

## 2017-08-08 VITALS — BP 107/70 | HR 67 | Ht 59.0 in | Wt 142.0 lb

## 2017-08-08 DIAGNOSIS — Z1151 Encounter for screening for human papillomavirus (HPV): Secondary | ICD-10-CM

## 2017-08-08 DIAGNOSIS — Z01419 Encounter for gynecological examination (general) (routine) without abnormal findings: Secondary | ICD-10-CM | POA: Diagnosis not present

## 2017-08-08 DIAGNOSIS — Z124 Encounter for screening for malignant neoplasm of cervix: Secondary | ICD-10-CM

## 2017-08-08 NOTE — Progress Notes (Signed)
Subjective:    Shelly Hood is a 61 y.o.married P2 (2 grands) female who presents for an annual exam. The patient has no complaints today. The patient is not currently sexually active. GYN screening history: last pap: was normal. The patient wears seatbelts: yes. The patient participates in regular exercise: yes. Has the patient ever been transfused or tattooed?: no. The patient reports that there is not domestic violence in her life.   Menstrual History: OB History    Gravida  2   Para      Term      Preterm      AB      Living  2     SAB      TAB      Ectopic      Multiple      Live Births  2           Menarche age: 62 No LMP recorded. Patient is postmenopausal.    The following portions of the patient's history were reviewed and updated as appropriate: allergies, current medications, past family history, past medical history, past social history, past surgical history and problem list.  Review of Systems Pertinent items are noted in HPI.    Objective:    BP 107/70   Pulse 67   Ht 4\' 11"  (1.499 m)   Wt 142 lb (64.4 kg)   BMI 28.68 kg/m   General Appearance:    Alert, cooperative, no distress, appears stated age  Head:    Normocephalic, without obvious abnormality, atraumatic  Eyes:    PERRL, conjunctiva/corneas clear, EOM's intact, fundi    benign, both eyes  Ears:    Normal TM's and external ear canals, both ears  Nose:   Nares normal, septum midline, mucosa normal, no drainage    or sinus tenderness  Throat:   Lips, mucosa, and tongue normal; teeth and gums normal  Neck:   Supple, symmetrical, trachea midline, no adenopathy;    thyroid:  no enlargement/tenderness/nodules; no carotid   bruit or JVD  Back:     Symmetric, no curvature, ROM normal, no CVA tenderness  Lungs:     Clear to auscultation bilaterally, respirations unlabored  Chest Wall:    No tenderness or deformity   Heart:    Regular rate and rhythm, S1 and S2 normal, no murmur, rub   or  gallop  Breast Exam:    No tenderness, masses, or nipple abnormality  Abdomen:     Soft, non-tender, bowel sounds active all four quadrants,    no masses, no organomegaly  Genitalia:    Normal female without lesion, discharge or tenderness, due to her history of VIN 3, I applied acetic acid to her vulva. No white areas were seen, cervix atrophic, no discharge     Extremities:   Extremities normal, atraumatic, no cyanosis or edema  Pulses:   2+ and symmetric all extremities  Skin:   Skin color, texture, turgor normal, no rashes or lesions  Lymph nodes:   Cervical, supraclavicular, and axillary nodes normal  Neurologic:   CNII-XII intact, normal strength, sensation and reflexes    throughout  .    Assessment:    Healthy female exam.    Plan:     Mammogram. Thin prep Pap smear. with cotesting

## 2017-08-13 LAB — CYTOLOGY - PAP
Diagnosis: NEGATIVE
HPV: DETECTED — AB

## 2017-09-02 ENCOUNTER — Ambulatory Visit
Admission: RE | Admit: 2017-09-02 | Discharge: 2017-09-02 | Disposition: A | Discharge status: Home / Self Care | Payer: BLUE CROSS/BLUE SHIELD | Source: Ambulatory Visit | Attending: Obstetrics & Gynecology | Admitting: Obstetrics & Gynecology

## 2017-09-02 DIAGNOSIS — Z01419 Encounter for gynecological examination (general) (routine) without abnormal findings: Secondary | ICD-10-CM | POA: Insufficient documentation

## 2017-09-02 DIAGNOSIS — Z1231 Encounter for screening mammogram for malignant neoplasm of breast: Secondary | ICD-10-CM | POA: Diagnosis not present

## 2017-12-18 DIAGNOSIS — Z23 Encounter for immunization: Secondary | ICD-10-CM | POA: Diagnosis not present

## 2018-01-06 ENCOUNTER — Ambulatory Visit: Payer: BLUE CROSS/BLUE SHIELD | Admitting: Internal Medicine

## 2018-01-06 ENCOUNTER — Encounter: Payer: Self-pay | Admitting: Internal Medicine

## 2018-01-06 ENCOUNTER — Telehealth: Payer: Self-pay

## 2018-01-06 VITALS — BP 118/70 | HR 62 | Temp 98.2°F | Ht 59.0 in | Wt 144.0 lb

## 2018-01-06 DIAGNOSIS — S46911A Strain of unspecified muscle, fascia and tendon at shoulder and upper arm level, right arm, initial encounter: Secondary | ICD-10-CM | POA: Insufficient documentation

## 2018-01-06 NOTE — Telephone Encounter (Signed)
Copied from [place] [phone]. Topic: General - Other >> Jan 05, 2018 11:30 AM Yvette Rack wrote: Reason for CRM: Pt states she is experiencing a lot of pain in her shoulder and there is a 2:30 pm appt available at another clinic and she needs the last shoulder injection documents printed out to take to the clinic today. Pt requests copy of the 07/21/17 visit with Dr. Silvio Pate that documents the shoulder injection. Cb# [phone]

## 2018-01-06 NOTE — Progress Notes (Signed)
Subjective:    Patient ID: Shelly Hood, female    DOB: Jul 22, 1955, 62 y.o.   MRN: [id number]  HPI Here due to recurrent shoulder problems  Noted significant improvement after the cortisone shot in May Cleaned some closets and attic, etc this weekend Now pain is back again Reduced ROM again  Tried tylenol and heating pad Had someone try Reiki Tried advil-- 600mg  twice yesterday (didn't help)  Current Outpatient Medications on File Prior to Visit  Medication Sig Dispense Refill  . acetaminophen (TYLENOL) 500 MG tablet Take 1,000 mg by mouth every 6 (six) hours as needed for mild pain or fever (pain and fever). Reported on 05/17/2015    . ALPRAZolam (XANAX) 0.25 MG tablet Take 1 tablet (0.25 mg total) by mouth daily as needed for anxiety. 90 tablet 0  . atorvastatin (LIPITOR) 20 MG tablet Take 1 tablet (20 mg total) by mouth daily. 90 tablet 3  . FLUoxetine (PROZAC) 40 MG capsule Take 1 capsule (40 mg total) by mouth daily. 90 capsule 3  . Probiotic Product (PROBIOTIC ADVANCED PO) Take by mouth. Reported on 08/18/2015     No current facility-administered medications on file prior to visit.     Allergies  Allergen Reactions  . Cefotetan Other (See Comments)    Thrombocytopenia, plat ct 18K  . Naproxen     ulcers  . Nsaids     DUODENAL ULCER  . Aspirin     Ulcers   . Ibuprofen     ulcers   . Toradol [Ketorolac Tromethamine]     ulcers  . Codeine Sulfate Nausea Only    REACTION: nausea only tolerates hydrocodone  . Flagyl [Metronidazole] Other (See Comments)    Weak, bad taste     Past Medical History:  Diagnosis Date  . Clostridium difficile colitis 11/30/2014  . Clostridium difficile infection   . Depression   . Gastric outlet obstruction   . GERD (gastroesophageal reflux disease)   . High cholesterol   . Obesity   . Panic attacks   . Peptic ulcer disease 12/02/2014  . Pyloric stenosis   . Pyloric ulcer   . Thrombocytopenia ([place]) 06/26/2014    Past  Surgical History:  Procedure Laterality Date  . ANTRECTOMY N/A 06/24/2014   Procedure: ROBOTIC DISTAL GASTRECTOMY;  Surgeon: Michael Boston, MD;  Location: [facility];  Service: General;  Laterality: N/A;  . COLONOSCOPY  AGE 54  . COLONOSCOPY N/A 10/19/2014   Procedure: COLONOSCOPY;  Surgeon: Gatha Mayer, MD;  Location: [facility];  Service: Endoscopy;  Laterality: N/A;  with FMT  . FECAL TRANSPLANT N/A 10/19/2014   Procedure: FECAL TRANSPLANT;  Surgeon: Gatha Mayer, MD;  Location: [place];  Service: Endoscopy;  Laterality: N/A;  . LAPAROSCOPIC ROUX-EN-Y GASTRIC BYPASS WITH HIATAL HERNIA REPAIR N/A 06/24/2014   Procedure: ROBOTIC ROUX-EN-Y CONSTRUCTION WITH HIATAL HERNIA REPAIR;  Surgeon: Michael Boston, MD;  Location: [facility];  Service: General;  Laterality: N/A;  . 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 RHINOPLASTY     X3  . VAGOTOMY N/A 06/24/2014   Procedure: ROBOTIC TRUNCAL VAGOTOMY ;  Surgeon: Michael Boston, MD;  Location: [facility];  Service: General;  Laterality: N/A;  This is [name]'s preference    Family History  Problem Relation Age of Onset  . Diabetes Mother   . Diabetes Maternal Grandmother   . Heart disease Father   . Heart disease Brother  pacer/defibrillator  . Diabetes Brother   . Breast cancer Paternal Grandmother   . Colon cancer Neg Hx     Social History   Socioeconomic History  . Marital status: Married    Spouse name: Not on file  . Number of children: 2  . Years of education: Not on file  . Highest education level: Not on file  Occupational History  . 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 Financial resource strain: Not on file  . Food insecurity:    Worry: Not on file    Inability: Not on file  . Transportation needs:    Medical: Not on file    Non-medical: Not on file  Tobacco Use  . Smoking status: Former Smoker    Packs/day: 0.25    Years: 30.00     Pack years: 7.50    Types: Cigarettes    Last attempt to quit: 06/18/2014    Years since quitting: 3.5  . Smokeless tobacco: Never Used  Substance and Sexual Activity  . Alcohol use: No    Alcohol/week: 0.0 standard drinks  . Drug use: No  . Sexual activity: Not Currently    Partners: Male    Birth control/protection: Post-menopausal  Lifestyle  . Physical activity:    Days per week: Not on file    Minutes per session: Not on file  . Stress: Not on file  Relationships  . Social connections:    Talks on phone: Not on file    Gets together: Not on file    Attends religious service: Not on file    Active member of club or organization: Not on file    Attends meetings of clubs or organizations: Not on file    Relationship status: Not on file  . Intimate partner violence:    Fear of current or ex partner: Not on file    Emotionally abused: Not on file    Physically abused: Not on file    Forced sexual activity: Not on file  Other Topics Concern  . Not on file  Social History Narrative  . Not on file   Review of Systems  No fever or illness No other joint problems     Objective:   Physical Exam  Musculoskeletal:  No right shoulder swelling Normal active abduction but pain when elbow is flexed and holds shoulder anteriorly No crepitus Fairly normal passive ROM in all spheres No bursa tenderness           Assessment & Plan:

## 2018-01-06 NOTE — Assessment & Plan Note (Signed)
Doesn't seem to be the same thing as before Seems to have overdone it--with muscular strain Nothing to suggest rotator cuff tear though Discussed ice, can try NSAIDs Consider ortho if worsens

## 2018-01-06 NOTE — Telephone Encounter (Signed)
This was was sent to me when I was out of the office for lunch. Then we had a meeting, and then I had to leave for a few hours. I am not sure if she received the information she needed.

## 2018-01-09 ENCOUNTER — Ambulatory Visit: Payer: BLUE CROSS/BLUE SHIELD | Admitting: Internal Medicine

## 2018-01-09 ENCOUNTER — Encounter

## 2018-02-20 ENCOUNTER — Other Ambulatory Visit: Payer: Self-pay | Admitting: Internal Medicine

## 2018-03-16 ENCOUNTER — Other Ambulatory Visit: Payer: Self-pay

## 2018-03-16 MED ORDER — ALPRAZOLAM 0.25 MG PO TABS: 0.2500 mg | ORAL_TABLET | Freq: Every day | ORAL | 0 refills | Status: DC | PRN

## 2018-03-16 NOTE — Telephone Encounter (Signed)
Name of Medication: xanax 0.25 mg Name of [facility] Last Fill or Written Date and Quantity: # 21 on 07/04/17 Last Office Visit and Type: 07/04/17 annual & 01/06/18 acute visit Next Office Visit and Type:07/07/18 CPX

## 2018-04-13 ENCOUNTER — Telehealth: Payer: Self-pay | Admitting: Internal Medicine

## 2018-04-13 DIAGNOSIS — M25511 Pain in right shoulder: Secondary | ICD-10-CM

## 2018-04-13 NOTE — Telephone Encounter (Signed)
Referral placed.

## 2018-04-13 NOTE — Addendum Note (Signed)
Addended by: Viviana Simpler I on: 04/13/2018 02:25 PM   Modules accepted: Orders

## 2018-04-13 NOTE — Telephone Encounter (Signed)
Pt called office in regards to being seen on 10/29. Pt stated Dr.Letvak told her that if the pain worsened he would put in a referral to ortho. Pt is wanting to move forward with the referral. Advised pt that Dr.Letvak is out of the office today.

## 2018-04-29 DIAGNOSIS — M5412 Radiculopathy, cervical region: Secondary | ICD-10-CM | POA: Diagnosis not present

## 2018-04-29 DIAGNOSIS — M25511 Pain in right shoulder: Secondary | ICD-10-CM | POA: Diagnosis not present

## 2018-05-19 ENCOUNTER — Encounter: Payer: Self-pay | Admitting: Radiology

## 2018-06-12 ENCOUNTER — Other Ambulatory Visit: Payer: Self-pay | Admitting: Internal Medicine

## 2018-06-12 MED ORDER — ALPRAZOLAM 0.25 MG PO TABS: 0.2500 mg | ORAL_TABLET | Freq: Every day | ORAL | 0 refills | Status: DC | PRN

## 2018-06-12 NOTE — Telephone Encounter (Signed)
Pt requesting refill [facility]

## 2018-06-12 NOTE — Telephone Encounter (Signed)
Name of Medication: alprazolam 0.25 mg Name of [facility]acy[facility]  Last Fill or Written Date and Quantity: # 90 on 03/16/18 Last Office Visit and Type: 01/06/18 shoulder pain & 07/04/17 annual exam Next Office Visit and Type: 07/07/18 CPX

## 2018-07-07 ENCOUNTER — Ambulatory Visit (INDEPENDENT_AMBULATORY_CARE_PROVIDER_SITE_OTHER): Payer: BLUE CROSS/BLUE SHIELD | Admitting: Internal Medicine

## 2018-07-07 ENCOUNTER — Encounter: Payer: Self-pay | Admitting: Internal Medicine

## 2018-07-07 VITALS — BP 108/69 | HR 68 | Temp 96.3°F | Ht 59.0 in | Wt 141.0 lb

## 2018-07-07 DIAGNOSIS — E785 Hyperlipidemia, unspecified: Secondary | ICD-10-CM

## 2018-07-07 DIAGNOSIS — Z0000 Encounter for general adult medical examination without abnormal findings: Secondary | ICD-10-CM | POA: Diagnosis not present

## 2018-07-07 DIAGNOSIS — F419 Anxiety disorder, unspecified: Secondary | ICD-10-CM

## 2018-07-07 MED ORDER — FLUOXETINE HCL 40 MG PO CAPS: 40.0000 mg | ORAL_CAPSULE | Freq: Every day | ORAL | 3 refills | Status: DC

## 2018-07-07 MED ORDER — ATORVASTATIN CALCIUM 20 MG PO TABS: 20.0000 mg | ORAL_TABLET | Freq: Every day | ORAL | 3 refills | Status: DC

## 2018-07-07 NOTE — Assessment & Plan Note (Signed)
Still fine with statin for primary prevention Will defer labs till next year

## 2018-07-07 NOTE — Assessment & Plan Note (Signed)
Some increased stress with COVID Doing okay overall with the fluoxetine and prn xanax (usually takes it in the morning)

## 2018-07-07 NOTE — Progress Notes (Signed)
Subjective:    Patient ID: Shelly Hood, female    DOB: 12/29/1955, 63 y.o.   MRN: [id number]  HPI Virtual visit for annual exam Identification done Reviewed billing and she gave consent She is in her home and I am in my office  Overall doing okay Anxiety is up with the [place]  Does mostly residential property management Sleeps well Is being careful with eating healthy Lots of walking on the job---some yard work also  Still on the cholesterol med No side effects with this  Episodic bouts of diarrhea still No sig pain Not necessarily after eating--but did have a bad night after cheeseburger and fries No blood  Current Outpatient Medications on File Prior to Visit  Medication Sig Dispense Refill  . acetaminophen (TYLENOL) 500 MG tablet Take 1,000 mg by mouth every 6 (six) hours as needed for mild pain or fever (pain and fever). Reported on 05/17/2015    . ALPRAZolam (XANAX) 0.25 MG tablet Take 1 tablet (0.25 mg total) by mouth daily as needed for anxiety. 90 tablet 0  . atorvastatin (LIPITOR) 20 MG tablet TAKE 1 TABLET DAILY 90 tablet 3  . FLUoxetine (PROZAC) 40 MG capsule TAKE 1 CAPSULE DAILY 90 capsule 3  . Probiotic Product (PROBIOTIC ADVANCED PO) Take by mouth. Reported on 08/18/2015     No current facility-administered medications on file prior to visit.     Allergies  Allergen Reactions  . Cefotetan Other (See Comments)    Thrombocytopenia, plat ct 18K  . Naproxen     ulcers  . Nsaids     DUODENAL ULCER  . Aspirin     Ulcers   . Ibuprofen     ulcers   . Toradol [Ketorolac Tromethamine]     ulcers  . Codeine Sulfate Nausea Only    REACTION: nausea only tolerates hydrocodone  . Flagyl [Metronidazole] Other (See Comments)    Weak, bad taste     Past Medical History:  Diagnosis Date  . Clostridium difficile colitis 11/30/2014  . Clostridium difficile infection   . Depression   . Gastric outlet obstruction   . GERD (gastroesophageal reflux disease)   .  High cholesterol   . Obesity   . Panic attacks   . Peptic ulcer disease 12/02/2014  . Pyloric stenosis   . Pyloric ulcer   . Thrombocytopenia ([place]) 06/26/2014    Past Surgical History:  Procedure Laterality Date  . ANTRECTOMY N/A 06/24/2014   Procedure: ROBOTIC DISTAL GASTRECTOMY;  Surgeon: Michael Boston, MD;  Location: [facility];  Service: General;  Laterality: N/A;  . COLONOSCOPY  AGE 23  . COLONOSCOPY N/A 10/19/2014   Procedure: COLONOSCOPY;  Surgeon: Gatha Mayer, MD;  Location: [place];  Service: Endoscopy;  Laterality: N/A;  with FMT  . FECAL TRANSPLANT N/A 10/19/2014   Procedure: FECAL TRANSPLANT;  Surgeon: Gatha Mayer, MD;  Location: [place];  Service: Endoscopy;  Laterality: N/A;  . LAPAROSCOPIC ROUX-EN-Y GASTRIC BYPASS WITH HIATAL HERNIA REPAIR N/A 06/24/2014   Procedure: ROBOTIC ROUX-EN-Y CONSTRUCTION WITH HIATAL HERNIA REPAIR;  Surgeon: Michael Boston, MD;  Location[facility];  Service: General;  Laterality: N/A;  . 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 RHINOPLASTY     X3  . VAGOTOMY N/A 06/24/2014   Procedure: ROBOTIC TRUNCAL VAGOTOMY ;  Surgeon: Michael Boston, MD;  Location: [facility];  Service: General;  Laterality: N/A;  This is [name]'s preference  Family History  Problem Relation Age of Onset  . Diabetes Mother   . Diabetes Maternal Grandmother   . Heart disease Father   . Heart disease Brother        Quarry manager  . Diabetes Brother   . Breast cancer Paternal Grandmother   . Colon cancer Neg Hx     Social History   Socioeconomic History  . Marital status: Married    Spouse name: Not on file  . Number of children: 2  . Years of education: Not on file  . Highest education level: Not on file  Occupational History  . 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 Financial resource strain: Not on file  . Food insecurity:    Worry: Not on file    Inability: Not on  file  . Transportation needs:    Medical: Not on file    Non-medical: Not on file  Tobacco Use  . Smoking status: Former Smoker    Packs/day: 0.25    Years: 30.00    Pack years: 7.50    Types: Cigarettes    Last attempt to quit: 06/18/2014    Years since quitting: 4.0  . Smokeless tobacco: Never Used  Substance and Sexual Activity  . Alcohol use: No    Alcohol/week: 0.0 standard drinks  . Drug use: No  . Sexual activity: Not Currently    Partners: Male    Birth control/protection: Post-menopausal  Lifestyle  . Physical activity:    Days per week: Not on file    Minutes per session: Not on file  . Stress: Not on file  Relationships  . Social connections:    Talks on phone: Not on file    Gets together: Not on file    Attends religious service: Not on file    Active member of club or organization: Not on file    Attends meetings of clubs or organizations: Not on file    Relationship status: Not on file  . Intimate partner violence:    Fear of current or ex partner: Not on file    Emotionally abused: Not on file    Physically abused: Not on file    Forced sexual activity: Not on file  Other Topics Concern  . Not on file  Social History Narrative  . Not on file   Review of Systems  Constitutional: Negative for fatigue and unexpected weight change.       Wears seat belt  HENT: Negative for dental problem, hearing loss and tinnitus.        Keeps up with dentist  Eyes: Negative for visual disturbance.       No diplopia or unilateral vision loss  Respiratory: Negative for cough, chest tightness and shortness of breath.   Cardiovascular: Negative for chest pain, palpitations and leg swelling.  Gastrointestinal: Positive for diarrhea. Negative for abdominal pain and blood in stool.       No regular heartburn---rare indigestion better with OTC med  Endocrine: Negative for polydipsia and polyuria.  Genitourinary: Negative for difficulty urinating, dyspareunia, dysuria and  hematuria.  Musculoskeletal: Negative for arthralgias, back pain and joint swelling.  Skin: Negative for rash.  Allergic/Immunologic: Negative for environmental allergies and immunocompromised state.  Neurological: Negative for dizziness, syncope, light-headedness and headaches.  Hematological: Negative for adenopathy. Does not bruise/bleed easily.  Psychiatric/Behavioral: Negative for dysphoric mood and sleep disturbance. The patient is nervous/anxious.  Objective:   Physical Exam  Constitutional: She appears well-developed. No distress.  Respiratory: Effort normal. No respiratory distress.  Psychiatric: She has a normal mood and affect. Her behavior is normal.           Assessment & Plan:

## 2018-07-07 NOTE — Assessment & Plan Note (Signed)
Healthy Discussed fitness and healthy eating Mammogram last year--can do it next year again Pap smear last year---per Dr Hulan Fray Colon due 2026 Consider hep C screening and shingrix next year

## 2018-09-21 ENCOUNTER — Other Ambulatory Visit: Payer: Self-pay

## 2018-09-21 MED ORDER — ALPRAZOLAM 0.25 MG PO TABS: 0.2500 mg | ORAL_TABLET | Freq: Every day | ORAL | 0 refills | Status: DC | PRN

## 2018-09-21 NOTE — Telephone Encounter (Signed)
Name of Medication: xanax 0.25 mg [facility] Last Fill or Written Date and Quantity: # 81 on 06/12/18 Last Office Visit and Type:07/07/18 annual  Next Office Visit and Type: 07/09/2019 CPX

## 2018-12-16 ENCOUNTER — Other Ambulatory Visit: Payer: Self-pay

## 2018-12-16 NOTE — Telephone Encounter (Signed)
Name of Medication: alprazolam 0.25 mg [facility] Last Fill or Written Date and Quantity:# 90 on 09/21/18  Last Office Visit and Type: 07/07/18 annual Next Office Visit and Type: 07/09/2019 annual

## 2018-12-17 MED ORDER — ALPRAZOLAM 0.25 MG PO TABS: 0.2500 mg | ORAL_TABLET | Freq: Every day | ORAL | 0 refills | Status: DC | PRN

## 2019-02-15 ENCOUNTER — Other Ambulatory Visit: Payer: Self-pay | Admitting: Internal Medicine

## 2019-03-25 ENCOUNTER — Other Ambulatory Visit: Payer: Self-pay | Admitting: Internal Medicine

## 2019-03-25 NOTE — Telephone Encounter (Signed)
Pt left a voicemail requesting a refill of her Xanax.  Last OV 07/07/2018 Last refilled 12/17/2018 #90 x 0 refills  Please advise, thanks.

## 2019-03-26 MED ORDER — ALPRAZOLAM 0.25 MG PO TABS: 0.2500 mg | ORAL_TABLET | Freq: Every day | ORAL | 0 refills | Status: DC | PRN

## 2019-03-26 NOTE — Addendum Note (Signed)
Addended by: Kris Mouton on: 03/26/2019 04:51 PM   Modules accepted: Orders

## 2019-03-26 NOTE — Telephone Encounter (Signed)
Patient called back asking to have Xanax refilled to [facility] instead. I called [facility] and cancelled RX that was sent there.

## 2019-03-28 MED ORDER — ALPRAZOLAM 0.25 MG PO TABS: 0.2500 mg | ORAL_TABLET | Freq: Every day | ORAL | 0 refills | Status: DC | PRN

## 2019-03-28 NOTE — Addendum Note (Signed)
Addended by: Viviana Simpler I on: 03/28/2019 01:39 PM   Modules accepted: Orders

## 2019-05-17 ENCOUNTER — Telehealth: Payer: Self-pay | Admitting: *Deleted

## 2019-05-17 NOTE — Telephone Encounter (Signed)
Patient called stating that she started with some covid symptoms Friday night. Patient stated that she went to a [facility] yesterday and tested positive for covid. Patent stated that she is having body aches, low grade fever and stuffy nose. Patient was offered a virtual visit which she declined stating that she does not feel that she needs one at this point. Patient was advised to drink plenty of fluids, rest and eat well balanced meals. Patient was advised if she changes her mind and decides that she needs a virtual visit to to call the office back. Patient was given ER precautions and she verbalized understanding. Patient stated that she just called to office because she was advised to do that by the clinic that did her test.

## 2019-05-17 NOTE — Telephone Encounter (Signed)
Please check on her in the next day or so to make sure she is doing okay

## 2019-05-20 NOTE — Telephone Encounter (Signed)
Spoke to pt. She said she is doing housework right now. Only issue has been a stuffy nose and some body aches.

## 2019-06-30 ENCOUNTER — Other Ambulatory Visit: Payer: Self-pay

## 2019-06-30 NOTE — Telephone Encounter (Signed)
Pt requesting refill for xanax. Name of Medication: Xanax 0.25 mg [facility] Last Fill or Written Date and Quantity:# 22 0n 03/28/19  Last Office Visit and Type: 07/07/18 annual Next Office Visit and Type:07/09/19  CPX

## 2019-07-01 MED ORDER — ALPRAZOLAM 0.25 MG PO TABS: 0.2500 mg | ORAL_TABLET | Freq: Every day | ORAL | 0 refills | Status: DC | PRN

## 2019-07-09 ENCOUNTER — Encounter: Payer: Self-pay | Admitting: Internal Medicine

## 2019-07-09 ENCOUNTER — Other Ambulatory Visit: Payer: Self-pay

## 2019-07-09 ENCOUNTER — Ambulatory Visit (INDEPENDENT_AMBULATORY_CARE_PROVIDER_SITE_OTHER): Payer: BC Managed Care – PPO | Admitting: Internal Medicine

## 2019-07-09 VITALS — BP 116/68 | HR 77 | Temp 97.3°F | Ht 59.5 in | Wt 142.0 lb

## 2019-07-09 DIAGNOSIS — Z23 Encounter for immunization: Secondary | ICD-10-CM

## 2019-07-09 DIAGNOSIS — Z1159 Encounter for screening for other viral diseases: Secondary | ICD-10-CM | POA: Diagnosis not present

## 2019-07-09 DIAGNOSIS — Z0000 Encounter for general adult medical examination without abnormal findings: Secondary | ICD-10-CM

## 2019-07-09 DIAGNOSIS — F411 Generalized anxiety disorder: Secondary | ICD-10-CM

## 2019-07-09 DIAGNOSIS — L820 Inflamed seborrheic keratosis: Secondary | ICD-10-CM

## 2019-07-09 DIAGNOSIS — E785 Hyperlipidemia, unspecified: Secondary | ICD-10-CM

## 2019-07-09 LAB — COMPREHENSIVE METABOLIC PANEL
ALT: 21 U/L (ref 0–35)
AST: 23 U/L (ref 0–37)
Albumin: 4.2 g/dL (ref 3.5–5.2)
Alkaline Phosphatase: 97 U/L (ref 39–117)
BUN: 14 mg/dL (ref 6–23)
CO2: 26 mEq/L (ref 19–32)
Calcium: 9.2 mg/dL (ref 8.4–10.5)
Chloride: 106 mEq/L (ref 96–112)
Creatinine, Ser: 0.85 mg/dL (ref 0.40–1.20)
GFR: 67.89 mL/min (ref >60.00)
Glucose, Bld: 101 mg/dL — ABNORMAL HIGH (ref 70–99)
Potassium: 4.7 mEq/L (ref 3.5–5.1)
Sodium: 139 mEq/L (ref 135–145)
Total Bilirubin: 0.3 mg/dL (ref 0.2–1.2)
Total Protein: 7.1 g/dL (ref 6.0–8.3)

## 2019-07-09 LAB — CBC
HCT: 39.3 % (ref 36.0–46.0)
Hemoglobin: 12.7 g/dL (ref 12.0–15.0)
MCHC: 32.4 g/dL (ref 30.0–36.0)
MCV: 86 fl (ref 78.0–100.0)
Platelets: 291 10*3/uL (ref 150.0–400.0)
RBC: 4.57 Mil/uL (ref 3.87–5.11)
RDW: 15.1 % (ref 11.5–15.5)
WBC: 7.5 10*3/uL (ref 4.0–10.5)

## 2019-07-09 LAB — LIPID PANEL
Cholesterol: 178 mg/dL (ref 0–200)
HDL: 45.9 mg/dL (ref >39.00)
LDL Cholesterol: 102 mg/dL — ABNORMAL HIGH (ref 0–99)
NonHDL: 132.42
Total CHOL/HDL Ratio: 4
Triglycerides: 153 mg/dL — ABNORMAL HIGH (ref 0.0–149.0)
VLDL: 30.6 mg/dL (ref 0.0–40.0)

## 2019-07-09 MED ORDER — FLUOXETINE HCL 40 MG PO CAPS: 40.0000 mg | ORAL_CAPSULE | Freq: Every day | ORAL | 3 refills | Status: DC

## 2019-07-09 MED ORDER — ATORVASTATIN CALCIUM 20 MG PO TABS: 20.0000 mg | ORAL_TABLET | Freq: Every day | ORAL | 3 refills | Status: DC

## 2019-07-09 NOTE — Assessment & Plan Note (Signed)
Healthy Job stress now but otherwise okay Due for mammogram--she can set up Will see gyn later this year Colon due 2026 Td booster today shingrix Flu vaccine in the fall

## 2019-07-09 NOTE — Assessment & Plan Note (Signed)
More situational stress now Continue current meds

## 2019-07-09 NOTE — Progress Notes (Signed)
Subjective:    Patient ID: Shelly Hood, female    DOB: 02-22-1956, 64 y.o.   MRN: [id number]  HPI Here for physical This visit occurred during the SARS-CoV-2 public health emergency.  Safety protocols were in place, including screening questions prior to the visit, additional usage of staff PPE, and extensive cleaning of exam room while observing appropriate contact time as indicated for disinfecting solutions.   Had [place] in March Over this completely--wasn't that bad Discussed that I would still recommend the vaccine  Has irritated lesion on left thigh she wants sprayed  Anxiety is "out the roof" Related to work--management issues Still takes xanax once a day--and the fluoxetine Some depression in the past month--looking for a new job  Still on statin  Current Outpatient Medications on File Prior to Visit  Medication Sig Dispense Refill  . acetaminophen (TYLENOL) 500 MG tablet Take 1,000 mg by mouth every 6 (six) hours as needed for mild pain or fever (pain and fever). Reported on 05/17/2015    . ALPRAZolam (XANAX) 0.25 MG tablet Take 1 tablet (0.25 mg total) by mouth daily as needed for anxiety. 90 tablet 0  . atorvastatin (LIPITOR) 20 MG tablet TAKE 1 TABLET DAILY 90 tablet 3  . FLUoxetine (PROZAC) 40 MG capsule TAKE 1 CAPSULE DAILY 90 capsule 3  . Probiotic Product (PROBIOTIC ADVANCED PO) Take by mouth. Reported on 08/18/2015     No current facility-administered medications on file prior to visit.    Allergies  Allergen Reactions  . Cefotetan Other (See Comments)    Thrombocytopenia, plat ct 18K  . Naproxen     ulcers  . Nsaids     DUODENAL ULCER  . Aspirin     Ulcers   . Ibuprofen     ulcers   . Toradol [Ketorolac Tromethamine]     ulcers  . Codeine Sulfate Nausea Only    REACTION: nausea only tolerates hydrocodone  . Flagyl [Metronidazole] Other (See Comments)    Weak, bad taste     Past Medical History:  Diagnosis Date  . Clostridium difficile  colitis 11/30/2014  . Clostridium difficile infection   . Depression   . Gastric outlet obstruction   . GERD (gastroesophageal reflux disease)   . High cholesterol   . Obesity   . Panic attacks   . Peptic ulcer disease 12/02/2014  . Pyloric stenosis   . Pyloric ulcer   . Thrombocytopenia ([place]) 06/26/2014    Past Surgical History:  Procedure Laterality Date  . ANTRECTOMY N/A 06/24/2014   Procedure: ROBOTIC DISTAL GASTRECTOMY;  Surgeon: Michael Boston, MD;  [facility];  Service: General;  Laterality: N/A;  . COLONOSCOPY  AGE 53  . COLONOSCOPY N/A 10/19/2014   Procedure: COLONOSCOPY;  Surgeon: Gatha Mayer, MD;  Location: [place];  Service: Endoscopy;  Laterality: N/A;  with FMT  . FECAL TRANSPLANT N/A 10/19/2014   Procedure: FECAL TRANSPLANT;  Surgeon: Gatha Mayer, MD;  Location: [place];  Service: Endoscopy;  Laterality: N/A;  . LAPAROSCOPIC ROUX-EN-Y GASTRIC BYPASS WITH HIATAL HERNIA REPAIR N/A 06/24/2014   Procedure: ROBOTIC ROUX-EN-Y CONSTRUCTION WITH HIATAL HERNIA REPAIR;  Surgeon: Michael Boston, MD;  [facility];  Service: General;  Laterality: N/A;  . 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 RHINOPLASTY     X3  . VAGOTOMY N/A 06/24/2014   Procedure: ROBOTIC TRUNCAL VAGOTOMY ;  Surgeon: Michael Boston, MD;  [facility];  Service: General;  Laterality: N/A;  This is [name]'s preference    Family History  Problem Relation Age of Onset  . Diabetes Mother   . Diabetes Maternal Grandmother   . Heart disease Father   . Heart disease Brother        Quarry manager  . Diabetes Brother   . Breast cancer Paternal Grandmother   . Colon cancer Neg Hx     Social History   Socioeconomic History  . Marital status: Married    Spouse name: Not on file  . Number of children: 2  . Years of education: Not on file  . Highest education level: Not on file  Occupational History  . 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 Smoking status: Former Smoker    Packs/day: 0.25    Years: 30.00    Pack years: 7.50    Types: Cigarettes    Quit date: 06/18/2014    Years since quitting: 5.0  . Smokeless tobacco: Never Used  Substance and Sexual Activity  . Alcohol use: No    Alcohol/week: 0.0 standard drinks  . Drug use: No  . Sexual activity: Not Currently    Partners: Male    Birth control/protection: Post-menopausal  Other Topics Concern  . Not on file  Social History Narrative  . Not on file   Social Determinants of Health   Financial Resource Strain:   . Difficulty of Paying Living Expenses:   Food Insecurity:   . Worried About Charity fundraiser in the Last Year:   . Arboriculturist in the Last Year:   Transportation Needs:   . Film/video editor (Medical):   Marland Kitchen Lack of Transportation (Non-Medical):   Physical Activity:   . Days of Exercise per Week:   . Minutes of Exercise per Session:   Stress:   . Feeling of Stress :   Social Connections:   . Frequency of Communication with Friends and Family:   . Frequency of Social Gatherings with Friends and Family:   . Attends Religious Services:   . Active Member of Clubs or Organizations:   . Attends Archivist Meetings:   Marland Kitchen Marital Status:   Intimate Partner Violence:   . Fear of Current or Ex-Partner:   . Emotionally Abused:   Marland Kitchen Physically Abused:   . Sexually Abused:    Review of Systems  Constitutional: Negative for fatigue and unexpected weight change.       Walks regularly Wears seat belt  HENT: Negative for dental problem, hearing loss, tinnitus and trouble swallowing.        Keeps up with dentist  Eyes: Negative for visual disturbance.       No diplopia or unilateral vision loss  Respiratory: Negative for cough, chest tightness and shortness of breath.   Cardiovascular: Negative for chest pain, palpitations and leg swelling.  Gastrointestinal: Negative for abdominal pain  and blood in stool.       Occ indigestion--feels in throat (tums helps)  Endocrine: Negative for polydipsia and polyuria.  Genitourinary: Negative for dyspareunia, dysuria and hematuria.  Musculoskeletal: Negative for arthralgias, back pain and joint swelling.  Skin: Negative for rash.  Allergic/Immunologic: Negative for environmental allergies and immunocompromised state.  Neurological: Negative for dizziness, syncope, light-headedness and headaches.       Gets shaky at times---?hypoglycemic (orange juice helps)  Hematological: Negative for adenopathy. Bruises/bleeds easily.  Psychiatric/Behavioral: Positive for dysphoric mood and  sleep disturbance. The patient is nervous/anxious.        Objective:   Physical Exam  Constitutional: She is oriented to person, place, and time. She appears well-developed. No distress.  HENT:  Head: Normocephalic and atraumatic.  Right Ear: External ear normal.  Left Ear: External ear normal.  Mouth/Throat: Oropharynx is clear and moist. No oropharyngeal exudate.  Upper plate  Eyes: Pupils are equal, round, and reactive to light. Conjunctivae are normal.  Neck: No thyromegaly present.  Cardiovascular: Normal rate, regular rhythm, normal heart sounds and intact distal pulses. Exam reveals no gallop.  No murmur heard. Respiratory: Effort normal and breath sounds normal. No respiratory distress. She has no wheezes. She has no rales.  GI: Soft. There is no abdominal tenderness.  Musculoskeletal:        General: No tenderness or edema.  Lymphadenopathy:    She has no cervical adenopathy.  Neurological: She is alert and oriented to person, place, and time.  Skin: No rash noted.  Psychiatric: She has a normal mood and affect. Her behavior is normal.           Assessment & Plan:

## 2019-07-09 NOTE — Addendum Note (Signed)
Addended by: Pilar Grammes on: 07/09/2019 09:48 AM   Modules accepted: Orders

## 2019-07-09 NOTE — Assessment & Plan Note (Signed)
Discussed options Verbal consent Liquid nitrogen 30 seconds x 2  Tolerated well Discussed home care

## 2019-07-09 NOTE — Patient Instructions (Signed)
Please set up your screening mammogram. 

## 2019-07-09 NOTE — Assessment & Plan Note (Signed)
No problems with primary prevention 

## 2019-07-12 LAB — HEPATITIS C ANTIBODY
Hepatitis C Ab: NONREACTIVE
SIGNAL TO CUT-OFF: 0.01 (ref <1.00)

## 2019-08-17 DIAGNOSIS — Z6828 Body mass index (BMI) 28.0-28.9, adult: Secondary | ICD-10-CM | POA: Diagnosis not present

## 2019-08-17 DIAGNOSIS — Z87891 Personal history of nicotine dependence: Secondary | ICD-10-CM | POA: Diagnosis not present

## 2019-08-17 DIAGNOSIS — K30 Functional dyspepsia: Secondary | ICD-10-CM | POA: Diagnosis not present

## 2019-08-17 DIAGNOSIS — K429 Umbilical hernia without obstruction or gangrene: Secondary | ICD-10-CM | POA: Diagnosis not present

## 2019-08-17 DIAGNOSIS — Z452 Encounter for adjustment and management of vascular access device: Secondary | ICD-10-CM | POA: Diagnosis not present

## 2019-08-17 DIAGNOSIS — R112 Nausea with vomiting, unspecified: Secondary | ICD-10-CM | POA: Diagnosis not present

## 2019-08-17 DIAGNOSIS — R1032 Left lower quadrant pain: Secondary | ICD-10-CM | POA: Diagnosis not present

## 2019-08-17 DIAGNOSIS — R933 Abnormal findings on diagnostic imaging of other parts of digestive tract: Secondary | ICD-10-CM | POA: Diagnosis not present

## 2019-08-17 DIAGNOSIS — Z9884 Bariatric surgery status: Secondary | ICD-10-CM | POA: Diagnosis not present

## 2019-08-17 DIAGNOSIS — N281 Cyst of kidney, acquired: Secondary | ICD-10-CM | POA: Diagnosis not present

## 2019-08-17 DIAGNOSIS — R1110 Vomiting, unspecified: Secondary | ICD-10-CM | POA: Diagnosis not present

## 2019-08-17 DIAGNOSIS — F329 Major depressive disorder, single episode, unspecified: Secondary | ICD-10-CM | POA: Diagnosis not present

## 2019-08-17 DIAGNOSIS — F17210 Nicotine dependence, cigarettes, uncomplicated: Secondary | ICD-10-CM | POA: Diagnosis not present

## 2019-08-17 DIAGNOSIS — Z20822 Contact with and (suspected) exposure to covid-19: Secondary | ICD-10-CM | POA: Diagnosis not present

## 2019-08-17 DIAGNOSIS — R110 Nausea: Secondary | ICD-10-CM | POA: Diagnosis not present

## 2019-08-17 DIAGNOSIS — K529 Noninfective gastroenteritis and colitis, unspecified: Secondary | ICD-10-CM | POA: Diagnosis not present

## 2019-08-17 DIAGNOSIS — F410 Panic disorder [episodic paroxysmal anxiety] without agoraphobia: Secondary | ICD-10-CM | POA: Diagnosis not present

## 2019-08-17 DIAGNOSIS — Z978 Presence of other specified devices: Secondary | ICD-10-CM | POA: Diagnosis not present

## 2019-08-17 DIAGNOSIS — E785 Hyperlipidemia, unspecified: Secondary | ICD-10-CM | POA: Diagnosis not present

## 2019-08-17 DIAGNOSIS — F419 Anxiety disorder, unspecified: Secondary | ICD-10-CM | POA: Diagnosis not present

## 2019-08-17 DIAGNOSIS — K56699 Other intestinal obstruction unspecified as to partial versus complete obstruction: Secondary | ICD-10-CM | POA: Diagnosis not present

## 2019-08-17 DIAGNOSIS — K279 Peptic ulcer, site unspecified, unspecified as acute or chronic, without hemorrhage or perforation: Secondary | ICD-10-CM | POA: Diagnosis not present

## 2019-08-17 DIAGNOSIS — K219 Gastro-esophageal reflux disease without esophagitis: Secondary | ICD-10-CM | POA: Diagnosis not present

## 2019-08-17 DIAGNOSIS — Z4682 Encounter for fitting and adjustment of non-vascular catheter: Secondary | ICD-10-CM | POA: Diagnosis not present

## 2019-08-17 DIAGNOSIS — R109 Unspecified abdominal pain: Secondary | ICD-10-CM | POA: Diagnosis not present

## 2019-08-17 DIAGNOSIS — K56609 Unspecified intestinal obstruction, unspecified as to partial versus complete obstruction: Secondary | ICD-10-CM | POA: Diagnosis not present

## 2019-08-23 ENCOUNTER — Telehealth: Payer: Self-pay

## 2019-08-23 NOTE — Telephone Encounter (Signed)
Left message for pt on home VM to see how she was doing after recent hospital stay. Dr Silvio Pate says she only needs OV if she feels she needs one. Being followed by [facility].  Advised her our phones are out and she could send me a MyChart message with an update if she wouldn't mind.

## 2019-09-01 ENCOUNTER — Ambulatory Visit: Payer: BLUE CROSS/BLUE SHIELD | Admitting: Advanced Practice Midwife

## 2019-09-21 ENCOUNTER — Other Ambulatory Visit: Payer: Self-pay | Admitting: *Deleted

## 2019-09-21 MED ORDER — ALPRAZOLAM 0.25 MG PO TABS: 0.2500 mg | ORAL_TABLET | Freq: Every day | ORAL | 0 refills | Status: DC | PRN

## 2019-09-21 NOTE — Telephone Encounter (Signed)
Patient called stating that she needs a refill for a 90 day supply of her Xanax sent to her mail order pharmacy [facility]. Last refill 07/01/19 #90 Last office visit 07/09/19

## 2019-10-19 ENCOUNTER — Ambulatory Visit (LOCAL_COMMUNITY_HEALTH_CENTER): Payer: BC Managed Care – PPO

## 2019-10-19 ENCOUNTER — Other Ambulatory Visit: Payer: Self-pay

## 2019-10-19 DIAGNOSIS — Z111 Encounter for screening for respiratory tuberculosis: Secondary | ICD-10-CM

## 2019-10-22 ENCOUNTER — Other Ambulatory Visit: Payer: Self-pay

## 2019-10-22 ENCOUNTER — Ambulatory Visit (LOCAL_COMMUNITY_HEALTH_CENTER): Payer: BC Managed Care – PPO

## 2019-10-22 DIAGNOSIS — Z111 Encounter for screening for respiratory tuberculosis: Secondary | ICD-10-CM

## 2019-10-22 LAB — TB SKIN TEST
Induration: 11 mm
TB Skin Test: NEGATIVE

## 2019-10-22 NOTE — Progress Notes (Signed)
PPDR Negative at 11 mm. PPD needed for new school job. No risk factors identified. Denies symptoms. Has never had a positive PPD. Josie Saunders, RN

## 2019-12-08 ENCOUNTER — Other Ambulatory Visit: Payer: Self-pay

## 2019-12-08 ENCOUNTER — Ambulatory Visit (INDEPENDENT_AMBULATORY_CARE_PROVIDER_SITE_OTHER): Payer: BC Managed Care – PPO

## 2019-12-08 DIAGNOSIS — Z23 Encounter for immunization: Secondary | ICD-10-CM | POA: Diagnosis not present

## 2019-12-08 NOTE — Progress Notes (Signed)
Per orders of [facility], in Dr. Alla German absence, 2nd injection of Shingrix, given by Loreen Freud. Patient tolerated injection well.

## 2019-12-14 ENCOUNTER — Other Ambulatory Visit: Payer: Self-pay

## 2019-12-14 MED ORDER — ALPRAZOLAM 0.25 MG PO TABS: 0.2500 mg | ORAL_TABLET | Freq: Every day | ORAL | 0 refills | Status: DC | PRN

## 2019-12-14 NOTE — Telephone Encounter (Signed)
Last written 09-21-19 #90 Last OV 07-14-20 Next OV 07-09-19 [facility]

## 2020-03-28 ENCOUNTER — Other Ambulatory Visit: Payer: Self-pay | Admitting: *Deleted

## 2020-03-28 NOTE — Telephone Encounter (Signed)
Last written 12-14-19 #90 Last OV 07-14-20 Next OV 07-09-19 [facility]

## 2020-03-28 NOTE — Telephone Encounter (Signed)
Patient left a voicemail requesting a refill on her Xanax. Patient stated that she has to call because the pharmacy will not request it for her.

## 2020-03-29 MED ORDER — ALPRAZOLAM 0.25 MG PO TABS: 0.2500 mg | ORAL_TABLET | Freq: Every day | ORAL | 0 refills | Status: DC | PRN

## 2020-05-10 ENCOUNTER — Ambulatory Visit: Payer: BC Managed Care – PPO | Admitting: Family Medicine

## 2020-05-10 ENCOUNTER — Encounter: Payer: Self-pay | Admitting: Family Medicine

## 2020-05-10 ENCOUNTER — Other Ambulatory Visit: Payer: Self-pay

## 2020-05-10 VITALS — BP 112/68 | HR 74 | Temp 98.2°F | Ht 59.5 in | Wt 145.8 lb

## 2020-05-10 DIAGNOSIS — M65312 Trigger thumb, left thumb: Secondary | ICD-10-CM

## 2020-05-10 MED ORDER — TRIAMCINOLONE ACETONIDE 40 MG/ML IJ SUSP
20.0000 mg | Freq: Once | INTRAMUSCULAR | Status: AC
  Administered 2020-05-10: 20 mg via INTRA_ARTICULAR

## 2020-05-10 NOTE — Addendum Note (Signed)
Addended by: Carter Kitten on: 05/10/2020 03:59 PM   Modules accepted: Orders

## 2020-05-10 NOTE — Progress Notes (Signed)
    Ronn Smolinsky T. Reyanne Hussar, MD, [place] at [facility] [place], [address]  Phone: [phone]  FAX: [phone]  Shelly Hood - 65 y.o. female  MRN [id number]  Date of Birth: Aug 07, 1955  Date: 05/10/2020  PCP: Venia Carbon, MD  Referral: Venia Carbon, MD  Chief Complaint  Patient presents with  . Trigger Finger    Left Thumb    This visit occurred during the SARS-CoV-2 public health emergency.  Safety protocols were in place, including screening questions prior to the visit, additional usage of staff PPE, and extensive cleaning of exam room while observing appropriate contact time as indicated for disinfecting solutions.   Subjective:   NATALE BARBA is a 65 y.o. very pleasant female patient with Body mass index is 28.95 kg/m. who presents with the following:  Procedure only:  Tendon Sheath Injection Procedure Note EMERI ESTILL 06-09-1955 Date of procedure: 05/10/2020  Procedure: Tendon Sheath Injection for Trigger Finger, L 1st Indications: Pain  Procedure Details Verbal consent was obtained. Risks (including potential risk for skin lightening and potential atrophy), benefits and alternatives were discussed. Prepped with Chloraprep and Ethyl Chloride used for anesthesia. Under sterile conditions, patient injected at palmar crease aiming distally with 45 degree angle towards nodule; injected directly into tendon sheath. Medication flowed freely without resistance.  Needle size: 22 gauge 1 1/2 inch Injection: 1/2 cc of Lidocaine 1% and Kenalog 20 mg Medication: 1/2 cc of Kenalog 40 mg (equaling Kenalog 20 mg)   Signed,  Luis Nickles T. Alistar Mcenery, MD

## 2020-05-23 ENCOUNTER — Encounter: Payer: Self-pay | Admitting: Internal Medicine

## 2020-05-23 ENCOUNTER — Other Ambulatory Visit: Payer: Self-pay

## 2020-05-23 ENCOUNTER — Ambulatory Visit: Payer: BC Managed Care – PPO | Admitting: Internal Medicine

## 2020-05-23 DIAGNOSIS — K140 Glossitis: Secondary | ICD-10-CM | POA: Diagnosis not present

## 2020-05-23 MED ORDER — LIDOCAINE VISCOUS HCL 2 % MT SOLN: 5.0000 mL | Freq: Four times a day (QID) | OROMUCOSAL | 1 refills | Status: DC | PRN

## 2020-05-23 NOTE — Assessment & Plan Note (Signed)
Mild Doesn't seem to be thrush No clear inciting event Will try medicated mouthwash

## 2020-05-23 NOTE — Progress Notes (Signed)
Subjective:    Patient ID: Shelly Hood, female    DOB: Jan 04, 1956, 65 y.o.   MRN: [id number]  HPI Here due to concerns about possible thrush This visit occurred during the SARS-CoV-2 public health emergency.  Safety protocols were in place, including screening questions prior to the visit, additional usage of staff PPE, and extensive cleaning of exam room while observing appropriate contact time as indicated for disinfecting solutions.   She has noticed some white areas on tongue Some pain---especially with citrus or salty foods Started 2 days ago--and worsened yesterday No new foods No burn or other trauma  No prednisone or antiinflammatory Rx Did get cortisone injection for trigger finger  Current Outpatient Medications on File Prior to Visit  Medication Sig Dispense Refill  . acetaminophen (TYLENOL) 500 MG tablet Take 1,000 mg by mouth every 6 (six) hours as needed for mild pain or fever (pain and fever). Reported on 05/17/2015    . atorvastatin (LIPITOR) 20 MG tablet Take 1 tablet (20 mg total) by mouth daily. 90 tablet 3  . FLUoxetine (PROZAC) 40 MG capsule Take 1 capsule (40 mg total) by mouth daily. 90 capsule 3  . Probiotic Product (PROBIOTIC ADVANCED PO) Take by mouth. Reported on 08/18/2015    . ALPRAZolam (XANAX) 0.25 MG tablet Take 1 tablet (0.25 mg total) by mouth daily as needed for anxiety. 90 tablet 0   No current facility-administered medications on file prior to visit.    Allergies  Allergen Reactions  . Cefotetan Other (See Comments)    Thrombocytopenia, plat ct 18K  . Naproxen     ulcers  . Nsaids     DUODENAL ULCER  . Aspirin     Ulcers   . Ibuprofen     ulcers   . Toradol [Ketorolac Tromethamine]     ulcers  . Codeine Sulfate Nausea Only    REACTION: nausea only tolerates hydrocodone  . Flagyl [Metronidazole] Other (See Comments)    Weak, bad taste     Past Medical History:  Diagnosis Date  . Clostridium difficile colitis 11/30/2014  .  Clostridium difficile infection   . Depression   . Gastric outlet obstruction   . GERD (gastroesophageal reflux disease)   . High cholesterol   . Obesity   . Panic attacks   . Peptic ulcer disease 12/02/2014  . Pyloric stenosis   . Pyloric ulcer   . Thrombocytopenia ([place]) 06/26/2014    Past Surgical History:  Procedure Laterality Date  . ANTRECTOMY N/A 06/24/2014   Procedure: ROBOTIC DISTAL GASTRECTOMY;  Surgeon: Michael Boston, MD;  [facility];  Service: General;  Laterality: N/A;  . COLONOSCOPY  AGE 89  . COLONOSCOPY N/A 10/19/2014   Procedure: COLONOSCOPY;  Surgeon: Gatha Mayer, MD;  Location: [place];  Service: Endoscopy;  Laterality: N/A;  with FMT  . FECAL TRANSPLANT N/A 10/19/2014   Procedure: FECAL TRANSPLANT;  Surgeon: Gatha Mayer, MD;  Location: [place];  Service: Endoscopy;  Laterality: N/A;  . LAPAROSCOPIC ROUX-EN-Y GASTRIC BYPASS WITH HIATAL HERNIA REPAIR N/A 06/24/2014   Procedure: ROBOTIC ROUX-EN-Y CONSTRUCTION WITH HIATAL HERNIA REPAIR;  Surgeon: Michael Boston, MD;  [facility];  Service: General;  Laterality: N/A;  . 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 RHINOPLASTY     X3  . VAGOTOMY N/A 06/24/2014   Procedure: ROBOTIC TRUNCAL VAGOTOMY ;  Surgeon: Michael Boston, MD;  [facility];  Service: General;  Laterality: N/A;  This is [name]'s preference    Family History  Problem Relation Age of Onset  . Diabetes Mother   . Diabetes Maternal Grandmother   . Heart disease Father   . Heart disease Brother        Quarry manager  . Diabetes Brother   . Breast cancer Paternal Grandmother   . Colon cancer Neg Hx     Social History   Socioeconomic History  . Marital status: Married    Spouse name: Not on file  . Number of children: 2  . Years of education: Not on file  . Highest education level: Not on file  Occupational History  . Occupation: Real Herbalist    Comment: [employer] in  [place]  Tobacco Use  . Smoking status: Former Smoker    Packs/day: 0.25    Years: 30.00    Pack years: 7.50    Types: Cigarettes    Quit date: 06/18/2014    Years since quitting: 5.9  . Smokeless tobacco: Never Used  Substance and Sexual Activity  . Alcohol use: No    Alcohol/week: 0.0 standard drinks  . Drug use: No  . Sexual activity: Not Currently    Partners: Male    Birth control/protection: Post-menopausal  Other Topics Concern  . Not on file  Social History Narrative  . Not on file   Social Determinants of Health   Financial Resource Strain: Not on file  Food Insecurity: Not on file  Transportation Needs: Not on file  Physical Activity: Not on file  Stress: Not on file  Social Connections: Not on file  Intimate Partner Violence: Not on file   Review of Systems Appetite is okay No N/V    Objective:   Physical Exam HENT:     Mouth/Throat:     Comments: Slight irritated areas with whitish slough on tongue--no white patches Buccal mucosa is all normal           Assessment & Plan:

## 2020-06-16 MED ORDER — ALPRAZOLAM 0.25 MG PO TABS: 0.2500 mg | ORAL_TABLET | Freq: Every day | ORAL | 0 refills | Status: DC | PRN

## 2020-06-16 NOTE — Telephone Encounter (Signed)
Last filled 03-29-20 #90 Last OV Acute 05-23-20 Next OV 07-14-20 [facility]

## 2020-07-14 ENCOUNTER — Ambulatory Visit (INDEPENDENT_AMBULATORY_CARE_PROVIDER_SITE_OTHER): Payer: BC Managed Care – PPO | Admitting: Internal Medicine

## 2020-07-14 ENCOUNTER — Other Ambulatory Visit: Payer: Self-pay

## 2020-07-14 ENCOUNTER — Encounter: Payer: Self-pay | Admitting: Internal Medicine

## 2020-07-14 VITALS — BP 110/62 | HR 77 | Temp 97.6°F | Ht 60.0 in | Wt 147.0 lb

## 2020-07-14 DIAGNOSIS — E785 Hyperlipidemia, unspecified: Secondary | ICD-10-CM | POA: Diagnosis not present

## 2020-07-14 DIAGNOSIS — Z0000 Encounter for general adult medical examination without abnormal findings: Secondary | ICD-10-CM | POA: Diagnosis not present

## 2020-07-14 DIAGNOSIS — F419 Anxiety disorder, unspecified: Secondary | ICD-10-CM | POA: Diagnosis not present

## 2020-07-14 LAB — CBC
HCT: 33.8 % — ABNORMAL LOW (ref 36.0–46.0)
Hemoglobin: 11.1 g/dL — ABNORMAL LOW (ref 12.0–15.0)
MCHC: 32.9 g/dL (ref 30.0–36.0)
MCV: 85.5 fl (ref 78.0–100.0)
Platelets: 265 10*3/uL (ref 150.0–400.0)
RBC: 3.96 Mil/uL (ref 3.87–5.11)
RDW: 14.5 % (ref 11.5–15.5)
WBC: 5.7 10*3/uL (ref 4.0–10.5)

## 2020-07-14 LAB — COMPREHENSIVE METABOLIC PANEL
ALT: 17 U/L (ref 0–35)
AST: 18 U/L (ref 0–37)
Albumin: 3.8 g/dL (ref 3.5–5.2)
Alkaline Phosphatase: 75 U/L (ref 39–117)
BUN: 12 mg/dL (ref 6–23)
CO2: 30 mEq/L (ref 19–32)
Calcium: 9 mg/dL (ref 8.4–10.5)
Chloride: 105 mEq/L (ref 96–112)
Creatinine, Ser: 0.8 mg/dL (ref 0.40–1.20)
GFR: 78.94 mL/min (ref >60.00)
Glucose, Bld: 71 mg/dL (ref 70–99)
Potassium: 4.8 mEq/L (ref 3.5–5.1)
Sodium: 140 mEq/L (ref 135–145)
Total Bilirubin: 0.2 mg/dL (ref 0.2–1.2)
Total Protein: 6.4 g/dL (ref 6.0–8.3)

## 2020-07-14 LAB — LIPID PANEL
Cholesterol: 136 mg/dL (ref 0–200)
HDL: 43.8 mg/dL (ref >39.00)
LDL Cholesterol: 71 mg/dL (ref 0–99)
NonHDL: 92.11
Total CHOL/HDL Ratio: 3
Triglycerides: 106 mg/dL (ref 0.0–149.0)
VLDL: 21.2 mg/dL (ref 0.0–40.0)

## 2020-07-14 MED ORDER — FLUOXETINE HCL 20 MG PO CAPS: 20.0000 mg | ORAL_CAPSULE | Freq: Every day | ORAL | 3 refills | Status: DC

## 2020-07-14 NOTE — Assessment & Plan Note (Signed)
No problems with the statin

## 2020-07-14 NOTE — Patient Instructions (Signed)
Please set up your screening mammogram!!  Please cut back your fluoxetine to every other day till the 40mg  size runs out. Then start the 20mg  daily. Only take the xanax as needed---like if your anxiety acts up.

## 2020-07-14 NOTE — Assessment & Plan Note (Signed)
Better since changing jobs Will change xanax to prn Cut down on fluoxetine to 20mg  daily

## 2020-07-14 NOTE — Progress Notes (Signed)
Subjective:    Patient ID: Shelly Hood, female    DOB: 03-Jul-1955, 64 y.o.   MRN: [id number]  HPI Here for physical This visit occurred during the SARS-CoV-2 public health emergency.  Safety protocols were in place, including screening questions prior to the visit, additional usage of staff PPE, and extensive cleaning of exam room while observing appropriate contact time as indicated for disinfecting solutions.   Doing well Tongue did get better Did see Dr Lorelei Pont for triggering in left thumb---got injected. Is better Still with thickening in her thumbs  Quit her job after 24 years Now working at [employer] (4 hours a day) Loves it Mood is better  Current Outpatient Medications on File Prior to Visit  Medication Sig Dispense Refill  . acetaminophen (TYLENOL) 500 MG tablet Take 1,000 mg by mouth every 6 (six) hours as needed for mild pain or fever (pain and fever). Reported on 05/17/2015    . ALPRAZolam (XANAX) 0.25 MG tablet Take 1 tablet (0.25 mg total) by mouth daily as needed for anxiety. 90 tablet 0  . atorvastatin (LIPITOR) 20 MG tablet Take 1 tablet (20 mg total) by mouth daily. 90 tablet 3  . FLUoxetine (PROZAC) 40 MG capsule Take 1 capsule (40 mg total) by mouth daily. 90 capsule 3  . magic mouthwash (lidocaine, diphenhydrAMINE, alum & mag hydroxide) suspension Swish and spit 5 mLs 4 (four) times daily as needed for mouth pain. 240 mL 1  . Probiotic Product (PROBIOTIC ADVANCED PO) Take by mouth. Reported on 08/18/2015     No current facility-administered medications on file prior to visit.    Allergies  Allergen Reactions  . Cefotetan Other (See Comments)    Thrombocytopenia, plat ct 18K  . Naproxen     ulcers  . Nsaids     DUODENAL ULCER  . Aspirin     Ulcers   . Ibuprofen     ulcers   . Toradol [Ketorolac Tromethamine]     ulcers  . Codeine Sulfate Nausea Only    REACTION: nausea only tolerates hydrocodone  . Flagyl [Metronidazole]  Other (See Comments)    Weak, bad taste     Past Medical History:  Diagnosis Date  . Clostridium difficile colitis 11/30/2014  . Clostridium difficile infection   . Depression   . Gastric outlet obstruction   . GERD (gastroesophageal reflux disease)   . High cholesterol   . Obesity   . Panic attacks   . Peptic ulcer disease 12/02/2014  . Pyloric stenosis   . Pyloric ulcer   . Thrombocytopenia ([place]) 06/26/2014    Past Surgical History:  Procedure Laterality Date  . ANTRECTOMY N/A 06/24/2014   Procedure: ROBOTIC DISTAL GASTRECTOMY;  Surgeon: Michael Boston, MD;  [facility];  Service: General;  Laterality: N/A;  . COLONOSCOPY  AGE 37  . COLONOSCOPY N/A 10/19/2014   Procedure: COLONOSCOPY;  Surgeon: Gatha Mayer, MD;  Location: [place];  Service: Endoscopy;  Laterality: N/A;  with FMT  . FECAL TRANSPLANT N/A 10/19/2014   Procedure: FECAL TRANSPLANT;  Surgeon: Gatha Mayer, MD;  Location: [place];  Service: Endoscopy;  Laterality: N/A;  . LAPAROSCOPIC ROUX-EN-Y GASTRIC BYPASS WITH HIATAL HERNIA REPAIR N/A 06/24/2014   Procedure: ROBOTIC ROUX-EN-Y CONSTRUCTION WITH HIATAL HERNIA REPAIR;  Surgeon: Michael Boston, MD;  [facility];  Service: General;  Laterality: N/A;  . 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 RHINOPLASTY  X3  . VAGOTOMY N/A 06/24/2014   Procedure: ROBOTIC TRUNCAL VAGOTOMY ;  Surgeon: Michael Boston, MD;  [facility];  Service: General;  Laterality: N/A;  This is [name]'s preference    Family History  Problem Relation Age of Onset  . Diabetes Mother   . Diabetes Maternal Grandmother   . Heart disease Father   . Heart disease Brother        Quarry manager  . Diabetes Brother   . Lung cancer Brother   . Breast cancer Paternal Grandmother   . Colon cancer Neg Hx     Social History   Socioeconomic History  . Marital status: Married    Spouse name: Not on file  . Number of children: 2  . Years of  education: Not on file  . Highest education level: Not on file  Occupational History  . Occupation: Real estate management--retired    Comment: [employer] in [place]  . 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[employer]  Tobacco Use  . Smoking status: Former Smoker    Packs/day: 0.25    Years: 30.00    Pack years: 7.50    Types: Cigarettes    Quit date: 06/18/2014    Years since quitting: 6.0  . Smokeless tobacco: Never Used  Substance and Sexual Activity  . Alcohol use: No    Alcohol/week: 0.0 standard drinks  . Drug use: No  . Sexual activity: Not Currently    Partners: Male    Birth control/protection: Post-menopausal  Other Topics Concern  . Not on file  Social History Narrative  . Not on file   Social Determinants of Health   Financial Resource Strain: Not on file  Food Insecurity: Not on file  Transportation Needs: Not on file  Physical Activity: Not on file  Stress: Not on file  Social Connections: Not on file  Intimate Partner Violence: Not on file   Review of Systems  Constitutional:       Stays active---tree work, concrete, etc Wears seat belt  HENT: Negative for dental problem, hearing loss and tinnitus.   Eyes: Negative for visual disturbance.       No diplopia or unilateral vision loss  Respiratory: Negative for cough, chest tightness and shortness of breath.   Cardiovascular: Negative for chest pain, palpitations and leg swelling.  Gastrointestinal: Negative for blood in stool and constipation.       No heartburn  Genitourinary: Negative for dyspareunia, dysuria and hematuria.       Last Pap fine---going back to gyn  Musculoskeletal: Negative for arthralgias, back pain and joint swelling.  Skin: Negative for rash.  Allergic/Immunologic: Positive for environmental allergies. Negative for immunocompromised state.       No meds  Neurological: Negative for dizziness, syncope, light-headedness and headaches.  Hematological: Negative for adenopathy. Bruises/bleeds  easily.  Psychiatric/Behavioral: Negative for dysphoric mood and sleep disturbance. The patient is not nervous/anxious.        Objective:   Physical Exam Constitutional:      Appearance: Normal appearance.  HENT:     Right Ear: Tympanic membrane, ear canal and external ear normal.     Left Ear: Tympanic membrane, ear canal and external ear normal.     Mouth/Throat:     Pharynx: No oropharyngeal exudate or posterior oropharyngeal erythema.  Eyes:     Conjunctiva/sclera: Conjunctivae normal.     Pupils: Pupils are equal, round, and reactive to light.  Cardiovascular:     Rate and Rhythm: Normal rate and regular rhythm.  Pulses: Normal pulses.     Heart sounds: No murmur heard. No gallop.   Pulmonary:     Effort: Pulmonary effort is normal.     Breath sounds: Normal breath sounds. No wheezing or rales.  Abdominal:     Palpations: Abdomen is soft.     Tenderness: There is no abdominal tenderness.  Musculoskeletal:     Cervical back: Neck supple.     Right lower leg: No edema.     Left lower leg: No edema.  Lymphadenopathy:     Cervical: No cervical adenopathy.  Skin:    General: Skin is warm.     Findings: No rash.  Neurological:     General: No focal deficit present.     Mental Status: She is alert and oriented to person, place, and time.  Psychiatric:        Mood and Affect: Mood normal.        Behavior: Behavior normal.            Assessment & Plan:

## 2020-07-14 NOTE — Assessment & Plan Note (Signed)
Healthy Overdue for mammogram Pap per gyn Stays active Still prefers no COVID vaccine Flu vaccine in the fall

## 2020-07-19 ENCOUNTER — Telehealth: Payer: Self-pay

## 2020-07-19 DIAGNOSIS — D649 Anemia, unspecified: Secondary | ICD-10-CM

## 2020-07-19 NOTE — Addendum Note (Signed)
Addended by: Viviana Simpler I on: 07/19/2020 12:53 PM   Modules accepted: Orders

## 2020-07-19 NOTE — Telephone Encounter (Signed)
Returning phone call °

## 2020-07-19 NOTE — Telephone Encounter (Signed)
Left message to call office

## 2020-07-19 NOTE — Telephone Encounter (Signed)
Spoke to pt. She would like to do the referral for the colonoscopy. Also, asking about endoscopy due to the ulcer she had. Good with going back to Dr Fuller Plan.

## 2020-07-19 NOTE — Telephone Encounter (Signed)
Shelly Carbon, MD  07/16/2020 12:45 PM EDT Back to Top     Results released Confirm she is okay with me making referral for a colonoscopy--the one she had in 2016 was just for transplant (so she is due now---especially with the mild anemia)     Patient Communication  Edit Comments Add Notifications Back to Top    Your labs are all normal---except for a mild anemia (low hemoglobin and HCT). I would like to refer you back to Dr Fuller Plan for another colonoscopy---it turns out the one you had in 2016 was just for the fecal transplant and you are due now. Larene Beach will call about this The cholesterol was good  Written by Shelly Carbon, MD on 07/16/2020 12:45 PM EDT

## 2020-07-19 NOTE — Telephone Encounter (Signed)
Left message letting patient know this. Advised patient to call [facility] office to schedule directly, they will need to discuss the details in regards to the colonoscopy schedule or do they need to see her for consult first.

## 2020-07-19 NOTE — Telephone Encounter (Signed)
Let her know I sent the referral and hopefully she will hear about this soon

## 2020-07-20 NOTE — Telephone Encounter (Signed)
Would like to go to [facility] instead Dr Chales Abrahams

## 2020-07-20 NOTE — Telephone Encounter (Signed)
I don't know why she is changing her mind. Do I need to put in a different order or can you just change to a different provider?

## 2020-07-20 NOTE — Telephone Encounter (Signed)
Referral updated to the new location and faxed. Their office will reach out to the patient to schedule. Patient advised.

## 2020-08-04 ENCOUNTER — Other Ambulatory Visit: Payer: Self-pay | Admitting: Internal Medicine

## 2020-08-08 ENCOUNTER — Telehealth: Payer: Self-pay | Admitting: Internal Medicine

## 2020-08-08 DIAGNOSIS — Z1211 Encounter for screening for malignant neoplasm of colon: Secondary | ICD-10-CM

## 2020-08-08 NOTE — Telephone Encounter (Signed)
Left a message on VM per DPR for pt to see what she would like to do.

## 2020-08-08 NOTE — Telephone Encounter (Signed)
Mrs. Shelly Hood called in from [facility] and wanted to get clarification if this if for a procedure only or consult (they wanted to let them that Dr. Clovis Cao is no longer seeing general GI patients but can be seen by another GI doctor.

## 2020-08-08 NOTE — Telephone Encounter (Signed)
This was her choice--I really don't have a preference for who she sees. She only needs the colonoscopy (screening)

## 2020-08-10 NOTE — Telephone Encounter (Signed)
Need a new referral for the patient with diagnoses of screening for colon cancer for colonoscopy. Original referral was placed under diagnoses for anemia with no other explanation, that would usually mean consultation.  Once this is done we can fax it to the [facility] and they will speak with patient about scheduling. Thank you.

## 2020-08-10 NOTE — Addendum Note (Signed)
Addended by: Viviana Simpler I on: 08/10/2020 12:55 PM   Modules accepted: Orders

## 2020-09-14 ENCOUNTER — Other Ambulatory Visit: Payer: Self-pay

## 2020-09-14 MED ORDER — ALPRAZOLAM 0.25 MG PO TABS: 0.2500 mg | ORAL_TABLET | Freq: Every day | ORAL | 0 refills | Status: DC | PRN

## 2020-09-14 NOTE — Telephone Encounter (Signed)
Last written 06-16-20 #90 Last OV 07-14-20 Next OV 07-20-21 [facility]

## 2020-10-04 ENCOUNTER — Ambulatory Visit: Payer: BC Managed Care – PPO | Admitting: Family Medicine

## 2020-10-04 ENCOUNTER — Encounter: Payer: Self-pay | Admitting: Family Medicine

## 2020-10-04 ENCOUNTER — Other Ambulatory Visit: Payer: Self-pay

## 2020-10-04 VITALS — BP 100/70 | HR 91 | Temp 97.7°F | Ht 60.0 in | Wt 145.0 lb

## 2020-10-04 DIAGNOSIS — M65312 Trigger thumb, left thumb: Secondary | ICD-10-CM | POA: Diagnosis not present

## 2020-10-04 MED ORDER — TRIAMCINOLONE ACETONIDE 40 MG/ML IJ SUSP
20.0000 mg | Freq: Once | INTRAMUSCULAR | Status: AC
  Administered 2020-10-04: 20 mg via INTRA_ARTICULAR

## 2020-10-04 NOTE — Addendum Note (Signed)
Addended by: Carter Kitten on: 10/04/2020 11:33 AM   Modules accepted: Orders

## 2020-10-04 NOTE — Progress Notes (Signed)
    Shelly Hood T. Areeba Sulser, MD, [facility] [place], [address]  Phone: [phone]  FAX: [phone]  [name] - 65 y.o. female  MRN [id number]  Date of Birth: 1955/10/08  Date: 10/04/2020  PCP: Venia Carbon, MD  Referral: Venia Carbon, MD  Chief Complaint  Patient presents with   Trigger Finger    Bilateral Thumbs & Left Ring Finger    This visit occurred during the SARS-CoV-2 public health emergency.  Safety protocols were in place, including screening questions prior to the visit, additional usage of staff PPE, and extensive cleaning of exam room while observing appropriate contact time as indicated for disinfecting solutions.    Multiple trigger fingers:  - L 4th (no pain) and L thumb.   L thumb injection.    She is having some mild triggering at R thumb and L trigger finger, but these are not painful.    Tendon Sheath Injection Procedure Note TAYLEY ROLLI 1955/11/25 Date of procedure: 10/04/2020  Procedure: Tendon Sheath Injection for Trigger Finger, L 1st Indications: Pain  Procedure Details Verbal consent was obtained. Risks (including potential risk for skin lightening and potential atrophy), benefits and alternatives were discussed. Prepped with Chloraprep and Ethyl Chloride used for anesthesia. Under sterile conditions, patient injected at palmar crease aiming distally with 45 degree angle towards nodule; injected directly into tendon sheath. Medication flowed freely without resistance.  Needle size: 22 gauge 1 1/2 inch Injection: 1/2 cc of Lidocaine 1% and Kenalog 20 mg Medication: 1/2 cc of Kenalog 40 mg (equaling Kenalog 20 mg)     ICD-10-CM   1. Trigger thumb, left thumb  M65.312       No orders of the defined types were placed in this encounter.

## 2020-12-12 ENCOUNTER — Other Ambulatory Visit: Payer: Self-pay

## 2020-12-12 DIAGNOSIS — K311 Adult hypertrophic pyloric stenosis: Secondary | ICD-10-CM

## 2020-12-12 DIAGNOSIS — Z1211 Encounter for screening for malignant neoplasm of colon: Secondary | ICD-10-CM

## 2020-12-13 ENCOUNTER — Telehealth: Payer: Self-pay | Admitting: Gastroenterology

## 2020-12-13 DIAGNOSIS — Z1211 Encounter for screening for malignant neoplasm of colon: Secondary | ICD-10-CM

## 2020-12-13 MED ORDER — ALPRAZOLAM 0.25 MG PO TABS: 0.2500 mg | ORAL_TABLET | Freq: Every day | ORAL | 0 refills | Status: DC | PRN

## 2020-12-13 NOTE — Telephone Encounter (Signed)
Last written 09-14-20 #90 Last OV Acute 10-04-20 Next OV 07-20-21 [facility]

## 2020-12-13 NOTE — Telephone Encounter (Signed)
Request received to transfer GI care from outside practice to [facility].  We appreciate the interest in our practice, however at this time due to high demand from patients without established GI providers we cannot accommodate this transfer.  Ability to accommodate future transfer requests may change over time and the patient can contact us again in 6-12 months if still interested in being seen at [facility].      °

## 2020-12-20 NOTE — Telephone Encounter (Signed)
Find out whether [facility] would actually do a EGD just based on my order----no GI doctors I know of will do that. Send in Rx for fluoxetine 40 mg and set up follow up in about 1 month to see how it is working. Call her to review this stuff---I really don't think I can "order" an EGD

## 2020-12-27 MED ORDER — FLUOXETINE HCL 40 MG PO CAPS: 40.0000 mg | ORAL_CAPSULE | Freq: Every day | ORAL | 3 refills | Status: DC

## 2020-12-27 NOTE — Telephone Encounter (Signed)
I sent in fluoxetine to [facility]. Will send back to Dr Silvio Pate to place the order

## 2020-12-27 NOTE — Addendum Note (Signed)
Addended by: Pilar Grammes on: 12/27/2020 04:22 PM   Modules accepted: Orders

## 2020-12-28 NOTE — Addendum Note (Signed)
Addended by: Viviana Simpler I on: 12/28/2020 12:45 PM   Modules accepted: Orders

## 2021-01-15 ENCOUNTER — Encounter: Payer: Self-pay | Admitting: *Deleted

## 2021-03-19 ENCOUNTER — Other Ambulatory Visit: Payer: Self-pay | Admitting: Internal Medicine

## 2021-03-19 MED ORDER — ALPRAZOLAM 0.25 MG PO TABS: 0.2500 mg | ORAL_TABLET | Freq: Every day | ORAL | 0 refills | Status: DC | PRN

## 2021-03-19 NOTE — Telephone Encounter (Signed)
Last written 12-13-20 #90 Last OV Acute 10-04-20 Next OV 07-20-21 [facility]

## 2021-06-10 ENCOUNTER — Other Ambulatory Visit: Payer: Self-pay | Admitting: Internal Medicine

## 2021-06-12 MED ORDER — ALPRAZOLAM 0.25 MG PO TABS
0.2500 mg | ORAL_TABLET | Freq: Every day | ORAL | 0 refills | Status: DC | PRN
Start: 2021-06-12 — End: 2021-09-10

## 2021-06-12 NOTE — Telephone Encounter (Signed)
Last written 03-19-21 ?Last OV 07-14-20 ?Next OV 07-20-21 ?[facility] ?

## 2021-07-20 ENCOUNTER — Encounter: Payer: BC Managed Care – PPO | Admitting: Internal Medicine

## 2021-08-11 ENCOUNTER — Other Ambulatory Visit: Payer: Self-pay | Admitting: Internal Medicine

## 2021-09-10 ENCOUNTER — Ambulatory Visit (INDEPENDENT_AMBULATORY_CARE_PROVIDER_SITE_OTHER): Payer: BC Managed Care – PPO | Admitting: Internal Medicine

## 2021-09-10 ENCOUNTER — Encounter: Payer: Self-pay | Admitting: Internal Medicine

## 2021-09-10 VITALS — BP 128/78 | HR 79 | Temp 97.8°F | Ht 59.75 in | Wt 143.0 lb

## 2021-09-10 DIAGNOSIS — Z0000 Encounter for general adult medical examination without abnormal findings: Secondary | ICD-10-CM | POA: Diagnosis not present

## 2021-09-10 DIAGNOSIS — F419 Anxiety disorder, unspecified: Secondary | ICD-10-CM | POA: Diagnosis not present

## 2021-09-10 DIAGNOSIS — E785 Hyperlipidemia, unspecified: Secondary | ICD-10-CM | POA: Diagnosis not present

## 2021-09-10 DIAGNOSIS — Z1211 Encounter for screening for malignant neoplasm of colon: Secondary | ICD-10-CM

## 2021-09-10 MED ORDER — ALPRAZOLAM 0.25 MG PO TABS: 0.2500 mg | ORAL_TABLET | Freq: Every day | ORAL | 0 refills | Status: DC | PRN

## 2021-09-10 MED ORDER — FLUOXETINE HCL 40 MG PO CAPS: 40.0000 mg | ORAL_CAPSULE | Freq: Every day | ORAL | 3 refills | Status: DC

## 2021-09-10 MED ORDER — ATORVASTATIN CALCIUM 20 MG PO TABS: 20.0000 mg | ORAL_TABLET | Freq: Every day | ORAL | 3 refills | Status: DC

## 2021-09-10 NOTE — Assessment & Plan Note (Signed)
No problems with primary prevention 

## 2021-09-10 NOTE — Assessment & Plan Note (Signed)
Healthy Discussed fitness Overdue for colonoscopy--referral to [facility] at her request Overdue for [facility] her to set this up Needs another Pap---she will set up with gyn Prefers no COVID vaccine

## 2021-09-10 NOTE — Assessment & Plan Note (Addendum)
With ongoing stress now Continues on fluoxetine 40 and xanax 0.25 in AM May need xanax later in the day also
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
## 2021-09-10 NOTE — Patient Instructions (Addendum)
Please set up your screening mammogram. Please set up with a new gynecologist. You might want to try over the counter diclofenac gel on your hands---first thing in the morning (it can be three times a day if needed)

## 2021-09-11 LAB — COMPREHENSIVE METABOLIC PANEL
AG Ratio: 1.9 (calc) (ref 1.0–2.5)
ALT: 16 U/L (ref 6–29)
AST: 19 U/L (ref 10–35)
Albumin: 4.1 g/dL (ref 3.6–5.1)
Alkaline phosphatase (APISO): 74 U/L (ref 37–153)
BUN: 9 mg/dL (ref 7–25)
CO2: 28 mmol/L (ref 20–32)
Calcium: 9.1 mg/dL (ref 8.6–10.4)
Chloride: 105 mmol/L (ref 98–110)
Creat: 0.85 mg/dL (ref 0.50–1.05)
Globulin: 2.2 g/dL (calc) (ref 1.9–3.7)
Glucose, Bld: 91 mg/dL (ref 65–99)
Potassium: 4.3 mmol/L (ref 3.5–5.3)
Sodium: 142 mmol/L (ref 135–146)
Total Bilirubin: 0.3 mg/dL (ref 0.2–1.2)
Total Protein: 6.3 g/dL (ref 6.1–8.1)

## 2021-09-11 LAB — LIPID PANEL
Cholesterol: 151 mg/dL (ref <200)
HDL: 54 mg/dL (ref >=50)
LDL Cholesterol (Calc): 78 mg/dL (calc)
Non-HDL Cholesterol (Calc): 97 mg/dL (calc) (ref <130)
Total CHOL/HDL Ratio: 2.8 (calc) (ref <5.0)
Triglycerides: 108 mg/dL (ref <150)

## 2021-09-11 LAB — CBC
HCT: 35 % (ref 35.0–45.0)
Hemoglobin: 11.6 g/dL — ABNORMAL LOW (ref 11.7–15.5)
MCH: 28.7 pg (ref 27.0–33.0)
MCHC: 33.1 g/dL (ref 32.0–36.0)
MCV: 86.6 fL (ref 80.0–100.0)
MPV: 11.2 fL (ref 7.5–12.5)
Platelets: 293 10*3/uL (ref 140–400)
RBC: 4.04 10*6/uL (ref 3.80–5.10)
RDW: 14.5 % (ref 11.0–15.0)
WBC: 6.6 10*3/uL (ref 3.8–10.8)

## 2021-10-15 ENCOUNTER — Encounter: Payer: Self-pay | Admitting: Internal Medicine

## 2021-10-15 MED ORDER — ALPRAZOLAM 0.25 MG PO TABS: 0.2500 mg | ORAL_TABLET | Freq: Two times a day (BID) | ORAL | 0 refills | Status: DC | PRN

## 2021-11-15 ENCOUNTER — Encounter: Payer: Self-pay | Admitting: Internal Medicine

## 2021-11-21 ENCOUNTER — Telehealth: Payer: Self-pay | Admitting: Internal Medicine

## 2021-11-26 ENCOUNTER — Encounter: Payer: Self-pay | Admitting: Internal Medicine

## 2021-11-26 MED ORDER — ATORVASTATIN CALCIUM 20 MG PO TABS: 20.0000 mg | ORAL_TABLET | Freq: Every day | ORAL | 3 refills | Status: DC

## 2021-11-26 NOTE — Telephone Encounter (Signed)
Spoke to pt. I went ahead and sent the atorvastatin again.

## 2021-11-26 NOTE — Telephone Encounter (Signed)
Patient called in stating that she still hasn't received her lipitor and the pharmacy is saying that they are still waiting on the doctor. Patient would like a phone call with advice,if you know what may be the hold up.

## 2021-11-26 NOTE — Addendum Note (Signed)
Addended by: Pilar Grammes on: 11/26/2021 03:19 PM   Modules accepted: Orders

## 2021-11-26 NOTE — Telephone Encounter (Signed)
error 

## 2021-12-12 NOTE — Telephone Encounter (Signed)
Error

## 2022-01-14 ENCOUNTER — Other Ambulatory Visit: Payer: Self-pay | Admitting: Internal Medicine

## 2022-01-15 MED ORDER — ALPRAZOLAM 0.25 MG PO TABS: 0.2500 mg | ORAL_TABLET | Freq: Two times a day (BID) | ORAL | 0 refills | Status: DC | PRN

## 2022-01-15 NOTE — Telephone Encounter (Signed)
Last written 10-15-21 #180 Last OV/CPE 09-10-21 No Future OV [facility]

## 2022-04-15 ENCOUNTER — Ambulatory Visit: Payer: BC Managed Care – PPO | Admitting: Internal Medicine

## 2022-04-15 ENCOUNTER — Encounter: Payer: Self-pay | Admitting: Internal Medicine

## 2022-04-15 VITALS — BP 122/68 | HR 75 | Temp 97.5°F | Resp 15 | Ht 59.0 in | Wt 139.0 lb

## 2022-04-15 DIAGNOSIS — R3915 Urgency of urination: Secondary | ICD-10-CM

## 2022-04-15 DIAGNOSIS — R300 Dysuria: Secondary | ICD-10-CM

## 2022-04-15 DIAGNOSIS — E611 Iron deficiency: Secondary | ICD-10-CM

## 2022-04-15 DIAGNOSIS — R112 Nausea with vomiting, unspecified: Secondary | ICD-10-CM | POA: Diagnosis not present

## 2022-04-15 DIAGNOSIS — Z8711 Personal history of peptic ulcer disease: Secondary | ICD-10-CM | POA: Diagnosis not present

## 2022-04-15 LAB — URINALYSIS, ROUTINE W REFLEX MICROSCOPIC
Bilirubin Urine: NEGATIVE
Ketones, ur: NEGATIVE
Nitrite: NEGATIVE
Specific Gravity, Urine: <=1.005 — AB (ref 1.000–1.030)
Urine Glucose: NEGATIVE
Urobilinogen, UA: 0.2 (ref 0.0–1.0)
pH: 6 (ref 5.0–8.0)

## 2022-04-15 LAB — POCT URINALYSIS DIPSTICK
Bilirubin, UA: NEGATIVE
Glucose, UA: NEGATIVE
Ketones, UA: NEGATIVE
Nitrite, UA: NEGATIVE
Protein, UA: POSITIVE — AB
Spec Grav, UA: <=1.005 — AB (ref 1.010–1.025)
Urobilinogen, UA: 0.2 E.U./dL
pH, UA: 5.5 (ref 5.0–8.0)

## 2022-04-15 MED ORDER — OMEPRAZOLE 40 MG PO CPDR: 40.0000 mg | DELAYED_RELEASE_CAPSULE | Freq: Every day | ORAL | 3 refills | Status: DC

## 2022-04-15 NOTE — Patient Instructions (Addendum)
Your urine culture will take 48 hours to result  You can use AZO for the discomfort.    I agree that your stomach needs further workup  Please start the omeprazole 40 mg at bedtime to see if it helps  I will make the GI referral to [facility] for your abdominal symptoms   Please schedule your CPE so you can get caught up on screening with mammogram and PAP smear

## 2022-04-15 NOTE — Progress Notes (Unsigned)
Subjective:  Patient ID: Shelly Hood, female    DOB: 07-Aug-1955  Age: 67 y.o. MRN: [id number]  CC: There were no encounter diagnoses.   HPI Shelly Hood presents for evaluation of urinary urgency  pressure, cramping eft sided pain and hematuria since Saturday evening .    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    No nausea or fevers.  Left sided pain  is  described as an ache ,  not severe.  Not CVA more anteriorly.    Has loose stools daily   Scheduled for colonoscopy by [facility] in April,  but currently wants to see a gastroenterologist at [facility] .  due to recurrent indigestion and epigastric pain accompanied by a  "hard knot" accompanied by hypersalivation followed by recurrent episodes of vomiting  of undigested food  , often food she ate several days earlier  . Occurring 1-2 times weekly ,   first vomitus is grease   Takes probiotics daily ,  and mylanta prn (3/week) .  No PPI or H2 blocker   Waking up with severe heartburn  some mornings.   Outpatient Medications Prior to Visit  Medication Sig Dispense Refill   acetaminophen (TYLENOL) 500 MG tablet Take 1,000 mg by mouth every 6 (six) hours as needed for mild pain or fever (pain and fever). Reported on 05/17/2015     ALPRAZolam (XANAX) 0.25 MG tablet Take 1 tablet (0.25 mg total) by mouth 2 (two) times daily as needed for anxiety. 180 tablet 0   atorvastatin (LIPITOR) 20 MG tablet Take 1 tablet (20 mg total) by mouth daily. 90 tablet 3   FLUoxetine (PROZAC) 40 MG capsule Take 1 capsule (40 mg total) by mouth daily. 90 capsule 3   Probiotic Product (PROBIOTIC ADVANCED PO) Take by mouth. Reported on 08/18/2015     No facility-administered medications prior to visit.    Review of Systems;  Patient denies headache, fevers, malaise, unintentional weight loss, skin rash, eye  pain, sinus congestion and sinus pain, sore throat, dysphagia,  hemoptysis , cough, dyspnea, wheezing, chest pain, palpitations, orthopnea, edema, abdominal pain, nausea, melena, diarrhea, constipation, flank pain, dysuria, hematuria, urinary  Frequency, nocturia, numbness, tingling, seizures,  Focal weakness, Loss of consciousness,  Tremor, insomnia, depression, anxiety, and suicidal ideation.      Objective:  There were no vitals taken for this visit.  BP Readings from Last 3 Encounters:  09/10/21 128/78  10/04/20 100/70  07/14/20 110/62    Wt Readings from Last 3 Encounters:  09/10/21 143 lb (64.9 kg)  10/04/20 145 lb (65.8 kg)  07/14/20 147 lb (66.7 kg)    Physical Exam  No results found for: "HGBA1C"  Lab Results  Component Value Date   CREATININE 0.85 09/10/2021   CREATININE 0.80 07/14/2020   CREATININE 0.85 07/09/2019    Lab Results  Component Value Date   WBC 6.6 09/10/2021   HGB 11.6 (L) 09/10/2021   HCT 35.0 09/10/2021   PLT 293 09/10/2021   GLUCOSE 91 09/10/2021   CHOL 151 09/10/2021   TRIG 108 09/10/2021   HDL 54 09/10/2021   LDLDIRECT 91.0 07/04/2017   LDLCALC 78 09/10/2021   ALT 16 09/10/2021   AST 19 09/10/2021   NA 142 09/10/2021   K 4.3 09/10/2021   CL 105 09/10/2021   CREATININE  0.85 09/10/2021   BUN 9 09/10/2021   CO2 28 09/10/2021   TSH 1.270 06/13/2016   INR 1.20 12/02/2014    MM DIGITAL SCREENING BILATERAL  Result Date: 09/02/2017 CLINICAL DATA:  Screening. EXAM: DIGITAL SCREENING BILATERAL MAMMOGRAM WITH CAD COMPARISON:  Previous exam(s). ACR Breast Density Category b: There are scattered areas of fibroglandular density. FINDINGS: There are no findings suspicious for malignancy. Images were processed with CAD. IMPRESSION: No mammographic evidence of malignancy. A result letter of this screening mammogram will be mailed directly to the patient. RECOMMENDATION: Screening mammogram in one year. (Code:SM-B-01Y) BI-RADS CATEGORY  1: Negative.  Electronically Signed   By: Everlean Alstrom M.D.   On: 09/02/2017 11:26    Assessment & Plan:  .There are no diagnoses linked to this encounter.   I provided 30 minutes of face-to-face time during this encounter reviewing patient's last visit with me, patient's  most recent visit with cardiology,  nephrology,  and neurology,  recent surgical and non surgical procedures, previous  labs and imaging studies, counseling on currently addressed issues,  and post visit ordering to diagnostics and therapeutics .   Follow-up: No follow-ups on file.   Crecencio Mc, MD

## 2022-04-16 ENCOUNTER — Telehealth: Payer: Self-pay | Admitting: *Deleted

## 2022-04-16 ENCOUNTER — Telehealth: Payer: Self-pay

## 2022-04-16 ENCOUNTER — Encounter: Payer: Self-pay | Admitting: Internal Medicine

## 2022-04-16 DIAGNOSIS — R112 Nausea with vomiting, unspecified: Secondary | ICD-10-CM | POA: Insufficient documentation

## 2022-04-16 DIAGNOSIS — R300 Dysuria: Secondary | ICD-10-CM | POA: Insufficient documentation

## 2022-04-16 DIAGNOSIS — R3915 Urgency of urination: Secondary | ICD-10-CM | POA: Insufficient documentation

## 2022-04-16 LAB — CBC WITH DIFFERENTIAL/PLATELET
Basophils Absolute: 0.1 10*3/uL (ref 0.0–0.1)
Basophils Relative: 1.2 % (ref 0.0–3.0)
Eosinophils Absolute: 0.1 10*3/uL (ref 0.0–0.7)
Eosinophils Relative: 1 % (ref 0.0–5.0)
HCT: 36.7 % (ref 36.0–46.0)
Hemoglobin: 12.1 g/dL (ref 12.0–15.0)
Lymphocytes Relative: 20.8 % (ref 12.0–46.0)
Lymphs Abs: 1.9 10*3/uL (ref 0.7–4.0)
MCHC: 33 g/dL (ref 30.0–36.0)
MCV: 87.2 fl (ref 78.0–100.0)
Monocytes Absolute: 0.9 10*3/uL (ref 0.1–1.0)
Monocytes Relative: 9.4 % (ref 3.0–12.0)
Neutro Abs: 6.2 10*3/uL (ref 1.4–7.7)
Neutrophils Relative %: 67.6 % (ref 43.0–77.0)
Platelets: 286 10*3/uL (ref 150.0–400.0)
RBC: 4.21 Mil/uL (ref 3.87–5.11)
RDW: 15 % (ref 11.5–15.5)
WBC: 9.2 10*3/uL (ref 4.0–10.5)

## 2022-04-16 LAB — COMPREHENSIVE METABOLIC PANEL
ALT: 14 U/L (ref 0–35)
AST: 18 U/L (ref 0–37)
Albumin: 4 g/dL (ref 3.5–5.2)
Alkaline Phosphatase: 77 U/L (ref 39–117)
BUN: 6 mg/dL (ref 6–23)
CO2: 23 mEq/L (ref 19–32)
Calcium: 8.9 mg/dL (ref 8.4–10.5)
Chloride: 106 mEq/L (ref 96–112)
Creatinine, Ser: 0.76 mg/dL (ref 0.40–1.20)
GFR: 82.92 mL/min (ref >60.00)
Glucose, Bld: 41 mg/dL — CL (ref 70–99)
Potassium: 3.8 mEq/L (ref 3.5–5.1)
Sodium: 142 mEq/L (ref 135–145)
Total Bilirubin: 0.2 mg/dL (ref 0.2–1.2)
Total Protein: 6.8 g/dL (ref 6.0–8.3)

## 2022-04-16 LAB — IRON,TIBC AND FERRITIN PANEL
%SAT: 13 % (calc) — ABNORMAL LOW (ref 16–45)
Ferritin: 4 ng/mL — ABNORMAL LOW (ref 16–288)
Iron: 58 ug/dL (ref 45–160)
TIBC: 448 mcg/dL (calc) (ref 250–450)

## 2022-04-16 LAB — HEMOGLOBIN A1C: Hgb A1c MFr Bld: 6.5 % (ref 4.6–6.5)

## 2022-04-16 NOTE — Assessment & Plan Note (Signed)
History of duodenal ulcer requiring pyloroplasty. Gastric emptying study ordered.   GI referral made

## 2022-04-16 NOTE — Telephone Encounter (Signed)
Culture still has not been resulted

## 2022-04-16 NOTE — Addendum Note (Signed)
Addended by: Crecencio Mc on: 04/16/2022 08:02 PM   Modules accepted: Orders

## 2022-04-16 NOTE — Assessment & Plan Note (Signed)
Given her history of c dificile colitis , I explained that I would not prescribe antibiotics unless UA and culture are conclusive of UTI

## 2022-04-16 NOTE — Telephone Encounter (Signed)
Butch Penny is calling from [place] to state patient states we referred her to [facility] and she wants to go to [facility].  Butch Penny would like to know if we can cancel order.

## 2022-04-16 NOTE — Telephone Encounter (Signed)
CRITICAL VALUE STICKER  CRITICAL VALUE: Low Glucose: 41  RECEIVER (on-site recipient of call): Phineas Real, CMA  DATE & TIME NOTIFIED: 04/16/22 @ 11:57am  MESSENGER (representative from lab): Esperanza Richters  MD NOTIFIED: Dr. Derrel Nip  TIME OF NOTIFICATION:12:05pm   RESPONSE:

## 2022-04-18 LAB — URINE CULTURE
MICRO NUMBER:: 14519122
SPECIMEN QUALITY:: ADEQUATE

## 2022-04-18 MED ORDER — CIPROFLOXACIN HCL 250 MG PO TABS: 250.0000 mg | ORAL_TABLET | Freq: Two times a day (BID) | ORAL | 0 refills | Status: AC

## 2022-04-18 NOTE — Addendum Note (Signed)
Addended by: Crecencio Mc on: 04/18/2022 12:54 PM   Modules accepted: Orders

## 2022-04-18 NOTE — Assessment & Plan Note (Signed)
Will treat for E Coli UTI given pyuria, symptoms and 50 to 100 K colonies of E coli  continued use of probiotic advised.

## 2022-04-18 NOTE — Telephone Encounter (Signed)
Pt returned Marissa call, note on lab results was read to pt. She's aware.

## 2022-04-26 ENCOUNTER — Other Ambulatory Visit: Payer: Self-pay | Admitting: Internal Medicine

## 2022-04-29 ENCOUNTER — Other Ambulatory Visit: Payer: Self-pay | Admitting: Internal Medicine

## 2022-04-29 DIAGNOSIS — R112 Nausea with vomiting, unspecified: Secondary | ICD-10-CM

## 2022-04-30 NOTE — Addendum Note (Signed)
Addended by: Crecencio Mc on: 04/30/2022 10:19 PM   Modules accepted: Orders

## 2022-05-15 ENCOUNTER — Other Ambulatory Visit: Payer: Self-pay | Admitting: Internal Medicine

## 2022-05-16 MED ORDER — ALPRAZOLAM 0.25 MG PO TABS: 0.2500 mg | ORAL_TABLET | Freq: Two times a day (BID) | ORAL | 0 refills | Status: DC | PRN

## 2022-05-16 NOTE — Telephone Encounter (Signed)
Have her set up a PE for July or August

## 2022-05-16 NOTE — Telephone Encounter (Signed)
Last written 01-15-22 #180 Last OV/CPE 09-10-21 No Future OV [facility]

## 2022-05-24 DIAGNOSIS — K30 Functional dyspepsia: Secondary | ICD-10-CM | POA: Diagnosis not present

## 2022-05-24 DIAGNOSIS — K259 Gastric ulcer, unspecified as acute or chronic, without hemorrhage or perforation: Secondary | ICD-10-CM | POA: Diagnosis not present

## 2022-05-24 DIAGNOSIS — R112 Nausea with vomiting, unspecified: Secondary | ICD-10-CM | POA: Diagnosis not present

## 2022-05-24 DIAGNOSIS — Z8711 Personal history of peptic ulcer disease: Secondary | ICD-10-CM | POA: Diagnosis not present

## 2022-05-28 ENCOUNTER — Telehealth: Payer: Self-pay | Admitting: Internal Medicine

## 2022-05-28 NOTE — Telephone Encounter (Signed)
Patient saw Dr Derrel Nip on 04/15/2022, and a NM Gastric Empty was ordered. Patient had the test on 05/24/2022, results are in [facility]. [facility] scheduled an appointment with GI, appointment is not until August. Patient states she failed the test and wanted to know what the next step may be.

## 2022-06-10 ENCOUNTER — Ambulatory Visit: Payer: BC Managed Care – PPO | Admitting: Internal Medicine

## 2022-06-10 VITALS — BP 110/70 | HR 66 | Temp 97.5°F | Ht 59.0 in | Wt 140.0 lb

## 2022-06-10 DIAGNOSIS — E119 Type 2 diabetes mellitus without complications: Secondary | ICD-10-CM | POA: Insufficient documentation

## 2022-06-10 DIAGNOSIS — K311 Adult hypertrophic pyloric stenosis: Secondary | ICD-10-CM | POA: Diagnosis not present

## 2022-06-10 DIAGNOSIS — E161 Other hypoglycemia: Secondary | ICD-10-CM | POA: Insufficient documentation

## 2022-06-10 DIAGNOSIS — K3184 Gastroparesis: Secondary | ICD-10-CM | POA: Insufficient documentation

## 2022-06-10 DIAGNOSIS — R112 Nausea with vomiting, unspecified: Secondary | ICD-10-CM

## 2022-06-10 DIAGNOSIS — K911 Postgastric surgery syndromes: Secondary | ICD-10-CM

## 2022-06-10 DIAGNOSIS — E1169 Type 2 diabetes mellitus with other specified complication: Secondary | ICD-10-CM

## 2022-06-10 DIAGNOSIS — K912 Postsurgical malabsorption, not elsewhere classified: Secondary | ICD-10-CM | POA: Diagnosis not present

## 2022-06-10 HISTORY — DX: Type 2 diabetes mellitus without complications: E11.9

## 2022-06-10 MED ORDER — METOCLOPRAMIDE HCL 5 MG PO TABS: ORAL_TABLET | ORAL | 2 refills | Status: DC

## 2022-06-10 NOTE — Assessment & Plan Note (Signed)
Secondary to severe gastroparesis

## 2022-06-10 NOTE — Patient Instructions (Addendum)
Please call the [facility] clinic in the morning and let them know that you have been diagnosed with severe gastroparesis and recurrent hypoglycemia.  The GI doctor doing the procedure needs to know because he will  want to postpone the colonoscopy to a later date and do the endoscopy ASAP (hopefully on Monday)   PLEASE START DRINKING A PREMIER PROTEIN SHAKE IN THE MORNING   Keep sugar tablets and orange juice available at all times   I am prescribing a motility agent called Reglan.  Do not use more than twice daily; use it before meals.   If it makes you feel worse,  stop it immediately

## 2022-06-10 NOTE — Assessment & Plan Note (Signed)
Symptoms  have been occurring intermitttently for near a year but not documented until Feb 2024 with a serum glucose of 41,  recurrent episodes documented with glucometer both fasting and post prandially,  symptoms of diaphoresis and tremulousness,  and resolution with orange juice or glucose tablets . Referral to endocrinology in progress.   Low GI high protein diet advised.

## 2022-06-10 NOTE — Assessment & Plan Note (Signed)
Starting reglan 5 mg bid .  Dietary modifications advised including pureeing food and adding a high protein low carb shake daily .  Will need to postpone colonoscopy until I can have a discussion with [facility]

## 2022-06-10 NOTE — Progress Notes (Signed)
Subjective:  Patient ID: Shelly Hood, female    DOB: 12-Jun-1955  Age: 67 y.o. MRN: [id number]  CC: The primary encounter diagnosis was Gastroparesis. 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   HPI Shelly Hood presents for follow up on nausea and vomitig workup Chief Complaint  Patient presents with   Discuss Imaging Results   Patient  is a 67 yr old female with duodenal ulcer in 2016 requiring pyloroplasty  which ultimately failed, requiring gastric resection /bypass in April [id number] complicated by abdominal abscess requiring placement of drainage tube  who  was recently sent to [facility] for a gastric emptying study for evaluation of chronic nausea and episodic  abd pain relieved by vomiting    FINDINGS: At time zero, activity is identified within the stomach.  Calculated % gastric emptying:  *4% at 30 min (Normal<30%)  *4% at 60 min (10%<Normal<70%)  *7% at 120 min (40%<Normal)  *11% at 180 min (70%<Normal)  *7% at 240 min (90%<Normal). Unexpected decreasing gastric emptying is likely secondary to overlying bowel, and not likely related to retrograde movement of enteric contents.  These values are validated for Standard SNMMI Test Meal 1  only but are probably also valid for liquid meal consisting of 237 Ensure Plus 2.   IMPRESSION:  Minimal gastric emptying over the course of 4 hours consistent with significantly delayed gastric emptying.   Patient  not seen GI in nearly a decade ,  despite multiple  referrals in 2022  by Dr Silvio Pate,  due to her self neglect as a result of caring of her dying brother.  She  is scheduled for colonoscopy at 7:45 am on Monday April 8 at  [facility] .  Has not had EGD sicne her surgery in 2016, for unclear reasons. .  She has episodes of a large knot forming in her abdomen relieved  by vomiting   2)  recent diagnosis of type 2 DM with A1c of 6.5;  has been having recurrent hypoglycemia.  Symptom started a year ago,  based on recall of symptoms.  But has been occurring more frequently with symptomatic lows  to 39 and 45 (using brother's glucose meter ) episodes are occurring  mid morning,  after lunch,  very random,  none in 5 days. . Accompanied by diaphoresis,  excessive gas and rumbling of stomach . Recent serum glucose was 41   Outpatient Medications Prior to Visit  Medication Sig Dispense Refill   acetaminophen (TYLENOL) 500 MG tablet Take 1,000 mg by mouth every 6 (six) hours as needed for mild pain or fever (pain and fever). Reported on 05/17/2015     ALPRAZolam (XANAX) 0.25 MG tablet Take 1 tablet (0.25 mg total) by mouth 2 (two) times daily as needed for anxiety. 180 tablet 0   atorvastatin (LIPITOR) 20 MG tablet Take 1 tablet (20 mg total) by mouth daily. 90 tablet 3   FLUoxetine (PROZAC) 40 MG capsule Take 1 capsule (40 mg total) by mouth daily. 90 capsule 3   omeprazole (PRILOSEC) 40 MG capsule Take 1 capsule (40 mg total) by mouth at bedtime. 30 capsule 3   Probiotic Product (PROBIOTIC ADVANCED PO) Take by mouth. Reported on 08/18/2015     No facility-administered medications prior to visit.  Review of Systems;  Patient denies headache, fevers, malaise, unintentional weight loss, skin rash, eye pain, sinus congestion and sinus pain, sore throat, dysphagia,  hemoptysis , cough, dyspnea, wheezing, chest pain, palpitations, orthopnea, edema, melena, diarrhea, constipation, flank pain, dysuria, hematuria, urinary  Frequency, nocturia, numbness, tingling, seizures,  Focal weakness, Loss of consciousness,  Tremor, insomnia, depression, anxiety, and suicidal ideation.      Objective:  BP 110/70 (BP Location: Left Arm, Patient  Position: Sitting, Cuff Size: Normal)   Pulse 66   Temp (!) 97.5 F (36.4 C)   Ht 4\' 11"  (1.499 m)   Wt 140 lb (63.5 kg)   SpO2 97%   BMI 28.28 kg/m   BP Readings from Last 3 Encounters:  06/10/22 110/70  04/15/22 122/68  09/10/21 128/78    Wt Readings from Last 3 Encounters:  06/10/22 140 lb (63.5 kg)  04/15/22 139 lb (63 kg)  09/10/21 143 lb (64.9 kg)    Physical Exam Vitals reviewed.  Constitutional:      General: She is not in acute distress.    Appearance: Normal appearance. She is normal weight. She is not ill-appearing, toxic-appearing or diaphoretic.  HENT:     Head: Normocephalic.  Eyes:     General: No scleral icterus.       Right eye: No discharge.        Left eye: No discharge.     Conjunctiva/sclera: Conjunctivae normal.  Cardiovascular:     Rate and Rhythm: Normal rate and regular rhythm.     Heart sounds: Normal heart sounds.  Pulmonary:     Effort: Pulmonary effort is normal. No respiratory distress.     Breath sounds: Normal breath sounds.  Abdominal:     General: There is distension.     Palpations: Abdomen is soft.     Tenderness: There is abdominal tenderness. There is no guarding.  Musculoskeletal:        General: Normal range of motion.  Skin:    General: Skin is warm and dry.  Neurological:     General: No focal deficit present.     Mental Status: She is alert and oriented to person, place, and time. Mental status is at baseline.  Psychiatric:        Mood and Affect: Mood normal.        Behavior: Behavior normal.        Thought Content: Thought content normal.        Judgment: Judgment normal.   Lab Results  Component Value Date   HGBA1C 6.5 04/15/2022    Lab Results  Component Value Date   CREATININE 0.76 04/15/2022   CREATININE 0.85 09/10/2021   CREATININE 0.80 07/14/2020    Lab Results  Component Value Date   WBC 9.2 04/15/2022   HGB 12.1 04/15/2022   HCT 36.7 04/15/2022   PLT 286.0 04/15/2022   GLUCOSE 41 (LL)  04/15/2022   CHOL 151 09/10/2021   TRIG 108 09/10/2021   HDL 54 09/10/2021   LDLDIRECT 91.0 07/04/2017   LDLCALC 78 09/10/2021   ALT 14 04/15/2022   AST 18 04/15/2022   NA 142 04/15/2022   K 3.8 04/15/2022   CL 106 04/15/2022   CREATININE 0.76 04/15/2022   BUN 6 04/15/2022   CO2 23 04/15/2022   TSH 1.270 06/13/2016   INR 1.20 12/02/2014   HGBA1C 6.5 04/15/2022    MM DIGITAL SCREENING BILATERAL  Result Date: 09/02/2017 CLINICAL DATA:  Screening. EXAM: DIGITAL SCREENING BILATERAL MAMMOGRAM WITH  CAD COMPARISON:  Previous exam(s). ACR Breast Density Category b: There are scattered areas of fibroglandular density. FINDINGS: There are no findings suspicious for malignancy. Images were processed with CAD. IMPRESSION: No mammographic evidence of malignancy. A result letter of this screening mammogram will be mailed directly to the patient. RECOMMENDATION: Screening mammogram in one year. (Code:SM-B-01Y) BI-RADS CATEGORY  1: Negative. Electronically Signed   By: Everlean Alstrom M.D.   On: 09/02/2017 11:26    Assessment & Plan:  .Gastroparesis -     Ambulatory referral to Endocrinology  Type 2 diabetes mellitus with other specified complication, without long-term current use of insulin -     Ambulatory referral to Endocrinology  Hypoglycemia after GI (gastrointestinal) surgery Assessment & Plan: Symptoms  have been occurring intermitttently for near a year but not documented until Feb 2024 with a serum glucose of 41,  recurrent episodes documented with glucometer both fasting and post prandially,  symptoms of diaphoresis and tremulousness,  and resolution with orange juice or glucose tablets . Referral to endocrinology in progress.   Low GI high protein diet advised.   Orders: -     Ambulatory referral to Endocrinology  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 Assessment & Plan: Starting reglan 5 mg bid .  Dietary modifications advised including pureeing food and adding a high  protein low carb shake daily .  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  She was referred to  [facility] gastroenterology in 2022 but deferred evaluation secondary to caregiver issues .  Referral made at last visit  appt is in August.  Her upcoming colonoscopy will need to be postponed for EGD    Nausea and vomiting, unspecified vomiting type Assessment & Plan: Secondary to severe gastroparesis    Other orders -     Metoclopramide HCl; 2 times daily before meals  Dispense: 60 tablet; Refill: 2     I provided 43 minutes of face-to-face time during this encounter reviewing patient's last visit with me, patient's  most recent visit with  me, prior  surgical and non surgical procedures, previous  labs and imaging studies, counseling on currently addressed issues,  and post visit ordering to diagnostics and therapeutics .   Follow-up: No follow-ups on file.   Crecencio Mc, MD

## 2022-06-10 NOTE — Assessment & Plan Note (Signed)
Current symptoms of periodic epigastric "knot" relieved by vomiting supported by severe gastroparesis suggest she may have a recurrence of partial outlet obstruction .  She was referred to  [facility] gastroenterology in 2022 but deferred evaluation secondary to caregiver issues .  Referral made at last visit  appt is in August.  Her upcoming colonoscopy will need to be postponed for EGD

## 2022-06-11 ENCOUNTER — Other Ambulatory Visit: Payer: Self-pay | Admitting: Internal Medicine

## 2022-06-11 DIAGNOSIS — K3184 Gastroparesis: Secondary | ICD-10-CM

## 2022-06-11 DIAGNOSIS — K912 Postsurgical malabsorption, not elsewhere classified: Secondary | ICD-10-CM

## 2022-06-11 DIAGNOSIS — K311 Adult hypertrophic pyloric stenosis: Secondary | ICD-10-CM

## 2022-06-11 DIAGNOSIS — Z903 Acquired absence of stomach [part of]: Secondary | ICD-10-CM

## 2022-06-17 ENCOUNTER — Telehealth: Payer: Self-pay | Admitting: Internal Medicine

## 2022-06-17 DIAGNOSIS — K3184 Gastroparesis: Secondary | ICD-10-CM | POA: Diagnosis not present

## 2022-06-17 DIAGNOSIS — R112 Nausea with vomiting, unspecified: Secondary | ICD-10-CM | POA: Diagnosis not present

## 2022-06-17 NOTE — Telephone Encounter (Signed)
Patient called and wanted to know what she needs to do with the [facility] 3 that Dr Darrick Huntsman put on her are. It runs out next Monday 06/24/2022. Dr Darrick Huntsman did not give her a prescription for the [facility] 3.  Also, patient stated that Dr Darrick Huntsman  also said she would take her on as a patient.

## 2022-06-17 NOTE — Telephone Encounter (Signed)
Did you accept as anew pt? Does pt need to schedule a follow up appt after her libre sensor expires?

## 2022-06-18 DIAGNOSIS — K9589 Other complications of other bariatric procedure: Secondary | ICD-10-CM | POA: Diagnosis not present

## 2022-06-18 DIAGNOSIS — K3184 Gastroparesis: Secondary | ICD-10-CM | POA: Diagnosis not present

## 2022-06-18 DIAGNOSIS — F32A Depression, unspecified: Secondary | ICD-10-CM | POA: Diagnosis not present

## 2022-06-18 DIAGNOSIS — R112 Nausea with vomiting, unspecified: Secondary | ICD-10-CM | POA: Diagnosis not present

## 2022-06-18 DIAGNOSIS — K9189 Other postprocedural complications and disorders of digestive system: Secondary | ICD-10-CM | POA: Diagnosis not present

## 2022-06-18 DIAGNOSIS — Z885 Allergy status to narcotic agent status: Secondary | ICD-10-CM | POA: Diagnosis not present

## 2022-06-18 DIAGNOSIS — F410 Panic disorder [episodic paroxysmal anxiety] without agoraphobia: Secondary | ICD-10-CM | POA: Diagnosis not present

## 2022-06-18 DIAGNOSIS — Z9884 Bariatric surgery status: Secondary | ICD-10-CM | POA: Diagnosis not present

## 2022-06-18 DIAGNOSIS — W44F3XA Food entering into or through a natural orifice, initial encounter: Secondary | ICD-10-CM | POA: Diagnosis not present

## 2022-06-18 DIAGNOSIS — K219 Gastro-esophageal reflux disease without esophagitis: Secondary | ICD-10-CM | POA: Diagnosis not present

## 2022-06-18 DIAGNOSIS — K311 Adult hypertrophic pyloric stenosis: Secondary | ICD-10-CM | POA: Diagnosis not present

## 2022-06-18 DIAGNOSIS — T182XXA Foreign body in stomach, initial encounter: Secondary | ICD-10-CM | POA: Diagnosis not present

## 2022-06-18 DIAGNOSIS — Y832 Surgical operation with anastomosis, bypass or graft as the cause of abnormal reaction of the patient, or of later complication, without mention of misadventure at the time of the procedure: Secondary | ICD-10-CM | POA: Diagnosis not present

## 2022-06-18 DIAGNOSIS — E7800 Pure hypercholesterolemia, unspecified: Secondary | ICD-10-CM | POA: Diagnosis not present

## 2022-06-18 DIAGNOSIS — Z886 Allergy status to analgesic agent status: Secondary | ICD-10-CM | POA: Diagnosis not present

## 2022-06-18 DIAGNOSIS — F17200 Nicotine dependence, unspecified, uncomplicated: Secondary | ICD-10-CM | POA: Diagnosis not present

## 2022-06-18 NOTE — Telephone Encounter (Signed)
Called pt to let her know that we will need to link her phone to our office so we can print off the results. Pt stated that she had a procedure this morning and the libre sensor got ripped off. I was unable to link her phone to get the results from old sensor so pt came by office and we placed a new one. Pt was shown how to link her phone to our office once her new sensor was ready. Will print off data in two weeks.

## 2022-06-19 ENCOUNTER — Encounter: Payer: BC Managed Care – PPO | Admitting: Nurse Practitioner

## 2022-06-21 NOTE — Telephone Encounter (Signed)
I was able to get pt's phone connected to our office and printed the report off. I have placed it in your yellow results folder.

## 2022-07-02 ENCOUNTER — Telehealth: Payer: Self-pay | Admitting: Internal Medicine

## 2022-07-02 NOTE — Telephone Encounter (Signed)
Per pt's Josephine Igo report pt has had 9 hypo events in the last 13 days. I have printed off the report and placed in yellow results folder.

## 2022-07-02 NOTE — Telephone Encounter (Signed)
Pt called stating her blood sugar keeps dropping and she does not know what to do. Pt stated it has been as low as the 50s sent to access nurse

## 2022-07-03 ENCOUNTER — Telehealth: Payer: Self-pay

## 2022-07-03 ENCOUNTER — Encounter: Payer: Self-pay | Admitting: Internal Medicine

## 2022-07-03 ENCOUNTER — Other Ambulatory Visit: Payer: Self-pay | Admitting: Internal Medicine

## 2022-07-03 DIAGNOSIS — K3184 Gastroparesis: Secondary | ICD-10-CM

## 2022-07-03 DIAGNOSIS — K912 Postsurgical malabsorption, not elsewhere classified: Secondary | ICD-10-CM

## 2022-07-03 NOTE — Telephone Encounter (Signed)
Pt is aware and gave a verbal understanding.  

## 2022-07-03 NOTE — Telephone Encounter (Signed)
Elnita Maxwell from [facility] called to let us know that they do not treat hypoglycemia and gastroparesis, she also didn't meet criteria because her A1c is less than 7.

## 2022-07-03 NOTE — Telephone Encounter (Signed)
Pt stated that she is eating the way you recommended. She stated that one minute her BS will be 117 and then suddenly drop down in the 50s. She stated that she keeps orange juice on her, she drinks a protein shake in the morning and eats yogurt through out the day as well.

## 2022-07-03 NOTE — Telephone Encounter (Signed)
Pt is aware of mychart message

## 2022-07-05 ENCOUNTER — Other Ambulatory Visit (INDEPENDENT_AMBULATORY_CARE_PROVIDER_SITE_OTHER): Payer: BC Managed Care – PPO

## 2022-07-05 DIAGNOSIS — K912 Postsurgical malabsorption, not elsewhere classified: Secondary | ICD-10-CM

## 2022-07-08 LAB — CORTISOL-AM, BLOOD: Cortisol - AM: 17.5 ug/dL

## 2022-07-09 ENCOUNTER — Ambulatory Visit: Payer: BC Managed Care – PPO | Admitting: Internal Medicine

## 2022-07-09 ENCOUNTER — Encounter: Payer: Self-pay | Admitting: Internal Medicine

## 2022-07-09 VITALS — BP 118/66 | HR 60 | Temp 98.0°F | Ht 59.0 in | Wt 140.2 lb

## 2022-07-09 DIAGNOSIS — K912 Postsurgical malabsorption, not elsewhere classified: Secondary | ICD-10-CM | POA: Diagnosis not present

## 2022-07-09 DIAGNOSIS — Z794 Long term (current) use of insulin: Secondary | ICD-10-CM | POA: Diagnosis not present

## 2022-07-09 DIAGNOSIS — K911 Postgastric surgery syndromes: Secondary | ICD-10-CM | POA: Diagnosis not present

## 2022-07-09 DIAGNOSIS — K3184 Gastroparesis: Secondary | ICD-10-CM | POA: Diagnosis not present

## 2022-07-09 MED ORDER — FREESTYLE LIBRE 3 SENSOR MISC
11 refills | Status: DC
Start: 2022-07-09 — End: 2022-07-12

## 2022-07-09 MED ORDER — OMEPRAZOLE 40 MG PO CPDR: 40.0000 mg | DELAYED_RELEASE_CAPSULE | Freq: Every day | ORAL | 3 refills | Status: DC

## 2022-07-09 NOTE — Progress Notes (Signed)
Subjective:  Patient ID: Shelly Hood, female    DOB: 13-Jan-1956  Age: 67 y.o. MRN: [id number]  CC: The primary encounter diagnosis was Encounter for long-term (current) use of insulin (HCC). Diagnoses of 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 and Hypoglycemia after GI (gastrointestinal) surgery were also pertinent to this visit.   HPI Shelly Hood presents for  Chief Complaint  Patient presents with   Medical Management of Chronic Issues   Follow up on gastroparesis, severe . CONFIRMED WITH EGD DONE AT [facility] AFTER A 12 HOUR FAST.  NO PYLORIC OUTLET OBSTRUCTION.   Tried taking Taking 5 mg reglan twce daily  but could not tolerate it due to excessive sedation.  Does not get hungry,  but has had no change in weight. BMI is above normal Body mass index is 28.32 kg/m.  Eating high protein drink,  yogurt,  told to avoid fruit but craves fruit so she eats it anyway. .  Has days where her abdomen feels "grumbly" m usually when her blood sugar drops     Recurrent hypoglycemia.  Patient has been wearing CBG monitor since March 30 and her average glucose has been 119 to 114.  I have downloaded and reviewed the data from patient's continuous blood glucose monitor FOR THE LAST 4 WEEKS  Patient's  sugars have been  IN RANGE   94 to 96   % OF THE TIME,   BELOW RANGE    2 % of the time.  And ABOVE RANGE  3 to 5  % OF THE TIME .  The lowest blood sugar recorded has been 52 and occurred at 1 pm .  Most lows are occurring ROUND 8:30 AM ,  12:30 PM TO 1 PM,  7TP 8 PM  AMD AND MIDNIGHT  .  She does not have lows when she is working outside during the day.    Diet reviewed: she has not been combining protein with her fruit or italian ice snacks. The drops in blood sugar coincide with these snacks       Outpatient Medications Prior to Visit  Medication Sig Dispense Refill   acetaminophen (TYLENOL) 500 MG tablet Take 1,000 mg by mouth every 6 (six) hours as needed for mild pain or fever (pain and  fever). Reported on 05/17/2015     ALPRAZolam (XANAX) 0.25 MG tablet Take 1 tablet (0.25 mg total) by mouth 2 (two) times daily as needed for anxiety. 180 tablet 0   atorvastatin (LIPITOR) 20 MG tablet Take 1 tablet (20 mg total) by mouth daily. 90 tablet 3   FLUoxetine (PROZAC) 40 MG capsule Take 1 capsule (40 mg total) by mouth daily. 90 capsule 3   metoCLOPramide (REGLAN) 5 MG tablet 2 times daily before meals 60 tablet 2   Probiotic Product (PROBIOTIC ADVANCED PO) Take by mouth. Reported on 08/18/2015     omeprazole (PRILOSEC) 40 MG capsule Take 1 capsule (40 mg total) by mouth at bedtime. 30 capsule 3   No facility-administered medications prior to visit.    Review of Systems;  Patient denies headache, fevers, malaise, unintentional weight loss, skin rash, eye pain, sinus congestion and sinus pain, sore throat, dysphagia,  hemoptysis , cough, dyspnea, wheezing, chest pain, palpitations, orthopnea, edema, abdominal pain, nausea, melena, diarrhea, constipation, flank pain, dysuria, hematuria, urinary  Frequency, nocturia, numbness, tingling, seizures,  Focal weakness, Loss of consciousness,  Tremor, insomnia, depression, anxiety, and suicidal ideation.      Objective:  BP 118/66   Pulse  60   Temp 98 F (36.7 C) (Oral)   Ht 4\' 11"  (1.499 m)   Wt 140 lb 3.2 oz (63.6 kg)   SpO2 97%   BMI 28.32 kg/m   BP Readings from Last 3 Encounters:  07/09/22 118/66  06/10/22 110/70  04/15/22 122/68    Wt Readings from Last 3 Encounters:  07/09/22 140 lb 3.2 oz (63.6 kg)  06/10/22 140 lb (63.5 kg)  04/15/22 139 lb (63 kg)    Physical Exam Vitals reviewed.  Constitutional:      General: She is not in acute distress.    Appearance: Normal appearance. She is normal weight. She is not ill-appearing, toxic-appearing or diaphoretic.  HENT:     Head: Normocephalic.  Eyes:     General: No scleral icterus.       Right eye: No discharge.        Left eye: No discharge.     Conjunctiva/sclera:  Conjunctivae normal.  Cardiovascular:     Rate and Rhythm: Normal rate and regular rhythm.     Heart sounds: Normal heart sounds.  Pulmonary:     Effort: Pulmonary effort is normal. No respiratory distress.     Breath sounds: Normal breath sounds.  Abdominal:     General: Bowel sounds are normal.     Palpations: Abdomen is soft.  Musculoskeletal:        General: Normal range of motion.  Skin:    General: Skin is warm and dry.  Neurological:     General: No focal deficit present.     Mental Status: She is alert and oriented to person, place, and time. Mental status is at baseline.  Psychiatric:        Mood and Affect: Mood normal.        Behavior: Behavior normal.        Thought Content: Thought content normal.        Judgment: Judgment normal.   Lab Results  Component Value Date   HGBA1C 6.5 04/15/2022    Lab Results  Component Value Date   CREATININE 0.76 04/15/2022   CREATININE 0.85 09/10/2021   CREATININE 0.80 07/14/2020    Lab Results  Component Value Date   WBC 9.2 04/15/2022   HGB 12.1 04/15/2022   HCT 36.7 04/15/2022   PLT 286.0 04/15/2022   GLUCOSE 41 (LL) 04/15/2022   CHOL 151 09/10/2021   TRIG 108 09/10/2021   HDL 54 09/10/2021   LDLDIRECT 91.0 07/04/2017   LDLCALC 78 09/10/2021   ALT 14 04/15/2022   AST 18 04/15/2022   NA 142 04/15/2022   K 3.8 04/15/2022   CL 106 04/15/2022   CREATININE 0.76 04/15/2022   BUN 6 04/15/2022   CO2 23 04/15/2022   TSH 1.270 06/13/2016   INR 1.20 12/02/2014   HGBA1C 6.5 04/15/2022    MM DIGITAL SCREENING BILATERAL  Result Date: 09/02/2017 CLINICAL DATA:  Screening. EXAM: DIGITAL SCREENING BILATERAL MAMMOGRAM WITH CAD COMPARISON:  Previous exam(s). ACR Breast Density Category b: There are scattered areas of fibroglandular density. FINDINGS: There are no findings suspicious for malignancy. Images were processed with CAD. IMPRESSION: No mammographic evidence of malignancy. A result letter of this screening mammogram  will be mailed directly to the patient. RECOMMENDATION: Screening mammogram in one year. (Code:SM-B-01Y) BI-RADS CATEGORY  1: Negative. Electronically Signed   By: Edwin Cap M.D.   On: 09/02/2017 11:26    Assessment & Plan:  .Encounter for long-term (current) use of insulin (HCC) -  Microalbumin / creatinine urine ratio -     FreeStyle Libre 3 Sensor; Place 1 sensor on the skin every 14 days. Use to check glucose continuously  Dispense: 2 each; Refill: 11  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 Assessment & Plan: She did not tolerate 5mg  reglan dose.  Eating a varied diet without diarrhea constipation or weight loss .  Encouraged to try 2.5 mg dose    Hypoglycemia after GI (gastrointestinal) surgery Assessment & Plan: Symptoms  have been occurring intermitttently for near a year but not documented until Feb 2024 with a serum glucose of 41,  recurrent episodes documented with glucometer occurring mostly  post prandially, wihth  symptoms of diaphoresis and tremulousness,  Low GI high protein diet reviewed in detail; emphasized need to combine protein with surgars and add starches    Other orders -     Omeprazole; Take 1 capsule (40 mg total) by mouth at bedtime.  Dispense: 90 capsule; Refill: 3     I provided  40 minutes of face-to-face time during this encounter reviewing patient's last visit with me, patient's  most recent  EGD ,  previous  labs and blood sugars for the last 4 weeks , counseling on currently addressed issues,  and post visit ordering to diagnostics and therapeutics .   Follow-up: No follow-ups on file.   Sherlene Shams, MD

## 2022-07-09 NOTE — Assessment & Plan Note (Signed)
She did not tolerate 5mg  reglan dose.  Eating a varied diet without diarrhea constipation or weight loss .  Encouraged to try 2.5 mg dose

## 2022-07-09 NOTE — Patient Instructions (Addendum)
  YOU MUST ADD PROTEIN TO EVERY SNACK,   ESPECIALLY ITALIAN ICES AND FRUIT!  TRY ADDING BREAD AND PASTA TO THE MORNING BEFORE YOUR SHAKE,  AND A GRAHAM CRACKER WITH PEANUT BUTTER WITH YOUR ITALIAN ICE  Veggies Made Great ! Makes a low carb spinach & egg white frittata  that you can MICROWAVE AND add to your fruit   FRUIT AND VEGETABLES SUPPLEMENT MAY BE  A GOOD IDEA

## 2022-07-09 NOTE — Assessment & Plan Note (Signed)
Symptoms  have been occurring intermitttently for near a year but not documented until Feb 2024 with a serum glucose of 41,  recurrent episodes documented with glucometer occurring mostly  post prandially, wihth  symptoms of diaphoresis and tremulousness,  Low GI high protein diet reviewed in detail; emphasized need to combine protein with surgars and add starches

## 2022-07-10 LAB — INSULIN, FREE (BIOACTIVE): Insulin, Free: 31.4 u[IU]/mL — ABNORMAL HIGH (ref 1.5–14.9)

## 2022-07-11 ENCOUNTER — Encounter: Payer: Self-pay | Admitting: Internal Medicine

## 2022-07-11 DIAGNOSIS — Z794 Long term (current) use of insulin: Secondary | ICD-10-CM

## 2022-07-12 LAB — INSULIN-LIKE GROWTH FACTOR
IGF-I, LC/MS: 63 ng/mL (ref 41–279)
Z-Score (Female): -1.3 SD (ref ?–2.0)

## 2022-07-12 MED ORDER — FREESTYLE LIBRE 3 SENSOR MISC
11 refills | Status: DC
Start: 2022-07-12 — End: 2023-01-14

## 2022-07-14 ENCOUNTER — Encounter: Payer: Self-pay | Admitting: Internal Medicine

## 2022-07-14 MED ORDER — ACARBOSE 25 MG PO TABS: 25.0000 mg | ORAL_TABLET | Freq: Three times a day (TID) | ORAL | 2 refills | Status: DC

## 2022-07-14 NOTE — Addendum Note (Signed)
Addended by: Sherlene Shams on: 07/14/2022 01:06 AM   Modules accepted: Orders

## 2022-09-17 ENCOUNTER — Other Ambulatory Visit: Payer: Self-pay | Admitting: Internal Medicine

## 2022-09-18 ENCOUNTER — Other Ambulatory Visit: Payer: Self-pay | Admitting: Internal Medicine

## 2022-09-18 MED ORDER — ALPRAZOLAM 0.25 MG PO TABS: 0.2500 mg | ORAL_TABLET | Freq: Two times a day (BID) | ORAL | 1 refills | Status: DC | PRN

## 2022-09-18 NOTE — Telephone Encounter (Signed)
Name of Medication: Xanax Name of Pharmacy: [facility]  Last Fill or Written Date and Quantity: 05/16/22 #180 tab/ 0 refills Last Office Visit and Type: 07/09/22 f/u Next Office Visit and Type: none scheduled  Prozac last filled 09/10/21 #90 caps with 3 refills

## 2022-09-19 ENCOUNTER — Encounter: Payer: Self-pay | Admitting: Internal Medicine

## 2022-09-30 ENCOUNTER — Ambulatory Visit: Payer: BC Managed Care – PPO

## 2022-10-16 ENCOUNTER — Encounter: Payer: BC Managed Care – PPO | Attending: Internal Medicine | Admitting: Dietician

## 2022-10-16 ENCOUNTER — Encounter: Payer: Self-pay | Admitting: Dietician

## 2022-10-16 VITALS — Ht 59.0 in | Wt 138.4 lb

## 2022-10-16 DIAGNOSIS — K911 Postgastric surgery syndromes: Secondary | ICD-10-CM

## 2022-10-16 DIAGNOSIS — E161 Other hypoglycemia: Secondary | ICD-10-CM | POA: Diagnosis not present

## 2022-10-16 DIAGNOSIS — K3184 Gastroparesis: Secondary | ICD-10-CM

## 2022-10-16 NOTE — Patient Instructions (Signed)
Plan to eat something every 4 hours during the day. Eat at least 4 times a day.  Include a lean/ low fat protein food + a grain or fruit each meal or snack.  Keep portions of butter, sour cream, salad dressing and especially bacon very small.  Consider choosing 1/2-caffeine coffee or gradually reduce the amount of coffee each morning.

## 2022-10-16 NOTE — Progress Notes (Signed)
Medical Nutrition Therapy: Visit start time: 0920  end time: 1020  Assessment:   Referral Diagnosis: hypoglycemia, gastroparesis Other medical history/ diagnoses: history of duodenal ulcer, RnY surgery 2016 Psychosocial issues/ stress concerns: none  Medications, supplements: reconciled list in medical record   Current weight: 138.4lbs Height: 4'11" BMI: 27.95   Progress and evaluation:  Patient reports frequent fluctuations in BGs, hypoglycemia several times most days, with some high BGs occasionally >200, briefly.  She feels coffee in am causes spike in BG. Drinks with creamer and sweet n low  She follows erratic eating pattern due to lack of hunger and effort to prevent gastroparesis symptoms. Food allergies: none Special diet practices: none Patient seeks help with improving BG control and gastroparesis symptoms.    Dietary Intake:  Usual eating pattern includes 1-2 meals and 1 snacks per day. Dining out frequency: ? meals per week. Who plans meals/ buys groceries? self Who prepares meals? self  Breakfast: skips, not hungry Snack: none Lunch: fruit ie watermelon; crackers ie cheez its Snack: none Supper: 5-5:30pm simple meal BLT and chips; 8/5 salad and baked potato Snack: italian ice mango Beverages: mostly water, some iced tea; occ ginger ale when nauseated  Physical activity: gardening daily   Intervention:   Nutrition Care Education:   Basic nutrition: basic food groups; appropriate nutrient balance; appropriate meal and snack schedule; general nutrition guidelines    hypoglycemia:  importance of eating at regular intervals, controlling carb intake and including protein source with each meal or snack; proper treatment steps for treating low BGs.  Gastroparesis: eating small, frequent meals and snacks, limiting fiber and fat, appropriate food choices, decreasing caffeine, staying upright for some time after eating  Other intervention notes: Patient voices  readiness for change to relieve symptoms of low BGs and gastroparesis. Established goals for change with input from patient.  Patient declined scheduling follow up at this time, will do so later if needed.   Nutritional Diagnosis:  [place]-1.4 Altered GI function As related to gastroparesis.  As evidenced by GI symptoms and delayed emptying, episodes of hypoglycemia after eating.   Education Materials given:  Gastroparesis Nutrition Therapy (NCM) Plate Planner with food lists, sample meal pattern Sample menus Visit summary with goals/ instructions   Learner/ who was taught:  Patient   Level of understanding: Verbalizes/ demonstrates competency  Demonstrated degree of understanding via:   Teach back Learning barriers: None   Willingness to learn/ readiness for change: Acceptance, ready for change   Monitoring and Evaluation:  Dietary intake, exercise, BG control, GI symptoms, and body weight      follow up: prn

## 2022-10-23 DIAGNOSIS — R112 Nausea with vomiting, unspecified: Secondary | ICD-10-CM | POA: Diagnosis not present

## 2022-10-23 DIAGNOSIS — K3184 Gastroparesis: Secondary | ICD-10-CM | POA: Diagnosis not present

## 2022-10-23 DIAGNOSIS — E162 Hypoglycemia, unspecified: Secondary | ICD-10-CM | POA: Diagnosis not present

## 2022-10-23 DIAGNOSIS — Z1211 Encounter for screening for malignant neoplasm of colon: Secondary | ICD-10-CM | POA: Diagnosis not present

## 2022-10-23 DIAGNOSIS — Z8719 Personal history of other diseases of the digestive system: Secondary | ICD-10-CM | POA: Diagnosis not present

## 2022-10-24 ENCOUNTER — Encounter (INDEPENDENT_AMBULATORY_CARE_PROVIDER_SITE_OTHER): Payer: Self-pay

## 2022-10-31 ENCOUNTER — Encounter: Payer: Self-pay | Admitting: Internal Medicine

## 2022-11-01 ENCOUNTER — Other Ambulatory Visit: Payer: Self-pay

## 2022-11-01 MED ORDER — ATORVASTATIN CALCIUM 20 MG PO TABS: 20.0000 mg | ORAL_TABLET | Freq: Every day | ORAL | 3 refills | Status: DC

## 2022-11-01 MED ORDER — ALPRAZOLAM 0.25 MG PO TABS: 0.2500 mg | ORAL_TABLET | Freq: Two times a day (BID) | ORAL | 2 refills | Status: DC | PRN

## 2022-11-13 ENCOUNTER — Ambulatory Visit: Payer: BC Managed Care – PPO | Admitting: Family Medicine

## 2022-11-15 DIAGNOSIS — K5730 Diverticulosis of large intestine without perforation or abscess without bleeding: Secondary | ICD-10-CM | POA: Diagnosis not present

## 2022-11-15 DIAGNOSIS — Z1211 Encounter for screening for malignant neoplasm of colon: Secondary | ICD-10-CM | POA: Diagnosis not present

## 2022-11-15 LAB — HM COLONOSCOPY

## 2022-11-25 ENCOUNTER — Encounter: Payer: Self-pay | Admitting: Internal Medicine

## 2022-11-25 ENCOUNTER — Other Ambulatory Visit (HOSPITAL_COMMUNITY)
Admission: RE | Admit: 2022-11-25 | Discharge: 2022-11-25 | Disposition: A | Discharge status: Home / Self Care | Payer: BC Managed Care – PPO | Source: Ambulatory Visit | Attending: Internal Medicine | Admitting: Internal Medicine

## 2022-11-25 ENCOUNTER — Ambulatory Visit (INDEPENDENT_AMBULATORY_CARE_PROVIDER_SITE_OTHER): Payer: BC Managed Care – PPO | Admitting: Internal Medicine

## 2022-11-25 VITALS — BP 114/70 | HR 65 | Ht 59.0 in | Wt 139.8 lb

## 2022-11-25 DIAGNOSIS — K911 Postgastric surgery syndromes: Secondary | ICD-10-CM

## 2022-11-25 DIAGNOSIS — Z1231 Encounter for screening mammogram for malignant neoplasm of breast: Secondary | ICD-10-CM

## 2022-11-25 DIAGNOSIS — E161 Other hypoglycemia: Secondary | ICD-10-CM | POA: Diagnosis not present

## 2022-11-25 DIAGNOSIS — E785 Hyperlipidemia, unspecified: Secondary | ICD-10-CM | POA: Diagnosis not present

## 2022-11-25 DIAGNOSIS — Z124 Encounter for screening for malignant neoplasm of cervix: Secondary | ICD-10-CM | POA: Insufficient documentation

## 2022-11-25 DIAGNOSIS — D071 Carcinoma in situ of vulva: Secondary | ICD-10-CM

## 2022-11-25 DIAGNOSIS — Z0000 Encounter for general adult medical examination without abnormal findings: Secondary | ICD-10-CM | POA: Diagnosis not present

## 2022-11-25 DIAGNOSIS — R5383 Other fatigue: Secondary | ICD-10-CM

## 2022-11-25 DIAGNOSIS — R7301 Impaired fasting glucose: Secondary | ICD-10-CM | POA: Diagnosis not present

## 2022-11-25 DIAGNOSIS — Z23 Encounter for immunization: Secondary | ICD-10-CM

## 2022-11-25 DIAGNOSIS — K3184 Gastroparesis: Secondary | ICD-10-CM

## 2022-11-25 DIAGNOSIS — Z0001 Encounter for general adult medical examination with abnormal findings: Secondary | ICD-10-CM

## 2022-11-25 NOTE — Patient Instructions (Signed)
Good to see you!  We did your PAP smear today  . If it is abnormal I will send you back to Dr Marice Potter

## 2022-11-25 NOTE — Assessment & Plan Note (Signed)
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 .  Follow up was hindered by COVID pandemic.  Doing it today

## 2022-11-25 NOTE — Progress Notes (Unsigned)
Patient ID: Shelly Hood, female    DOB: 1955/08/11  Age: 67 y.o. MRN: [id number]  The patient is here for annual preventive examination and management of other chronic and acute problems.   The risk factors are reflected in the social history.   The roster of all physicians providing medical care to patient - is listed in the Snapshot section of the chart.   Activities of daily living:  The patient is 100% independent in all ADLs: dressing, toileting, feeding as well as independent mobility   Home safety : The patient has smoke detectors in the home. They wear seatbelts.  There are no unsecured firearms at home. There is no violence in the home.    There is no risks for hepatitis, STDs or HIV. There is no   history of blood transfusion. They have no travel history to infectious disease endemic areas of the world.   The patient has seen their dentist in the last six month. They have seen their eye doctor in the last year. The patinet  denies slight hearing difficulty with regard to whispered voices and some television programs.  They have deferred audiologic testing in the last year.  They do not  have excessive sun exposure. Discussed the need for sun protection: hats, long sleeves and use of sunscreen if there is significant sun exposure.    Diet: the importance of a healthy diet is discussed. They do have a healthy diet.   The benefits of regular aerobic exercise were discussed. The patient  exercises  3 to 5 days per week  for  60 minutes.    Depression screen: there are no signs or vegative symptoms of depression- irritability, change in appetite, anhedonia, sadness/tearfullness.   The following portions of the patient's history were reviewed and updated as appropriate: allergies, current medications, past family history, past medical history,  past surgical history, past social history  and problem list.   Visual acuity was not assessed per patient preference since the patient has  regular follow up with an  ophthalmologist. Hearing and body mass index were assessed and reviewed.    During the course of the visit the patient was educated and counseled about appropriate screening and preventive services including : fall prevention , diabetes screening, nutrition counseling, colorectal cancer screening, and recommended immunizations.    Chief Complaint:  Recurrent  hypoglycemia : she continues to have episodes of  symptomatic hypoglycemia ; her CBG monitor has recorded blood sugars in the 50's.  She carries candy with her to manage the lows.  She is awaiting Endocrinology appointment at [facility] which has taken 6 months to procure.     Gastroparesis:  significant,  by gastric emptying study: she  Did not tolerate an empiric reglan trial  Colon ca screening:  colonoscopy was done Sept 6 but was  incomplete due to retained stool throughout the colon. Repeat is planned for one year with one week of bowel clean out advised by GI.       Review of Symptoms  Patient denies headache, fevers, malaise, unintentional weight loss, skin rash, eye pain, sinus congestion and sinus pain, sore throat, dysphagia,  hemoptysis , cough, dyspnea, wheezing, chest pain, palpitations, orthopnea, edema, abdominal pain, nausea, melena, diarrhea, constipation, flank pain, dysuria, hematuria, urinary  Frequency, nocturia, numbness, tingling, seizures,  Focal weakness, Loss of consciousness,  Tremor, insomnia, depression, anxiety, and suicidal ideation.    Physical Exam:  BP 114/70   Pulse 65   Ht 4\' 11"  (  1.499 m)   Wt 139 lb 12.8 oz (63.4 kg)   SpO2 99%   BMI 28.24 kg/m    Physical Exam Vitals reviewed.  Constitutional:      General: She is not in acute distress.    Appearance: Normal appearance. She is well-developed and normal weight. She is not ill-appearing, toxic-appearing or diaphoretic.  HENT:     Head: Normocephalic.     Right Ear: Tympanic membrane, ear canal and external ear normal.  There is no impacted cerumen.     Left Ear: Tympanic membrane, ear canal and external ear normal. There is no impacted cerumen.     Nose: Nose normal.     Mouth/Throat:     Mouth: Mucous membranes are moist.     Pharynx: Oropharynx is clear.  Eyes:     General: No scleral icterus.       Right eye: No discharge.        Left eye: No discharge.     Conjunctiva/sclera: Conjunctivae normal.     Pupils: Pupils are equal, round, and reactive to light.  Neck:     Thyroid: No thyromegaly.     Vascular: No carotid bruit or JVD.  Cardiovascular:     Rate and Rhythm: Normal rate and regular rhythm.     Heart sounds: Normal heart sounds.  Pulmonary:     Effort: Pulmonary effort is normal. No respiratory distress.     Breath sounds: Normal breath sounds.  Chest:  Breasts:    Breasts are symmetrical.     Right: Normal. No swelling, inverted nipple, mass, nipple discharge, skin change or tenderness.     Left: Normal. No swelling, inverted nipple, mass, nipple discharge, skin change or tenderness.  Abdominal:     General: Bowel sounds are normal.     Palpations: Abdomen is soft. There is no mass.     Tenderness: There is no abdominal tenderness. There is no guarding or rebound.     Hernia: There is no hernia in the left inguinal area or right inguinal area.  Genitourinary:    Exam position: Lithotomy position.     Pubic Area: No rash or pubic lice.      Labia:        Right: No rash, tenderness, lesion or injury.        Left: No rash, tenderness, lesion or injury.      Vagina: Normal.     Cervix: Normal.     Uterus: Normal.      Adnexa: Right adnexa normal and left adnexa normal.  Musculoskeletal:        General: Normal range of motion.     Cervical back: Normal range of motion and neck supple.  Lymphadenopathy:     Cervical: No cervical adenopathy.     Upper Body:     Right upper body: No supraclavicular, axillary or pectoral adenopathy.     Left upper body: No supraclavicular,  axillary or pectoral adenopathy.     Lower Body: No right inguinal adenopathy. No left inguinal adenopathy.  Skin:    General: Skin is warm and dry.  Neurological:     General: No focal deficit present.     Mental Status: She is alert and oriented to person, place, and time. Mental status is at baseline.  Psychiatric:        Mood and Affect: Mood normal.        Behavior: Behavior normal.        Thought Content: Thought  content normal.        Judgment: Judgment normal.     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 .  Follow up was hindered by COVID pandemic.  Doing it today    Need for influenza vaccination -     Flu vaccine trivalent PF, 6mos and older(Flulaval,Afluria,Fluarix,Fluzone)  Hypoglycemia, endogenous hyperinsulinemia Assessment & Plan: Etiology unclear but likely related to gastroparesis Secondary to prior abdominal surgery for h/o pyloric stricture.  Awaiting Endocrinology evaluation .  She continues to wear a CGB monitor to record events.  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 She did not tolerate 5mg  reglan dose.  Eating a varied diet without diarrhea constipation or weight loss .     Encounter for general adult medical examination with abnormal findings Assessment & Plan: age appropriate education and counseling updated, referrals for preventative services and immunizations addressed, dietary and smoking counseling addressed, most recent labs reviewed.  I have personally reviewed and have noted:   1) the patient's medical and social history 2) The pt's use of alcohol, tobacco, and illicit drugs 3) The patient's current medications and supplements 4) Functional ability including ADL's, fall risk, home safety risk, hearing and visual impairment 5) Diet and physical activities 6) Evidence for depression or mood disorder 7) The patient's height, weight, and BMI have been recorded in the chart  I have made referrals, and provided counseling and education based on review of the above      No follow-ups on file.  Sherlene Shams, MD

## 2022-11-26 LAB — HEMOGLOBIN A1C: Hgb A1c MFr Bld: 6.6 % — ABNORMAL HIGH (ref 4.6–6.5)

## 2022-11-26 LAB — COMPREHENSIVE METABOLIC PANEL
ALT: 15 U/L (ref 0–35)
AST: 19 U/L (ref 0–37)
Albumin: 3.9 g/dL (ref 3.5–5.2)
Alkaline Phosphatase: 77 U/L (ref 39–117)
BUN: 11 mg/dL (ref 6–23)
CO2: 29 meq/L (ref 19–32)
Calcium: 9.2 mg/dL (ref 8.4–10.5)
Chloride: 103 meq/L (ref 96–112)
Creatinine, Ser: 0.91 mg/dL (ref 0.40–1.20)
GFR: 66.51 mL/min (ref >60.00)
Glucose, Bld: 72 mg/dL (ref 70–99)
Potassium: 4 meq/L (ref 3.5–5.1)
Sodium: 139 meq/L (ref 135–145)
Total Bilirubin: 0.3 mg/dL (ref 0.2–1.2)
Total Protein: 6.7 g/dL (ref 6.0–8.3)

## 2022-11-26 LAB — LIPID PANEL
Cholesterol: 155 mg/dL (ref 0–200)
HDL: 54.1 mg/dL (ref >39.00)
LDL Cholesterol: 74 mg/dL (ref 0–99)
NonHDL: 101.29
Total CHOL/HDL Ratio: 3
Triglycerides: 137 mg/dL (ref 0.0–149.0)
VLDL: 27.4 mg/dL (ref 0.0–40.0)

## 2022-11-26 LAB — MICROALBUMIN / CREATININE URINE RATIO
Creatinine,U: 22.9 mg/dL
Microalb Creat Ratio: 3.1 mg/g (ref 0.0–30.0)
Microalb, Ur: <0.7 mg/dL (ref 0.0–1.9)

## 2022-11-26 LAB — TSH: TSH: 1.52 u[IU]/mL (ref 0.35–5.50)

## 2022-11-26 LAB — LDL CHOLESTEROL, DIRECT: Direct LDL: 92 mg/dL

## 2022-11-26 NOTE — Assessment & Plan Note (Signed)
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 She did not tolerate 5mg  reglan dose.  Eating a varied diet without diarrhea constipation or weight loss .

## 2022-11-26 NOTE — Assessment & Plan Note (Addendum)
Etiology unclear but likely related to gastroparesis Secondary to prior abdominal surgery for h/o pyloric stricture.  Awaiting Endocrinology evaluation .  She continues to wear a CGB monitor to record events.  Symptoms  have been occurring intermitttently for over  a year but not documented until Feb 2024 with a serum glucose of 41,  recurrent episodes documented with glucometer occurring mostly  post prandially, wihth  symptoms of diaphoresis and tremulousness,  recommending Low GI high protein diet reviewed in detail; emphasized need to combine protein with sugars and add starches

## 2022-11-26 NOTE — Assessment & Plan Note (Signed)

## 2022-12-02 LAB — CYTOLOGY - PAP
Comment: NEGATIVE
Diagnosis: NEGATIVE
High risk HPV: NEGATIVE

## 2022-12-06 ENCOUNTER — Encounter: Payer: Self-pay | Admitting: Internal Medicine

## 2023-01-13 ENCOUNTER — Encounter: Payer: Self-pay | Admitting: Family Medicine

## 2023-01-13 ENCOUNTER — Ambulatory Visit (INDEPENDENT_AMBULATORY_CARE_PROVIDER_SITE_OTHER)
Admission: RE | Admit: 2023-01-13 | Discharge: 2023-01-13 | Disposition: A | Discharge status: Home / Self Care | Payer: Medicare HMO | Source: Ambulatory Visit | Attending: Family Medicine | Admitting: Family Medicine

## 2023-01-13 ENCOUNTER — Ambulatory Visit (INDEPENDENT_AMBULATORY_CARE_PROVIDER_SITE_OTHER): Payer: Medicare HMO | Admitting: Family Medicine

## 2023-01-13 VITALS — BP 112/62 | HR 60 | Temp 97.3°F | Ht 59.0 in | Wt 143.8 lb

## 2023-01-13 DIAGNOSIS — M19011 Primary osteoarthritis, right shoulder: Secondary | ICD-10-CM | POA: Diagnosis not present

## 2023-01-13 DIAGNOSIS — M25511 Pain in right shoulder: Secondary | ICD-10-CM

## 2023-01-13 DIAGNOSIS — G8929 Other chronic pain: Secondary | ICD-10-CM

## 2023-01-13 MED ORDER — TRIAMCINOLONE ACETONIDE 40 MG/ML IJ SUSP
40.0000 mg | Freq: Once | INTRAMUSCULAR | Status: AC
Start: 2023-01-13 — End: 2023-01-13
  Administered 2023-01-13: 40 mg via INTRA_ARTICULAR

## 2023-01-13 NOTE — Progress Notes (Unsigned)
    Shelly Hood T. Shelly Loud, MD, CAQ Sports Medicine [facility] at [facility] [address] [place], [address]  Phone: [phone]  FAX: [phone]  Shelly Hood - 67 y.o. female  MRN [id number]  Date of Birth: 08/31/1955  Date: 01/13/2023  PCP: Sherlene Shams, MD  Referral: Sherlene Shams, MD  Chief Complaint  Patient presents with   Shoulder Pain    Right. Ongoing issue   Subjective:   Shelly Hood is a 67 y.o. very pleasant female patient with Body mass index is 29.04 kg/m. who presents with the following:  She is a very pleasant young lady who I have seen off-and-on over the years.  I saw her for her shoulder on the right side ongoing about 7 years ago.  At that point, felt like she was having some impingement and I did do a subacromial injection.  Right now in the morning she will have a lot of aching and stiffness for a few hours.  She has a deep dull ache of the shoulder.  She has not had any kind of inciting event, injury, or prior major injury, fracture, or operative intervention in the affected shoulder.  She has tried some Tylenol at home which has not really helped all that much. She is unable to take NSAIDs secondary to prior Roux-en-Y.  Predominant area of maximal pain is in the plane of abduction and when she gets above 90 degrees or so she has some significant pain.  R shoulder combo injection  Review of Systems is noted in the HPI, as appropriate  Objective:   BP 112/62   Pulse 60   Temp (!) 97.3 F (36.3 C) (Temporal)   Ht 4\' 11"  (1.499 m)   Wt 143 lb 12.8 oz (65.2 kg)   SpO2 98%   BMI 29.04 kg/m   GEN: No acute distress; alert,appropriate. PULM: Breathing comfortably in no respiratory distress PSYCH: Normally interactive.   Laboratory and Imaging Data:  Assessment and Plan:   ***

## 2023-01-14 ENCOUNTER — Telehealth: Payer: Self-pay | Admitting: Internal Medicine

## 2023-01-14 DIAGNOSIS — Z794 Long term (current) use of insulin: Secondary | ICD-10-CM

## 2023-01-14 MED ORDER — OMEPRAZOLE 40 MG PO CPDR: 40.0000 mg | DELAYED_RELEASE_CAPSULE | Freq: Every day | ORAL | 3 refills | Status: DC

## 2023-01-14 MED ORDER — FREESTYLE LIBRE 3 SENSOR MISC: 11 refills | Status: DC

## 2023-01-14 MED ORDER — FLUOXETINE HCL 40 MG PO CAPS: 40.0000 mg | ORAL_CAPSULE | Freq: Every day | ORAL | 1 refills | Status: DC

## 2023-01-14 MED ORDER — ATORVASTATIN CALCIUM 20 MG PO TABS: 20.0000 mg | ORAL_TABLET | Freq: Every day | ORAL | 3 refills | Status: DC

## 2023-01-14 NOTE — Addendum Note (Signed)
Addended by: Sandy Salaam on: 01/14/2023 05:08 PM   Modules accepted: Orders

## 2023-01-14 NOTE — Telephone Encounter (Signed)
All medications have been refilled

## 2023-01-14 NOTE — Addendum Note (Signed)
Addended by: Sandy Salaam on: 01/14/2023 01:52 PM   Modules accepted: Orders

## 2023-01-14 NOTE — Telephone Encounter (Signed)
Last refilled by Dr. Alphonsus Sias. Is it okay to refill? Pt has changed pharmacy and needs it sent to [facility].

## 2023-01-14 NOTE — Telephone Encounter (Signed)
Patient would like all here medications transferred over to [facility]. Fax #[phone].  [employer] will need a Prior Auth

## 2023-01-16 DIAGNOSIS — H5203 Hypermetropia, bilateral: Secondary | ICD-10-CM | POA: Diagnosis not present

## 2023-01-18 DIAGNOSIS — M66821 Spontaneous rupture of other tendons, right upper arm: Secondary | ICD-10-CM | POA: Diagnosis not present

## 2023-01-20 ENCOUNTER — Ambulatory Visit (INDEPENDENT_AMBULATORY_CARE_PROVIDER_SITE_OTHER): Payer: Medicare HMO | Admitting: Family Medicine

## 2023-01-20 ENCOUNTER — Encounter: Payer: Self-pay | Admitting: Family Medicine

## 2023-01-20 VITALS — BP 115/52 | HR 66 | Temp 98.4°F | Ht 59.0 in | Wt 141.0 lb

## 2023-01-20 DIAGNOSIS — R29898 Other symptoms and signs involving the musculoskeletal system: Secondary | ICD-10-CM

## 2023-01-20 DIAGNOSIS — M25511 Pain in right shoulder: Secondary | ICD-10-CM | POA: Diagnosis not present
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
## 2023-01-21 ENCOUNTER — Telehealth: Payer: Self-pay | Admitting: Internal Medicine

## 2023-01-21 NOTE — Telephone Encounter (Signed)
Per prior message

## 2023-01-21 NOTE — Telephone Encounter (Signed)
Please return call  Thanks for alerting me.  I changed the order to right.  I apologize; I know it is her right shoulder.  I accidentally clicked left when I placed the order on the computer.

## 2023-01-21 NOTE — Telephone Encounter (Signed)
Patient was scheduled for a MRI,she seen the order on mychart and it says left shoulder,however its suppose to be the right one.

## 2023-01-21 NOTE — Telephone Encounter (Signed)
Pt called stating she saw Dr. Patsy Lager on yesterday, 11/11, pt states she received the info on her MRI & saw where it mentioned her "left shoulder", pt states its her right shoulder that has the issue, not her left. Call back # [phone]

## 2023-01-21 NOTE — Telephone Encounter (Signed)
Shelly Hood notified by telephone that MRI order has been corrected.

## 2023-01-21 NOTE — Addendum Note (Signed)
Addended by: Hannah Beat on: 01/21/2023 09:18 AM   Modules accepted: Orders

## 2023-01-22 ENCOUNTER — Encounter: Payer: Self-pay | Admitting: *Deleted

## 2023-01-22 DIAGNOSIS — R5383 Other fatigue: Secondary | ICD-10-CM | POA: Diagnosis not present

## 2023-01-22 DIAGNOSIS — R7309 Other abnormal glucose: Secondary | ICD-10-CM | POA: Diagnosis not present

## 2023-01-22 DIAGNOSIS — E161 Other hypoglycemia: Secondary | ICD-10-CM | POA: Diagnosis not present

## 2023-01-22 DIAGNOSIS — Z87891 Personal history of nicotine dependence: Secondary | ICD-10-CM | POA: Diagnosis not present

## 2023-01-22 DIAGNOSIS — Z79899 Other long term (current) drug therapy: Secondary | ICD-10-CM | POA: Diagnosis not present

## 2023-01-22 DIAGNOSIS — R002 Palpitations: Secondary | ICD-10-CM | POA: Diagnosis not present

## 2023-01-22 DIAGNOSIS — E7800 Pure hypercholesterolemia, unspecified: Secondary | ICD-10-CM | POA: Diagnosis not present

## 2023-01-31 ENCOUNTER — Ambulatory Visit
Admission: RE | Admit: 2023-01-31 | Discharge: 2023-01-31 | Disposition: A | Discharge status: Home / Self Care | Payer: Medicare HMO | Source: Ambulatory Visit | Attending: Family Medicine | Admitting: Family Medicine

## 2023-01-31 DIAGNOSIS — R29898 Other symptoms and signs involving the musculoskeletal system: Secondary | ICD-10-CM | POA: Diagnosis not present

## 2023-01-31 DIAGNOSIS — M19011 Primary osteoarthritis, right shoulder: Secondary | ICD-10-CM | POA: Diagnosis not present

## 2023-01-31 DIAGNOSIS — S46811A Strain of other muscles, fascia and tendons at shoulder and upper arm level, right arm, initial encounter: Secondary | ICD-10-CM | POA: Diagnosis not present

## 2023-01-31 DIAGNOSIS — M25511 Pain in right shoulder: Secondary | ICD-10-CM

## 2023-01-31 DIAGNOSIS — M75121 Complete rotator cuff tear or rupture of right shoulder, not specified as traumatic: Secondary | ICD-10-CM | POA: Diagnosis not present

## 2023-02-04 DIAGNOSIS — E162 Hypoglycemia, unspecified: Secondary | ICD-10-CM | POA: Diagnosis not present

## 2023-02-04 DIAGNOSIS — N182 Chronic kidney disease, stage 2 (mild): Secondary | ICD-10-CM | POA: Diagnosis not present

## 2023-02-04 DIAGNOSIS — R739 Hyperglycemia, unspecified: Secondary | ICD-10-CM | POA: Diagnosis not present

## 2023-02-04 DIAGNOSIS — R7303 Prediabetes: Secondary | ICD-10-CM | POA: Diagnosis not present

## 2023-02-12 DIAGNOSIS — E162 Hypoglycemia, unspecified: Secondary | ICD-10-CM | POA: Diagnosis not present

## 2023-02-12 DIAGNOSIS — K3184 Gastroparesis: Secondary | ICD-10-CM | POA: Diagnosis not present

## 2023-02-12 DIAGNOSIS — K638219 Small intestinal bacterial overgrowth, unspecified: Secondary | ICD-10-CM | POA: Diagnosis not present

## 2023-02-13 NOTE — Telephone Encounter (Signed)
Denny Peon, can you ask Radiology to review her shoulder MRI?

## 2023-02-15 ENCOUNTER — Encounter: Payer: Self-pay | Admitting: Internal Medicine

## 2023-02-17 ENCOUNTER — Encounter: Payer: Self-pay | Admitting: Family Medicine

## 2023-02-17 DIAGNOSIS — M75121 Complete rotator cuff tear or rupture of right shoulder, not specified as traumatic: Secondary | ICD-10-CM

## 2023-02-17 DIAGNOSIS — S46219A Strain of muscle, fascia and tendon of other parts of biceps, unspecified arm, initial encounter: Secondary | ICD-10-CM

## 2023-02-17 DIAGNOSIS — M19011 Primary osteoarthritis, right shoulder: Secondary | ICD-10-CM

## 2023-02-17 MED ORDER — ALPRAZOLAM 0.25 MG PO TABS: 0.2500 mg | ORAL_TABLET | Freq: Two times a day (BID) | ORAL | 2 refills | Status: DC | PRN

## 2023-02-17 NOTE — Telephone Encounter (Signed)
Requesting: Alprazolam Contract: No UDS: No Last Visit: 11/25/2022 Next Visit: Not found Last Refill: 11/01/2022  Please Advise

## 2023-02-18 DIAGNOSIS — R4182 Altered mental status, unspecified: Secondary | ICD-10-CM | POA: Diagnosis not present

## 2023-02-18 DIAGNOSIS — M75121 Complete rotator cuff tear or rupture of right shoulder, not specified as traumatic: Secondary | ICD-10-CM | POA: Diagnosis not present

## 2023-02-18 DIAGNOSIS — E162 Hypoglycemia, unspecified: Secondary | ICD-10-CM | POA: Diagnosis not present

## 2023-02-18 DIAGNOSIS — S46111A Strain of muscle, fascia and tendon of long head of biceps, right arm, initial encounter: Secondary | ICD-10-CM | POA: Diagnosis not present

## 2023-03-19 DIAGNOSIS — R739 Hyperglycemia, unspecified: Secondary | ICD-10-CM | POA: Diagnosis not present

## 2023-03-19 DIAGNOSIS — R7303 Prediabetes: Secondary | ICD-10-CM | POA: Diagnosis not present

## 2023-03-19 DIAGNOSIS — N182 Chronic kidney disease, stage 2 (mild): Secondary | ICD-10-CM | POA: Diagnosis not present

## 2023-04-03 DIAGNOSIS — M75121 Complete rotator cuff tear or rupture of right shoulder, not specified as traumatic: Secondary | ICD-10-CM | POA: Diagnosis not present

## 2023-04-03 DIAGNOSIS — S46111A Strain of muscle, fascia and tendon of long head of biceps, right arm, initial encounter: Secondary | ICD-10-CM | POA: Diagnosis not present

## 2023-04-03 DIAGNOSIS — M75120 Complete rotator cuff tear or rupture of unspecified shoulder, not specified as traumatic: Secondary | ICD-10-CM | POA: Insufficient documentation

## 2023-04-03 DIAGNOSIS — S46119A Strain of muscle, fascia and tendon of long head of biceps, unspecified arm, initial encounter: Secondary | ICD-10-CM | POA: Insufficient documentation

## 2023-04-25 ENCOUNTER — Telehealth: Payer: Medicare HMO | Admitting: Internal Medicine

## 2023-04-25 ENCOUNTER — Encounter: Payer: Self-pay | Admitting: Internal Medicine

## 2023-04-25 ENCOUNTER — Other Ambulatory Visit: Payer: Self-pay

## 2023-04-25 VITALS — Ht 59.0 in | Wt 131.0 lb

## 2023-04-25 DIAGNOSIS — D071 Carcinoma in situ of vulva: Secondary | ICD-10-CM

## 2023-04-25 DIAGNOSIS — E785 Hyperlipidemia, unspecified: Secondary | ICD-10-CM

## 2023-04-25 DIAGNOSIS — Z720 Tobacco use: Secondary | ICD-10-CM

## 2023-04-25 DIAGNOSIS — E161 Other hypoglycemia: Secondary | ICD-10-CM

## 2023-04-25 DIAGNOSIS — K3184 Gastroparesis: Secondary | ICD-10-CM

## 2023-04-25 DIAGNOSIS — R7301 Impaired fasting glucose: Secondary | ICD-10-CM

## 2023-04-25 DIAGNOSIS — K911 Postgastric surgery syndromes: Secondary | ICD-10-CM

## 2023-04-25 DIAGNOSIS — Z8739 Personal history of other diseases of the musculoskeletal system and connective tissue: Secondary | ICD-10-CM | POA: Diagnosis not present

## 2023-04-25 DIAGNOSIS — R5383 Other fatigue: Secondary | ICD-10-CM

## 2023-04-25 NOTE — Assessment & Plan Note (Signed)
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 She did not tolerate 5mg  reglan dose. She has changed her eating habits as advised and tolerating smaller more frequent meals  .

## 2023-04-25 NOTE — Progress Notes (Signed)
 Virtual Visit via Caregility   Note   This format is felt to be most appropriate for this patient at this time.  All issues noted in this document were discussed and addressed.  No physical exam was performed (except for noted visual exam findings with Video Visits).   I connected with Shelly [facility] on 04/25/23 at  9:00 AM EST by a video enabled telemedicine application  and verified that I am speaking with the correct person using two identifiers. Location patient: home Location provider: work or home office Persons participating in the virtual visit: patient, provider  I discussed the limitations, risks, security and privacy concerns of performing an evaluation and management service by telephone and the availability of in person appointments. I also discussed with the patient that there may be a patient responsible charge related to this service. The patient expressed understanding and agreed to proceed.  Reason for visit: reactive hypoglycemia  HPI:  68 yr old female with recurrent  symptomatic hypoglycemia  and gastroparesis presents for follow up.  Since her last visit she has been under the care of [facility] and has comprehensively revised her diet to eliminate all refined sugars and reduced the numbers of starches in her diet. She recognizes that although she was advised to do this in the past,  she was not following a Hood glycemic index diet because she didn't understand  the effect of dietary sugar on her hypoglycemic episodes .  Since changing her diet she has not had any Hood readings and is wearing the PG[employer]E Hood CBG.  . I have downloaded and reviewed the data from patient's continuous blood glucose monitor. Patient's  sugars have been  IN RANGE 100    % OF THE TIME,     SIBO: diagnosed by [facility] but 2 drug (abx)  treatment deferred by patient due to history of recurrent c dif..  her symptoms of bloating ,  excessive gas and nausea have resolved.  Stools are still  occasionally loose.   Right shoulder pain: she states that she suffered a complete  rupture of the lung head of the biceps muscle which occurred on Nov 9 , several days   after receiving  2  steroid injections in the right  shoulder for impingement syndrome.  The tear was confirmed with an MRI done Apr 03 2023,  which also noted old supra and infraspinatus tears,  intat teres major and minor and short head of biceps muscle.  Reverse shoulder replacement surgery was discussed  but deferred after getting a second opinion form [facility] at [facility],  she is currently pain free and has full ROM     ROS: See pertinent positives and negatives per HPI.  Past Medical History:  Diagnosis Date   Clostridium difficile colitis 11/30/2014   Clostridium difficile infection    Depression    Gastric outlet obstruction    GERD (gastroesophageal reflux disease)    High cholesterol    Obesity    Panic attacks    Peptic ulcer disease 12/02/2014   Pyloric stenosis    Pyloric ulcer    Thrombocytopenia (HCC) 06/26/2014    Past Surgical History:  Procedure Laterality Date   ANTRECTOMY N/A 06/24/2014   Procedure: ROBOTIC DISTAL GASTRECTOMY;  Surgeon: Shelly Soda, MD;  Location: [facility];  Service: General;  Laterality: N/A;   COLONOSCOPY  AGE 21   COLONOSCOPY N/A 10/19/2014   Procedure: COLONOSCOPY;  Surgeon: Iva Boop, MD;  Location: [facility] ENDOSCOPY;  Service: Endoscopy;  Laterality: N/A;  with FMT   FECAL TRANSPLANT N/A 10/19/2014   Procedure: FECAL TRANSPLANT;  Surgeon: Iva Boop, MD;  Location: [facility];  Service: Endoscopy;  Laterality: N/A;   LAPAROSCOPIC ROUX-EN-Y GASTRIC BYPASS WITH HIATAL HERNIA REPAIR N/A 06/24/2014   Procedure: ROBOTIC ROUX-EN-Y CONSTRUCTION WITH HIATAL HERNIA REPAIR;  Surgeon: Shelly Soda, MD;  Location: [facility];  Service: General;  Laterality: N/A;   LASER ABLATION  05/26/2015   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   RHINOPLASTY     X3   VAGOTOMY N/A 06/24/2014    Procedure: ROBOTIC TRUNCAL VAGOTOMY ;  Surgeon: Shelly Soda, MD;  Location: [facility];  Service: General;  Laterality: N/A;  This is [name]'s preference    Family History  Problem Relation Age of Onset   Diabetes Mother    Heart disease Father    Heart disease Brother        pacer/defibrillator   Diabetes Brother    Lung cancer Brother    Cancer - Lung Brother    Diabetes Maternal Grandmother    Breast cancer Paternal Grandmother    Colon cancer Neg Hx     SOCIAL HX:  reports that she quit smoking about 8 years ago but has resumed the habit,  3 -4 cigs per day. .She started smoking about 38 years ago. She has a 7.5 pack-year smoking history. She has never used smokeless tobacco. She reports that she does not drink alcohol and does not use drugs.    Current Outpatient Medications:    acetaminophen (TYLENOL) 500 MG tablet, Take 1,000 mg by mouth every 6 (six) hours as needed for mild pain or fever (pain and fever). Reported on 05/17/2015, Disp: , Rfl:    ALPRAZolam (XANAX) 0.25 MG tablet, Take 1 tablet (0.25 mg total) by mouth 2 (two) times daily as needed for anxiety., Disp: 60 tablet, Rfl: 2   atorvastatin (LIPITOR) 20 MG tablet, Take 1 tablet (20 mg total) by mouth daily., Disp: 90 tablet, Rfl: 3   Continuous Glucose Sensor (FREESTYLE LIBRE 3 SENSOR) MISC, Place 1 sensor on the skin every 14 days. Use to check glucose continuously, Disp: 2 each, Rfl: 11   FLUoxetine (PROZAC) 40 MG capsule, Take 1 capsule (40 mg total) by mouth daily., Disp: 90 capsule, Rfl: 1   omeprazole (PRILOSEC) 40 MG capsule, Take 1 capsule (40 mg total) by mouth at bedtime., Disp: 90 capsule, Rfl: 3  EXAM:  VITALS per patient if applicable:  GENERAL: alert, oriented, appears well and in no acute distress  HEENT: atraumatic, conjunttiva clear, no obvious abnormalities on inspection of external nose and ears  NECK: normal movements of the head and neck  LUNGS: on inspection no signs of respiratory distress,  breathing rate appears normal, no obvious gross SOB, gasping or wheezing  CV: no obvious cyanosis  MS: moves all visible extremities without noticeable abnormality  PSYCH/NEURO: pleasant and cooperative, no obvious depression or anxiety, speech and thought processing grossly intact  ASSESSMENT AND PLAN: 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 She did not tolerate 5mg  reglan dose. She has changed her eating habits as advised and tolerating smaller more frequent meals  .     Reactive hypoglycemia Assessment & Plan: Recurrent episodes are now rare since she has overhauled her diet. . She is not using medications    VIN III (vulvar intraepithelial neoplasia  III) Assessment & Plan:  PAP in  2019  HPV positive. .  Follow up was hindered by COVID pandemic.  PAP was negative in 2024.    History of rotator cuff tear Assessment & Plan: Right shoulder , remote,  with supraspinatus and infraspinatus muscle tears as well as tear of long head of  biceps muscle, all noted on Jan 2025 MRI right shoulder .  She has recovered,  ROM is full and she is pain free,     Tobacco abuse Assessment & Plan: Risks of continued tobacco use were discussed. She is not currently interested in tobacco cessation pharmacotherapy       I discussed the assessment and treatment plan with the patient. The patient was provided an opportunity to ask questions and all were answered. The patient agreed with the plan and demonstrated an understanding of the instructions.   The patient was advised to call back or seek an in-person evaluation if the symptoms worsen or if the condition fails to improve as anticipated.   I spent 30 minutes dedicated to the care of this patient on the date of this encounter to include  review of all notes from [facility],  recent orthopedics evaluations  including MRI report,  Face-to-face time with the patient , and post visit ordering of testing and therapeutics.    Sherlene Shams, MD

## 2023-04-25 NOTE — Assessment & Plan Note (Signed)
Recurrent episodes are now rare since she has overhauled her diet. . She is not using medications

## 2023-04-25 NOTE — Assessment & Plan Note (Addendum)
Right shoulder , remote,  with supraspinatus and infraspinatus muscle tears as well as tear of long head of  biceps muscle, all noted on Jan 2025 MRI right shoulder .  She has recovered,  ROM is full and she is pain free,

## 2023-04-25 NOTE — Assessment & Plan Note (Signed)
PAP in  2019  HPV positive. .  Follow up was hindered by COVID pandemic.  PAP was negative in 2024.

## 2023-04-25 NOTE — Assessment & Plan Note (Signed)
Risks of continued tobacco use were discussed. She is not currently interested in tobacco cessation pharmacotherapy

## 2023-05-08 ENCOUNTER — Ambulatory Visit: Payer: Medicare HMO | Admitting: Podiatry

## 2023-05-08 ENCOUNTER — Encounter: Payer: Self-pay | Admitting: Podiatry

## 2023-05-08 DIAGNOSIS — M7752 Other enthesopathy of left foot: Secondary | ICD-10-CM

## 2023-05-08 NOTE — Progress Notes (Signed)
  Subjective:  Patient ID: Shelly [facility], female    DOB: 1955/08/07,  MRN: [id number]  Chief Complaint  Patient presents with   Toe Pain    Left foot 4th toe pt stated that it burns and feels like someone is trying to pull it off     68 y.o. female presents with the above complaint. History confirmed with patient.  No recent trauma or injury that she knows of  Objective:  Physical Exam: warm, good capillary refill, no trophic changes or ulcerative lesions, normal DP and PT pulses, normal sensory exam, and no instability of fourth MTP but she does have pain with range of motion and dorsal stretch, no symptoms of neuroma third or fourth interspace.  Assessment:   1. Capsulitis of metatarsophalangeal (MTP) joint of left foot      Plan:  Patient was evaluated and treated and all questions answered.  Capsulitis -Educated on etiology -Discussed padding and proper shoegear -Offloading pad applied -Injection delivered to the painful joint  Procedure: Joint Injection Location: Right fourth joint Skin Prep: Betadine. Injectate: 0.5 cc 1% Marcaine plain, 20 mg Kenalog Disposition: Patient tolerated procedure well. Injection site dressed with a band-aid.  Return if symptoms worsen or fail to improve.

## 2023-05-13 DIAGNOSIS — H5203 Hypermetropia, bilateral: Secondary | ICD-10-CM | POA: Diagnosis not present

## 2023-05-15 ENCOUNTER — Other Ambulatory Visit: Payer: Self-pay | Admitting: Internal Medicine

## 2023-05-16 ENCOUNTER — Ambulatory Visit: Payer: Self-pay | Admitting: Podiatry

## 2023-05-16 NOTE — Telephone Encounter (Signed)
Noted  Referral updated 

## 2023-05-18 ENCOUNTER — Other Ambulatory Visit: Payer: Self-pay | Admitting: *Deleted

## 2023-05-18 DIAGNOSIS — Z23 Encounter for immunization: Secondary | ICD-10-CM

## 2023-05-19 ENCOUNTER — Ambulatory Visit (INDEPENDENT_AMBULATORY_CARE_PROVIDER_SITE_OTHER): Payer: Medicare HMO

## 2023-05-19 DIAGNOSIS — R7301 Impaired fasting glucose: Secondary | ICD-10-CM

## 2023-05-19 DIAGNOSIS — R5383 Other fatigue: Secondary | ICD-10-CM | POA: Diagnosis not present

## 2023-05-19 DIAGNOSIS — E785 Hyperlipidemia, unspecified: Secondary | ICD-10-CM | POA: Diagnosis not present

## 2023-05-19 DIAGNOSIS — Z23 Encounter for immunization: Secondary | ICD-10-CM

## 2023-05-19 LAB — MICROALBUMIN / CREATININE URINE RATIO
Creatinine,U: 138.6 mg/dL
Microalb Creat Ratio: 5.1 mg/g (ref 0.0–30.0)
Microalb, Ur: <0.7 mg/dL (ref 0.0–1.9)

## 2023-05-19 NOTE — Progress Notes (Signed)
 After obtaining consent, and per orders of Dr. Darrick Huntsman, injection of Prevnar 20 given IM in R Deltoid by Malen Gauze, CMA. Patient tolerated injection well at today's visit.

## 2023-05-20 ENCOUNTER — Encounter: Payer: Self-pay | Admitting: Internal Medicine

## 2023-05-20 LAB — COMPREHENSIVE METABOLIC PANEL
ALT: 19 U/L (ref 0–35)
AST: 19 U/L (ref 0–37)
Albumin: 4 g/dL (ref 3.5–5.2)
Alkaline Phosphatase: 74 U/L (ref 39–117)
BUN: 22 mg/dL (ref 6–23)
CO2: 28 meq/L (ref 19–32)
Calcium: 9.6 mg/dL (ref 8.4–10.5)
Chloride: 101 meq/L (ref 96–112)
Creatinine, Ser: 0.91 mg/dL (ref 0.40–1.20)
GFR: 66.29 mL/min (ref >60.00)
Glucose, Bld: 81 mg/dL (ref 70–99)
Potassium: 4.4 meq/L (ref 3.5–5.1)
Sodium: 137 meq/L (ref 135–145)
Total Bilirubin: 0.2 mg/dL (ref 0.2–1.2)
Total Protein: 6.5 g/dL (ref 6.0–8.3)

## 2023-05-20 LAB — HEMOGLOBIN A1C: Hgb A1c MFr Bld: 6.2 % (ref 4.6–6.5)

## 2023-05-20 LAB — LIPID PANEL
Cholesterol: 141 mg/dL (ref 0–200)
HDL: 51.7 mg/dL (ref >39.00)
LDL Cholesterol: 73 mg/dL (ref 0–99)
NonHDL: 88.87
Total CHOL/HDL Ratio: 3
Triglycerides: 78 mg/dL (ref 0.0–149.0)
VLDL: 15.6 mg/dL (ref 0.0–40.0)

## 2023-05-20 LAB — TSH: TSH: 1.54 u[IU]/mL (ref 0.35–5.50)

## 2023-06-02 DIAGNOSIS — E162 Hypoglycemia, unspecified: Secondary | ICD-10-CM | POA: Diagnosis not present

## 2023-06-18 DIAGNOSIS — Z1211 Encounter for screening for malignant neoplasm of colon: Secondary | ICD-10-CM | POA: Diagnosis not present

## 2023-07-10 DIAGNOSIS — Z0100 Encounter for examination of eyes and vision without abnormal findings: Secondary | ICD-10-CM | POA: Diagnosis not present

## 2023-07-29 ENCOUNTER — Encounter: Payer: Self-pay | Admitting: Internal Medicine

## 2023-07-29 ENCOUNTER — Other Ambulatory Visit: Payer: Self-pay | Admitting: Internal Medicine

## 2023-07-29 NOTE — Telephone Encounter (Signed)
 Requesting: Xanax  Contract: No UDS: NO Last Visit: 11/25/2022 Next Visit: Visit date not found Last Refill:  Last refill: 06/04/2023    Rx #: [id number]    Please Advise

## 2023-07-30 ENCOUNTER — Other Ambulatory Visit: Payer: Self-pay | Admitting: Internal Medicine

## 2023-07-30 ENCOUNTER — Telehealth: Payer: Self-pay | Admitting: Internal Medicine

## 2023-07-30 NOTE — Telephone Encounter (Signed)
 Refilled: 02/17/2023 Last OV: 04/25/2023 Next OV: not scheduled

## 2023-07-30 NOTE — Telephone Encounter (Signed)
 Duplicate request

## 2023-07-30 NOTE — Telephone Encounter (Signed)
 Copied from CRM [id number]. Topic: Clinical - Medication Question >> Jul 30, 2023  3:19 PM Alyse July wrote: Reason for CRM: patient would like a call back for clarification on why it showing she has 2 refills on her ALPRAZolam  (XANAX ) 0.25 MG tablet medication but she is being told by the pharmacy that she doesn't have any refill.   Please contact patient to advise. [phone]

## 2023-07-30 NOTE — Telephone Encounter (Signed)
 Left message for patient to call and schedule initial welcome to medicare appointment with provider. Appointment has to be 60  minutes long and scheduled before 01/10/2024.  E2C2 please schedule

## 2023-07-31 ENCOUNTER — Other Ambulatory Visit: Payer: Self-pay | Admitting: Internal Medicine

## 2023-07-31 DIAGNOSIS — Z1231 Encounter for screening mammogram for malignant neoplasm of breast: Secondary | ICD-10-CM

## 2023-07-31 NOTE — Telephone Encounter (Signed)
 Patient called in to schedule an OV for med follow-up. However, pcp is not available until July. She asked if she can be seen sooner to have her medications. Please call and advise.

## 2023-07-31 NOTE — Telephone Encounter (Signed)
 Spoke with pt and scheduled her an appt for June 12th.

## 2023-08-15 ENCOUNTER — Ambulatory Visit
Admission: RE | Admit: 2023-08-15 | Discharge: 2023-08-15 | Disposition: A | Discharge status: Home / Self Care | Source: Ambulatory Visit | Attending: Internal Medicine | Admitting: Internal Medicine

## 2023-08-15 DIAGNOSIS — Z1231 Encounter for screening mammogram for malignant neoplasm of breast: Secondary | ICD-10-CM | POA: Insufficient documentation

## 2023-08-21 ENCOUNTER — Encounter: Payer: Self-pay | Admitting: Internal Medicine

## 2023-08-21 ENCOUNTER — Ambulatory Visit (INDEPENDENT_AMBULATORY_CARE_PROVIDER_SITE_OTHER): Admitting: Internal Medicine

## 2023-08-21 VITALS — BP 112/62 | HR 62 | Temp 97.6°F | Resp 17 | Ht 59.0 in | Wt 119.1 lb

## 2023-08-21 DIAGNOSIS — R7301 Impaired fasting glucose: Secondary | ICD-10-CM

## 2023-08-21 DIAGNOSIS — Z91410 Personal history of adult physical and sexual abuse: Secondary | ICD-10-CM | POA: Diagnosis not present

## 2023-08-21 DIAGNOSIS — E785 Hyperlipidemia, unspecified: Secondary | ICD-10-CM

## 2023-08-21 DIAGNOSIS — E161 Other hypoglycemia: Secondary | ICD-10-CM

## 2023-08-21 DIAGNOSIS — F419 Anxiety disorder, unspecified: Secondary | ICD-10-CM | POA: Diagnosis not present

## 2023-08-21 NOTE — Patient Instructions (Addendum)
 Congratulations on the resolving the hypoglycemia and seeing the light about hidden sugars!  Next thing to tackle  is your use of alprazolam    I want you to start weaning yourself off of alprazolam .  We can start with finding an alternative to the nighttime  dose that you take to help you sleep  I recommend that you try using Relaxium for insomnia  (as seen on TV commercials) . It is available through [employer] and contains all natural supplements:  Melatonin 5 mg  Chamomile 25 mg Passionflower extract 75 mg GABA 100 mg Ashwaganda extract 125 mg Magnesium  citrate, glycinate, oxide (100 mg)  L tryptophan 500 mg Valerest (proprietary  ingredient ; probably valeria root extract)     You can have your labs done prior to your next visit
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
## 2023-08-22 DIAGNOSIS — Z91410 Personal history of adult physical and sexual abuse: Secondary | ICD-10-CM | POA: Insufficient documentation
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
## 2023-08-22 NOTE — Assessment & Plan Note (Signed)
 She has had no episodes since changing her diet to a low GI diet and has lost 20 lbs thorught carbohydrate restriction
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
## 2023-09-16 ENCOUNTER — Encounter: Payer: Self-pay | Admitting: Internal Medicine

## 2023-09-16 ENCOUNTER — Other Ambulatory Visit: Payer: Self-pay | Admitting: Internal Medicine

## 2023-09-16 NOTE — Telephone Encounter (Signed)
 Requesting: xANAX  Contract: No UDS: NO Last Visit: 08/21/2023 Next Visit: 11/26/2023 Last Refill:  E-Prescribing Status: Receipt confirmed by pharmacy (07/31/2023  6:03 AM EDT)

## 2023-09-17 ENCOUNTER — Encounter: Payer: Self-pay | Admitting: Internal Medicine

## 2023-10-05 DIAGNOSIS — S52502A Unspecified fracture of the lower end of left radius, initial encounter for closed fracture: Secondary | ICD-10-CM | POA: Diagnosis not present

## 2023-10-10 DIAGNOSIS — S52502A Unspecified fracture of the lower end of left radius, initial encounter for closed fracture: Secondary | ICD-10-CM | POA: Diagnosis not present

## 2023-10-12 DIAGNOSIS — Z4789 Encounter for other orthopedic aftercare: Secondary | ICD-10-CM | POA: Diagnosis not present

## 2023-10-22 ENCOUNTER — Encounter: Payer: Self-pay | Admitting: Internal Medicine

## 2023-10-29 DIAGNOSIS — R202 Paresthesia of skin: Secondary | ICD-10-CM | POA: Diagnosis not present

## 2023-10-29 DIAGNOSIS — S52502A Unspecified fracture of the lower end of left radius, initial encounter for closed fracture: Secondary | ICD-10-CM | POA: Diagnosis not present

## 2023-10-29 DIAGNOSIS — S52532K Colles' fracture of left radius, subsequent encounter for closed fracture with nonunion: Secondary | ICD-10-CM | POA: Diagnosis not present

## 2023-11-17 DIAGNOSIS — K5720 Diverticulitis of large intestine with perforation and abscess without bleeding: Secondary | ICD-10-CM | POA: Diagnosis not present

## 2023-11-17 DIAGNOSIS — Z881 Allergy status to other antibiotic agents status: Secondary | ICD-10-CM | POA: Diagnosis not present

## 2023-11-17 DIAGNOSIS — K219 Gastro-esophageal reflux disease without esophagitis: Secondary | ICD-10-CM | POA: Diagnosis not present

## 2023-11-17 DIAGNOSIS — K5732 Diverticulitis of large intestine without perforation or abscess without bleeding: Secondary | ICD-10-CM | POA: Diagnosis not present

## 2023-11-17 DIAGNOSIS — K9589 Other complications of other bariatric procedure: Secondary | ICD-10-CM | POA: Diagnosis not present

## 2023-11-17 DIAGNOSIS — R1032 Left lower quadrant pain: Secondary | ICD-10-CM | POA: Diagnosis not present

## 2023-11-17 DIAGNOSIS — Z886 Allergy status to analgesic agent status: Secondary | ICD-10-CM | POA: Diagnosis not present

## 2023-11-17 DIAGNOSIS — F410 Panic disorder [episodic paroxysmal anxiety] without agoraphobia: Secondary | ICD-10-CM | POA: Diagnosis not present

## 2023-11-17 DIAGNOSIS — K5730 Diverticulosis of large intestine without perforation or abscess without bleeding: Secondary | ICD-10-CM | POA: Diagnosis not present

## 2023-11-17 DIAGNOSIS — Z79899 Other long term (current) drug therapy: Secondary | ICD-10-CM | POA: Diagnosis not present

## 2023-11-17 DIAGNOSIS — R10824 Left lower quadrant rebound abdominal tenderness: Secondary | ICD-10-CM | POA: Diagnosis not present

## 2023-11-17 DIAGNOSIS — Z885 Allergy status to narcotic agent status: Secondary | ICD-10-CM | POA: Diagnosis not present

## 2023-11-17 DIAGNOSIS — Z8711 Personal history of peptic ulcer disease: Secondary | ICD-10-CM | POA: Diagnosis not present

## 2023-11-17 DIAGNOSIS — E7800 Pure hypercholesterolemia, unspecified: Secondary | ICD-10-CM | POA: Diagnosis not present

## 2023-11-18 DIAGNOSIS — K5792 Diverticulitis of intestine, part unspecified, without perforation or abscess without bleeding: Secondary | ICD-10-CM | POA: Diagnosis not present

## 2023-11-21 DIAGNOSIS — K5792 Diverticulitis of intestine, part unspecified, without perforation or abscess without bleeding: Secondary | ICD-10-CM | POA: Diagnosis not present

## 2023-11-24 ENCOUNTER — Other Ambulatory Visit: Payer: Self-pay | Admitting: Internal Medicine

## 2023-11-26 ENCOUNTER — Encounter: Admitting: Internal Medicine

## 2023-11-27 ENCOUNTER — Ambulatory Visit (INDEPENDENT_AMBULATORY_CARE_PROVIDER_SITE_OTHER): Admitting: Internal Medicine

## 2023-11-27 ENCOUNTER — Encounter: Payer: Self-pay | Admitting: Internal Medicine

## 2023-11-27 VITALS — BP 118/58 | Ht 59.0 in | Wt 118.0 lb

## 2023-11-27 DIAGNOSIS — F32A Depression, unspecified: Secondary | ICD-10-CM

## 2023-11-27 DIAGNOSIS — E1169 Type 2 diabetes mellitus with other specified complication: Secondary | ICD-10-CM

## 2023-11-27 DIAGNOSIS — Z23 Encounter for immunization: Secondary | ICD-10-CM

## 2023-11-27 DIAGNOSIS — I700 Atherosclerosis of aorta: Secondary | ICD-10-CM

## 2023-11-27 DIAGNOSIS — F419 Anxiety disorder, unspecified: Secondary | ICD-10-CM | POA: Diagnosis not present

## 2023-11-27 DIAGNOSIS — K311 Adult hypertrophic pyloric stenosis: Secondary | ICD-10-CM | POA: Diagnosis not present

## 2023-11-27 DIAGNOSIS — K3184 Gastroparesis: Secondary | ICD-10-CM

## 2023-11-27 DIAGNOSIS — K638219 Small intestinal bacterial overgrowth, unspecified: Secondary | ICD-10-CM

## 2023-11-27 DIAGNOSIS — S52592D Other fractures of lower end of left radius, subsequent encounter for closed fracture with routine healing: Secondary | ICD-10-CM | POA: Diagnosis not present

## 2023-11-27 DIAGNOSIS — K911 Postgastric surgery syndromes: Secondary | ICD-10-CM

## 2023-11-27 DIAGNOSIS — E785 Hyperlipidemia, unspecified: Secondary | ICD-10-CM

## 2023-11-27 DIAGNOSIS — E11649 Type 2 diabetes mellitus with hypoglycemia without coma: Secondary | ICD-10-CM | POA: Diagnosis not present

## 2023-11-27 DIAGNOSIS — S52502A Unspecified fracture of the lower end of left radius, initial encounter for closed fracture: Secondary | ICD-10-CM | POA: Diagnosis not present

## 2023-11-27 NOTE — Progress Notes (Unsigned)
 Subjective:    Patient ID: Shelly Hood, female    DOB: 08/30/1955, 68 y.o.   MRN: [id number]  HPI  Patient presents to clinic today to establish care and for management of the conditions listed below.  Anxiety and depression: Chronic, managed on fluoxetine  and alprazolam . She takes alprazolam  2 x daily. She is not currently seeing a therapist.  She denies SI/HI.  GERD with PUD, gastroparesis: She is not sure what has triggered this. She denies breakthrough on omeprazole .  Upper GI from 03/2014 reviewed.  DM 2: Her last A1c was 6.2%, 05/2023.  She is not taking any oral diabetic medication this time.  She does have reactive hypoglycemia but she manages this with diet. She has not had any hypoglycemia since January 2025.  She does not check her feet routinely.  Her last eye exam was 05/2023.  Flu 11/2022.  Prevnar 05/2023.  COVID never. She follows with endocrinology.  HLD with aortic atherosclerosis: Her last LDL was 73, triglycerides 78, 05/2023.  She denies myalgias on atorvastatin . She is not taking aspirin.  She tries to consume low-fat diet.  SIBO: She reports mostly bloating and gassiness. She did not take xifaxin due to her previous history of cdiff. She follows with GI.  Review of Systems   Past Medical History:  Diagnosis Date   Clostridium difficile colitis 11/30/2014   Clostridium difficile infection    Depression    Gastric outlet obstruction    GERD (gastroesophageal reflux disease)    High cholesterol    Obesity    Panic attacks    Peptic ulcer disease 12/02/2014   Pyloric stenosis    Pyloric ulcer    Thrombocytopenia (HCC) 06/26/2014    Current Outpatient Medications  Medication Sig Dispense Refill   acetaminophen  (TYLENOL ) 500 MG tablet Take 1,000 mg by mouth every 6 (six) hours as needed for mild pain or fever (pain and fever). Reported on 05/17/2015     ALPRAZolam  (XANAX ) 0.25 MG tablet TAKE 1 TABLET BY MOUTH TWICE A DAY AS NEEDED FOR ANXIETY 45 tablet 2    atorvastatin  (LIPITOR) 20 MG tablet TAKE 1 TABLET DAILY 90 tablet 1   Continuous Glucose Sensor (FREESTYLE LIBRE 3 SENSOR) MISC Place 1 sensor on the skin every 14 days. Use to check glucose continuously 2 each 11   FLUoxetine  (PROZAC ) 40 MG capsule TAKE 1 CAPSULE DAILY 90 capsule 1   omeprazole  (PRILOSEC) 40 MG capsule TAKE 1 CAPSULE AT BEDTIME 90 capsule 1   prednisoLONE acetate (PRED FORTE) 1 % ophthalmic suspension SMARTSIG:In Eye(s)     No current facility-administered medications for this visit.    Allergies  Allergen Reactions   Cefotetan  Other (See Comments)    Thrombocytopenia, plat ct 18K   Naproxen     ulcers   Nsaids     DUODENAL ULCER   Aspirin     Ulcers    Ibuprofen     ulcers    Toradol  [Ketorolac  Tromethamine ]     ulcers   Codeine Sulfate Nausea Only    REACTION: nausea only tolerates hydrocodone   Flagyl  [Metronidazole ] Other (See Comments)    Weak, bad taste     Family History  Problem Relation Age of Onset   Diabetes Mother    Heart disease Father    Heart disease Brother        pacer/defibrillator   Diabetes Brother    Lung cancer Brother    Cancer - Lung Brother    Diabetes Maternal  Grandmother    Breast cancer Paternal Grandmother    Colon cancer Neg Hx     Social History   Socioeconomic History   Marital status: Married    Spouse name: Not on file   Number of children: 2   Years of education: Not on file   Highest education level: Associate degree: occupational, Scientist, product/process development, or vocational program  Occupational History   Occupation: Real estate management--retired    Comment: [employer] in [place]   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[employer]  Tobacco Use   Smoking status: Former    Current packs/day: 0.00    Average packs/day: 0.3 packs/day for 30.0 years (7.5 ttl pk-yrs)    Types: Cigarettes    Start date: 06/17/1984    Quit date: 06/18/2014    Years since quitting: 9.4   Smokeless tobacco: Never  Substance and Sexual Activity    Alcohol use: No    Alcohol/week: 0.0 standard drinks of alcohol   Drug use: No   Sexual activity: Not Currently    Partners: Male    Birth control/protection: Post-menopausal  Other Topics Concern   Not on file  Social History Narrative   Not on file   Social Drivers of Health   Financial Resource Strain: Low Risk  (06/10/2022)   Overall Financial Resource Strain (CARDIA)    Difficulty of Paying Living Expenses: Not hard at all  Food Insecurity: No Food Insecurity (06/10/2022)   Hunger Vital Sign    Worried About Running Out of Food in the Last Year: Never true    Ran Out of Food in the Last Year: Never true  Transportation Needs: No Transportation Needs (06/10/2022)   PRAPARE - Administrator, Civil Service (Medical): No    Lack of Transportation (Non-Medical): No  Physical Activity: Unknown (06/10/2022)   Exercise Vital Sign    Days of Exercise per Week: Patient declined    Minutes of Exercise per Session: Not on file  Stress: No Stress Concern Present (06/10/2022)   [employer] of Occupational Health - Occupational Stress Questionnaire    Feeling of Stress : Only a little  Social Connections: Unknown (06/10/2022)   Social Connection and Isolation Panel    Frequency of Communication with Friends and Family: More than three times a week    Frequency of Social Gatherings with Friends and Family: Once a week    Attends Religious Services: Patient declined    Database administrator or Organizations: Yes    Attends Banker Meetings: Patient declined    Marital Status: Married  Catering manager Violence: Not on file     Constitutional: Denies fever, malaise, fatigue, headache or abrupt weight changes.  HEENT: Denies eye pain, eye redness, ear pain, ringing in the ears, wax buildup, runny nose, nasal congestion, bloody nose, or sore throat. Respiratory: Denies difficulty breathing, shortness of breath, cough or sputum production.   Cardiovascular: Denies  chest pain, chest tightness, palpitations or swelling in the hands or feet.  Gastrointestinal: Pt reports bloating. Denies abdominal pain, constipation, diarrhea or blood in the stool.  GU: Denies urgency, frequency, pain with urination, burning sensation, blood in urine, odor or discharge. Musculoskeletal: Denies decrease in range of motion, difficulty with gait, muscle pain or joint pain and swelling.  Skin: Denies redness, rashes, lesions or ulcercations.  Neurological: Denies dizziness, difficulty with memory, difficulty with speech or problems with balance and coordination.  Psych: Patient has a history of anxiety and depression.  Denies SI/HI.  No other  specific complaints in a complete review of systems (except as listed in HPI above).      Objective:   Physical Exam BP (!) 118/58 (BP Location: Right Arm, Patient Position: Sitting, Cuff Size: Normal)   Ht 4' 11 (1.499 m)   Wt 118 lb (53.5 kg)   BMI 23.83 kg/m   Wt Readings from Last 3 Encounters:  08/21/23 119 lb 2 oz (54 kg)  04/25/23 131 lb (59.4 kg)  01/20/23 141 lb (64 kg)    General: Appears their stated age, well developed, well nourished in NAD. Skin: Warm, dry and intact. No rashes, lesions or ulcerations noted. HEENT: Head: normal shape and size; Eyes: sclera white, no icterus, conjunctiva pink, PERRLA and EOMs intact; Ears: Tm's gray and intact, normal light reflex; Nose: mucosa pink and moist, septum midline; Throat/Mouth: Teeth present, mucosa pink and moist, no exudate, lesions or ulcerations noted.  Neck:  Neck supple, trachea midline. No masses, lumps or thyromegaly present.  Cardiovascular: Normal rate and rhythm. S1,S2 noted.  No murmur, rubs or gallops noted. No JVD or BLE edema. No carotid bruits noted. Pulmonary/Chest: Normal effort and positive vesicular breath sounds. No respiratory distress. No wheezes, rales or ronchi noted.  Abdomen: Soft and nontender. Normal bowel sounds. No distention or masses  noted. Liver, spleen and kidneys non palpable. Musculoskeletal: Normal range of motion. No signs of joint swelling. No difficulty with gait.  Neurological: Alert and oriented. Cranial nerves II-XII grossly intact. Coordination normal.  Psychiatric: Mood and affect normal. Behavior is normal. Judgment and thought content normal.    BMET    Component Value Date/Time   NA 137 05/19/2023 1353   NA 141 06/13/2016 0910   K 4.4 05/19/2023 1353   CL 101 05/19/2023 1353   CO2 28 05/19/2023 1353   GLUCOSE 81 05/19/2023 1353   BUN 22 05/19/2023 1353   BUN 9 06/13/2016 0910   CREATININE 0.91 05/19/2023 1353   CREATININE 0.85 09/10/2021 1456   CALCIUM  9.6 05/19/2023 1353   GFRNONAA 87 06/13/2016 0910   GFRNONAA >89 10/06/2014 1113   GFRAA 100 06/13/2016 0910   GFRAA >89 10/06/2014 1113    Lipid Panel     Component Value Date/Time   CHOL 141 05/19/2023 1353   CHOL 189 06/13/2016 0910   TRIG 78.0 05/19/2023 1353   HDL 51.70 05/19/2023 1353   HDL 47 06/13/2016 0910   CHOLHDL 3 05/19/2023 1353   VLDL 15.6 05/19/2023 1353   LDLCALC 73 05/19/2023 1353   LDLCALC 78 09/10/2021 1456    CBC    Component Value Date/Time   WBC 9.2 04/15/2022 1346   RBC 4.21 04/15/2022 1346   HGB 12.1 04/15/2022 1346   HGB 13.5 06/13/2016 0910   HCT 36.7 04/15/2022 1346   HCT 41.1 06/13/2016 0910   PLT 286.0 04/15/2022 1346   PLT 310 06/13/2016 0910   MCV 87.2 04/15/2022 1346   MCV 92 06/13/2016 0910   MCH 28.7 09/10/2021 1456   MCHC 33.0 04/15/2022 1346   RDW 15.0 04/15/2022 1346   RDW 13.8 06/13/2016 0910   LYMPHSABS 1.9 04/15/2022 1346   MONOABS 0.9 04/15/2022 1346   EOSABS 0.1 04/15/2022 1346   BASOSABS 0.1 04/15/2022 1346    Hgb A1C Lab Results  Component Value Date   HGBA1C 6.2 05/19/2023            Assessment & Plan:    RTC in 6 months for your annual exam Angeline Laura, NP

## 2023-11-28 ENCOUNTER — Encounter: Payer: Self-pay | Admitting: Internal Medicine

## 2023-11-28 DIAGNOSIS — K638219 Small intestinal bacterial overgrowth, unspecified: Secondary | ICD-10-CM | POA: Insufficient documentation

## 2023-11-28 DIAGNOSIS — I700 Atherosclerosis of aorta: Secondary | ICD-10-CM | POA: Insufficient documentation

## 2023-11-28 NOTE — Assessment & Plan Note (Signed)
 Lipid profile reviewed Encouraged her to consume a low-fat diet Continue atorvastatin  20 mg daily Encouraged her to start aspirin 81 mg daily

## 2023-11-28 NOTE — Assessment & Plan Note (Signed)
 Continue fluoxetine  40 mg daily She will continue alprazolam  0.25 mg twice daily as needed, declines wean at this time Support offered

## 2023-11-28 NOTE — Assessment & Plan Note (Addendum)
 Continue omeprazole  40 mg daily She will continue to follow with GI

## 2023-11-28 NOTE — Assessment & Plan Note (Signed)
 Asymptomatic at this time Continue omeprazole  40 mg daily She will continue to follow with GI

## 2023-11-28 NOTE — Assessment & Plan Note (Signed)
 With reactive hypoglycemia She is not currently taking medications at this time Encouraged low-carb diet Encouraged routine eye exam Immunizations UTD

## 2023-11-28 NOTE — Patient Instructions (Signed)
 Atherosclerosis  Atherosclerosis is when plaque builds up in the arteries. This causes narrowing and hardening of the arteries. Arteries are blood vessels that carry blood from the heart to all parts of the body. This blood contains oxygen. Plaque occurs due to inflammation or from a buildup of fat, cholesterol, calcium, waste products of cells, and a clotting material in the blood (fibrin). Plaque decreases the amount of blood that can flow through the artery. Atherosclerosis can affect any artery in your body, including: Heart arteries. Damage to these arteries may lead to coronary artery disease, which can cause a heart attack. Brain arteries. Damage to these arteries may cause a stroke. Leg, arm, and pelvis arteries. Peripheral artery disease (PAD) may result from damage to these arteries. Kidney arteries. Kidney (renal) failure may result from damage to kidney arteries. Treatment may slow the disease and prevent further damage to your heart, brain, peripheral arteries, and kidneys. What are the causes? This condition develops slowly over many years. The inner layers of your arteries become damaged and allow the gradual buildup of plaque. The exact cause of atherosclerosis is not fully understood. Symptoms of atherosclerosis do not occur until an artery becomes narrow or blocked. What increases the risk? The following factors may make you more likely to develop this condition: Being middle-aged or older. Certain medical conditions, including: High blood pressure. High cholesterol. High blood fats (triglycerides). Diabetes. Sleep apnea. Obesity. Certain lab levels, including: Elevated C-reactive protein (CRP). This is a sign of increased inflammation in your body. Elevated homocysteine levels. This is an amino acid that is associated with heart and blood vessel disease. Using tobacco or nicotine products. A family history of atherosclerosis. Not exercising enough (sedentary  lifestyle). Being stressed. Drinking too much alcohol or using drugs, such as cocaine or methamphetamine. What are the signs or symptoms? Symptoms of atherosclerosis do not occur until the plaque severely narrows or blocks the artery, which decreases blood flow. Sometimes, atherosclerosis does not cause symptoms. Symptoms of this condition include: Coronary artery disease. This may cause chest pain and shortness of breath. Decreased blood supply to your brain, which may cause a stroke. Signs of a stroke may include sudden: Weakness or numbness in your face, arm, or leg, especially on one side of your body. Trouble walking or difficulty moving your arms or legs. Loss of balance or coordination. Confusion. Slurred speech. Trouble speaking, or trouble understanding speech, or both (aphasia). Vision changes in one or both eyes. This may be double vision, blurred vision, or loss of vision. Severe headache with no known cause. The headache is often described as the worst headache ever experienced. PAD, which may cause pain, numbness, or nonhealing wounds, often in your legs and hips. Renal failure. This may cause tiredness, problems with urination, swelling, and itchy skin. How is this diagnosed? This condition is diagnosed based on your medical history and a physical exam. During the exam, your health care provider will: Check your pulse in different places. Listen for a "whooshing" sound over your arteries (bruit). You may also have tests, such as: Blood tests to check your levels of cholesterol, triglycerides, blood sugar, and CRP. Ankle-brachial index to compare blood pressure in your arms to blood pressure in your ankles to see how your blood is flowing. Heart (cardiac) tests. Electrocardiogram (ECG) to check for heart damage. Stress test to see how your heart reacts to exercise. Ultrasound tests. Ultrasound of your peripheral arteries to check blood flow. Echocardiogram to get images of  your heart's  chambers and valves. X-ray tests. Chest X-ray to see if you have an enlarged heart, which is a sign of heart failure. CT scan to check for damage to your heart, brain, or arteries. Angiogram. This is a test where dye is injected and X-rays are used to see the blood flow in the arteries. How is this treated? This condition is treated with lifestyle changes as the first step. These may include: Changing your diet. Losing weight. Reducing stress. Exercising and being physically active more regularly. Quitting smoking. You may also need medicine to: Lower triglycerides and cholesterol. Control blood pressure. Prevent blood clots. Lower inflammation in your body. Control your blood sugar. Sometimes, surgery is needed to: Remove plaque from an artery (endarterectomy). Open or widen a narrowed heart artery or peripheral artery (angioplasty). Create a new path for your blood with one of these procedures: Heart (coronary) artery bypass graft surgery. Peripheral artery bypass graft surgery. Place a small mesh tube (stent) in an artery to open or widen a narrowed artery. Follow these instructions at home: Eating and drinking  Eat a heart-healthy diet. Talk with your health care provider or a dietitian if you need help. A heart-healthy diet involves: Limiting unhealthy fats and increasing healthy fats. Some examples of healthy fats are avocados and olive oil. Eating plant-based foods, such as fruits, vegetables, nuts, whole grains, and legumes (such as peas and lentils). If you drink alcohol: Limit how much you have to: 0-1 drink a day for women who are not pregnant. 0-2 drinks a day for men. Know how much alcohol is in a drink. In the [place] one drink equals one 12 oz bottle of beer (355 mL), one 5 oz glass of wine (148 mL), or one 1 oz glass of hard liquor (44 mL). Lifestyle  Maintain a healthy weight. Lose weight if your health care provider says that you need to do  that. Follow an exercise program as told by your health care provider. Do not use any products that contain nicotine or tobacco. These products include cigarettes, chewing tobacco, and vaping devices, such as e-cigarettes. If you need help quitting, ask your health care provider. Do not use drugs. General instructions Take over-the-counter and prescription medicines only as told by your health care provider. Manage other health conditions as told. Keep all follow-up visits. This is important. Contact a health care provider if you have: An irregular heartbeat. Unexplained tiredness (fatigue). Trouble urinating, or you are producing less urine or foamy urine. Swelling of your hands or feet, or itchy skin. Unexplained pain or numbness in your legs or hips. A wound that is slow to heal or is not healing. Get help right away if: You have any symptoms of a heart attack. These may be: Chest pain. This includes squeezing chest pain that may feel like indigestion (angina). Shortness of breath. Pain in your neck, jaw, arms, back, or stomach. Cold sweat. Nausea. Light-headedness. Sudden pain, numbness, or coldness in a limb. You have any symptoms of a stroke. "BE FAST" is an easy way to remember the main warning signs of a stroke: B - Balance. Signs are dizziness, sudden trouble walking, or loss of balance. E - Eyes. Signs are trouble seeing or a sudden change in vision. F - Face. Signs are sudden weakness or numbness of the face, or the face or eyelid drooping on one side. A - Arms. Signs are weakness or numbness in an arm. This happens suddenly and usually on one side of the body. S -  Speech. Signs are sudden trouble speaking, slurred speech, or trouble understanding what people say. T - Time. Time to call emergency services. Write down what time symptoms started. You have other signs of a stroke, such as: A sudden, severe headache with no known cause. Nausea or vomiting. Seizure. These  symptoms may represent a serious problem that is an emergency. Do not wait to see if the symptoms will go away. Get medical help right away. Call your local emergency services (911 in the [place] Do not drive yourself to the hospital. Summary Atherosclerosis is when plaque builds up in the arteries and causes narrowing and hardening of the arteries. Plaque occurs due to inflammation or from a buildup of fat, cholesterol, calcium, cellular waste products, and fibrin. This condition may not cause any symptoms. Symptoms of atherosclerosis do not occur until the plaque severely narrows or blocks the artery. Treatment starts with lifestyle changes and may include medicines. In some cases, surgery is needed. Get help right away if you have any symptoms of a heart attack or stroke. This information is not intended to replace advice given to you by your health care provider. Make sure you discuss any questions you have with your health care provider. Document Revised: 05/31/2020 Document Reviewed: 05/31/2020 Elsevier Patient Education  2024 [employer].

## 2023-11-28 NOTE — Assessment & Plan Note (Signed)
 Lipid profile reviewed Encouraged her to consume a low-fat diet Continue atorvastatin  20 mg daily

## 2023-11-28 NOTE — Assessment & Plan Note (Signed)
 She has not been treated with the xifaxan due to risk for C. Difficile She will continue to follow with GI

## 2023-12-12 DIAGNOSIS — K5792 Diverticulitis of intestine, part unspecified, without perforation or abscess without bleeding: Secondary | ICD-10-CM | POA: Diagnosis not present

## 2023-12-12 DIAGNOSIS — K5720 Diverticulitis of large intestine with perforation and abscess without bleeding: Secondary | ICD-10-CM | POA: Diagnosis not present

## 2023-12-12 DIAGNOSIS — Z09 Encounter for follow-up examination after completed treatment for conditions other than malignant neoplasm: Secondary | ICD-10-CM | POA: Diagnosis not present

## 2023-12-31 ENCOUNTER — Encounter: Payer: Self-pay | Admitting: Internal Medicine

## 2023-12-31 DIAGNOSIS — Z1211 Encounter for screening for malignant neoplasm of colon: Secondary | ICD-10-CM | POA: Diagnosis not present

## 2023-12-31 DIAGNOSIS — K5792 Diverticulitis of intestine, part unspecified, without perforation or abscess without bleeding: Secondary | ICD-10-CM | POA: Diagnosis not present

## 2023-12-31 DIAGNOSIS — K3184 Gastroparesis: Secondary | ICD-10-CM | POA: Diagnosis not present

## 2024-01-01 MED ORDER — ALPRAZOLAM 0.25 MG PO TABS: 0.2500 mg | ORAL_TABLET | Freq: Two times a day (BID) | ORAL | 0 refills | Status: DC | PRN

## 2024-01-01 NOTE — Addendum Note (Signed)
 Addended by: ZELIA GAUZE D on: 01/01/2024 02:38 PM   Modules accepted: Orders

## 2024-01-05 ENCOUNTER — Encounter: Payer: Self-pay | Admitting: Internal Medicine

## 2024-01-06 ENCOUNTER — Telehealth: Payer: Self-pay

## 2024-01-06 NOTE — Telephone Encounter (Signed)
 Copied from CRM [id number]. Topic: Clinical - Prescription Issue >> Jan 05, 2024  2:48 PM Kevelyn M wrote: Reason for CRM: [facility] calling for Xantax 0.25-needs Dr. Katherleen DEA# for this prescription. The are requesting to not send in a new prescription, please just give them call.  Call back [phone] option 2 Ref#[id number]

## 2024-01-06 NOTE — Telephone Encounter (Signed)
 Dea number provided.

## 2024-01-08 ENCOUNTER — Other Ambulatory Visit: Payer: Self-pay | Admitting: Internal Medicine

## 2024-01-08 ENCOUNTER — Encounter: Payer: Self-pay | Admitting: Internal Medicine

## 2024-01-12 ENCOUNTER — Ambulatory Visit
Admission: RE | Admit: 2024-01-12 | Discharge: 2024-01-12 | Disposition: A | Discharge status: Home / Self Care | Payer: Self-pay | Source: Ambulatory Visit | Attending: Emergency Medicine | Admitting: Emergency Medicine

## 2024-01-12 VITALS — BP 124/71 | HR 66 | Temp 97.9°F | Resp 18

## 2024-01-12 DIAGNOSIS — N3001 Acute cystitis with hematuria: Secondary | ICD-10-CM | POA: Diagnosis not present

## 2024-01-12 DIAGNOSIS — R350 Frequency of micturition: Secondary | ICD-10-CM | POA: Diagnosis not present

## 2024-01-12 LAB — POCT URINE DIPSTICK
Glucose, UA: 250 mg/dL — AB
Nitrite, UA: POSITIVE — AB
POC PROTEIN,UA: >=300 — AB
Spec Grav, UA: 1.02 (ref 1.010–1.025)
Urobilinogen, UA: >=8 U/dL — AB
pH, UA: 5 (ref 5.0–8.0)

## 2024-01-12 MED ORDER — NITROFURANTOIN MONOHYD MACRO 100 MG PO CAPS: 100.0000 mg | ORAL_CAPSULE | Freq: Two times a day (BID) | ORAL | 0 refills | Status: AC

## 2024-01-12 NOTE — ED Provider Notes (Signed)
 [facility]    CSN: [id number] Arrival date & time: 01/12/24  1147      History   Chief Complaint Chief Complaint  Patient presents with   Urinary Frequency    Uti - Entered by patient    HPI Shelly Hood is a 69 y.o. female.   Patient presents for evaluation of urinary frequency, dysuria and hematuria beginning 1 day ago.  Has attempted use of Azo and Tylenol .  Denies abdominal pain, flank pain, fever or vaginal symptoms.  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 06/10/2022   DM (diabetes mellitus), type 2 (HCC) 06/10/2022   S/P distal gastrectomy/vagotomy with Roux-en-Y reconstruction - SHE HAS NOT HAD A GASTRIC BYPASS 12/08/2014   Pyloric stricture s/p vagotomy/antrectomy/Roux-enY 06/24/2014 05/04/2014   Hyperlipidemia associated with type 2 diabetes mellitus (HCC) 07/30/2006   Anxiety and depression 07/30/2006    Past Surgical History:  Procedure Laterality Date   ANTRECTOMY N/A 06/24/2014   Procedure: ROBOTIC DISTAL GASTRECTOMY;  Surgeon: Elspeth Schultze, MD;  Location: [facility];  Service: General;  Laterality: N/A;   COLONOSCOPY  AGE 62   COLONOSCOPY N/A 10/19/2014   Procedure: COLONOSCOPY;  Surgeon: Lupita FORBES Commander, MD;  Location: 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 Authorizing Provider  nitrofurantoin, macrocrystal-monohydrate, (MACROBID) 100 MG capsule Take 1 capsule (100 mg total) by mouth 2 (two) times daily. 01/12/24  Yes Elta Angell R, NP  acetaminophen  (TYLENOL ) 500 MG tablet Take 1,000 mg by mouth  every 6 (six) hours as needed for mild pain or fever (pain and fever). Reported on 05/17/2015    [provider]  ALPRAZolam  (XANAX ) 0.25 MG tablet Take 1 tablet (0.25 mg total) by mouth 2 (two) times daily as needed for anxiety. 01/01/24   Antonette Angeline ORN, NP  atorvastatin  (LIPITOR) 20 MG tablet TAKE 1 TABLET DAILY 11/24/23   Marylynn Verneita CROME, MD  FLUoxetine  (PROZAC ) 40 MG capsule TAKE 1 CAPSULE DAILY 11/24/23   Marylynn Verneita CROME, MD  omeprazole  (PRILOSEC) 40 MG capsule TAKE 1 CAPSULE AT BEDTIME 11/24/23   Marylynn Verneita CROME, MD    Family History Family History  Problem Relation Age of Onset   Diabetes Mother    Heart disease Father    Heart disease Brother        pacer/defibrillator   Diabetes Brother    Lung cancer Brother    Cancer - Lung  Brother    Diabetes Maternal Grandmother    Breast cancer Paternal Grandmother    Colon cancer Neg Hx     Social History Social History   Tobacco Use   Smoking status: Former    Current packs/day: 0.00    Average packs/day: 0.3 packs/day for 30.0 years (7.5 ttl pk-yrs)    Types: Cigarettes    Start date: 06/17/1984    Quit date: 06/18/2014    Years since quitting: 9.5   Smokeless tobacco: Never  Vaping Use   Vaping status: Some Days  Substance Use Topics   Alcohol use: Never   Drug use: Never     Allergies   Cefotetan , Naproxen, Nsaids, Aspirin, Ibuprofen, Toradol  [ketorolac  tromethamine ], Codeine sulfate, and Flagyl  [metronidazole ]   Review of Systems Review of Systems   Physical Exam Triage Vital Signs ED Triage Vitals [01/12/24 1227]  Encounter Vitals Group     BP 124/71     Girls Systolic BP Percentile      Girls Diastolic BP Percentile      Boys Systolic BP Percentile      Boys Diastolic BP Percentile      Pulse Rate 66     Resp 18     Temp 97.9 F (36.6 C)     Temp Source Oral     SpO2 96 %     Weight      Height      Head Circumference      Peak Flow      Pain Score      Pain Loc      Pain Education      Exclude from Growth Chart    No data found.  Updated Vital Signs BP 124/71 (BP Location: Left Arm)   Pulse 66   Temp 97.9 F (36.6 C) (Oral)   Resp 18   SpO2 96%   Visual Acuity Right Eye Distance:   Left Eye Distance:   Bilateral Distance:    Right Eye Near:   Left Eye Near:    Bilateral Near:     Physical Exam Constitutional:      Appearance: Normal appearance.  Eyes:     Extraocular Movements: Extraocular movements intact.  Abdominal:     Tenderness: There is no abdominal tenderness. There is no right CVA tenderness, left CVA tenderness or guarding.  Neurological:     Mental Status: She is alert and oriented to person, place, and time. Mental status is at baseline.      UC Treatments / Results  Labs (all labs ordered  are  listed, but only abnormal results are displayed) Labs Reviewed  POCT URINE DIPSTICK - Abnormal; Notable for the following components:      Result Value   Color, UA orange (*)    Clarity, UA cloudy (*)    Glucose, UA =250 (*)    Bilirubin, UA moderate (*)    Ketones, POC UA small (15) (*)    Blood, UA moderate (*)    POC PROTEIN,UA >=300 (*)    Urobilinogen, UA >=8.0 (*)    Nitrite, UA Positive (*)    Leukocytes, UA Large (3+) (*)    All other components within normal limits  URINE CULTURE    EKG   Radiology No results found.  Procedures Procedures (including critical care time)  Medications Ordered in UC Medications - No data to display  Initial Impression / Assessment and Plan / UC Course  I have reviewed the triage vital signs and the nursing notes.  Pertinent labs & imaging results that were available during my care of the patient were reviewed by me and considered in my medical decision making (see chart for details).  Acute Cystitis with hematuria, urinary frequency  Urinalysis showing leukocytes and nitrates, sent for culture, last culture available showing E. coli, antibiotic chosen from this list, prescribed Macrobid recommended over-the-counter medication and nonpharmacological supportive measures with follow-up as needed Final Clinical Impressions(s) / UC Diagnoses   Final diagnoses:  Urinary frequency  Acute cystitis with hematuria     Discharge Instructions      Your urinalysis shows [place] blood cells and nitrates which are indicative of infection, your urine will be sent to the lab to determine exactly which bacteria is present, if any changes need to be made to your medications you will be notified  Begin use of Macrobid twice daily for 5 days  You may use over-the-counter Azo to help minimize your symptoms until antibiotic removes bacteria, this medication will turn your urine orange  Increase your fluid intake through use of water  As  always practice good hygiene, wiping front to back and avoidance of scented vaginal products to prevent further irritation  If symptoms continue to persist after use of medication or recur please follow-up with urgent care or your primary doctor as needed    ED Prescriptions     Medication Sig Dispense Auth. Provider   nitrofurantoin, macrocrystal-monohydrate, (MACROBID) 100 MG capsule Take 1 capsule (100 mg total) by mouth 2 (two) times daily. 10 capsule Aaden Buckman R, NP      PDMP not reviewed this encounter.   Teresa Shelba SAUNDERS, NP 01/12/24 [phone]

## 2024-01-12 NOTE — Discharge Instructions (Addendum)
Your urinalysis shows [name] blood cells and nitrates which are indicative of infection, your urine will be sent to the lab to determine exactly which bacteria is present, if any changes need to be made to your medications you will be notified  Begin use of Macrobid twice daily for 5 days   You may use over-the-counter Azo to help minimize your symptoms until antibiotic removes bacteria, this medication will turn your urine orange  Increase your fluid intake through use of water  As always practice good hygiene, wiping front to back and avoidance of scented vaginal products to prevent further irritation  If symptoms continue to persist after use of medication or recur please follow-up with urgent care or your primary doctor as needed  

## 2024-01-14 ENCOUNTER — Ambulatory Visit (HOSPITAL_COMMUNITY): Payer: Self-pay

## 2024-01-14 LAB — URINE CULTURE: Culture: 30000 — AB

## 2024-01-27 ENCOUNTER — Encounter: Payer: Self-pay | Admitting: Internal Medicine

## 2024-01-27 MED ORDER — ALPRAZOLAM 0.25 MG PO TABS: 0.2500 mg | ORAL_TABLET | Freq: Two times a day (BID) | ORAL | 0 refills | Status: DC | PRN

## 2024-03-08 ENCOUNTER — Encounter: Payer: Self-pay | Admitting: Internal Medicine

## 2024-03-08 MED ORDER — ALPRAZOLAM 0.25 MG PO TABS: 0.2500 mg | ORAL_TABLET | Freq: Two times a day (BID) | ORAL | 0 refills | Status: DC | PRN

## 2024-03-30 ENCOUNTER — Encounter: Payer: Self-pay | Admitting: Internal Medicine

## 2024-03-30 MED ORDER — ALPRAZOLAM 0.25 MG PO TABS: 0.2500 mg | ORAL_TABLET | Freq: Two times a day (BID) | ORAL | 0 refills | Status: AC | PRN

## 2024-05-03 ENCOUNTER — Encounter: Admitting: Internal Medicine

## 2024-05-26 ENCOUNTER — Encounter: Admitting: Internal Medicine
